# Patient Record
Sex: Male | Born: 1950
Health system: Southern US, Community
[De-identification: ages and names within clinical notes are randomized; demographics above are authoritative.]

## PROBLEM LIST (undated history)

## (undated) DIAGNOSIS — E785 Hyperlipidemia, unspecified: Secondary | ICD-10-CM

## (undated) DIAGNOSIS — K219 Gastro-esophageal reflux disease without esophagitis: Secondary | ICD-10-CM

## (undated) DIAGNOSIS — H409 Unspecified glaucoma: Secondary | ICD-10-CM

## (undated) DIAGNOSIS — E6609 Other obesity due to excess calories: Secondary | ICD-10-CM

## (undated) DIAGNOSIS — Z923 Personal history of irradiation: Secondary | ICD-10-CM

## (undated) DIAGNOSIS — C9000 Multiple myeloma not having achieved remission: Secondary | ICD-10-CM

## (undated) DIAGNOSIS — R7303 Prediabetes: Secondary | ICD-10-CM

## (undated) DIAGNOSIS — L509 Urticaria, unspecified: Secondary | ICD-10-CM

## (undated) DIAGNOSIS — T783XXA Angioneurotic edema, initial encounter: Secondary | ICD-10-CM

## (undated) DIAGNOSIS — Z9989 Dependence on other enabling machines and devices: Secondary | ICD-10-CM

## (undated) HISTORY — DX: Other obesity due to excess calories: E66.09

## (undated) HISTORY — DX: Hyperlipidemia, unspecified: E78.5

## (undated) HISTORY — DX: Personal history of irradiation: Z92.3

## (undated) HISTORY — DX: Urticaria, unspecified: L50.9

## (undated) HISTORY — PX: PORTACATH PLACEMENT: SHX2246

## (undated) HISTORY — DX: Prediabetes: R73.03

## (undated) HISTORY — DX: Gastro-esophageal reflux disease without esophagitis: K21.9

## (undated) HISTORY — DX: Angioneurotic edema, initial encounter: T78.3XXA

## (undated) HISTORY — DX: Unspecified glaucoma: H40.9

## (undated) HISTORY — DX: Multiple myeloma not having achieved remission: C90.00

---

## 2010-10-17 DIAGNOSIS — C9000 Multiple myeloma not having achieved remission: Secondary | ICD-10-CM

## 2010-10-17 HISTORY — DX: Multiple myeloma not having achieved remission: C90.00

## 2016-04-04 DIAGNOSIS — E7800 Pure hypercholesterolemia, unspecified: Secondary | ICD-10-CM | POA: Diagnosis not present

## 2016-04-04 DIAGNOSIS — H409 Unspecified glaucoma: Secondary | ICD-10-CM | POA: Diagnosis not present

## 2016-04-04 DIAGNOSIS — R7303 Prediabetes: Secondary | ICD-10-CM | POA: Diagnosis not present

## 2016-04-04 DIAGNOSIS — R079 Chest pain, unspecified: Secondary | ICD-10-CM | POA: Diagnosis not present

## 2016-04-04 DIAGNOSIS — E6609 Other obesity due to excess calories: Secondary | ICD-10-CM | POA: Diagnosis not present

## 2016-04-04 DIAGNOSIS — T783XXD Angioneurotic edema, subsequent encounter: Secondary | ICD-10-CM | POA: Diagnosis not present

## 2016-04-04 DIAGNOSIS — Z6834 Body mass index (BMI) 34.0-34.9, adult: Secondary | ICD-10-CM | POA: Diagnosis not present

## 2016-04-04 DIAGNOSIS — L509 Urticaria, unspecified: Secondary | ICD-10-CM | POA: Diagnosis not present

## 2016-04-04 DIAGNOSIS — K219 Gastro-esophageal reflux disease without esophagitis: Secondary | ICD-10-CM | POA: Diagnosis not present

## 2016-04-04 DIAGNOSIS — C9000 Multiple myeloma not having achieved remission: Secondary | ICD-10-CM | POA: Diagnosis not present

## 2016-04-04 DIAGNOSIS — R197 Diarrhea, unspecified: Secondary | ICD-10-CM | POA: Diagnosis not present

## 2016-05-02 DIAGNOSIS — Z86010 Personal history of colonic polyps: Secondary | ICD-10-CM | POA: Diagnosis not present

## 2016-05-02 DIAGNOSIS — R194 Change in bowel habit: Secondary | ICD-10-CM | POA: Diagnosis not present

## 2016-05-11 ENCOUNTER — Encounter: Payer: Self-pay | Admitting: Cardiovascular Disease

## 2016-05-11 ENCOUNTER — Encounter (INDEPENDENT_AMBULATORY_CARE_PROVIDER_SITE_OTHER): Payer: Self-pay

## 2016-05-11 ENCOUNTER — Ambulatory Visit (INDEPENDENT_AMBULATORY_CARE_PROVIDER_SITE_OTHER): Payer: Medicare Other | Admitting: Cardiovascular Disease

## 2016-05-11 VITALS — BP 118/76 | HR 79 | Ht 73.0 in | Wt 262.4 lb

## 2016-05-11 DIAGNOSIS — R072 Precordial pain: Secondary | ICD-10-CM | POA: Diagnosis not present

## 2016-05-11 DIAGNOSIS — R011 Cardiac murmur, unspecified: Secondary | ICD-10-CM

## 2016-05-11 NOTE — Patient Instructions (Signed)
Medication Instructions:  Your physician recommends that you continue on your current medications as directed. Please refer to the Current Medication list given to you today.   Labwork: none  Testing/Procedures: Your physician has requested that you have an exercise tolerance test. For further information please visit [email or web address]. Please also follow instruction sheet, as given.  Your physician has requested that you have an echocardiogram. Echocardiography is a painless test that uses sound waves to create images of your heart. It provides your doctor with information about the size and shape of your heart and how well your heart's chambers and valves are working. This procedure takes approximately one hour. There are no restrictions for this procedure.    Follow-Up: Your physician wants you to follow-up in: 12 months.  You will receive a reminder letter in the mail two months in advance. If you don't receive a letter, please call our office to schedule the follow-up appointment.   Any Other Special Instructions Will Be Listed Below (If Applicable).     If you need a refill on your cardiac medications before your next appointment, please call your pharmacy.

## 2016-05-11 NOTE — Progress Notes (Signed)
Chief Complaint  Patient presents with  . Follow-up    pt states some chest pain no SOB     History of Present Illness: Michael [name] is a 65 yo male with history of GERD, HLD, pre-diabetes and multiple myeloma who is here today as a new patient for evaluation of chest pain. He has no known cardiac disease. He was diagnosed with Multiple myeloma in 2012 and was treated at the [facility]. Follow up PET scans with sternal lesion. He describes substernal chest pains over the last few months. This was worsened with movement of his arms and when bending over. This was not clearly exertional. It has resolved. He had been under much stress with his move from [place] to [place] recently. He is now retired. He is a non-smoker. He does not exercise.  His chest pain had no associated dyspnea, palpitations, dizziness, diaphoresis.   Primary Care Physician: [name]   Past Medical History:  Diagnosis Date  . Angioedema   . Gastroesophageal reflux disease without esophagitis   . Hyperlipidemia   . Multiple myeloma ([place]) 2012   Treated at [facility]  . Non morbid obesity due to excess calories   . Prediabetes   . Prediabetes   . Unspecified glaucoma   . Urticaria, unspecified     Past Surgical History:  Procedure Laterality Date  . PORTACATH PLACEMENT      Current Outpatient Prescriptions  Medication Sig Dispense Refill  . aspirin 81 MG tablet Take 81 mg by mouth daily.    [name] atorvastatin (LIPITOR) 20 MG tablet Take 20 mg by mouth daily.    . Calcium Carbonate-Vitamin D (CALCIUM-D) 600-400 MG-UNIT TABS Take by mouth 2 (two) times daily.    . pantoprazole (PROTONIX) 40 MG tablet Take 40 mg by mouth daily.     No current facility-administered medications for this visit.     No Known Allergies  Social History   Social History  . Marital status: Married    Spouse name: N/A  . Number of children: 2  . Years of education: N/A   Occupational History  . Retired Chief Strategy Officer for [employer]  . Smoking status: Never Smoker  . Smokeless tobacco: Not on file  . Alcohol use 0.0 oz/week     Comment: Occasional beer  . Drug use: No  . Sexual activity: Not on file   Other Topics Concern  . Not on file   Social History Narrative  . No narrative on file    Family History  Problem Relation Age of Onset  . Cancer Father     Review of Systems:  As stated in the HPI and otherwise negative.   BP 118/76 (BP Location: Right Arm, Patient Position: Sitting, Cuff Size: Normal)   Pulse 79   Ht _0  (1.854 m)   Wt 262 lb 6.4 oz (119 kg)   BMI 34.62 kg/m   Physical Examination: General: Well developed, well nourished, NAD  HEENT: OP clear, mucus membranes moist  SKIN: warm, dry. No rashes. Neuro: No focal deficits  Musculoskeletal: Muscle strength 5/5 all ext  Psychiatric: Mood and affect normal  Neck: No JVD, no carotid bruits, no thyromegaly, no lymphadenopathy.  Lungs:Clear bilaterally, no wheezes, rhonci, crackles Cardiovascular: Regular rate and rhythm with soft systolic murmurs. No gallops or rubs. Abdomen:Soft. Bowel sounds present. Non-tender.  Extremities: No lower extremity edema. Pulses are 2 + in the bilateral DP/PT.  EKG:  EKG is not ordered  today. The ekg ordered today demonstrates   Recent Labs: No results found for requested labs within last 8760 hours.   Lipid Panel No results found for: CHOL, TRIG, HDL, CHOLHDL, VLDL, LDLCALC, LDLDIRECT   Wt Readings from Last 3 Encounters:  05/11/16 262 lb 6.4 oz (119 kg)     Other studies Reviewed: Additional studies/ records that were reviewed today include: . Review of the above records demonstrates:     Assessment and Plan:   1. Chest pain: His pain sounds musculoskeletal and has resolved. Given risk factors for CAD including age, pre-diabetes, HLD, will plan exercise treadmill stress test to assess for ischemia.   2. Cardiac murmur: Will arrange echo to assess, exclude valvular  disease.   Current medicines are reviewed at length with the patient today.  The patient does not have concerns regarding medicines.  The following changes have been made:  no change  Labs/ tests ordered today include:  No orders of the defined types were placed in this encounter.    Disposition:   FU with me in 12 months   Signed, Lauree Chandler, MD 05/11/2016 10:08 AM    [facility] [address], [place], [place]  [address] Phone: [phone]; Fax: [phone]

## 2016-05-25 ENCOUNTER — Encounter: Payer: Self-pay | Admitting: Cardiovascular Disease

## 2016-05-26 ENCOUNTER — Ambulatory Visit (INDEPENDENT_AMBULATORY_CARE_PROVIDER_SITE_OTHER): Payer: Medicare Other

## 2016-05-26 ENCOUNTER — Ambulatory Visit (HOSPITAL_COMMUNITY): Payer: Medicare Other | Attending: Cardiovascular Disease

## 2016-05-26 ENCOUNTER — Other Ambulatory Visit: Payer: Self-pay

## 2016-05-26 DIAGNOSIS — E669 Obesity, unspecified: Secondary | ICD-10-CM | POA: Insufficient documentation

## 2016-05-26 DIAGNOSIS — R011 Cardiac murmur, unspecified: Secondary | ICD-10-CM | POA: Diagnosis not present

## 2016-05-26 DIAGNOSIS — I517 Cardiomegaly: Secondary | ICD-10-CM | POA: Diagnosis not present

## 2016-05-26 DIAGNOSIS — R072 Precordial pain: Secondary | ICD-10-CM | POA: Diagnosis not present

## 2016-05-26 DIAGNOSIS — E785 Hyperlipidemia, unspecified: Secondary | ICD-10-CM | POA: Insufficient documentation

## 2016-05-26 DIAGNOSIS — Z6834 Body mass index (BMI) 34.0-34.9, adult: Secondary | ICD-10-CM | POA: Diagnosis not present

## 2016-05-26 DIAGNOSIS — I351 Nonrheumatic aortic (valve) insufficiency: Secondary | ICD-10-CM | POA: Diagnosis not present

## 2016-05-26 LAB — EXERCISE TOLERANCE TEST
CSEPED: 6 min
CSEPPHR: 136 {beats}/min
Estimated workload: 7 METS
Exercise duration (sec): 0 s
MPHR: 154 {beats}/min
Percent HR: 88 %
RPE: 18
Rest HR: 64 {beats}/min

## 2016-06-06 DIAGNOSIS — R194 Change in bowel habit: Secondary | ICD-10-CM | POA: Diagnosis not present

## 2016-06-06 DIAGNOSIS — D125 Benign neoplasm of sigmoid colon: Secondary | ICD-10-CM | POA: Diagnosis not present

## 2016-06-06 DIAGNOSIS — K644 Residual hemorrhoidal skin tags: Secondary | ICD-10-CM | POA: Diagnosis not present

## 2016-06-06 DIAGNOSIS — Z86010 Personal history of colonic polyps: Secondary | ICD-10-CM | POA: Diagnosis not present

## 2016-06-06 DIAGNOSIS — K5730 Diverticulosis of large intestine without perforation or abscess without bleeding: Secondary | ICD-10-CM | POA: Diagnosis not present

## 2016-06-06 DIAGNOSIS — D122 Benign neoplasm of ascending colon: Secondary | ICD-10-CM | POA: Diagnosis not present

## 2016-06-06 DIAGNOSIS — R197 Diarrhea, unspecified: Secondary | ICD-10-CM | POA: Diagnosis not present

## 2016-06-06 DIAGNOSIS — D126 Benign neoplasm of colon, unspecified: Secondary | ICD-10-CM | POA: Diagnosis not present

## 2016-07-06 DIAGNOSIS — R7303 Prediabetes: Secondary | ICD-10-CM | POA: Diagnosis not present

## 2016-07-06 DIAGNOSIS — E7800 Pure hypercholesterolemia, unspecified: Secondary | ICD-10-CM | POA: Diagnosis not present

## 2016-07-06 DIAGNOSIS — Z23 Encounter for immunization: Secondary | ICD-10-CM | POA: Diagnosis not present

## 2016-07-06 DIAGNOSIS — Z1159 Encounter for screening for other viral diseases: Secondary | ICD-10-CM | POA: Diagnosis not present

## 2016-07-06 DIAGNOSIS — K219 Gastro-esophageal reflux disease without esophagitis: Secondary | ICD-10-CM | POA: Diagnosis not present

## 2016-07-06 DIAGNOSIS — R748 Abnormal levels of other serum enzymes: Secondary | ICD-10-CM | POA: Diagnosis not present

## 2016-07-06 DIAGNOSIS — Z86010 Personal history of colonic polyps: Secondary | ICD-10-CM | POA: Diagnosis not present

## 2016-07-06 DIAGNOSIS — E6609 Other obesity due to excess calories: Secondary | ICD-10-CM | POA: Diagnosis not present

## 2016-07-06 DIAGNOSIS — C9000 Multiple myeloma not having achieved remission: Secondary | ICD-10-CM | POA: Diagnosis not present

## 2016-07-06 DIAGNOSIS — Z6836 Body mass index (BMI) 36.0-36.9, adult: Secondary | ICD-10-CM | POA: Diagnosis not present

## 2016-07-18 DIAGNOSIS — M25562 Pain in left knee: Secondary | ICD-10-CM | POA: Diagnosis not present

## 2016-07-18 DIAGNOSIS — M25561 Pain in right knee: Secondary | ICD-10-CM | POA: Diagnosis not present

## 2016-07-18 DIAGNOSIS — M170 Bilateral primary osteoarthritis of knee: Secondary | ICD-10-CM | POA: Diagnosis not present

## 2016-07-18 DIAGNOSIS — R262 Difficulty in walking, not elsewhere classified: Secondary | ICD-10-CM | POA: Diagnosis not present

## 2016-07-21 DIAGNOSIS — M170 Bilateral primary osteoarthritis of knee: Secondary | ICD-10-CM | POA: Diagnosis not present

## 2016-07-21 DIAGNOSIS — R262 Difficulty in walking, not elsewhere classified: Secondary | ICD-10-CM | POA: Diagnosis not present

## 2016-07-21 DIAGNOSIS — M25561 Pain in right knee: Secondary | ICD-10-CM | POA: Diagnosis not present

## 2016-07-21 DIAGNOSIS — M25562 Pain in left knee: Secondary | ICD-10-CM | POA: Diagnosis not present

## 2016-07-26 DIAGNOSIS — M25561 Pain in right knee: Secondary | ICD-10-CM | POA: Diagnosis not present

## 2016-07-26 DIAGNOSIS — M1711 Unilateral primary osteoarthritis, right knee: Secondary | ICD-10-CM | POA: Diagnosis not present

## 2016-07-28 DIAGNOSIS — M1712 Unilateral primary osteoarthritis, left knee: Secondary | ICD-10-CM | POA: Diagnosis not present

## 2016-07-28 DIAGNOSIS — M25562 Pain in left knee: Secondary | ICD-10-CM | POA: Diagnosis not present

## 2016-08-02 DIAGNOSIS — M1711 Unilateral primary osteoarthritis, right knee: Secondary | ICD-10-CM | POA: Diagnosis not present

## 2016-08-02 DIAGNOSIS — M25561 Pain in right knee: Secondary | ICD-10-CM | POA: Diagnosis not present

## 2016-08-04 DIAGNOSIS — M25562 Pain in left knee: Secondary | ICD-10-CM | POA: Diagnosis not present

## 2016-08-04 DIAGNOSIS — M1712 Unilateral primary osteoarthritis, left knee: Secondary | ICD-10-CM | POA: Diagnosis not present

## 2016-08-09 DIAGNOSIS — M1711 Unilateral primary osteoarthritis, right knee: Secondary | ICD-10-CM | POA: Diagnosis not present

## 2016-08-09 DIAGNOSIS — M25561 Pain in right knee: Secondary | ICD-10-CM | POA: Diagnosis not present

## 2016-08-11 DIAGNOSIS — M1712 Unilateral primary osteoarthritis, left knee: Secondary | ICD-10-CM | POA: Diagnosis not present

## 2016-08-11 DIAGNOSIS — M25562 Pain in left knee: Secondary | ICD-10-CM | POA: Diagnosis not present

## 2016-08-16 DIAGNOSIS — M170 Bilateral primary osteoarthritis of knee: Secondary | ICD-10-CM | POA: Diagnosis not present

## 2016-08-16 DIAGNOSIS — M25561 Pain in right knee: Secondary | ICD-10-CM | POA: Diagnosis not present

## 2016-08-16 DIAGNOSIS — M25562 Pain in left knee: Secondary | ICD-10-CM | POA: Diagnosis not present

## 2016-10-12 DIAGNOSIS — R7303 Prediabetes: Secondary | ICD-10-CM | POA: Diagnosis not present

## 2016-12-01 DIAGNOSIS — S0101XA Laceration without foreign body of scalp, initial encounter: Secondary | ICD-10-CM | POA: Diagnosis not present

## 2016-12-05 DIAGNOSIS — S0101XD Laceration without foreign body of scalp, subsequent encounter: Secondary | ICD-10-CM | POA: Diagnosis not present

## 2016-12-05 DIAGNOSIS — W19XXXD Unspecified fall, subsequent encounter: Secondary | ICD-10-CM | POA: Diagnosis not present

## 2016-12-12 DIAGNOSIS — W19XXXA Unspecified fall, initial encounter: Secondary | ICD-10-CM | POA: Diagnosis not present

## 2016-12-12 DIAGNOSIS — C9000 Multiple myeloma not having achieved remission: Secondary | ICD-10-CM | POA: Diagnosis not present

## 2016-12-12 DIAGNOSIS — S0101XA Laceration without foreign body of scalp, initial encounter: Secondary | ICD-10-CM | POA: Diagnosis not present

## 2016-12-15 DIAGNOSIS — M170 Bilateral primary osteoarthritis of knee: Secondary | ICD-10-CM | POA: Diagnosis not present

## 2016-12-15 DIAGNOSIS — M25562 Pain in left knee: Secondary | ICD-10-CM | POA: Diagnosis not present

## 2016-12-15 DIAGNOSIS — M25561 Pain in right knee: Secondary | ICD-10-CM | POA: Diagnosis not present

## 2016-12-22 DIAGNOSIS — G4733 Obstructive sleep apnea (adult) (pediatric): Secondary | ICD-10-CM | POA: Diagnosis not present

## 2017-01-10 ENCOUNTER — Telehealth: Payer: Self-pay | Admitting: Hematology

## 2017-01-10 NOTE — Telephone Encounter (Signed)
Pt has been scheduled to see Dr. Irene Limbo on 4/16 at 1230pm. Pt brought his records to me this afternoon. He was dx w/myeloma and states that he's currently in remission. I advised him to hold onto the CDs w/the images and bring them to his appt. I will copy the pt's records in order for them to be scanned into his chart and give back the originals. Pt aware to arrive 15 to 30 minutes early. Demographic information was verified.

## 2017-01-12 DIAGNOSIS — H401131 Primary open-angle glaucoma, bilateral, mild stage: Secondary | ICD-10-CM | POA: Diagnosis not present

## 2017-01-30 ENCOUNTER — Encounter: Payer: Self-pay | Admitting: Hematology

## 2017-01-30 ENCOUNTER — Ambulatory Visit (HOSPITAL_BASED_OUTPATIENT_CLINIC_OR_DEPARTMENT_OTHER): Payer: Medicare Other | Admitting: Hematology

## 2017-01-30 DIAGNOSIS — Z809 Family history of malignant neoplasm, unspecified: Secondary | ICD-10-CM | POA: Diagnosis not present

## 2017-01-30 DIAGNOSIS — C9000 Multiple myeloma not having achieved remission: Secondary | ICD-10-CM

## 2017-02-01 NOTE — Progress Notes (Signed)
[name]    HEMATOLOGY/ONCOLOGY CONSULTATION NOTE  Date of Service: 02/01/2017  Patient Care Team: [name], MD as PCP - General (Family Medicine) [name] MD ([facility] _ primary oncology) phone number [phone] Email- [email or web address]  CHIEF COMPLAINTS/PURPOSE OF CONSULTATION:  Establish local oncology care for followup of multiple myeloma.   HISTORY OF PRESENTING ILLNESS:   Michael [name] is a wonderful 66 y.o. male , retired Nurse, learning disability guard who has been referred to [place] by Dr .Tawanna Solo, MD for establishing local oncology care "In case needed for treatment/management of complications pertaining to myeloma"  Patient has a history of multiple myeloma and is currently being followed [facility] by Dr.[name] MD who is his primary oncologist. Patient is currently on a study protocol and is being monitored for myeloma recurrence.  Based on available records being put together  -Patient was diagnosed with IgG kappa ISS stage I multiple myeloma with hyperdiploid complex cytogenetics in 2012. Bone marrow biopsy apparently showed 50% kappa restricted plasma cells with an initial M spike of 3.4 g/dL with evidence of lytic lesions on his PET/CT scan including right rib fracture and lesions of the lumbar and cervical spine. -Patient was treated under protocol 11-C-0221 with from 02/21/2013 with  Carfilzomib/Revlimid/Dexamethasone for a total of 8 cycles. He reports that his stem cells were collected and stored after 4 cycles -Posttreatment bone marrow biopsy showed less than 5% plasma cells with a negative flow cytometry and improvement in his previously PET avid lesions. Some concern for new focus of activity at C2 lytic lesion at L4 and several small lesions throughout the vertebrae. -Patient was on maintenance lenalidomide 10 mg by mouth daily for 2 years or more until end of 2014. 08/19/2013 - bone marrow biopsy showed normocellular marrow with less  than 5% plasma cells. PET CT scan showed decreased focal metabolic ligament prior rib lesions and no new lesions. Flow cytometry was negative for residual disease. Serum and urine electrophoresis and IFE showed no evidence of monoclonal protein. -Patient notes that he was off protocol for a few years due to lack of clinical trial research funding. -Patient notes that he is back on a follow-up protocol and continues to follow and receive his primary treatment at [facility] at this time. -Patient reports he had his last PET CT scan blood and urine tests and bone marrow examination in February 2018. The results of which are not available to [place] currently.  He notes that there was concern for a very slowly growing sternal FDG avid lesion that has been present for years. He has a follow-up at [facility] next month to determine the management of this lesion. He reports that was some consideration of considering radiation. he notes that this lesion has not been biopsied. No other focal bone pains at this time.  We discussed in detail about what our role will be and noted that we shall be available for any acute issues that need to be taken care of locally but that at this time the primary treatment and evaluation will be driven by his team at [facility] as per their research protocols and input. The patient and his wife are clearly aware of this and are in agreement with this plan.    MEDICAL HISTORY:  Past Medical History:  Diagnosis Date  . Angioedema   . Gastroesophageal reflux disease without esophagitis   . Hyperlipidemia   . Multiple myeloma ([place]) 2012   Treated at [facility]  . Non morbid obesity due to excess  calories   . Prediabetes   . Prediabetes   . Unspecified glaucoma(365.9)   . Urticaria, unspecified   Diabetes mellitus Glaucoma Left toes neuropathy GERD  SURGICAL HISTORY: Past Surgical History:  Procedure Laterality Date  . PORTACATH PLACEMENT      SOCIAL HISTORY: Social History    Social History  . Marital status: Married    Spouse name: N/A  . Number of children: 2  . Years of education: N/A   Occupational History  . 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 Smoking status: Never Smoker  . Smokeless tobacco: Never Used  . Alcohol use 0.0 oz/week     Comment: Occasional beer  . Drug use: No  . Sexual activity: Not on file   Other Topics Concern  . Not on file   Social History Narrative  . No narrative on file  Never smoker Married Retired from the [employer] in June 2017 and moved to [facility].  FAMILY HISTORY: Family History  Problem Relation Age of Onset  . Cancer Father     ALLERGIES:  has No Known Allergies.  MEDICATIONS:  Current Outpatient Prescriptions  Medication Sig Dispense Refill  . aspirin 81 MG tablet Take 81 mg by mouth daily.    [name] atorvastatin (LIPITOR) 20 MG tablet Take 20 mg by mouth daily.    . Calcium Carbonate-Vitamin D (CALCIUM-D) 600-400 MG-UNIT TABS Take by mouth 2 (two) times daily.    . pantoprazole (PROTONIX) 40 MG tablet Take 40 mg by mouth daily.     No current facility-administered medications for this visit.     REVIEW OF SYSTEMS:    10 Point review of Systems was done is negative except as noted above.  PHYSICAL EXAMINATION: ECOG PERFORMANCE STATUS: 1 - Symptomatic but completely ambulatory Vital signs reviewed in EPIC GENERAL:alert, in no acute distress and comfortable SKIN: no acute rashes, no significant lesions EYES: conjunctiva are pink and non-injected, sclera anicteric OROPHARYNX: MMM, no exudates, no oropharyngeal erythema or ulceration NECK: supple, no JVD LYMPH:  no palpable lymphadenopathy in the cervical, axillary or inguinal regions LUNGS: clear to auscultation b/l with normal respiratory effort HEART: regular rate & rhythm ABDOMEN:  Obese, normoactive bowel sounds , non tender, not distended. No palpable hepatosplenomegaly Extremity: no pedal  edema PSYCH: alert & oriented x 3 with fluent speech NEURO: no focal motor/sensory deficits  LABORATORY DATA:  I have reviewed the data as listed  .No flowsheet data found.  .No flowsheet data found.   RADIOGRAPHIC STUDIES: I have personally reviewed the radiological images as listed and agreed with the findings in the report. No results found.  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 Bone marrow biopsy apparently showed 50% kappa restricted plasma cells with an initial M spike of 3.4 g/dL with evidence of lytic lesions on his PET/CT scan including right rib fracture and lesions of the lumbar and cervical spine. -Patient was treated under protocol 11-C-0221 with from 02/21/2013 with  Carfilzomib/Revlimid/Dexamethasone for a total of 8 cycles. He reports that his stem cells were collected and stored after 4 cycles -Posttreatment bone marrow biopsy showed less than 5% plasma cells with a negative flow cytometry and improvement in his previously PET avid lesions. Some concern for new focus of activity at C2 lytic lesion  at L4 and several small lesions throughout the vertebrae. -Patient was on maintenance lenalidomide 10 mg by mouth daily for 2 years or more until end of 2014. 08/19/2013 - bone marrow biopsy showed normocellular marrow with less than 5% plasma cells. PET CT scan showed decreased focal metabolic ligament prior rib lesions and no new lesions. Flow cytometry was negative for residual disease. Serum and urine electrophoresis and IFE showed no evidence of monoclonal protein. -Patient notes that he was off protocol for a few years due to lack of clinical trial research funding. -Patient notes that he is back on a follow-up protocol and continues to follow and receive his primary treatment at [facility] at this  time.  Plan -Patient reports he had his last PET CT scan blood and urine tests and bone marrow examination in February 2018. The results of which are not available to [place] currently. I have asked by RN to try to obtained these results.  -He notes that there was concern for a very slowly growing sternal FDG avid lesion that has been present for years. He has a follow-up at [facility] next month to determine the management of this lesion. He reports that was some consideration of considering radiation. he notes that this lesion has not been biopsied. No other focal bone pains at this time.  -We discussed in detail about what our role will be and noted that we shall be available for any acute issues that need to be taken care of locally but that at this time the primary treatment and evaluation will be driven by his team at [facility] as per their research protocols and input. The patient and his wife are clearly aware of this and are in agreement with this plan. They are aware that they would need to inform me/our clinic if they decide to pursue active treatment locally off proctocol.  -we will try to get in touch with his primary oncologist at [facility]  [facility] with Dr Irene Limbo on an as needed basis Primary oncologic cares to continue at [facility] with Dr Jaclyn Prime MD  All of the patients questions were answered with apparent satisfaction. The patient knows to call the clinic with any problems, questions or concerns.  I spent 55 minutes counseling the patient face to face. The total time spent in the appointment was 65 minutes and more than 50% was on counseling and direct patient cares.    Sullivan Lone MD [place] AAHIVMS [facility] Hematology/Oncology Physician [facility]  (Office):       [phone] (Work cell):  [phone] (Fax):           [phone]  .01/30/2017

## 2017-03-06 ENCOUNTER — Other Ambulatory Visit: Payer: Self-pay | Admitting: Hematology

## 2017-03-06 DIAGNOSIS — C9000 Multiple myeloma not having achieved remission: Secondary | ICD-10-CM

## 2017-03-07 ENCOUNTER — Telehealth: Payer: Self-pay | Admitting: Hematology

## 2017-03-07 NOTE — Telephone Encounter (Signed)
Scheduled appt per sch message from RN loren - patient aware of appt time and date.

## 2017-03-15 DIAGNOSIS — R7303 Prediabetes: Secondary | ICD-10-CM | POA: Diagnosis not present

## 2017-03-15 DIAGNOSIS — E7800 Pure hypercholesterolemia, unspecified: Secondary | ICD-10-CM | POA: Diagnosis not present

## 2017-03-20 ENCOUNTER — Other Ambulatory Visit (HOSPITAL_BASED_OUTPATIENT_CLINIC_OR_DEPARTMENT_OTHER): Payer: Medicare Other

## 2017-03-20 DIAGNOSIS — Z6834 Body mass index (BMI) 34.0-34.9, adult: Secondary | ICD-10-CM | POA: Diagnosis not present

## 2017-03-20 DIAGNOSIS — G4733 Obstructive sleep apnea (adult) (pediatric): Secondary | ICD-10-CM | POA: Diagnosis not present

## 2017-03-20 DIAGNOSIS — E7800 Pure hypercholesterolemia, unspecified: Secondary | ICD-10-CM | POA: Diagnosis not present

## 2017-03-20 DIAGNOSIS — C9000 Multiple myeloma not having achieved remission: Secondary | ICD-10-CM | POA: Diagnosis not present

## 2017-03-20 DIAGNOSIS — E6609 Other obesity due to excess calories: Secondary | ICD-10-CM | POA: Diagnosis not present

## 2017-03-20 DIAGNOSIS — E119 Type 2 diabetes mellitus without complications: Secondary | ICD-10-CM | POA: Diagnosis not present

## 2017-03-20 DIAGNOSIS — L509 Urticaria, unspecified: Secondary | ICD-10-CM | POA: Diagnosis not present

## 2017-03-20 DIAGNOSIS — Z23 Encounter for immunization: Secondary | ICD-10-CM | POA: Diagnosis not present

## 2017-03-20 DIAGNOSIS — K219 Gastro-esophageal reflux disease without esophagitis: Secondary | ICD-10-CM | POA: Diagnosis not present

## 2017-03-20 LAB — COMPREHENSIVE METABOLIC PANEL
ALBUMIN: 4 g/dL (ref 3.5–5.0)
ALK PHOS: 79 U/L (ref 40–150)
ALT: 24 U/L (ref 0–55)
ANION GAP: 8 meq/L (ref 3–11)
AST: 17 U/L (ref 5–34)
BUN: 14.3 mg/dL (ref 7.0–26.0)
CALCIUM: 9.3 mg/dL (ref 8.4–10.4)
CHLORIDE: 106 meq/L (ref 98–109)
CO2: 27 mEq/L (ref 22–29)
CREATININE: 0.8 mg/dL (ref 0.7–1.3)
EGFR: >90 mL/min/{1.73_m2} (ref >90)
Glucose: 117 mg/dl (ref 70–140)
POTASSIUM: 4.1 meq/L (ref 3.5–5.1)
Sodium: 141 mEq/L (ref 136–145)
Total Bilirubin: 1 mg/dL (ref 0.20–1.20)
Total Protein: 6.6 g/dL (ref 6.4–8.3)

## 2017-03-20 LAB — CBC & DIFF AND RETIC
BASO%: 0.5 % (ref 0.0–2.0)
Basophils Absolute: 0 10*3/uL (ref 0.0–0.1)
EOS ABS: 0.1 10*3/uL (ref 0.0–0.5)
EOS%: 2 % (ref 0.0–7.0)
HEMATOCRIT: 40.2 % (ref 38.4–49.9)
HEMOGLOBIN: 13.6 g/dL (ref 13.0–17.1)
Immature Retic Fract: 2.3 % — ABNORMAL LOW (ref 3.00–10.60)
LYMPH%: 32.7 % (ref 14.0–49.0)
MCH: 30.8 pg (ref 27.2–33.4)
MCHC: 33.8 g/dL (ref 32.0–36.0)
MCV: 91 fL (ref 79.3–98.0)
MONO#: 0.4 10*3/uL (ref 0.1–0.9)
MONO%: 9.7 % (ref 0.0–14.0)
NEUT#: 2.2 10*3/uL (ref 1.5–6.5)
NEUT%: 55.1 % (ref 39.0–75.0)
PLATELETS: 161 10*3/uL (ref 140–400)
RBC: 4.42 10*6/uL (ref 4.20–5.82)
RDW: 13.4 % (ref 11.0–14.6)
Retic %: 1.11 % (ref 0.80–1.80)
Retic Ct Abs: 49.06 10*3/uL (ref 34.80–93.90)
WBC: 3.9 10*3/uL — ABNORMAL LOW (ref 4.0–10.3)
lymph#: 1.3 10*3/uL (ref 0.9–3.3)

## 2017-03-20 LAB — LACTATE DEHYDROGENASE: LDH: 170 U/L (ref 125–245)

## 2017-03-21 ENCOUNTER — Other Ambulatory Visit: Payer: Medicare Other

## 2017-03-21 LAB — KAPPA/LAMBDA LIGHT CHAINS
Ig Kappa Free Light Chain: 13.2 mg/L (ref 3.3–19.4)
Ig Lambda Free Light Chain: 7 mg/L (ref 5.7–26.3)
Kappa/Lambda FluidC Ratio: 1.89 — ABNORMAL HIGH (ref 0.26–1.65)

## 2017-03-21 LAB — BETA 2 MICROGLOBULIN, SERUM: BETA 2: 1.4 mg/L (ref 0.6–2.4)

## 2017-03-22 LAB — MULTIPLE MYELOMA PANEL, SERUM
ALBUMIN SERPL ELPH-MCNC: 3.7 g/dL (ref 2.9–4.4)
ALPHA 1: 0.2 g/dL (ref 0.0–0.4)
Albumin/Glob SerPl: 1.5 (ref 0.7–1.7)
Alpha2 Glob SerPl Elph-Mcnc: 0.6 g/dL (ref 0.4–1.0)
B-Globulin SerPl Elph-Mcnc: 1 g/dL (ref 0.7–1.3)
GLOBULIN, TOTAL: 2.6 g/dL (ref 2.2–3.9)
Gamma Glob SerPl Elph-Mcnc: 0.8 g/dL (ref 0.4–1.8)
IGA/IMMUNOGLOBULIN A, SERUM: 112 mg/dL (ref 61–437)
IGM (IMMUNOGLOBIN M), SRM: 36 mg/dL (ref 20–172)
IgG, Qn, Serum: 815 mg/dL (ref 700–1600)
M Protein SerPl Elph-Mcnc: 0.3 g/dL — ABNORMAL HIGH
Total Protein: 6.3 g/dL (ref 6.0–8.5)

## 2017-03-28 ENCOUNTER — Telehealth: Payer: Self-pay | Admitting: Hematology

## 2017-03-28 ENCOUNTER — Ambulatory Visit (HOSPITAL_BASED_OUTPATIENT_CLINIC_OR_DEPARTMENT_OTHER): Payer: Medicare Other | Admitting: Hematology

## 2017-03-28 ENCOUNTER — Other Ambulatory Visit: Payer: Medicare Other

## 2017-03-28 ENCOUNTER — Other Ambulatory Visit: Payer: Self-pay | Admitting: *Deleted

## 2017-03-28 ENCOUNTER — Encounter: Payer: Self-pay | Admitting: Hematology

## 2017-03-28 VITALS — BP 128/68 | HR 63 | Temp 98.4°F | Resp 18 | Ht 73.0 in | Wt 257.7 lb

## 2017-03-28 DIAGNOSIS — M170 Bilateral primary osteoarthritis of knee: Secondary | ICD-10-CM | POA: Diagnosis not present

## 2017-03-28 DIAGNOSIS — C9000 Multiple myeloma not having achieved remission: Secondary | ICD-10-CM

## 2017-03-28 DIAGNOSIS — C9002 Multiple myeloma in relapse: Secondary | ICD-10-CM

## 2017-03-28 MED ORDER — LENALIDOMIDE 10 MG PO CAPS: 10.0000 mg | ORAL_CAPSULE | Freq: Every day | ORAL | 0 refills | Status: DC

## 2017-03-28 MED ORDER — LENALIDOMIDE 10 MG PO CAPS: 10.0000 mg | ORAL_CAPSULE | Freq: Every day | ORAL | 3 refills | Status: DC

## 2017-03-28 NOTE — Telephone Encounter (Signed)
Scheduled appt per 6/12 los - called - no answer- mailed reminder letter in the mail with appt dates.

## 2017-03-28 NOTE — Patient Instructions (Signed)
Thank you for choosing [facility] to provide your oncology and hematology care.  To afford each patient quality time with our providers, please arrive 30 minutes before your scheduled appointment time.  If you arrive late for your appointment, you may be asked to reschedule.  We strive to give you quality time with our providers, and arriving late affects you and other patients whose appointments are after yours.  If you are a no show for multiple scheduled visits, you may be dismissed from the clinic at the providers discretion.   Again, thank you for choosing [facility], our hope is that these requests will decrease the amount of time that you wait before being seen by our physicians.  ______________________________________________________________________ Should you have questions after your visit to the [facility], please contact our office at [phone] between the hours of 8:30 and 4:30 p.m.    Voicemails left after 4:30p.m will not be returned until the following business day.   For prescription refill requests, please have your pharmacy contact us directly.  Please also try to allow 48 hours for prescription requests.   Please contact the scheduling department for questions regarding scheduling.  For scheduling of procedures such as PET scans, CT scans, MRI, Ultrasound, etc please contact central scheduling at [phone].   Resources For Cancer Patients and Caregivers:  American Cancer Society:  800-227-2345  Can help patients locate various types of support and financial assistance Cancer Care: 1-800-813-HOPE (4673) Provides financial assistance, online support groups, medication/co-pay assistance.   [place] DSS:  336-641-3447 Where to apply for food stamps, Medicaid, and utility assistance Medicare Rights Center: 800-333-4114 Helps people with Medicare understand their rights and benefits, navigate the Medicare system, and secure the  quality healthcare they deserve SCAT: 336-333-6589 [place] [employer]'s shared-ride transportation service for eligible riders who have a disability that prevents them from riding the fixed route bus.   For additional information on assistance programs please contact our social worker:   Grier Hock/Abigail Elmore:  [phone] 

## 2017-03-29 DIAGNOSIS — M25561 Pain in right knee: Secondary | ICD-10-CM | POA: Diagnosis not present

## 2017-03-29 DIAGNOSIS — M25562 Pain in left knee: Secondary | ICD-10-CM | POA: Diagnosis not present

## 2017-04-04 ENCOUNTER — Other Ambulatory Visit: Payer: Medicare Other

## 2017-04-04 ENCOUNTER — Ambulatory Visit: Payer: Medicare Other | Admitting: Hematology

## 2017-04-05 ENCOUNTER — Telehealth: Payer: Self-pay | Admitting: Pharmacist

## 2017-04-05 DIAGNOSIS — C9002 Multiple myeloma in relapse: Secondary | ICD-10-CM

## 2017-04-05 MED ORDER — LENALIDOMIDE 10 MG PO CAPS: 10.0000 mg | ORAL_CAPSULE | Freq: Every day | ORAL | 0 refills | Status: DC

## 2017-04-05 NOTE — Telephone Encounter (Signed)
Oral Chemotherapy Pharmacist Encounter  Received new prescription for Revlimid for the treatment of Multiple Myeloma in relapse. Labs and medication list in Epic reviewed, OK for treatment. Noted patient on thromboprophylaxis with aspirin.  Noted patient with [facility]. Prescription has been e-scribed to the [place] per insurance for best copayment.  Oral Oncology Clinic will continue to follow.  Johny Drilling, PharmD, BCPS, BCOP 04/05/2017  3:20 PM Oral Oncology Clinic [phone]

## 2017-04-07 ENCOUNTER — Telehealth: Payer: Self-pay | Admitting: *Deleted

## 2017-04-07 DIAGNOSIS — C9002 Multiple myeloma in relapse: Secondary | ICD-10-CM

## 2017-04-07 MED ORDER — LENALIDOMIDE 10 MG PO CAPS: 10.0000 mg | ORAL_CAPSULE | Freq: Every day | ORAL | 0 refills | Status: DC

## 2017-04-07 NOTE — Telephone Encounter (Signed)
FYI "[place] with Biochemist, clinical.  Following up on order for Revlimid for this patient.  [facility] received order, unable to service, sent order to [place].  We're unable to fill this order because we do not work with [employer].  Tried, learned [facility] should.  Has patient received this from another pharmacy? Do you want [place] to cancel this order?"  Instructed to cancel order.  This nurse will resend eRx to [facility].  Updated preferred pharmacy list.

## 2017-04-10 ENCOUNTER — Other Ambulatory Visit: Payer: Self-pay | Admitting: Pharmacist

## 2017-04-10 DIAGNOSIS — C9002 Multiple myeloma in relapse: Secondary | ICD-10-CM

## 2017-04-10 MED ORDER — LENALIDOMIDE 10 MG PO CAPS: 10.0000 mg | ORAL_CAPSULE | Freq: Every day | ORAL | 0 refills | Status: DC

## 2017-04-10 NOTE — Telephone Encounter (Signed)
Oral Chemotherapy Pharmacist Encounter  Received call from [facility] (owned by [employer]) that prior authorization is required for Revlimid. I called [facility] at [phone] to initiate PA PA approved Effective dates: 03/11/17-10/16/2098 Case# [id number]  Oral Oncology Clinic will continue to follow. Noted patient's prescription has been bounced around, however it should be filled through [facility] for best [facility] copayment. Prescription will be updated in Epic.  Oral Oncology Clinic will continue to follow.  Johny Drilling, PharmD, BCPS, BCOP 04/10/2017  11:15 AM Oral Oncology Clinic [phone]

## 2017-04-11 ENCOUNTER — Telehealth: Payer: Self-pay

## 2017-04-11 NOTE — Telephone Encounter (Signed)
Pt was asking when to start his revlimid, he will be getting it on Friday. Is there going to be a cycle of a week off or similar? Is he going to be taking dexamethasone?, this was a      question from the pharmacist.

## 2017-04-12 ENCOUNTER — Telehealth: Payer: Self-pay

## 2017-04-12 ENCOUNTER — Other Ambulatory Visit: Payer: Self-pay

## 2017-04-12 NOTE — Telephone Encounter (Signed)
Pt received revlimid this AM. Plan to start taking tonight. Pt is on prescription for aspirin (confirmed for MD). No dexamethasone necessary at this time, per Dr. Irene Limbo. Pt would like to know plan for toxicity checks and future doctor appt.

## 2017-04-12 NOTE — Telephone Encounter (Signed)
Spoke with pt's wife to confirm that Dr. Irene Limbo does not want time off with the revlimid. Plan to see pt for labs and doctor visit between July 18-20. Will keep zometa appt day/time the same. Wife verbalized understanding.

## 2017-04-14 ENCOUNTER — Telehealth: Payer: Self-pay | Admitting: Hematology

## 2017-04-14 NOTE — Telephone Encounter (Signed)
R/s lab and f/u appt per sch message from [facility] - patient is aware of appt date and time .

## 2017-04-21 ENCOUNTER — Telehealth: Payer: Self-pay | Admitting: Hematology

## 2017-04-21 NOTE — Telephone Encounter (Signed)
Faxed records to Dr. Jaclyn Prime [phone]

## 2017-04-25 ENCOUNTER — Ambulatory Visit (HOSPITAL_BASED_OUTPATIENT_CLINIC_OR_DEPARTMENT_OTHER): Payer: Medicare Other

## 2017-04-25 VITALS — BP 138/67 | HR 59 | Temp 98.2°F | Resp 18

## 2017-04-25 DIAGNOSIS — C9000 Multiple myeloma not having achieved remission: Secondary | ICD-10-CM

## 2017-04-25 DIAGNOSIS — C9002 Multiple myeloma in relapse: Secondary | ICD-10-CM

## 2017-04-25 MED ORDER — ZOLEDRONIC ACID 4 MG/100ML IV SOLN
4.0000 mg | Freq: Once | INTRAVENOUS | Status: AC
  Administered 2017-04-25: 4 mg via INTRAVENOUS
  Filled 2017-04-25: qty 100

## 2017-04-25 MED ORDER — SODIUM CHLORIDE 0.9 % IV SOLN
Freq: Once | INTRAVENOUS | Status: AC
  Administered 2017-04-25: 15:00:00 via INTRAVENOUS

## 2017-04-25 NOTE — Patient Instructions (Signed)

## 2017-04-26 DIAGNOSIS — M25561 Pain in right knee: Secondary | ICD-10-CM | POA: Diagnosis not present

## 2017-04-26 DIAGNOSIS — M25562 Pain in left knee: Secondary | ICD-10-CM | POA: Diagnosis not present

## 2017-04-27 ENCOUNTER — Telehealth: Payer: Self-pay

## 2017-04-27 NOTE — Telephone Encounter (Signed)
Fax received requesting prior authorization for revlimid prior to dispensing.  Fax shown to Dr. Irene Limbo and sent to [employer] for review. Confirmed fax receipt.

## 2017-05-05 ENCOUNTER — Other Ambulatory Visit: Payer: Self-pay | Admitting: *Deleted

## 2017-05-05 ENCOUNTER — Other Ambulatory Visit (HOSPITAL_BASED_OUTPATIENT_CLINIC_OR_DEPARTMENT_OTHER): Payer: Medicare Other

## 2017-05-05 ENCOUNTER — Ambulatory Visit (HOSPITAL_BASED_OUTPATIENT_CLINIC_OR_DEPARTMENT_OTHER): Payer: Medicare Other | Admitting: Hematology

## 2017-05-05 ENCOUNTER — Telehealth: Payer: Self-pay | Admitting: Hematology

## 2017-05-05 ENCOUNTER — Encounter: Payer: Self-pay | Admitting: Hematology

## 2017-05-05 VITALS — BP 132/63 | HR 60 | Temp 98.0°F | Resp 18 | Ht 73.0 in | Wt 259.1 lb

## 2017-05-05 DIAGNOSIS — C9000 Multiple myeloma not having achieved remission: Secondary | ICD-10-CM

## 2017-05-05 DIAGNOSIS — C9002 Multiple myeloma in relapse: Secondary | ICD-10-CM

## 2017-05-05 LAB — CBC WITH DIFFERENTIAL/PLATELET
BASO%: 0.9 % (ref 0.0–2.0)
BASOS ABS: 0 10*3/uL (ref 0.0–0.1)
EOS ABS: 0.2 10*3/uL (ref 0.0–0.5)
EOS%: 5.1 % (ref 0.0–7.0)
HCT: 40.7 % (ref 38.4–49.9)
HGB: 13.8 g/dL (ref 13.0–17.1)
LYMPH%: 24.7 % (ref 14.0–49.0)
MCH: 30.9 pg (ref 27.2–33.4)
MCHC: 33.9 g/dL (ref 32.0–36.0)
MCV: 91.1 fL (ref 79.3–98.0)
MONO#: 0.7 10*3/uL (ref 0.1–0.9)
MONO%: 15.9 % — AB (ref 0.0–14.0)
NEUT%: 53.4 % (ref 39.0–75.0)
NEUTROS ABS: 2.3 10*3/uL (ref 1.5–6.5)
PLATELETS: 164 10*3/uL (ref 140–400)
RBC: 4.47 10*6/uL (ref 4.20–5.82)
RDW: 14 % (ref 11.0–14.6)
WBC: 4.3 10*3/uL (ref 4.0–10.3)
lymph#: 1.1 10*3/uL (ref 0.9–3.3)

## 2017-05-05 LAB — COMPREHENSIVE METABOLIC PANEL
ALT: 23 U/L (ref 0–55)
ANION GAP: 8 meq/L (ref 3–11)
AST: 13 U/L (ref 5–34)
Albumin: 3.8 g/dL (ref 3.5–5.0)
Alkaline Phosphatase: 75 U/L (ref 40–150)
BUN: 11.1 mg/dL (ref 7.0–26.0)
CO2: 26 meq/L (ref 22–29)
Calcium: 8.8 mg/dL (ref 8.4–10.4)
Chloride: 107 mEq/L (ref 98–109)
Creatinine: 0.8 mg/dL (ref 0.7–1.3)
Glucose: 121 mg/dl (ref 70–140)
POTASSIUM: 3.8 meq/L (ref 3.5–5.1)
SODIUM: 141 meq/L (ref 136–145)
TOTAL PROTEIN: 6.7 g/dL (ref 6.4–8.3)
Total Bilirubin: 1.3 mg/dL — ABNORMAL HIGH (ref 0.20–1.20)

## 2017-05-05 MED ORDER — LENALIDOMIDE 10 MG PO CAPS: 10.0000 mg | ORAL_CAPSULE | Freq: Every day | ORAL | 0 refills | Status: DC

## 2017-05-05 NOTE — Telephone Encounter (Signed)
Spoke with wife re August appointments. Patient will get new schedule at next visit.

## 2017-05-05 NOTE — Patient Instructions (Signed)
Thank you for choosing [facility] to provide your oncology and hematology care.  To afford each patient quality time with our providers, please arrive 30 minutes before your scheduled appointment time.  If you arrive late for your appointment, you may be asked to reschedule.  We strive to give you quality time with our providers, and arriving late affects you and other patients whose appointments are after yours.   If you are a no show for multiple scheduled visits, you may be dismissed from the clinic at the providers discretion.    Again, thank you for choosing [facility], our hope is that these requests will decrease the amount of time that you wait before being seen by our physicians.  ______________________________________________________________________  Should you have questions after your visit to the [facility], please contact our office at [phone] between the hours of 8:30 and 4:30 p.m.    Voicemails left after 4:30p.m will not be returned until the following business day.    For prescription refill requests, please have your pharmacy contact us directly.  Please also try to allow 48 hours for prescription requests.    Please contact the scheduling department for questions regarding scheduling.  For scheduling of procedures such as PET scans, CT scans, MRI, Ultrasound, etc please contact central scheduling at [phone].    Resources For Cancer Patients and Caregivers:   Oncolink.org:  A wonderful resource for patients and healthcare providers for information regarding your disease, ways to tract your treatment, what to expect, etc.     American Cancer Society:  800-227-2345  Can help patients locate various types of support and financial assistance  Cancer Care: 1-800-813-HOPE (4673) Provides financial assistance, online support groups, medication/co-pay assistance.    [place] DSS:  336-641-3447 Where to apply for food  stamps, Medicaid, and utility assistance  Medicare Rights Center: 800-333-4114 Helps people with Medicare understand their rights and benefits, navigate the Medicare system, and secure the quality healthcare they deserve  SCAT: 336-333-6589 [place] [employer]'s shared-ride transportation service for eligible riders who have a disability that prevents them from riding the fixed route bus.    For additional information on assistance programs please contact our social worker:   Grier Hock/Abigail Elmore:  [phone]            

## 2017-05-07 NOTE — Progress Notes (Signed)
[name]    HEMATOLOGY/ONCOLOGY CLINIC NOTE  Date of Service: .05/05/2017  Patient Care Team: [name], MD as PCP - General (Family Medicine) [name] MD ([facility] _ primary oncology) phone number [phone] Email- [email or web address]  CHIEF COMPLAINTS/PURPOSE OF CONSULTATION:   followup of multiple myeloma.  HISTORY OF PRESENTING ILLNESS:   Michael [name] is a wonderful 66 y.o. 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 Patient is currently on a study protocol and is being monitored for myeloma recurrence.  Based on available records being put together  -Patient was diagnosed with IgG kappa ISS stage I multiple myeloma with hyperdiploid complex cytogenetics in 2012. Bone marrow biopsy apparently showed 50% kappa restricted plasma cells with an initial M spike of 3.4 g/dL with evidence of lytic lesions on his PET/CT scan including right rib fracture and lesions of the lumbar and cervical spine. -Patient was treated under protocol 11-C-0221 with from 02/21/2013 with  Carfilzomib/Revlimid/Dexamethasone for a total of 8 cycles. He reports that his stem cells were collected and stored after 4 cycles -Posttreatment bone marrow biopsy showed less than 5% plasma cells with a negative flow cytometry and improvement in his previously PET avid lesions. Some concern for new focus of activity at C2 lytic lesion at L4 and several small lesions throughout the vertebrae. -Patient was on maintenance lenalidomide 10 mg by mouth daily for 2 years or more until end of 2014. 08/19/2013 - bone marrow biopsy showed normocellular marrow with less than 5% plasma cells. PET CT scan showed  decreased focal metabolic ligament prior rib lesions and no new lesions. Flow cytometry was negative for residual disease. Serum and urine electrophoresis and IFE showed no evidence of monoclonal protein. -Patient notes that he was off protocol for a few years due to lack of clinical trial research funding. -Patient notes that he is back on a follow-up protocol and continues to follow and receive his primary treatment at [facility] at this time. -Patient reports he had his last PET CT scan blood and urine tests and bone marrow examination in February 2018. The results of which are not available to [place] currently.  He notes that there was concern for a very slowly growing sternal FDG avid lesion that has been present for years. He has a follow-up at [facility] next month to determine the management of this lesion. He reports that was some consideration of considering radiation. he notes that this lesion has not been biopsied. No other focal bone pains at this time.  We discussed in detail about what our role will be and noted that we shall be available for any acute issues that need to be taken care of locally but that at this time the primary treatment and evaluation will be driven by his team at [facility] as per their research protocols and input. The patient and his wife are clearly aware of this and are in agreement with this plan.  INTERVAL HISTORY  Patient comes here for follow-up with a plan to continue his treatment here in coordination with [facility].  He has been tolerating the maintenance Revlimid as well as the Zometa without any overt reportable toxicities. We discussed that his myeloma labs done recently shows some evidence of serologic  progression with an M spike of 0.3. He has no new bone pains. Notes no sternal pains. We discussed that he will need a complete reevaluation to determine if he needs to undergo induction treatment and again to achieve CR status. He has been scheduled for a PET scan in  August with repeat myeloma labs. We discussed that he would also need a repeat bone marrow examination which he reports has been scheduled at [facility]. He notes that his co-pay appears to be affordable in the 20-30$ range.   MEDICAL HISTORY:  Past Medical History:  Diagnosis Date  . Angioedema   . Gastroesophageal reflux disease without esophagitis   . Hyperlipidemia   . Multiple myeloma ([place]) 2012   Treated at [facility]  . Non morbid obesity due to excess calories   . Prediabetes   . Prediabetes   . Unspecified glaucoma(365.9)   . Urticaria, unspecified   Diabetes mellitus Glaucoma Left toes neuropathy GERD  SURGICAL HISTORY: Past Surgical History:  Procedure Laterality Date  . PORTACATH PLACEMENT      SOCIAL HISTORY: Social History   Social History  . Marital status: Married    Spouse name: N/A  . Number of children: 2  . Years of education: N/A   Occupational History  . Retired Chief Strategy Officer for [employer]  . Smoking status: Never Smoker  . Smokeless tobacco: Never Used  . Alcohol use 0.0 oz/week     Comment: Occasional beer  . Drug use: No  . Sexual activity: Not on file   Other Topics Concern  . Not on file   Social History Narrative  . No narrative on file  Never smoker Married Retired from the [employer] in June 2017 and moved to [facility].  FAMILY HISTORY: Family History  Problem Relation Age of Onset  . Cancer Father     ALLERGIES:  has No Known Allergies.  MEDICATIONS:  Current Outpatient Prescriptions  Medication Sig Dispense Refill  . aspirin 81 MG tablet Take 81 mg by mouth daily.    [name] atorvastatin (LIPITOR) 20 MG tablet Take 20 mg by mouth daily.    . Calcium Carbonate-Vitamin D (CALCIUM-D) 600-400 MG-UNIT TABS Take by mouth 2 (two) times daily.    [name] lenalidomide (REVLIMID) 10 MG capsule Take 1 capsule (10 mg total) by mouth daily. Auth #[id number] 28 capsule 0  . naproxen (NAPROSYN) 500 MG tablet  Take 500 mg by mouth 2 (two) times daily with a meal.    . pantoprazole (PROTONIX) 40 MG tablet Take 40 mg by mouth daily.     No current facility-administered medications for this visit.     REVIEW OF SYSTEMS:    10 Point review of Systems was done is negative except as noted above.  PHYSICAL EXAMINATION: ECOG PERFORMANCE STATUS: 1 - Symptomatic but completely ambulatory Vital signs reviewed in EPIC GENERAL:alert, in no acute distress and comfortable SKIN: no acute rashes, no significant lesions EYES: conjunctiva are pink and non-injected, sclera anicteric OROPHARYNX: MMM, no exudates, no oropharyngeal erythema or ulceration NECK: supple, no JVD LYMPH:  no palpable lymphadenopathy in the cervical, axillary or inguinal regions LUNGS: clear to auscultation b/l with normal respiratory effort HEART: regular rate & rhythm ABDOMEN:  Obese, normoactive bowel sounds , non tender, not distended. No palpable hepatosplenomegaly Extremity: no pedal edema PSYCH: alert & oriented x 3 with fluent speech NEURO: no focal motor/sensory deficits  LABORATORY DATA:  I have reviewed the data as  listed  . CBC Latest Ref Rng & Units 05/05/2017 03/20/2017  WBC 4.0 - 10.3 10e3/uL 4.3 3.9(L)  Hemoglobin 13.0 - 17.1 g/dL 13.8 13.6  Hematocrit 38.4 - 49.9 % 40.7 40.2  Platelets 140 - 400 10e3/uL 164 161    . CMP Latest Ref Rng & Units 05/05/2017 03/20/2017 03/20/2017  Glucose 70 - 140 mg/dl 121 117 -  BUN 7.0 - 26.0 mg/dL 11.1 14.3 -  Creatinine 0.7 - 1.3 mg/dL 0.8 0.8 -  Sodium 136 - 145 mEq/L 141 141 -  Potassium 3.5 - 5.1 mEq/L 3.8 4.1 -  CO2 22 - 29 mEq/L 26 27 -  Calcium 8.4 - 10.4 mg/dL 8.8 9.3 -  Total Protein 6.4 - 8.3 g/dL 6.7 6.6 6.3  Total Bilirubin 0.20 - 1.20 mg/dL 1.30(H) 1.00 -  Alkaline Phos 40 - 150 U/L 75 79 -  AST 5 - 34 U/L 13 17 -  ALT 0 - 55 U/L 23 24 -        Component     Latest Ref Rng & Units 03/20/2017  IgG (Immunoglobin G), Serum     700 - 1,600 mg/dL 815    IgA/Immunoglobulin A, Serum     61 - 437 mg/dL 112  IgM, Qn, Serum     20 - 172 mg/dL 36  Total Protein     6.0 - 8.5 g/dL 6.3  Albumin SerPl Elph-Mcnc     2.9 - 4.4 g/dL 3.7  Alpha 1     0.0 - 0.4 g/dL 0.2  Alpha2 Glob SerPl Elph-Mcnc     0.4 - 1.0 g/dL 0.6  B-Globulin SerPl Elph-Mcnc     0.7 - 1.3 g/dL 1.0  Gamma Glob SerPl Elph-Mcnc     0.4 - 1.8 g/dL 0.8  M Protein SerPl Elph-Mcnc     Not Observed g/dL 0.3 (H)  Globulin, Total     2.2 - 3.9 g/dL 2.6  Albumin/Glob SerPl     0.7 - 1.7 1.5  IFE 1      Comment  Please Note (HCV):      Comment  Ig Kappa Free Light Chain     3.3 - 19.4 mg/L 13.2  Ig Lambda Free Light Chain     5.7 - 26.3 mg/L 7.0  Kappa/Lambda FluidC Ratio     0.26 - 1.65 1.89 (H)  LDH     125 - 245 U/L 170  Beta 2     0.6 - 2.4 mg/L 1.4    RADIOGRAPHIC STUDIES: I have personally reviewed the radiological images as listed and agreed with the findings in the report. No results found.  CT Chest Final Exam: CT Chest Exam Date: 03/02/2017 Accession #: [id number] Ordered By: [name] CLINICAL INDICATION (entered by referring clinician): follow up TECHNIQUE: Multidetector helical (2 mm, dual [id number]) images following vascular contrast administration (181 cm, 120 kg, 2 ml/s, 120 ml [id number]) obtained without apparent complication. 5 mm images also reconstructed from data. Additional history: Multiple myeloma. COMPARISON: Chest CT component of 21 November 2016 PET-CT; no prior comparison diagnostic CT available. FINDINGS: Chest: Scattered bone radiolucencies of various sizes affecting sternum, manubrium, and multiple vertebrae without definite change. Right hilum calcifications and one or few noncalcified lung small foci, such as (0.3 cm) (series 4, image 229) compatible with granulomata. Spine and vascular calcification. Dependent gallbladder calcifications compatible with calculi. Marked upper intra-abdominal fat. No evidence of  splenomegaly, pleural or pericardial effusion, bulky axilla, mediastinum, or left hilum adenopathy, or lung consolidation.  IMPRESSION: No definite change in bone lesions compatible with history of myeloma, though comparison limited. Cholelithiasis. Granulomata. 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 Bone marrow biopsy apparently showed 50% kappa restricted plasma cells with an initial M spike of 3.4 g/dL with evidence of lytic lesions on his PET/CT scan including right rib fracture and lesions of the lumbar and cervical spine. -Patient was treated under protocol 11-C-0221 with from 02/21/2013 with  Carfilzomib/Revlimid/Dexamethasone for a total of 8 cycles. He reports that his stem cells were collected and stored after 4 cycles -Posttreatment bone marrow biopsy showed less than 5% plasma cells with a negative flow cytometry and improvement in his previously PET avid lesions. Some concern for new focus of activity at C2 lytic lesion at L4 and several small lesions throughout the vertebrae. -Patient was on maintenance lenalidomide 10 mg by mouth daily for 2 years or more until end of 2014. 08/19/2013 - bone marrow biopsy showed normocellular marrow with less than 5% plasma cells. PET CT scan showed decreased focal metabolic ligament prior rib lesions and no new lesions. Flow cytometry was negative for residual disease. Serum and urine electrophoresis and IFE showed no evidence of monoclonal protein. -Patient notes that he was off protocol for a few years due to lack of clinical trial research funding. -Patient notes that he is back on a follow-up protocol and continues to follow and receive his primary treatment at [facility] at this time.  -Patient's recent labs  show minimal IgG kappa protein and IFE and a CT chest in May 2018 that did not show any overt signs of myeloma progression in the bones.  Rpt Myeloma labs 03/2017 - M spike of 0.3g/dl Plan -no prohibitive toxicities from Zometa or Revlimid maintenance at this time. -Thus far his overall disease course has been relatively indolent.  -We discussed that if he has serologic or osseous progression he might need to be undergo induction treatment again . -Continue on Revlimid 10 mg by mouth daily for myeloma maintenance. 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 Every monthly until repeat PET scan in 05/2017 with repeat labs. If stable will switch to q3 months. -[facility] as per their recommendations in 08/2017 with labs, BM and PET/CT  -continue Zometa q4weeks -f/u for PET/CT as scheduled on 05/23/2017 -Labs on 05/23/2017 -RTC in 1 week after labs and PET/CT for followup   All of the patients questions were answered with apparent satisfaction. The patient knows to call the clinic with any problems, questions or concerns.  I spent 20 minutes counseling the patient face to face. The total time spent in the appointment was 25 minutes and more than 50% was on counseling and direct patient cares.    Sullivan Lone MD [place] AAHIVMS [facility] Hematology/Oncology Physician [facility]  (Office):       [phone] (Work cell):  [phone] (Fax):           [phone]  .05/05/2017

## 2017-05-07 NOTE — Progress Notes (Signed)
[name]    HEMATOLOGY/ONCOLOGY CLINIC NOTE  Date of Service: .03/28/2017  Patient Care Team: [name], MD as PCP - General (Family Medicine) [name] MD ([facility] _ primary oncology) phone number [phone] Email- [email or web address]  CHIEF COMPLAINTS/PURPOSE OF CONSULTATION:  Establish local oncology care for followup of multiple myeloma.   HISTORY OF PRESENTING ILLNESS:   Michael [name] is a wonderful 66 y.o. 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 Patient is currently on a study protocol and is being monitored for myeloma recurrence.  Based on available records being put together  -Patient was diagnosed with IgG kappa ISS stage I multiple myeloma with hyperdiploid complex cytogenetics in 2012. Bone marrow biopsy apparently showed 50% kappa restricted plasma cells with an initial M spike of 3.4 g/dL with evidence of lytic lesions on his PET/CT scan including right rib fracture and lesions of the lumbar and cervical spine. -Patient was treated under protocol 11-C-0221 with from 02/21/2013 with  Carfilzomib/Revlimid/Dexamethasone for a total of 8 cycles. He reports that his stem cells were collected and stored after 4 cycles -Posttreatment bone marrow biopsy showed less than 5% plasma cells with a negative flow cytometry and improvement in his previously PET avid lesions. Some concern for new focus of activity at C2 lytic lesion at L4 and several small lesions throughout the vertebrae. -Patient was on maintenance lenalidomide 10 mg by mouth daily for 2 years or more until end of 2014. 08/19/2013 - bone marrow biopsy showed normocellular marrow with less than 5%  plasma cells. PET CT scan showed decreased focal metabolic ligament prior rib lesions and no new lesions. Flow cytometry was negative for residual disease. Serum and urine electrophoresis and IFE showed no evidence of monoclonal protein. -Patient notes that he was off protocol for a few years due to lack of clinical trial research funding. -Patient notes that he is back on a follow-up protocol and continues to follow and [facility] primary treatment [facility] at this time. -Patient reports he had his last PET CT scan blood and urine tests and bone marrow examination in February 2018. The results of which are not available to [place] currently.  He notes that there was concern for a very slowly growing sternal FDG avid lesion that has been present for years. He has a follow-up at [facility] next month to determine the management of this lesion. He reports that was some consideration of considering radiation. he notes that this lesion has not been biopsied. No other focal bone pains at this time.  We discussed in detail about what our role will be and noted that we shall be available for any acute issues that need to be taken care of locally but that at this time the primary treatment and evaluation will be driven by his team at [facility] as per their research protocols and input. The patient and his wife are clearly aware of this and are in agreement with this plan.  INTERVAL HISTORY  Patient comes here for follow-up with a plan to continue his treatment here in coordination with [facility]. He has a questionable sternal lesion that is unchanged on his CT chest 02/2017 and a weak IgG Kappa band on SPEP/IFE. He notes he is  feeling well and was recommended to go back on maintenance Revlimid and Zometa. We discussed this option and he is agreeable with this . He has tolerated Revlimid treatment and maintenance in the past. No other acute new concerns. He wants to make sure that the Revlimid will be affordable  otherwise he will need to get the medication from a regional [place] facility.  MEDICAL HISTORY:  Past Medical History:  Diagnosis Date  . Angioedema   . Gastroesophageal reflux disease without esophagitis   . Hyperlipidemia   . Multiple myeloma ([place]) 2012   Treated at [facility]  . Non morbid obesity due to excess calories   . Prediabetes   . Prediabetes   . Unspecified glaucoma(365.9)   . Urticaria, unspecified   Diabetes mellitus Glaucoma Left toes neuropathy GERD  SURGICAL HISTORY: Past Surgical History:  Procedure Laterality Date  . PORTACATH PLACEMENT      SOCIAL HISTORY: Social History   Social History  . Marital status: Married    Spouse name: N/A  . Number of children: 2  . Years of education: N/A   Occupational History  . 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 Smoking status: Never Smoker  . Smokeless tobacco: Never Used  . Alcohol use 0.0 oz/week     Comment: Occasional beer  . Drug use: No  . Sexual activity: Not on file   Other Topics Concern  . Not on file   Social History Narrative  . No narrative on file  Never smoker Married Retired from the [employer] in June 2017 and moved to [facility].  FAMILY HISTORY: Family History  Problem Relation Age of Onset  . Cancer Father     ALLERGIES:  has No Known Allergies.  MEDICATIONS:  Current Outpatient Prescriptions  Medication Sig Dispense Refill  . naproxen (NAPROSYN) 500 MG tablet Take 500 mg by mouth 2 (two) times daily with a meal.    . aspirin 81 MG tablet Take 81 mg by mouth daily.    [name] atorvastatin (LIPITOR) 20 MG tablet Take 20 mg by mouth daily.    . Calcium Carbonate-Vitamin D (CALCIUM-D) 600-400 MG-UNIT TABS Take by mouth 2 (two) times daily.    [name] lenalidomide (REVLIMID) 10 MG capsule Take 1 capsule (10 mg total) by mouth daily. Auth #[id number] 28 capsule 0  . pantoprazole (PROTONIX) 40 MG tablet Take 40 mg by mouth daily.     No current  facility-administered medications for this visit.     REVIEW OF SYSTEMS:    10 Point review of Systems was done is negative except as noted above.  PHYSICAL EXAMINATION: ECOG PERFORMANCE STATUS: 1 - Symptomatic but completely ambulatory Vital signs reviewed in EPIC GENERAL:alert, in no acute distress and comfortable SKIN: no acute rashes, no significant lesions EYES: conjunctiva are pink and non-injected, sclera anicteric OROPHARYNX: MMM, no exudates, no oropharyngeal erythema or ulceration NECK: supple, no JVD LYMPH:  no palpable lymphadenopathy in the cervical, axillary or inguinal regions LUNGS: clear to auscultation b/l with normal respiratory effort HEART: regular rate & rhythm ABDOMEN:  Obese, normoactive bowel sounds , non tender, not distended. No palpable hepatosplenomegaly Extremity: no pedal edema PSYCH: alert & oriented x 3 with fluent speech NEURO: no focal motor/sensory deficits  LABORATORY DATA:  I have reviewed the data as listed  . CBC Latest Ref Rng & Units 05/05/2017 03/20/2017  WBC 4.0 - 10.3 10e3/uL 4.3 3.9(L)  Hemoglobin 13.0 - 17.1 g/dL 13.8  13.6  Hematocrit 38.4 - 49.9 % 40.7 40.2  Platelets 140 - 400 10e3/uL 164 161    . CMP Latest Ref Rng & Units 05/05/2017 03/20/2017 03/20/2017  Glucose 70 - 140 mg/dl 121 117 -  BUN 7.0 - 26.0 mg/dL 11.1 14.3 -  Creatinine 0.7 - 1.3 mg/dL 0.8 0.8 -  Sodium 136 - 145 mEq/L 141 141 -  Potassium 3.5 - 5.1 mEq/L 3.8 4.1 -  CO2 22 - 29 mEq/L 26 27 -  Calcium 8.4 - 10.4 mg/dL 8.8 9.3 -  Total Protein 6.4 - 8.3 g/dL 6.7 6.6 6.3  Total Bilirubin 0.20 - 1.20 mg/dL 1.30(H) 1.00 -  Alkaline Phos 40 - 150 U/L 75 79 -  AST 5 - 34 U/L 13 17 -  ALT 0 - 55 U/L 23 24 -       RADIOGRAPHIC STUDIES: I have personally reviewed the radiological images as listed and agreed with the findings in the report. No results found.  CT Chest Final Exam: CT Chest Exam Date: 03/02/2017 Accession #: [id number] Ordered By: [name] CLINICAL INDICATION (entered by referring clinician): follow up TECHNIQUE: Multidetector helical (2 mm, dual [id number]) images following vascular contrast administration (181 cm, 120 kg, 2 ml/s, 120 ml Isovue-300) obtained without apparent complication. 5 mm images also reconstructed from data. Additional history: Multiple myeloma. COMPARISON: Chest CT component of 21 November 2016 PET-CT; no prior comparison diagnostic CT available. FINDINGS: Chest: Scattered bone radiolucencies of various sizes affecting sternum, manubrium, and multiple vertebrae without definite change. Right hilum calcifications and one or few noncalcified lung small foci, such as (0.3 cm) (series 4, image 229) compatible with granulomata. Spine and vascular calcification. Dependent gallbladder calcifications compatible with calculi. Marked upper intra-abdominal fat. No evidence of splenomegaly, pleural or pericardial effusion, bulky axilla, mediastinum, or left hilum adenopathy, or lung consolidation. IMPRESSION: No definite change in bone lesions compatible with history of myeloma, though comparison limited. Cholelithiasis. Granulomata. 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 Bone marrow biopsy apparently showed 50% kappa restricted plasma cells with an initial M spike of 3.4 g/dL with evidence of lytic lesions on his PET/CT scan including right rib fracture and lesions of the lumbar and cervical spine. -Patient was treated under protocol 11-C-0221 with from 02/21/2013 with  Carfilzomib/Revlimid/Dexamethasone for a total of 8 cycles. He reports that his stem cells were collected and stored after 4 cycles -Posttreatment bone marrow biopsy showed less than 5% plasma cells  with a negative flow cytometry and improvement in his previously PET avid lesions. Some concern for new focus of activity at C2 lytic lesion at L4 and several small lesions throughout the vertebrae. -Patient was on maintenance lenalidomide 10 mg by mouth daily for 2 years or more until end of 2014. 08/19/2013 - bone marrow biopsy showed normocellular marrow with less than 5% plasma cells. PET CT scan showed decreased focal metabolic ligament prior rib lesions and no new lesions. Flow cytometry was negative for residual disease. Serum and urine electrophoresis and IFE showed no evidence of monoclonal protein. -Patient notes that he was off protocol for a few years due to lack of clinical trial research funding. -Patient notes that he is back on a follow-up protocol and continues to follow and receive his primary treatment at [facility]  at this time.  Plan  Patient's recent labs show minimal IgG kappa protein and IFE and a CT chest in May 2018 that did not show any overt signs of myeloma progression in the bones. -We will start him on Revlimid 10 mg by mouth daily for myeloma maintenance. 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 Every monthly until repeat PET scan in couple of months with repeat labs. If stable will switch to q3 months -baseline rpt myeloma today today -f/u in 3-4 weeks for toxicity check with rpt cbc, cmp.  Orders Placed This Encounter  Procedures  . NM PET Image Restage (PS) Whole Body    Standing Status:   Future    Standing Expiration Date:   03/28/2018    Order Specific Question:   Reason for Exam (SYMPTOM  OR DIAGNOSIS REQUIRED)    Answer:   re-evaluation for extramedullary myeloma recurrence to determine further treatment strategy    Order Specific Question:   If indicated for the ordered procedure, I authorize the administration of a radiopharmaceutical per Radiology protocol    Answer:   Yes    Order Specific Question:   Preferred imaging  location?    Answer:   [facility]    Order Specific Question:   Radiology Contrast Protocol - do NOT remove file path    Answer:   \\[email or web address]  . CBC & Diff and Retic    Standing Status:   Future    Standing Expiration Date:   03/28/2018  . Comprehensive metabolic panel    Standing Status:   Future    Standing Expiration Date:   03/28/2018  . Multiple Myeloma Panel (SPEP&IFE w/QIG)    Standing Status:   Future    Standing Expiration Date:   03/28/2018  . Kappa/lambda light chains    Standing Status:   Future    Standing Expiration Date:   05/02/2018   -[facility] f[facility] at [facility] as per their recommendations.  Please schedule for Q4 weekly Zometa Revlimid prescription sent PET/CT in 6-8 weeks RTC with Dr Irene Limbo in 2 months with labs  All of the patients questions were answered with apparent satisfaction. The patient knows to call the clinic with any problems, questions or concerns.  I spent 30 minutes counseling the patient face to face. The total time spent in the appointment was 40 minutes and more than 50% was on counseling and direct patient cares.    Sullivan Lone MD [place] AAHIVMS [facility] Hematology/Oncology Physician [facility]  (Office):       [phone] (Work cell):  [phone] (Fax):           [phone]  .03/28/2017

## 2017-05-07 NOTE — Progress Notes (Deleted)
[name]    HEMATOLOGY/ONCOLOGY CONSULTATION NOTE  Date of Service: 05/07/2017  Patient Care Team: [name], MD as PCP - General (Family Medicine) [name] MD ([facility] _ primary oncology) phone number [phone] Email- [email or web address]  CHIEF COMPLAINTS/PURPOSE OF CONSULTATION:  Establish local oncology care for followup of multiple myeloma.   HISTORY OF PRESENTING ILLNESS:   Michael [name] is a wonderful 67 y.o. 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 Patient is currently on a study protocol and is being monitored for myeloma recurrence.  Based on available records being put together  -Patient was diagnosed with IgG kappa ISS stage I multiple myeloma with hyperdiploid complex cytogenetics in 2012. Bone marrow biopsy apparently showed 50% kappa restricted plasma cells with an initial M spike of 3.4 g/dL with evidence of lytic lesions on his PET/CT scan including right rib fracture and lesions of the lumbar and cervical spine. -Patient was treated under protocol 11-C-0221 with from 02/21/2013 with  Carfilzomib/Revlimid/Dexamethasone for a total of 8 cycles. He reports that his stem cells were collected and stored after 4 cycles -Posttreatment bone marrow biopsy showed less than 5% plasma cells with a negative flow cytometry and improvement in his previously PET avid lesions. Some concern for new focus of activity at C2 lytic lesion at L4 and several small lesions throughout the vertebrae. -Patient was on maintenance lenalidomide 10 mg by mouth daily for 2 years or more until end of 2014. 08/19/2013 - bone marrow biopsy showed normocellular marrow with less than  5% plasma cells. PET CT scan showed decreased focal metabolic ligament prior rib lesions and no new lesions. Flow cytometry was negative for residual disease. Serum and urine electrophoresis and IFE showed no evidence of monoclonal protein. -Patient notes that he was off protocol for a few years due to lack of clinical trial research funding. -Patient notes that he is back on a follow-up protocol and continues to follow and receive his primary treatment at [facility] at this time. -Patient reports he had his last PET CT scan blood and urine tests and bone marrow examination in February 2018. The results of which are not available to [place] currently.  He notes that there was concern for a very slowly growing sternal FDG avid lesion that has been present for years. He has a follow-up at [facility] next month to determine the management of this lesion. He reports that was some consideration of considering radiation. he notes that this lesion has not been biopsied. No other focal bone pains at this time.  We discussed in detail about what our role will be and noted that we shall be available for any acute issues that need to be taken care of locally but that at this time the primary treatment and evaluation will be driven by his team at [facility] as per their research protocols and input. The patient and his wife are clearly aware of this and are in agreement with this plan.    MEDICAL HISTORY:  Past Medical History:  Diagnosis Date  . Angioedema   . Gastroesophageal reflux disease without esophagitis   . Hyperlipidemia   . Multiple myeloma ([place]) 2012   Treated at [facility]  . Non morbid obesity due to excess  calories   . Prediabetes   . Prediabetes   . Unspecified glaucoma(365.9)   . Urticaria, unspecified   Diabetes mellitus Glaucoma Left toes neuropathy GERD  SURGICAL HISTORY: Past Surgical History:  Procedure Laterality Date  . PORTACATH PLACEMENT      SOCIAL HISTORY: Social History   Social  History  . Marital status: Married    Spouse name: N/A  . Number of children: 2  . Years of education: N/A   Occupational History  . 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 Smoking status: Never Smoker  . Smokeless tobacco: Never Used  . Alcohol use 0.0 oz/week     Comment: Occasional beer  . Drug use: No  . Sexual activity: Not on file   Other Topics Concern  . Not on file   Social History Narrative  . No narrative on file  Never smoker Married Retired from the [employer] in June 2017 and moved to [facility].  FAMILY HISTORY: Family History  Problem Relation Age of Onset  . Cancer Father     ALLERGIES:  has No Known Allergies.  MEDICATIONS:  Current Outpatient Prescriptions  Medication Sig Dispense Refill  . aspirin 81 MG tablet Take 81 mg by mouth daily.    [name] atorvastatin (LIPITOR) 20 MG tablet Take 20 mg by mouth daily.    . Calcium Carbonate-Vitamin D (CALCIUM-D) 600-400 MG-UNIT TABS Take by mouth 2 (two) times daily.    [name] lenalidomide (REVLIMID) 10 MG capsule Take 1 capsule (10 mg total) by mouth daily. Auth #[id number] 28 capsule 0  . naproxen (NAPROSYN) 500 MG tablet Take 500 mg by mouth 2 (two) times daily with a meal.    . pantoprazole (PROTONIX) 40 MG tablet Take 40 mg by mouth daily.     No current facility-administered medications for this visit.     REVIEW OF SYSTEMS:    10 Point review of Systems was done is negative except as noted above.  PHYSICAL EXAMINATION: ECOG PERFORMANCE STATUS: 1 - Symptomatic but completely ambulatory Vital signs reviewed in EPIC GENERAL:alert, in no acute distress and comfortable SKIN: no acute rashes, no significant lesions EYES: conjunctiva are pink and non-injected, sclera anicteric OROPHARYNX: MMM, no exudates, no oropharyngeal erythema or ulceration NECK: supple, no JVD LYMPH:  no palpable lymphadenopathy in the cervical, axillary or inguinal regions LUNGS: clear to  auscultation b/l with normal respiratory effort HEART: regular rate & rhythm ABDOMEN:  Obese, normoactive bowel sounds , non tender, not distended. No palpable hepatosplenomegaly Extremity: no pedal edema PSYCH: alert & oriented x 3 with fluent speech NEURO: no focal motor/sensory deficits  LABORATORY DATA:  I have reviewed the data as listed  . CBC Latest Ref Rng & Units 05/05/2017 03/20/2017  WBC 4.0 - 10.3 10e3/uL 4.3 3.9(L)  Hemoglobin 13.0 - 17.1 g/dL 13.8 13.6  Hematocrit 38.4 - 49.9 % 40.7 40.2  Platelets 140 - 400 10e3/uL 164 161    . CMP Latest Ref Rng & Units 05/05/2017 03/20/2017 03/20/2017  Glucose 70 - 140 mg/dl 121 117 -  BUN 7.0 - 26.0 mg/dL 11.1 14.3 -  Creatinine 0.7 - 1.3 mg/dL 0.8 0.8 -  Sodium 136 - 145 mEq/L 141 141 -  Potassium 3.5 - 5.1 mEq/L 3.8 4.1 -  CO2 22 - 29 mEq/L 26 27 -  Calcium 8.4 - 10.4 mg/dL 8.8 9.3 -  Total Protein 6.4 - 8.3 g/dL 6.7 6.6 6.3  Total Bilirubin 0.20 -  1.20 mg/dL 1.30(H) 1.00 -  Alkaline Phos 40 - 150 U/L 75 79 -  AST 5 - 34 U/L 13 17 -  ALT 0 - 55 U/L 23 24 -     RADIOGRAPHIC STUDIES: I have personally reviewed the radiological images as listed and agreed with the findings in the report. No results found.  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 Bone marrow biopsy apparently showed 50% kappa restricted plasma cells with an initial M spike of 3.4 g/dL with evidence of lytic lesions on his PET/CT scan including right rib fracture and lesions of the lumbar and cervical spine. -Patient was treated under protocol 11-C-0221 with from 02/21/2013 with  Carfilzomib/Revlimid/Dexamethasone for a total of 8 cycles. He reports that his stem cells were collected and stored after 4 cycles -Posttreatment bone marrow biopsy showed less than 5% plasma cells with a negative flow  cytometry and improvement in his previously PET avid lesions. Some concern for new focus of activity at C2 lytic lesion at L4 and several small lesions throughout the vertebrae. -Patient was on maintenance lenalidomide 10 mg by mouth daily for 2 years or more until end of 2014. 08/19/2013 - bone marrow biopsy showed normocellular marrow with less than 5% plasma cells. PET CT scan showed decreased focal metabolic ligament prior rib lesions and no new lesions. Flow cytometry was negative for residual disease. Serum and urine electrophoresis and IFE showed no evidence of monoclonal protein. -Patient notes that he was off protocol for a few years due to lack of clinical trial research funding. -Patient notes that he is back on a follow-up protocol and continues to follow and receive his primary treatment at [facility] at this time.  Plan -Patient reports he had his last PET CT scan blood and urine tests and bone marrow examination in February 2018. The results of which are not available to [place] currently. I have asked by RN to try to obtained these results.  -He notes that there was concern for a very slowly growing sternal FDG avid lesion that has been present for years. He has a follow-up at [facility] next month to determine the management of this lesion. He reports that was some consideration of considering radiation. he notes that this lesion has not been biopsied. No other focal bone pains at this time.  -We discussed in detail about what our role will be and noted that we shall be available for any acute issues that need to be taken care of locally but that at this time the primary treatment and evaluation will be driven by his team at [facility] as per their research protocols and input. The patient and his wife are clearly aware of this and are in agreement with this plan. They are aware that they would need to inform me/our clinic if they decide to pursue active treatment locally off proctocol.  -we will  try to get in touch with his primary oncologist at [place] [facility]  RTC with Dr Irene Limbo on an as needed basis Primary oncologic cares to continue at [facility] with Dr Jaclyn Prime MD  All of the patients questions were answered with apparent satisfaction. The patient knows to call the clinic with any problems, questions or concerns.  I spent 55 minutes counseling the patient face to face. The total time spent in the appointment was 65 minutes  and more than 50% was on counseling and direct patient cares.    Sullivan Lone MD [place] AAHIVMS [facility] Hematology/Oncology Physician [facility]  (Office):       [phone] (Work cell):  [phone] (Fax):           [phone]  .05/05/2017

## 2017-05-08 ENCOUNTER — Telehealth: Payer: Self-pay

## 2017-05-09 ENCOUNTER — Telehealth: Payer: Self-pay

## 2017-05-09 NOTE — Telephone Encounter (Signed)
Patient information sent to MD for clinical trial. Confirmed fax receipt.

## 2017-05-12 ENCOUNTER — Telehealth: Payer: Self-pay

## 2017-05-12 NOTE — Telephone Encounter (Signed)
Told pt we still have discs for him to p/u. He said he will get them at next appt. Infusion 8/7 and doctor 8/14.

## 2017-05-12 NOTE — Telephone Encounter (Signed)
Sent 05/05/17 office note to physician in patient care team. Confirmed fax receipt.

## 2017-05-23 ENCOUNTER — Ambulatory Visit: Payer: Medicare Other | Admitting: Hematology

## 2017-05-23 ENCOUNTER — Encounter (HOSPITAL_COMMUNITY)
Admission: RE | Admit: 2017-05-23 | Discharge: 2017-05-23 | Disposition: A | Discharge status: Home / Self Care | Payer: Medicare Other | Source: Ambulatory Visit | Attending: Hematology | Admitting: Hematology

## 2017-05-23 ENCOUNTER — Other Ambulatory Visit (HOSPITAL_BASED_OUTPATIENT_CLINIC_OR_DEPARTMENT_OTHER): Payer: Medicare Other

## 2017-05-23 ENCOUNTER — Ambulatory Visit (HOSPITAL_BASED_OUTPATIENT_CLINIC_OR_DEPARTMENT_OTHER): Payer: Medicare Other

## 2017-05-23 ENCOUNTER — Other Ambulatory Visit: Payer: Medicare Other

## 2017-05-23 VITALS — BP 132/74 | HR 63 | Temp 98.7°F | Resp 16

## 2017-05-23 DIAGNOSIS — C9002 Multiple myeloma in relapse: Secondary | ICD-10-CM | POA: Insufficient documentation

## 2017-05-23 DIAGNOSIS — C9000 Multiple myeloma not having achieved remission: Secondary | ICD-10-CM

## 2017-05-23 LAB — CBC & DIFF AND RETIC
BASO%: 2.7 % — ABNORMAL HIGH (ref 0.0–2.0)
Basophils Absolute: 0.1 10*3/uL (ref 0.0–0.1)
EOS%: 4.7 % (ref 0.0–7.0)
Eosinophils Absolute: 0.2 10*3/uL (ref 0.0–0.5)
HEMATOCRIT: 44.5 % (ref 38.4–49.9)
HGB: 15.4 g/dL (ref 13.0–17.1)
IMMATURE RETIC FRACT: 3.7 % (ref 3.00–10.60)
LYMPH#: 1.4 10*3/uL (ref 0.9–3.3)
LYMPH%: 29 % (ref 14.0–49.0)
MCH: 31.1 pg (ref 27.2–33.4)
MCHC: 34.6 g/dL (ref 32.0–36.0)
MCV: 89.9 fL (ref 79.3–98.0)
MONO#: 0.7 10*3/uL (ref 0.1–0.9)
MONO%: 15.2 % — AB (ref 0.0–14.0)
NEUT#: 2.3 10*3/uL (ref 1.5–6.5)
NEUT%: 48.4 % (ref 39.0–75.0)
Platelets: 169 10*3/uL (ref 140–400)
RBC: 4.95 10*6/uL (ref 4.20–5.82)
RDW: 13.8 % (ref 11.0–14.6)
RETIC %: 1.76 % (ref 0.80–1.80)
RETIC CT ABS: 87.12 10*3/uL (ref 34.80–93.90)
WBC: 4.7 10*3/uL (ref 4.0–10.3)

## 2017-05-23 LAB — COMPREHENSIVE METABOLIC PANEL
ALT: 33 U/L (ref 0–55)
ANION GAP: 10 meq/L (ref 3–11)
AST: 22 U/L (ref 5–34)
Albumin: 4.4 g/dL (ref 3.5–5.0)
Alkaline Phosphatase: 84 U/L (ref 40–150)
BUN: 16.9 mg/dL (ref 7.0–26.0)
CHLORIDE: 104 meq/L (ref 98–109)
CO2: 25 meq/L (ref 22–29)
Calcium: 9.7 mg/dL (ref 8.4–10.4)
Creatinine: 0.8 mg/dL (ref 0.7–1.3)
GLUCOSE: 113 mg/dL (ref 70–140)
POTASSIUM: 3.7 meq/L (ref 3.5–5.1)
Sodium: 139 mEq/L (ref 136–145)
Total Bilirubin: 2.72 mg/dL — ABNORMAL HIGH (ref 0.20–1.20)
Total Protein: 7.5 g/dL (ref 6.4–8.3)

## 2017-05-23 LAB — GLUCOSE, CAPILLARY: GLUCOSE-CAPILLARY: 117 mg/dL — AB (ref 65–99)

## 2017-05-23 MED ORDER — ZOLEDRONIC ACID 4 MG/100ML IV SOLN
4.0000 mg | Freq: Once | INTRAVENOUS | Status: AC
  Administered 2017-05-23: 4 mg via INTRAVENOUS
  Filled 2017-05-23: qty 100

## 2017-05-23 MED ORDER — SODIUM CHLORIDE 0.9 % IV SOLN
Freq: Once | INTRAVENOUS | Status: AC
  Administered 2017-05-23: 14:00:00 via INTRAVENOUS

## 2017-05-23 MED ORDER — FLUDEOXYGLUCOSE F - 18 (FDG) INJECTION
12.9700 | Freq: Once | INTRAVENOUS | Status: AC | PRN
  Administered 2017-05-23: 12.97 via INTRAVENOUS

## 2017-05-23 NOTE — Progress Notes (Signed)
Notified Dr. Irene Limbo of elevated bilirubin. No new orders.

## 2017-05-23 NOTE — Patient Instructions (Signed)

## 2017-05-24 LAB — KAPPA/LAMBDA LIGHT CHAINS
Ig Kappa Free Light Chain: 19.2 mg/L (ref 3.3–19.4)
Ig Lambda Free Light Chain: 11.7 mg/L (ref 5.7–26.3)
Kappa/Lambda FluidC Ratio: 1.64 (ref 0.26–1.65)

## 2017-05-25 LAB — MULTIPLE MYELOMA PANEL, SERUM
ALBUMIN/GLOB SERPL: 1.4 (ref 0.7–1.7)
ALPHA 1: 0.2 g/dL (ref 0.0–0.4)
ALPHA2 GLOB SERPL ELPH-MCNC: 0.6 g/dL (ref 0.4–1.0)
Albumin SerPl Elph-Mcnc: 4 g/dL (ref 2.9–4.4)
B-GLOBULIN SERPL ELPH-MCNC: 1 g/dL (ref 0.7–1.3)
GAMMA GLOB SERPL ELPH-MCNC: 1 g/dL (ref 0.4–1.8)
GLOBULIN, TOTAL: 2.9 g/dL (ref 2.2–3.9)
IGG (IMMUNOGLOBIN G), SERUM: 942 mg/dL (ref 700–1600)
IGM (IMMUNOGLOBIN M), SRM: 38 mg/dL (ref 20–172)
IgA, Qn, Serum: 123 mg/dL (ref 61–437)
M PROTEIN SERPL ELPH-MCNC: 0.3 g/dL — AB
Total Protein: 6.9 g/dL (ref 6.0–8.5)

## 2017-05-30 ENCOUNTER — Encounter: Payer: Self-pay | Admitting: Hematology

## 2017-05-30 ENCOUNTER — Ambulatory Visit (HOSPITAL_BASED_OUTPATIENT_CLINIC_OR_DEPARTMENT_OTHER): Payer: Medicare Other | Admitting: Hematology

## 2017-05-30 ENCOUNTER — Telehealth: Payer: Self-pay | Admitting: Hematology

## 2017-05-30 ENCOUNTER — Other Ambulatory Visit: Payer: Self-pay | Admitting: *Deleted

## 2017-05-30 VITALS — BP 139/87 | HR 65 | Temp 97.7°F | Resp 18 | Ht 73.0 in | Wt 253.4 lb

## 2017-05-30 DIAGNOSIS — N429 Disorder of prostate, unspecified: Secondary | ICD-10-CM

## 2017-05-30 DIAGNOSIS — C9002 Multiple myeloma in relapse: Secondary | ICD-10-CM

## 2017-05-30 DIAGNOSIS — C9000 Multiple myeloma not having achieved remission: Secondary | ICD-10-CM | POA: Diagnosis not present

## 2017-05-30 MED ORDER — LENALIDOMIDE 10 MG PO CAPS: 10.0000 mg | ORAL_CAPSULE | Freq: Every day | ORAL | 0 refills | Status: DC

## 2017-05-30 NOTE — Telephone Encounter (Signed)
Gave patient avs and calendar for appts.  °

## 2017-05-30 NOTE — Patient Instructions (Signed)
Thank you for choosing [facility] to provide your oncology and hematology care.  To afford each patient quality time with our providers, please arrive 30 minutes before your scheduled appointment time.  If you arrive late for your appointment, you may be asked to reschedule.  We strive to give you quality time with our providers, and arriving late affects you and other patients whose appointments are after yours.   If you are a no show for multiple scheduled visits, you may be dismissed from the clinic at the providers discretion.    Again, thank you for choosing [facility], our hope is that these requests will decrease the amount of time that you wait before being seen by our physicians.  ______________________________________________________________________  Should you have questions after your visit to the [facility], please contact our office at [phone] between the hours of 8:30 and 4:30 p.m.    Voicemails left after 4:30p.m will not be returned until the following business day.    For prescription refill requests, please have your pharmacy contact us directly.  Please also try to allow 48 hours for prescription requests.    Please contact the scheduling department for questions regarding scheduling.  For scheduling of procedures such as PET scans, CT scans, MRI, Ultrasound, etc please contact central scheduling at [phone].    Resources For Cancer Patients and Caregivers:   Oncolink.org:  A wonderful resource for patients and healthcare providers for information regarding your disease, ways to tract your treatment, what to expect, etc.     American Cancer Society:  800-227-2345  Can help patients locate various types of support and financial assistance  Cancer Care: 1-800-813-HOPE (4673) Provides financial assistance, online support groups, medication/co-pay assistance.    [place] DSS:  336-641-3447 Where to apply for food  stamps, Medicaid, and utility assistance  Medicare Rights Center: 800-333-4114 Helps people with Medicare understand their rights and benefits, navigate the Medicare system, and secure the quality healthcare they deserve  SCAT: 336-333-6589 [employer] [employer]'s shared-ride transportation service for eligible riders who have a disability that prevents them from riding the fixed route bus.    For additional information on assistance programs please contact our social worker:   Grier Hock/Abigail Elmore:  [phone]            

## 2017-05-31 ENCOUNTER — Telehealth: Payer: Self-pay

## 2017-05-31 NOTE — Telephone Encounter (Signed)
Pt had called [facility] yesterday saying that Dr. Irene Limbo would like for him to discontinue his lipitor. No documentation received by Sharee Pimple, RN at [facility], so she called this morning to discuss. Pt's liver function tests elevated. Dr. Grier Mitts recommendation to decrease dosage of lipitor and naproxen because both can affect the liver. Naproxen to be taken 3 weeks on and 1 week off. Pt to discuss lipitor dosage per PCP discretion.

## 2017-06-05 ENCOUNTER — Other Ambulatory Visit: Payer: Self-pay | Admitting: Student

## 2017-06-05 ENCOUNTER — Other Ambulatory Visit: Payer: Self-pay | Admitting: Radiology

## 2017-06-05 NOTE — Progress Notes (Signed)
[name]    HEMATOLOGY/ONCOLOGY CLINIC NOTE  Date of Service: .05/30/2017  Patient Care Team: [name], MD as PCP - General (Family Medicine) [name] MD ([facility] _ primary oncology) phone number [phone] Email- [email or web address]  CHIEF COMPLAINTS/PURPOSE OF CONSULTATION:   followup of multiple myeloma.  HISTORY OF PRESENTING ILLNESS:   Michael [name] is a wonderful 66 y.o. 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 Patient is currently on a study protocol and is being monitored for myeloma recurrence.  Based on available records being put together  -Patient was diagnosed with IgG kappa ISS stage I multiple myeloma with hyperdiploid complex cytogenetics in 2012. Bone marrow biopsy apparently showed 50% kappa restricted plasma cells with an initial M spike of 3.4 g/dL with evidence of lytic lesions on his PET/CT scan including right rib fracture and lesions of the lumbar and cervical spine. -Patient was treated under protocol 11-C-0221 with from 02/21/2013 with  Carfilzomib/Revlimid/Dexamethasone for a total of 8 cycles. He reports that his stem cells were collected and stored after 4 cycles -Posttreatment bone marrow biopsy showed less than 5% plasma cells with a negative flow cytometry and improvement in his previously PET avid lesions. Some concern for new focus of activity at C2 lytic lesion at L4 and several small lesions throughout the vertebrae. -Patient was on maintenance lenalidomide 10 mg by mouth daily for 2 years or more until end of 2014. 08/19/2013 - bone marrow biopsy showed normocellular marrow with less than 5% plasma cells. PET CT scan showed  decreased focal metabolic ligament prior rib lesions and no new lesions. Flow cytometry was negative for residual disease. Serum and urine electrophoresis and IFE showed no evidence of monoclonal protein. -Patient notes that he was off protocol for a few years due to lack of clinical trial research funding. -Patient notes that he is back on a follow-up protocol and continues to follow and receive his primary treatment at [facility] at this time. -Patient reports he had his last PET CT scan blood and urine tests and bone marrow examination in February 2018. The results of which are not available to [place] currently.  He notes that there was concern for a very slowly growing sternal FDG avid lesion that has been present for years. He has a follow-up at [facility] next month to determine the management of this lesion. He reports that was some consideration of considering radiation. he notes that this lesion has not been biopsied. No other focal bone pains at this time.  We discussed in detail about what our role will be and noted that we shall be available for any acute issues that need to be taken care of locally but that at this time the primary treatment and evaluation will be driven by his team at [facility] as per their research protocols and input. The patient and his wife are clearly aware of this and are in agreement with this plan.  INTERVAL HISTORY  Patient comes is here for continued management of his MM. He had a PET/CT on 05/23/2017 which showed A hypermetabolic right sternal body lesion is mildly hyperdense with respect to the surrounding sternal marrow and has maximum SUV of 6.5. No cortical destruction immediately associated with lesion although  below the level of the lesion there is some focal cortical demineralization along the posterior sternum. He notes no acute new symptoms. Still has a unchanged some SPEP spike of 0.3g/dl. We discussed role of BM Bx to determine status of MM ? Bone marrow  recurrence vs single bone lesion?. He is agreeable to this - was ordered. His bilirubin levels bumped to 2.72 from1.3. ?etiology. Recommended avoid excessive Naproxen use, decreasing dose of lipitor and switching Revlimid to 3weeks on/1 week off schedule to minimize drug effects. NO abdominal pain or discomfort.  MEDICAL HISTORY:  Past Medical History:  Diagnosis Date  . Angioedema   . Gastroesophageal reflux disease without esophagitis   . Hyperlipidemia   . Multiple myeloma ([place]) 2012   Treated at [facility]  . Non morbid obesity due to excess calories   . Prediabetes   . Prediabetes   . Unspecified glaucoma(365.9)   . Urticaria, unspecified   Diabetes mellitus Glaucoma Left toes neuropathy GERD  SURGICAL HISTORY: Past Surgical History:  Procedure Laterality Date  . PORTACATH PLACEMENT      SOCIAL HISTORY: Social History   Social History  . Marital status: Married    Spouse name: N/A  . Number of children: 2  . Years of education: N/A   Occupational History  . Retired Chief Strategy Officer for [employer]  . Smoking status: Never Smoker  . Smokeless tobacco: Never Used  . Alcohol use 0.0 oz/week     Comment: Occasional beer  . Drug use: No  . Sexual activity: Not on file   Other Topics Concern  . Not on file   Social History Narrative  . No narrative on file  Never smoker Married Retired from the [employer] in June 2017 and moved to [facility].  FAMILY HISTORY: Family History  Problem Relation Age of Onset  . Cancer Father     ALLERGIES:  has No Known Allergies.  MEDICATIONS:  Current Outpatient Prescriptions  Medication Sig Dispense Refill  . aspirin 81 MG tablet Take 81 mg by mouth daily.    [name] atorvastatin (LIPITOR) 20 MG tablet Take 20 mg by mouth daily.    . Calcium Carbonate-Vitamin D (CALCIUM-D) 600-400 MG-UNIT TABS Take by mouth 2 (two) times daily.    [name] lenalidomide (REVLIMID) 10 MG capsule Take 1 capsule (10  mg total) by mouth daily. Auth #[id number] 28 capsule 0  . naproxen (NAPROSYN) 500 MG tablet Take 500 mg by mouth as needed.     . pantoprazole (PROTONIX) 40 MG tablet Take 40 mg by mouth as needed.      No current facility-administered medications for this visit.     REVIEW OF SYSTEMS:    10 Point review of Systems was done is negative except as noted above.  PHYSICAL EXAMINATION: ECOG PERFORMANCE STATUS: 1 - Symptomatic but completely ambulatory Vital signs reviewed in EPIC GENERAL:alert, in no acute distress and comfortable SKIN: no acute rashes, no significant lesions EYES: conjunctiva are pink and non-injected, sclera anicteric OROPHARYNX: MMM, no exudates, no oropharyngeal erythema or ulceration NECK: supple, no JVD LYMPH:  no palpable lymphadenopathy in the cervical, axillary or inguinal regions LUNGS: clear to auscultation b/l with normal respiratory effort HEART: regular rate & rhythm ABDOMEN:  Obese, normoactive bowel sounds , non tender, not distended. No palpable hepatosplenomegaly Extremity: no pedal edema PSYCH: alert & oriented x 3 with fluent speech NEURO: no focal motor/sensory deficits  LABORATORY DATA:  I have reviewed the  data as listed  . CBC Latest Ref Rng & Units 05/23/2017 05/05/2017 03/20/2017  WBC 4.0 - 10.3 10e3/uL 4.7 4.3 3.9(L)  Hemoglobin 13.0 - 17.1 g/dL 15.4 13.8 13.6  Hematocrit 38.4 - 49.9 % 44.5 40.7 40.2  Platelets 140 - 400 10e3/uL 169 164 161    . CMP Latest Ref Rng & Units 05/23/2017 05/23/2017 05/05/2017  Glucose 70 - 140 mg/dl 113 - 121  BUN 7.0 - 26.0 mg/dL 16.9 - 11.1  Creatinine 0.7 - 1.3 mg/dL 0.8 - 0.8  Sodium 136 - 145 mEq/L 139 - 141  Potassium 3.5 - 5.1 mEq/L 3.7 - 3.8  CO2 22 - 29 mEq/L 25 - 26  Calcium 8.4 - 10.4 mg/dL 9.7 - 8.8  Total Protein 6.0 - 8.5 g/dL 7.5 6.9 6.7  Total Bilirubin 0.20 - 1.20 mg/dL 2.72(H) - 1.30(H)  Alkaline Phos 40 - 150 U/L 84 - 75  AST 5 - 34 U/L 22 - 13  ALT 0 - 55 U/L 33 - 23         Component     Latest Ref Rng & Units 03/20/2017  IgG (Immunoglobin G), Serum     700 - 1,600 mg/dL 815  IgA/Immunoglobulin A, Serum     61 - 437 mg/dL 112  IgM, Qn, Serum     20 - 172 mg/dL 36  Total Protein     6.0 - 8.5 g/dL 6.3  Albumin SerPl Elph-Mcnc     2.9 - 4.4 g/dL 3.7  Alpha 1     0.0 - 0.4 g/dL 0.2  Alpha2 Glob SerPl Elph-Mcnc     0.4 - 1.0 g/dL 0.6  B-Globulin SerPl Elph-Mcnc     0.7 - 1.3 g/dL 1.0  Gamma Glob SerPl Elph-Mcnc     0.4 - 1.8 g/dL 0.8  M Protein SerPl Elph-Mcnc     Not Observed g/dL 0.3 (H)  Globulin, Total     2.2 - 3.9 g/dL 2.6  Albumin/Glob SerPl     0.7 - 1.7 1.5  IFE 1      Comment  Please Note (HCV):      Comment  Ig Kappa Free Light Chain     3.3 - 19.4 mg/L 13.2  Ig Lambda Free Light Chain     5.7 - 26.3 mg/L 7.0  Kappa/Lambda FluidC Ratio     0.26 - 1.65 1.89 (H)  LDH     125 - 245 U/L 170  Beta 2     0.6 - 2.4 mg/L 1.4       RADIOGRAPHIC STUDIES: I have personally reviewed the radiological images as listed and agreed with the findings in the report. Nm Pet Image Restage (ps) Whole Body  Result Date: 05/23/2017 CLINICAL DATA:  Subsequent treatment strategy for multiple myeloma. Prior port the patient was originally diagnosed in 2012 and had lytic lesions on prior outside PET-CT which demonstrated some response to therapy, although there has been concern for a slowly growing sternal lesion. EXAM: NUCLEAR MEDICINE PET WHOLE BODY TECHNIQUE: 13.0 mCi F-18 FDG was injected intravenously. Full-ring PET imaging was performed from the vertex to the feet after the radiotracer. CT data was obtained and used for attenuation correction and anatomic localization. FASTING BLOOD GLUCOSE:  Value: 117 mg/dl COMPARISON:  None. FINDINGS: HEAD/NECK No hypermetabolic activity in the scalp. No hypermetabolic cervical lymph nodes. CHEST No hypermetabolic mediastinal or hilar nodes. Aortic arch and coronary atherosclerotic calcifications. Right  hilar and infrahilar calcified lymph nodes compatible with old granulomatous  disease. No worrisome or hypermetabolic pulmonary nodules identified. Mild cardiomegaly. ABDOMEN/PELVIS Very small focus of accentuated metabolic activity anteriorly in the left hepatic lobe with maximum SUV 6.7, background liver activity typically about 5.2. This could potentially be artifactual but may merit surveillance. Small gallstone noted in the gallbladder. Physiologic activity in bowel. There is calcification in the prostate gland along with accentuated metabolic activity eccentric to the right in the prostate gland with maximum SUV of 8.6. Aortoiliac atherosclerotic vascular disease. SKELETON Mixed density in the sternum, with a right sternal lesion with slightly higher density than background sternal activity having a maximum SUV of 6.5. There is some focal demineralization of the posterior sternal cortex below the level of this lesion and not directly associated with the lesion, for example on image 114/4. Degenerative activity at the left [facility] joint. There are a few right rib deformities suggesting old fractures, for example in the right fifth rib laterally on image 108/4, without associated hypermetabolic activity. There is some vertebral lucencies which are likely primarily from Schmorl' s nodes and degenerative findings rather than myeloma. These are not hypermetabolic. Lower lumbar spondylosis may be causing right eccentric impingement at the L4-5 level. EXTREMITIES No abnormal hypermetabolic activity in the lower extremities. IMPRESSION: 1. A hypermetabolic right sternal body lesion is mildly hyperdense with respect to the surrounding sternal marrow and has maximum SUV of 6.5. No cortical destruction immediately associated with lesion although below the level of the lesion there is some focal cortical demineralization along the posterior sternum. 2. There are few right rib deformities suggesting old healed fractures which  are not hypermetabolic. No definite current lytic rib lesions, and no other hypermetabolic bony lesions identified. 3. A small focus of accentuated metabolic activity anteriorly along the left hepatic lobe may be incidental, and is only slightly above background liver activity, but may merit surveillance. 4. Right eccentric activity in the prostate gland could be due to prostate cancer or inflammation. Correlate with PSA level and any prior prostate workup. 5. Aortic Atherosclerosis (ICD10-I70.0). Coronary atherosclerosis and mild cardiomegaly. 6. Old granulomatous disease in the chest. 7. Lower lumbar spondylosis potentially causing impingement at L4-5. Electronically Signed   By: Van Clines M.D.   On: 05/23/2017 12:14    CT Chest Final Exam: CT Chest Exam Date: 03/02/2017 Accession #: [id number] Ordered By: [name] CLINICAL INDICATION (entered by referring clinician): follow up TECHNIQUE: Multidetector helical (2 mm, dual [id number]) images following vascular contrast administration (181 cm, 120 kg, 2 ml/s, 120 ml [id number]) obtained without apparent complication. 5 mm images also reconstructed from data. Additional history: Multiple myeloma. COMPARISON: Chest CT component of 21 November 2016 PET-CT; no prior comparison diagnostic CT available. FINDINGS: Chest: Scattered bone radiolucencies of various sizes affecting sternum, manubrium, and multiple vertebrae without definite change. Right hilum calcifications and one or few noncalcified lung small foci, such as (0.3 cm) (series 4, image 229) compatible with granulomata. Spine and vascular calcification. Dependent gallbladder calcifications compatible with calculi. Marked upper intra-abdominal fat. No evidence of splenomegaly, pleural or pericardial effusion, bulky axilla, mediastinum, or left hilum adenopathy, or lung consolidation. IMPRESSION: No definite change in bone lesions compatible with history of myeloma,  though comparison limited. Cholelithiasis. Granulomata. 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 Bone marrow biopsy apparently showed 50%  kappa restricted plasma cells with an initial M spike of 3.4 g/dL with evidence of lytic lesions on his PET/CT scan including right rib fracture and lesions of the lumbar and cervical spine. -Patient was treated under protocol 11-C-0221 with from 02/21/2013 with  Carfilzomib/Revlimid/Dexamethasone for a total of 8 cycles. He reports that his stem cells were collected and stored after 4 cycles -Posttreatment bone marrow biopsy showed less than 5% plasma cells with a negative flow cytometry and improvement in his previously PET avid lesions. Some concern for new focus of activity at C2 lytic lesion at L4 and several small lesions throughout the vertebrae. -Patient was on maintenance lenalidomide 10 mg by mouth daily for 2 years or more until end of 2014. 08/19/2013 - bone marrow biopsy showed normocellular marrow with less than 5% plasma cells. PET CT scan showed decreased focal metabolic ligament prior rib lesions and no new lesions. Flow cytometry was negative for residual disease. Serum and urine electrophoresis and IFE showed no evidence of monoclonal protein. -Patient notes that he was off protocol for a few years due to lack of clinical trial research funding. -Patient notes that he is back on a follow-up protocol and continues to follow and receive his primary treatment at [facility] at this time.  -Patient's recent labs show minimal IgG kappa protein on IFE and a CT chest in May 2018 that did not show any overt signs of myeloma progression in the bones.  Rpt Myeloma labs 03/2017 - M spike of 0.3g/dl  2) Elevated bilirubin level  2.72 up from 1.3  Plan  -PET/CT reviewed with the patient. Still has a metabolic active sternal lesion. No other singificant bone activity -M spike persistent and stable at 0.3g/dl -discussed with patient need to re-assess BM to determine status of disease  To determine next steps in management --- continued Revlimid maintenance + Zometa +/- RT to active bone lesion vs re-induction treatment if significant BM  Progression. -Rev -no prohibitive toxicities from Zometa or Revlimid maintenance at this time. -Thus far his overall disease course has been relatively indolent.  -We discussed that if he has serologic or osseous progression he might need to be undergo induction treatment again . -Continue on Revlimid 10 mg by mouth daily for myeloma maintenance. 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 Every monthly until repeat PET scan in 05/2017 with repeat labs. If stable will switch to q3 months. -[facility] as [facility] recommendations in 08/2017 with labs, BM and PET/CT -continue Zometa q4weeks -Revlimid switched to 65m 3 weeks on 1 week due to abnormal LFTs, also recommended hold naproxne use and backing off use of lipitor and other hepatotoxic medications -BM Bx in 1 week  3) PET/CT abnormality in the Prostate -will check PSA on next visit in 2 weeks.  4) Abnormal focus of FDG avid at a spot in the liver - will need to be monitor on f/u scans. No pain or other symptoms at this time.  Bone marrow biopsy in 1 week RTC with Dr KIrene Limboin 2-3 weeks with labs  All of the patients questions were answered with apparent satisfaction. The patient knows to call the clinic with any problems, questions or concerns.  I spent 25 minutes counseling the patient face to face. The total time spent in the appointment was 40 minutes and more than 50% was on counseling and direct patient cares.    GSullivan LoneMD MSanta RitaAAHIVMS SSonoma Developmental CenterCOutpatient CarecenterHematology/Oncology Physician CBonita  Th[place]ston  (Office):       [phone] (Work cell):  [phone] (Fax):           [phone]  .05/30/2017

## 2017-06-06 ENCOUNTER — Ambulatory Visit (HOSPITAL_COMMUNITY)
Admission: RE | Admit: 2017-06-06 | Discharge: 2017-06-06 | Disposition: A | Discharge status: Home / Self Care | Payer: Medicare Other | Source: Ambulatory Visit | Attending: Hematology | Admitting: Hematology

## 2017-06-06 ENCOUNTER — Encounter (HOSPITAL_COMMUNITY): Payer: Self-pay

## 2017-06-06 DIAGNOSIS — H409 Unspecified glaucoma: Secondary | ICD-10-CM | POA: Insufficient documentation

## 2017-06-06 DIAGNOSIS — E785 Hyperlipidemia, unspecified: Secondary | ICD-10-CM | POA: Diagnosis not present

## 2017-06-06 DIAGNOSIS — Z79899 Other long term (current) drug therapy: Secondary | ICD-10-CM | POA: Insufficient documentation

## 2017-06-06 DIAGNOSIS — K219 Gastro-esophageal reflux disease without esophagitis: Secondary | ICD-10-CM | POA: Insufficient documentation

## 2017-06-06 DIAGNOSIS — C9002 Multiple myeloma in relapse: Secondary | ICD-10-CM | POA: Diagnosis not present

## 2017-06-06 DIAGNOSIS — R7303 Prediabetes: Secondary | ICD-10-CM | POA: Insufficient documentation

## 2017-06-06 DIAGNOSIS — D7589 Other specified diseases of blood and blood-forming organs: Secondary | ICD-10-CM | POA: Diagnosis not present

## 2017-06-06 DIAGNOSIS — Z7982 Long term (current) use of aspirin: Secondary | ICD-10-CM | POA: Diagnosis not present

## 2017-06-06 DIAGNOSIS — C9000 Multiple myeloma not having achieved remission: Secondary | ICD-10-CM | POA: Diagnosis not present

## 2017-06-06 LAB — PROTIME-INR
INR: 1.05
PROTHROMBIN TIME: 13.7 s (ref 11.4–15.2)

## 2017-06-06 LAB — CBC WITH DIFFERENTIAL/PLATELET
BASOS ABS: 0.1 10*3/uL (ref 0.0–0.1)
BASOS PCT: 2 %
EOS ABS: 0.2 10*3/uL (ref 0.0–0.7)
Eosinophils Relative: 6 %
HEMATOCRIT: 42 % (ref 39.0–52.0)
HEMOGLOBIN: 14.4 g/dL (ref 13.0–17.0)
Lymphocytes Relative: 33 %
Lymphs Abs: 1.4 10*3/uL (ref 0.7–4.0)
MCH: 30.3 pg (ref 26.0–34.0)
MCHC: 34.3 g/dL (ref 30.0–36.0)
MCV: 88.2 fL (ref 78.0–100.0)
MONOS PCT: 9 %
Monocytes Absolute: 0.4 10*3/uL (ref 0.1–1.0)
NEUTROS ABS: 2.1 10*3/uL (ref 1.7–7.7)
NEUTROS PCT: 50 %
Platelets: 193 10*3/uL (ref 150–400)
RBC: 4.76 MIL/uL (ref 4.22–5.81)
RDW: 13.7 % (ref 11.5–15.5)
WBC: 4.2 10*3/uL (ref 4.0–10.5)

## 2017-06-06 LAB — APTT: APTT: 26 s (ref 24–36)

## 2017-06-06 MED ORDER — FENTANYL CITRATE (PF) 100 MCG/2ML IJ SOLN
INTRAMUSCULAR | Status: AC
  Filled 2017-06-06: qty 4

## 2017-06-06 MED ORDER — LIDOCAINE HCL (PF) 1 % IJ SOLN
INTRAMUSCULAR | Status: AC | PRN
  Administered 2017-06-06: 10 mL via INTRADERMAL

## 2017-06-06 MED ORDER — SODIUM CHLORIDE 0.9 % IV SOLN
INTRAVENOUS | Status: DC
  Administered 2017-06-06: 09:00:00 via INTRAVENOUS

## 2017-06-06 MED ORDER — FENTANYL CITRATE (PF) 100 MCG/2ML IJ SOLN
INTRAMUSCULAR | Status: AC | PRN
  Administered 2017-06-06 (×2): 50 ug via INTRAVENOUS

## 2017-06-06 MED ORDER — MIDAZOLAM HCL 2 MG/2ML IJ SOLN
INTRAMUSCULAR | Status: AC | PRN
  Administered 2017-06-06 (×3): 1 mg via INTRAVENOUS

## 2017-06-06 MED ORDER — MIDAZOLAM HCL 2 MG/2ML IJ SOLN
INTRAMUSCULAR | Status: AC
  Filled 2017-06-06: qty 4

## 2017-06-06 NOTE — Procedures (Signed)
R iliac BM aspirate and core EBL 0 Comp 0 

## 2017-06-06 NOTE — Sedation Documentation (Signed)
Patient denies pain and is resting comfortably.  

## 2017-06-06 NOTE — Discharge Instructions (Signed)
Moderate Conscious Sedation, Adult, Care After °These instructions provide you with information about caring for yourself after your procedure. Your health care provider may also give you more specific instructions. Your treatment has been planned according to current medical practices, but problems sometimes occur. Call your health care provider if you have any problems or questions after your procedure. °What can I expect after the procedure? °After your procedure, it is common: °· To feel sleepy for several hours. °· To feel clumsy and have poor balance for several hours. °· To have poor judgment for several hours. °· To vomit if you eat too soon. ° °Follow these instructions at home: °For at least 24 hours after the procedure: ° °· Do not: °? Participate in activities where you could fall or become injured. °? Drive. °? Use heavy machinery. °? Drink alcohol. °? Take sleeping pills or medicines that cause drowsiness. °? Make important decisions or sign legal documents. °? Take care of children on your own. °· Rest. °Eating and drinking °· Follow the diet recommended by your health care provider. °· If you vomit: °? Drink water, juice, or soup when you can drink without vomiting. °? Make sure you have little or no nausea before eating solid foods. °General instructions °· Have a responsible adult stay with you until you are awake and alert. °· Take over-the-counter and prescription medicines only as told by your health care provider. °· If you smoke, do not smoke without supervision. °· Keep all follow-up visits as told by your health care provider. This is important. °Contact a health care provider if: °· You keep feeling nauseous or you keep vomiting. °· You feel light-headed. °· You develop a rash. °· You have a fever. °Get help right away if: °· You have trouble breathing. °This information is not intended to replace advice given to you by your health care provider. Make sure you discuss any questions you have  with your health care provider. °Document Released: 07/24/2013 Document Revised: 03/07/2016 Document Reviewed: 01/23/2016 °Elsevier Interactive Patient Education © 2018 [employer] °Bone Marrow Aspiration and Bone Marrow Biopsy, Adult, Care After °This sheet gives you information about how to care for yourself after your procedure. Your health care provider may also give you more specific instructions. If you have problems or questions, contact your health care provider. °What can I expect after the procedure? °After the procedure, it is common to have: °· Mild pain and tenderness. °· Swelling. °· Bruising. ° °Follow these instructions at home: °· Take over-the-counter or prescription medicines only as told by your health care provider. °· Do not take baths, swim, or use a hot tub until your health care provider approves. Ask if you can take a shower or have a sponge bath. °· Follow instructions from your health care provider about how to take care of the puncture site. Make sure you: °? Wash your hands with soap and water before you change your bandage (dressing). If soap and water are not available, use hand sanitizer. °? Change your dressing as told by your health care provider. °· Check your puncture site every day for signs of infection. Check for: °? More redness, swelling, or pain. °? More fluid or blood. °? Warmth. °? Pus or a bad smell. °· Return to your normal activities as told by your health care provider. Ask your health care provider what activities are safe for you. °· Do not drive for 24 hours if you were given a medicine to help you relax (sedative). °·   Keep all follow-up visits as told by your health care provider. This is important. °Contact a health care provider if: °· You have more redness, swelling, or pain around the puncture site. °· You have more fluid or blood coming from the puncture site. °· Your puncture site feels warm to the touch. °· You have pus or a bad smell coming from the  puncture site. °· You have a fever. °· Your pain is not controlled with medicine. °This information is not intended to replace advice given to you by your health care provider. Make sure you discuss any questions you have with your health care provider. °Document Released: 04/22/2005 Document Revised: 04/22/2016 Document Reviewed: 03/16/2016 °Elsevier Interactive Patient Education © 2018 [employer] ° °

## 2017-06-06 NOTE — H&P (Signed)
Chief Complaint: Patient was seen in consultation today for multiple myeloma  Referring Physician(s): [name]  Supervising Physician: Marybelle Killings  Patient Status: [facility] - Out-pt  History of Present Illness: Michael [name] is a 66 y.o. male with past medical history of GERD, HLD, angioedema, and multiple myeloma who is being monitored for myeloma recurrence.   IR consulted for bone marrow biopsy at the request of Dr. Irene Limbo.   Patient presents for procedure today in his usual state of health.  He has been NPO.  He does not take blood thinners.   Past Medical History:  Diagnosis Date  . Angioedema   . Gastroesophageal reflux disease without esophagitis   . Hyperlipidemia   . Multiple myeloma ([place]) 2012   Treated at [place]  . Non morbid obesity due to excess calories   . Prediabetes   . Prediabetes   . Unspecified glaucoma(365.9)   . Urticaria, unspecified     Past Surgical History:  Procedure Laterality Date  . PORTACATH PLACEMENT      Allergies: Patient has no known allergies.  Medications: Prior to Admission medications   Medication Sig Start Date End Date Taking? Authorizing Provider  aspirin 81 MG tablet Take 81 mg by mouth daily.    [provider]  atorvastatin (LIPITOR) 20 MG tablet Take 20 mg by mouth daily.    [provider]  Calcium Carbonate-Vitamin D (CALCIUM-D) 600-400 MG-UNIT TABS Take by mouth 2 (two) times daily.    [provider]  lenalidomide (REVLIMID) 10 MG capsule Take 1 capsule (10 mg total) by mouth daily. Josem Kaufmann #[id number] 05/30/17   [name], MD  naproxen (NAPROSYN) 500 MG tablet Take 500 mg by mouth as needed.     [name], MD  pantoprazole (PROTONIX) 40 MG tablet Take 40 mg by mouth as needed.     [provider]     Family History  Problem Relation Age of Onset  . Cancer Father     Social History   Social History  . Marital status: Married    Spouse name: N/A  . Number  of children: 2  . Years of education: N/A   Occupational History  . Retired Chief Strategy Officer for [employer]  . Smoking status: Never Smoker  . Smokeless tobacco: Never Used  . Alcohol use 0.0 oz/week     Comment: Occasional beer  . Drug use: No  . Sexual activity: Not Asked   Other Topics Concern  . None   Social History Narrative  . None    Review of Systems  Constitutional: Negative for fatigue and fever.  Respiratory: Negative for cough and shortness of breath.   Cardiovascular: Negative for chest pain.  Gastrointestinal: Negative for abdominal pain.  Psychiatric/Behavioral: Negative for behavioral problems and confusion.    Vital Signs: BP 129/68 (BP Location: Left Arm)   Pulse (!) 55   Temp 98.2 F (36.8 C) (Oral)   Resp 16   Ht 6' 1" (1.854 m)   Wt 253 lb 6.4 oz (114.9 kg)   SpO2 98%   BMI 33.43 kg/m   Physical Exam  Constitutional: He is oriented to person, place, and time. He appears well-developed.  Cardiovascular: Normal rate, regular rhythm and normal heart sounds.   Pulmonary/Chest: Effort normal and breath sounds normal.  Neurological: He is alert and oriented to person, place, and time.  Skin: Skin is warm and dry.  Psychiatric: He has a normal  mood and affect. His behavior is normal. Judgment and thought content normal.  Nursing note and vitals reviewed.   Mallampati Score:  MD Evaluation Airway: WNL Heart: WNL Abdomen: WNL Chest/ Lungs: WNL ASA  Classification: 3 Mallampati/Airway Score: Two  Imaging: Nm Pet Image Restage (ps) Whole Body  Result Date: 05/23/2017 CLINICAL DATA:  Subsequent treatment strategy for multiple myeloma. Prior port the patient was originally diagnosed in 2012 and had lytic lesions on prior outside PET-CT which demonstrated some response to therapy, although there has been concern for a slowly growing sternal lesion. EXAM: NUCLEAR MEDICINE PET WHOLE BODY TECHNIQUE: 13.0 mCi F-18 FDG was  injected intravenously. Full-ring PET imaging was performed from the vertex to the feet after the radiotracer. CT data was obtained and used for attenuation correction and anatomic localization. FASTING BLOOD GLUCOSE:  Value: 117 mg/dl COMPARISON:  None. FINDINGS: HEAD/NECK No hypermetabolic activity in the scalp. No hypermetabolic cervical lymph nodes. CHEST No hypermetabolic mediastinal or hilar nodes. Aortic arch and coronary atherosclerotic calcifications. Right hilar and infrahilar calcified lymph nodes compatible with old granulomatous disease. No worrisome or hypermetabolic pulmonary nodules identified. Mild cardiomegaly. ABDOMEN/PELVIS Very small focus of accentuated metabolic activity anteriorly in the left hepatic lobe with maximum SUV 6.7, background liver activity typically about 5.2. This could potentially be artifactual but may merit surveillance. Small gallstone noted in the gallbladder. Physiologic activity in bowel. There is calcification in the prostate gland along with accentuated metabolic activity eccentric to the right in the prostate gland with maximum SUV of 8.6. Aortoiliac atherosclerotic vascular disease. SKELETON Mixed density in the sternum, with a right sternal lesion with slightly higher density than background sternal activity having a maximum SUV of 6.5. There is some focal demineralization of the posterior sternal cortex below the level of this lesion and not directly associated with the lesion, for example on image 114/4. Degenerative activity at the left [facility] joint. There are a few right rib deformities suggesting old fractures, for example in the right fifth rib laterally on image 108/4, without associated hypermetabolic activity. There is some vertebral lucencies which are likely primarily from Schmorl' s nodes and degenerative findings rather than myeloma. These are not hypermetabolic. Lower lumbar spondylosis may be causing right eccentric impingement at the L4-5 level.  EXTREMITIES No abnormal hypermetabolic activity in the lower extremities. IMPRESSION: 1. A hypermetabolic right sternal body lesion is mildly hyperdense with respect to the surrounding sternal marrow and has maximum SUV of 6.5. No cortical destruction immediately associated with lesion although below the level of the lesion there is some focal cortical demineralization along the posterior sternum. 2. There are few right rib deformities suggesting old healed fractures which are not hypermetabolic. No definite current lytic rib lesions, and no other hypermetabolic bony lesions identified. 3. A small focus of accentuated metabolic activity anteriorly along the left hepatic lobe may be incidental, and is only slightly above background liver activity, but may merit surveillance. 4. Right eccentric activity in the prostate gland could be due to prostate cancer or inflammation. Correlate with PSA level and any prior prostate workup. 5. Aortic Atherosclerosis (ICD10-I70.0). Coronary atherosclerosis and mild cardiomegaly. 6. Old granulomatous disease in the chest. 7. Lower lumbar spondylosis potentially causing impingement at L4-5. Electronically Signed   By: Van Clines M.D.   On: 05/23/2017 12:14    Labs:  CBC:  Recent Labs  03/20/17 1341 05/05/17 0836 05/23/17 1147 06/06/17 0920  WBC 3.9* 4.3 4.7 4.2  HGB 13.6 13.8 15.4 14.4  HCT 40.2  40.7 44.5 42.0  PLT 161 164 169 193    COAGS:  Recent Labs  06/06/17 0920  INR 1.05  APTT 26    BMP:  Recent Labs  03/20/17 1341 05/05/17 0836 05/23/17 1147  NA 141 141 139  K 4.1 3.8 3.7  CO2 _0 GLUCOSE 117 121 113  BUN 14.3 11.1 16.9  CALCIUM 9.3 8.8 9.7  CREATININE 0.8 0.8 0.8    LIVER FUNCTION TESTS:  Recent Labs  03/20/17 1341 03/20/17 1341 05/05/17 0836 05/23/17 1147  BILITOT 1.00  --  1.30* 2.72*  AST 17  --  13 22  ALT 24  --  23 33  ALKPHOS 79  --  75 84  PROT 6.6 6.3 6.7 7.5  6.9  ALBUMIN 4.0  --  3.8 4.4     TUMOR MARKERS: No results for input(s): AFPTM, CEA, CA199, CHROMGRNA in the last 8760 hours.  Assessment and Plan: Patient with past medical history of multiple myeloma presents for bone marrow biopsy for disease monitoring at the request of Dr. Irene Limbo.  Patient presents today in their usual state of health.  He has been NPO and is not currently on blood thinners.  Risks and benefits discussed with the patient including, but not limited to bleeding, infection, damage to adjacent structures or low yield requiring additional tests. All of the patient's questions were answered, patient is agreeable to proceed. Consent signed and in chart.   Thank you for this interesting consult.  I greatly enjoyed meeting [facility] and look forward to participating in their care.  A copy of this report was sent to the requesting provider on this date.  Electronically Signed: Docia Barrier, PA 06/06/2017, 10:45 AM   I spent a total of  30 Minutes   in face to face in clinical consultation, greater than 50% of which was counseling/coordinating care for multiple myeloma.

## 2017-06-13 ENCOUNTER — Encounter: Payer: Self-pay | Admitting: Hematology

## 2017-06-13 ENCOUNTER — Ambulatory Visit (HOSPITAL_BASED_OUTPATIENT_CLINIC_OR_DEPARTMENT_OTHER): Payer: Medicare Other | Admitting: Hematology

## 2017-06-13 ENCOUNTER — Telehealth: Payer: Self-pay | Admitting: Hematology

## 2017-06-13 ENCOUNTER — Other Ambulatory Visit (HOSPITAL_BASED_OUTPATIENT_CLINIC_OR_DEPARTMENT_OTHER): Payer: Medicare Other

## 2017-06-13 VITALS — BP 137/82 | HR 59 | Temp 98.8°F | Resp 19 | Ht 73.0 in | Wt 250.1 lb

## 2017-06-13 DIAGNOSIS — C9002 Multiple myeloma in relapse: Secondary | ICD-10-CM

## 2017-06-13 DIAGNOSIS — C9000 Multiple myeloma not having achieved remission: Secondary | ICD-10-CM | POA: Diagnosis present

## 2017-06-13 DIAGNOSIS — N429 Disorder of prostate, unspecified: Secondary | ICD-10-CM

## 2017-06-13 LAB — COMPREHENSIVE METABOLIC PANEL
ALT: 22 U/L (ref 0–55)
ANION GAP: 6 meq/L (ref 3–11)
AST: 18 U/L (ref 5–34)
Albumin: 3.9 g/dL (ref 3.5–5.0)
Alkaline Phosphatase: 69 U/L (ref 40–150)
BUN: 12.7 mg/dL (ref 7.0–26.0)
CALCIUM: 9.3 mg/dL (ref 8.4–10.4)
CHLORIDE: 108 meq/L (ref 98–109)
CO2: 26 mEq/L (ref 22–29)
Creatinine: 0.8 mg/dL (ref 0.7–1.3)
EGFR: >90 mL/min/{1.73_m2} (ref >90)
Glucose: 103 mg/dl (ref 70–140)
POTASSIUM: 3.9 meq/L (ref 3.5–5.1)
Sodium: 140 mEq/L (ref 136–145)
Total Bilirubin: 1.2 mg/dL (ref 0.20–1.20)
Total Protein: 6.9 g/dL (ref 6.4–8.3)

## 2017-06-13 LAB — CBC & DIFF AND RETIC
BASO%: 2.6 % — AB (ref 0.0–2.0)
BASOS ABS: 0.1 10*3/uL (ref 0.0–0.1)
EOS%: 2 % (ref 0.0–7.0)
Eosinophils Absolute: 0.1 10*3/uL (ref 0.0–0.5)
HEMATOCRIT: 42.9 % (ref 38.4–49.9)
HGB: 14.4 g/dL (ref 13.0–17.1)
Immature Retic Fract: 0.7 % — ABNORMAL LOW (ref 3.00–10.60)
LYMPH#: 1.6 10*3/uL (ref 0.9–3.3)
LYMPH%: 34.7 % (ref 14.0–49.0)
MCH: 30.3 pg (ref 27.2–33.4)
MCHC: 33.6 g/dL (ref 32.0–36.0)
MCV: 90.1 fL (ref 79.3–98.0)
MONO#: 0.5 10*3/uL (ref 0.1–0.9)
MONO%: 11.9 % (ref 0.0–14.0)
NEUT#: 2.2 10*3/uL (ref 1.5–6.5)
NEUT%: 48.8 % (ref 39.0–75.0)
PLATELETS: 159 10*3/uL (ref 140–400)
RBC: 4.76 10*6/uL (ref 4.20–5.82)
RDW: 13.6 % (ref 11.0–14.6)
RETIC %: 1.05 % (ref 0.80–1.80)
RETIC CT ABS: 49.98 10*3/uL (ref 34.80–93.90)
WBC: 4.5 10*3/uL (ref 4.0–10.3)

## 2017-06-13 NOTE — Telephone Encounter (Signed)
Gave patient AVS and calendar for upcoming October appointments.

## 2017-06-14 ENCOUNTER — Telehealth: Payer: Self-pay

## 2017-06-14 LAB — PSA: PROSTATE SPECIFIC AG, SERUM: 1.3 ng/mL (ref 0.0–4.0)

## 2017-06-14 NOTE — Telephone Encounter (Signed)
Communicated to pt that Dr. Irene Limbo okay to f/u every two months from now on. Pt had questioned because of potential need for blood. Will do lab work and f/u at the next visit on 07/25/17. Pt to call with any concerns in the meantime. Pt verbalized understanding.

## 2017-06-20 ENCOUNTER — Ambulatory Visit: Payer: Medicare Other

## 2017-06-20 NOTE — Progress Notes (Signed)
[name]    HEMATOLOGY/ONCOLOGY CLINIC NOTE  Date of Service: .06/13/2017  Patient Care Team: [name], MD as PCP - General (Family Medicine) [name] MD ([facility] _ primary oncology) phone number [phone] Email- [email or web address]  CHIEF COMPLAINTS/PURPOSE OF CONSULTATION:   followup of multiple myeloma.  HISTORY OF PRESENTING ILLNESS:   Michael [name] is a wonderful 66 y.o. 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 Patient is currently on a study protocol and is being monitored for myeloma recurrence.  Based on available records being put together  -Patient was diagnosed with IgG kappa ISS stage I multiple myeloma with hyperdiploid complex cytogenetics in 2012. Bone marrow biopsy apparently showed 50% kappa restricted plasma cells with an initial M spike of 3.4 g/dL with evidence of lytic lesions on his PET/CT scan including right rib fracture and lesions of the lumbar and cervical spine. -Patient was treated under protocol 11-C-0221 with from 02/21/2013 with  Carfilzomib/Revlimid/Dexamethasone for a total of 8 cycles. He reports that his stem cells were collected and stored after 4 cycles -Posttreatment bone marrow biopsy showed less than 5% plasma cells with a negative flow cytometry and improvement in his previously PET avid lesions. Some concern for new focus of activity at C2 lytic lesion at L4 and several small lesions throughout the vertebrae. -Patient was on maintenance lenalidomide 10 mg by mouth daily for 2 years or more until end of 2014. 08/19/2013 - bone marrow biopsy showed normocellular marrow with less than 5% plasma cells. PET CT scan showed  decreased focal metabolic ligament prior rib lesions and no new lesions. Flow cytometry was negative for residual disease. Serum and urine electrophoresis and IFE showed no evidence of monoclonal protein. -Patient notes that he was off protocol for a few years due to lack of clinical trial research funding. -Patient notes that he is back on a follow-up protocol and continues to follow and receive his primary treatment at [facility] at this time. -Patient reports he had his last PET CT scan blood and urine tests and bone marrow examination in February 2018. The results of which are not available to [place] currently.  He notes that there was concern for a very slowly growing sternal FDG avid lesion that has been present for years. He has a follow-up at [facility] next month to determine the management of this lesion. He reports that was some consideration of considering radiation. he notes that this lesion has not been biopsied. No other focal bone pains at this time.  We discussed in detail about what our role will be and noted that we shall be available for any acute issues that need to be taken care of locally but that at this time the primary treatment and evaluation will be driven by his team at [facility] as per their research protocols and input. The patient and his wife are clearly aware of this and are in agreement with this plan.  INTERVAL HISTORY  Patient comes is here for continued management of his MM  And follow-up of his bone marrow biopsy results and repeat labs. His hyperbilirubinemia has resolved after switching his Revlimid to 3 weeks on 1 week off. Bone marrow biopsy shows no overt bone marrow recurrence of his  multiple myeloma. He has no other acute new symptoms. We discussed options of continued maintenance Revlimid with close monitoring plus Zometa every 8 weeks. We discussed pros versus cons of possible RT to the hypermetabolic sternal lesion. He would like to hold off on this since this is  asymptomatic and has been relatively stable over a period of time. Wife was present with the patient for this discussion.   MEDICAL HISTORY:  Past Medical History:  Diagnosis Date  . Angioedema   . Gastroesophageal reflux disease without esophagitis   . Hyperlipidemia   . Multiple myeloma ([place]) 2012   Treated at [facility]  . Non morbid obesity due to excess calories   . Prediabetes   . Prediabetes   . Unspecified glaucoma(365.9)   . Urticaria, unspecified   Diabetes mellitus Glaucoma Left toes neuropathy GERD  SURGICAL HISTORY: Past Surgical History:  Procedure Laterality Date  . PORTACATH PLACEMENT      SOCIAL HISTORY: Social History   Social History  . Marital status: Married    Spouse name: N/A  . Number of children: 2  . Years of education: N/A   Occupational History  . Retired Chief Strategy Officer for [employer]  . Smoking status: Never Smoker  . Smokeless tobacco: Never Used  . Alcohol use 0.0 oz/week     Comment: Occasional beer  . Drug use: No  . Sexual activity: Not on file   Other Topics Concern  . Not on file   Social History Narrative  . No narrative on file  Never smoker Married Retired from the [employer] in June 2017 and moved to [facility].  FAMILY HISTORY: Family History  Problem Relation Age of Onset  . Cancer Father     ALLERGIES:  has No Known Allergies.  MEDICATIONS:  Current Outpatient Prescriptions  Medication Sig Dispense Refill  . vitamin B-12 (CYANOCOBALAMIN) 1000 MCG tablet Take 1,000 mcg by mouth daily.    [name] aspirin 81 MG tablet Take 81 mg by mouth daily.    [name] atorvastatin (LIPITOR) 20 MG tablet Take 20 mg by mouth daily.    . Calcium Carbonate-Vitamin D (CALCIUM-D) 600-400 MG-UNIT TABS Take by mouth 2 (two) times daily.    [name] lenalidomide (REVLIMID) 10 MG capsule Take 1 capsule (10 mg total) by mouth daily. Auth #[id number] 28 capsule 0  . naproxen (NAPROSYN) 500 MG tablet Take 500 mg by  mouth as needed.     . pantoprazole (PROTONIX) 40 MG tablet Take 40 mg by mouth as needed.      No current facility-administered medications for this visit.     REVIEW OF SYSTEMS:    10 Point review of Systems was done is negative except as noted above.  PHYSICAL EXAMINATION: ECOG PERFORMANCE STATUS: 1 - Symptomatic but completely ambulatory Vital signs reviewed in EPIC GENERAL:alert, in no acute distress and comfortable SKIN: no acute rashes, no significant lesions EYES: conjunctiva are pink and non-injected, sclera anicteric OROPHARYNX: MMM, no exudates, no oropharyngeal erythema or ulceration NECK: supple, no JVD LYMPH:  no palpable lymphadenopathy in the cervical, axillary or inguinal regions LUNGS: clear to auscultation b/l with normal respiratory effort HEART: regular rate & rhythm ABDOMEN:  Obese, normoactive bowel sounds , non tender, not distended. No palpable hepatosplenomegaly Extremity: no pedal edema PSYCH: alert & oriented x 3 with fluent speech NEURO: no focal motor/sensory deficits  LABORATORY DATA:  I have reviewed the data as listed  . CBC Latest Ref  Rng & Units 06/13/2017 06/06/2017 05/23/2017  WBC 4.0 - 10.3 10e3/uL 4.5 4.2 4.7  Hemoglobin 13.0 - 17.1 g/dL 14.4 14.4 15.4  Hematocrit 38.4 - 49.9 % 42.9 42.0 44.5  Platelets 140 - 400 10e3/uL 159 193 169    . CMP Latest Ref Rng & Units 06/13/2017 05/23/2017 05/23/2017  Glucose 70 - 140 mg/dl 103 113 -  BUN 7.0 - 26.0 mg/dL 12.7 16.9 -  Creatinine 0.7 - 1.3 mg/dL 0.8 0.8 -  Sodium 136 - 145 mEq/L 140 139 -  Potassium 3.5 - 5.1 mEq/L 3.9 3.7 -  CO2 22 - 29 mEq/L 26 25 -  Calcium 8.4 - 10.4 mg/dL 9.3 9.7 -  Total Protein 6.4 - 8.3 g/dL 6.9 7.5 6.9  Total Bilirubin 0.20 - 1.20 mg/dL 1.20 2.72(H) -  Alkaline Phos 40 - 150 U/L 69 84 -  AST 5 - 34 U/L 18 22 -  ALT 0 - 55 U/L 22 33 -                 RADIOGRAPHIC STUDIES: I have personally reviewed the radiological images as listed and agreed with the  findings in the report. Nm Pet Image Restage (ps) Whole Body  Result Date: 05/23/2017 CLINICAL DATA:  Subsequent treatment strategy for multiple myeloma. Prior port the patient was originally diagnosed in 2012 and had lytic lesions on prior outside PET-CT which demonstrated some response to therapy, although there has been concern for a slowly growing sternal lesion. EXAM: NUCLEAR MEDICINE PET WHOLE BODY TECHNIQUE: 13.0 mCi F-18 FDG was injected intravenously. Full-ring PET imaging was performed from the vertex to the feet after the radiotracer. CT data was obtained and used for attenuation correction and anatomic localization. FASTING BLOOD GLUCOSE:  Value: 117 mg/dl COMPARISON:  None. FINDINGS: HEAD/NECK No hypermetabolic activity in the scalp. No hypermetabolic cervical lymph nodes. CHEST No hypermetabolic mediastinal or hilar nodes. Aortic arch and coronary atherosclerotic calcifications. Right hilar and infrahilar calcified lymph nodes compatible with old granulomatous disease. No worrisome or hypermetabolic pulmonary nodules identified. Mild cardiomegaly. ABDOMEN/PELVIS Very small focus of accentuated metabolic activity anteriorly in the left hepatic lobe with maximum SUV 6.7, background liver activity typically about 5.2. This could potentially be artifactual but may merit surveillance. Small gallstone noted in the gallbladder. Physiologic activity in bowel. There is calcification in the prostate gland along with accentuated metabolic activity eccentric to the right in the prostate gland with maximum SUV of 8.6. Aortoiliac atherosclerotic vascular disease. SKELETON Mixed density in the sternum, with a right sternal lesion with slightly higher density than background sternal activity having a maximum SUV of 6.5. There is some focal demineralization of the posterior sternal cortex below the level of this lesion and not directly associated with the lesion, for example on image 114/4. Degenerative activity at  the left [facility] joint. There are a few right rib deformities suggesting old fractures, for example in the right fifth rib laterally on image 108/4, without associated hypermetabolic activity. There is some vertebral lucencies which are likely primarily from Schmorl' s nodes and degenerative findings rather than myeloma. These are not hypermetabolic. Lower lumbar spondylosis may be causing right eccentric impingement at the L4-5 level. EXTREMITIES No abnormal hypermetabolic activity in the lower extremities. IMPRESSION: 1. A hypermetabolic right sternal body lesion is mildly hyperdense with respect to the surrounding sternal marrow and has maximum SUV of 6.5. No cortical destruction immediately associated with lesion although below the level of the lesion there is some focal cortical demineralization  along the posterior sternum. 2. There are few right rib deformities suggesting old healed fractures which are not hypermetabolic. No definite current lytic rib lesions, and no other hypermetabolic bony lesions identified. 3. A small focus of accentuated metabolic activity anteriorly along the left hepatic lobe may be incidental, and is only slightly above background liver activity, but may merit surveillance. 4. Right eccentric activity in the prostate gland could be due to prostate cancer or inflammation. Correlate with PSA level and any prior prostate workup. 5. Aortic Atherosclerosis (ICD10-I70.0). Coronary atherosclerosis and mild cardiomegaly. 6. Old granulomatous disease in the chest. 7. Lower lumbar spondylosis potentially causing impingement at L4-5. Electronically Signed   By: Van Clines M.D.   On: 05/23/2017 12:14   Ct Biopsy  Result Date: 06/06/2017 INDICATION: Multiple myeloma EXAM: CT BIOPSY; CT BONE MARROW BIOPSY AND ASPIRATION MEDICATIONS: None. ANESTHESIA/SEDATION: Fentanyl 100 mcg IV; Versed Three mg IV Moderate Sedation Time:  13 The patient was continuously monitored during the procedure by the  interventional radiology nurse under my direct supervision. FLUOROSCOPY TIME:  None. COMPLICATIONS: None immediate. PROCEDURE: Informed written consent was obtained from the patient after a thorough discussion of the procedural risks, benefits and alternatives. All questions were addressed. Maximal Sterile Barrier Technique was utilized including caps, mask, sterile gowns, sterile gloves, sterile drape, hand hygiene and skin antiseptic. A timeout was performed prior to the initiation of the procedure. Under CT guidance, an 11 gauge needle was advanced into the right iliac bone. Aspirates and a core were obtained. Post biopsy images demonstrate no hemorrhage. Patient tolerated the procedure well without complication. Vital sign monitoring by nursing staff during the procedure will continue as patient is in the special procedures unit for post procedure observation. FINDINGS: The images document guide needle placement within the right iliac bone. Post biopsy images demonstrate no hemorrhage. IMPRESSION: Successful CT-guided right iliac bone marrow aspirate and core. Electronically Signed   By: Marybelle Killings M.D.   On: 06/06/2017 12:58   Ct Bone Marrow Biopsy & Aspiration  Result Date: 06/06/2017 INDICATION: Multiple myeloma EXAM: CT BIOPSY; CT BONE MARROW BIOPSY AND ASPIRATION MEDICATIONS: None. ANESTHESIA/SEDATION: Fentanyl 100 mcg IV; Versed Three mg IV Moderate Sedation Time:  13 The patient was continuously monitored during the procedure by the interventional radiology nurse under my direct supervision. FLUOROSCOPY TIME:  None. COMPLICATIONS: None immediate. PROCEDURE: Informed written consent was obtained from the patient after a thorough discussion of the procedural risks, benefits and alternatives. All questions were addressed. Maximal Sterile Barrier Technique was utilized including caps, mask, sterile gowns, sterile gloves, sterile drape, hand hygiene and skin antiseptic. A timeout was performed prior to  the initiation of the procedure. Under CT guidance, an 11 gauge needle was advanced into the right iliac bone. Aspirates and a core were obtained. Post biopsy images demonstrate no hemorrhage. Patient tolerated the procedure well without complication. Vital sign monitoring by nursing staff during the procedure will continue as patient is in the special procedures unit for post procedure observation. FINDINGS: The images document guide needle placement within the right iliac bone. Post biopsy images demonstrate no hemorrhage. IMPRESSION: Successful CT-guided right iliac bone marrow aspirate and core. Electronically Signed   By: Marybelle Killings M.D.   On: 06/06/2017 12:58    CT Chest Final Exam: CT Chest Exam Date: 03/02/2017 Accession #: [id number] Ordered By: [name] CLINICAL INDICATION (entered by referring clinician): follow up TECHNIQUE: Multidetector helical (2 mm, dual [id number]) images following vascular contrast administration (181 cm, 120 kg,  2 ml/s, 120 ml [id number]-300) obtained without apparent complication. 5 mm images also reconstructed from data. Additional history: Multiple myeloma. COMPARISON: Chest CT component of 21 November 2016 PET-CT; no prior comparison diagnostic CT available. FINDINGS: Chest: Scattered bone radiolucencies of various sizes affecting sternum, manubrium, and multiple vertebrae without definite change. Right hilum calcifications and one or few noncalcified lung small foci, such as (0.3 cm) (series 4, image 229) compatible with granulomata. Spine and vascular calcification. Dependent gallbladder calcifications compatible with calculi. Marked upper intra-abdominal fat. No evidence of splenomegaly, pleural or pericardial effusion, bulky axilla, mediastinum, or left hilum adenopathy, or lung consolidation. IMPRESSION: No definite change in bone lesions compatible with history of myeloma, though comparison limited. Cholelithiasis.  Granulomata. 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 Bone marrow biopsy apparently showed 50% kappa restricted plasma cells with an initial M spike of 3.4 g/dL with evidence of lytic lesions on his PET/CT scan including right rib fracture and lesions of the lumbar and cervical spine. -Patient was treated under protocol 11-C-0221 with from 02/21/2013 with  Carfilzomib/Revlimid/Dexamethasone for a total of 8 cycles. He reports that his stem cells were collected and stored after 4 cycles -Posttreatment bone marrow biopsy showed less than 5% plasma cells with a negative flow cytometry and improvement in his previously PET avid lesions. Some concern for new focus of activity at C2 lytic lesion at L4 and several small lesions throughout the vertebrae. -Patient was on maintenance lenalidomide 10 mg by mouth daily for 2 years or more until end of 2014. 08/19/2013 - bone marrow biopsy showed normocellular marrow with less than 5% plasma cells. PET CT scan showed decreased focal metabolic ligament prior rib lesions and no new lesions. Flow cytometry was negative for residual disease. Serum and urine electrophoresis and IFE showed no evidence of monoclonal protein. -Patient notes that he was off protocol for a few years due to lack of clinical trial research funding. -Patient notes that he is back on a follow-up protocol and continues to follow and receive his primary treatment at [facility] at this time.  -Patient's recent labs show minimal IgG kappa protein on IFE and a CT chest in May 2018 that did not show any overt signs of myeloma progression in the bones.  Rpt Myeloma labs 03/2017 - M spike of 0.3g/dl with a repeat M spike stable at 0.3 g/dL on 05/23/2017.  Serum kappa  lambda free light chain ratio within normal limits. PET/CT scan showed a single metabolically active sternal lesion without any overt bony destruction. Bone marrow biopsy done on 06/06/2017 - shows no overt involvement with multiple myeloma.  2) Elevated bilirubin level 2.72 up from 1.3. Repeat labs today show normalization of his bilirubin levels after switching to a three-week on one-week off regimen with the Revlimid.  Plan  -Discussed lab results with the patient. M spike persistent and stable at 0.3g/dl with normalization of kappa lambda free light chain ratios. -discussed with patient BM biopsy results which showed no overt evidence of myeloma involvement  -After discussion off multiple treatment approaches we decided to proceed with continued Revlimid maintenance + Zometa  -We will hold off on palliative RT and only consider this if he has symptomatic disease progression in the bones -no prohibitive toxicities from Zometa or Revlimid maintenance at  this time. - continue on Zometa q8weeks to treat his myeloma-related skeletal disease. -[facility] recommendations in 08/2017 with labs, BM and PET/CT -Revlimid switched to 56m 3 weeks on 1 week due to abnormal LFTs, also recommended hold naproxne use and backing off use of lipitor and other hepatotoxic medications bilirubin levels have now normalized.   3) PET/CT abnormality in the Prostate. PSA levels WNL -Primary care physician to consider urology referral  4) Abnormal focus of FDG avid at a spot in the liver - will need to be monitor on f/u scans. No pain or other symptoms at this time.  RTC with Dr KIrene Limboin 6-7 weeks with labs CMeansvillescheduled for 9/4 Reschedule Zometa q8weeks (insted of q4weeks) starting in 6 weeks with next clinic visit  All of the patients questions were answered with apparent satisfaction. The patient knows to call the clinic with any problems, questions or concerns.  I spent 25  minutes counseling the patient face to face. The total time spent in the appointment was 30 minutes and more than 50% was on counseling and direct patient cares.    GSullivan LoneMD MBlairsvilleAAHIVMS SPrince William Ambulatory Surgery CenterCSharon HospitalHematology/Oncology Physician COur [facility] (Office):       [phone](Work cell):  [phone](Fax):           [phone] .06/13/2017

## 2017-06-29 ENCOUNTER — Other Ambulatory Visit: Payer: Self-pay

## 2017-06-29 DIAGNOSIS — C9002 Multiple myeloma in relapse: Secondary | ICD-10-CM

## 2017-06-29 MED ORDER — LENALIDOMIDE 10 MG PO CAPS: 10.0000 mg | ORAL_CAPSULE | Freq: Every day | ORAL | 0 refills | Status: DC

## 2017-07-10 ENCOUNTER — Encounter (HOSPITAL_COMMUNITY): Payer: Self-pay

## 2017-07-14 LAB — CHROMOSOME ANALYSIS, BONE MARROW

## 2017-07-14 LAB — TISSUE HYBRIDIZATION (BONE MARROW)-NCBH

## 2017-07-18 ENCOUNTER — Ambulatory Visit: Payer: Medicare Other

## 2017-07-18 DIAGNOSIS — H401131 Primary open-angle glaucoma, bilateral, mild stage: Secondary | ICD-10-CM | POA: Diagnosis not present

## 2017-07-24 NOTE — Progress Notes (Signed)
[name]    HEMATOLOGY/ONCOLOGY CLINIC NOTE  Date of Service: 07/24/17   Patient Care Team: Michael Lass, MD as PCP - General (Family Medicine) Michael Riley MD ([facility] _ primary oncology) phone number [phone] Email- [email or web address]  CHIEF COMPLAINTS/PURPOSE OF CONSULTATION:   followup of multiple myeloma.  HISTORY OF PRESENTING ILLNESS:   Michael [name] is a wonderful 66 y.o. 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 Patient is currently on a study protocol and is being monitored for myeloma recurrence.  Based on available records being put together  -Patient was diagnosed with IgG kappa ISS stage I multiple myeloma with hyperdiploid complex cytogenetics in 2012. Bone marrow biopsy apparently showed 50% kappa restricted plasma cells with an initial M spike of 3.4 g/dL with evidence of lytic lesions on his PET/CT scan including right rib fracture and lesions of the lumbar and cervical spine. -Patient was treated under protocol 11-C-0221 with from 02/21/2013 with  Carfilzomib/Revlimid/Dexamethasone for a total of 8 cycles. He reports that his stem cells were collected and stored after 4 cycles -Posttreatment bone marrow biopsy showed less than 5% plasma cells with a negative flow cytometry and improvement in his previously PET avid lesions. Some concern for new focus of activity at C2 lytic lesion at L4 and several small lesions throughout the vertebrae. -Patient was on maintenance lenalidomide 10 mg by mouth daily for 2 years or more until end of 2014. 08/19/2013 - bone marrow biopsy showed normocellular marrow with less than 5% plasma cells. PET CT scan showed  decreased focal metabolic ligament prior rib lesions and no new lesions. Flow cytometry was negative for residual disease. Serum and urine electrophoresis and IFE showed no evidence of monoclonal protein. -Patient notes that he was off protocol for a few years due to lack of clinical trial research funding. -Patient notes that he is back on a follow-up protocol and continues to follow and receive his primary treatment at [facility] at this time. -Patient reports he had his last PET CT scan blood and urine tests and bone marrow examination in February 2018. The results of which are not available to [place] currently.  He notes that there was concern for a very slowly growing sternal FDG avid lesion that has been present for years. He has a follow-up at [facility] next month to determine the management of this lesion. He reports that was some consideration of considering radiation. he notes that this lesion has not been biopsied. No other focal bone pains at this time.  We discussed in detail about what our role will be and noted that we shall be available for any acute issues that need to be taken care of locally but that at this time the primary treatment and evaluation will be driven by his team at [facility] as per their research protocols and input. The patient and his wife are clearly aware of this and are in agreement with this plan.  INTERVAL HISTORY  Patient comes is here for continued management of his MM. His hyperbilirubinemia had resolved after switching his Revlimid to 3 weeks on 1 week off. Notes he himself decided to continue daily Revlimid and has tolerated this. His bilirubin level increased a little from 1.2 to 1.49.  He has no other acute new symptoms. We is tolerating his maintenance Revlimid without any overt clinical or lab toxicities and Zometa every 8 weeks.  Wife was present with the patient for this discussion.   MEDICAL HISTORY:  Past Medical History:  Diagnosis Date  . Angioedema     . Gastroesophageal reflux disease without esophagitis   . Hyperlipidemia   . Multiple myeloma ([place]) 2012   Treated at [facility]  . Non morbid obesity due to excess calories   . Prediabetes   . Prediabetes   . Unspecified glaucoma(365.9)   . Urticaria, unspecified   Diabetes mellitus Glaucoma Left toes neuropathy  GERD  SURGICAL HISTORY: Past Surgical History:  Procedure Laterality Date  . PORTACATH PLACEMENT      SOCIAL HISTORY: Social History   Social History  . Marital status: Married    Spouse name: N/A  . Number of children: 2  . Years of education: N/A   Occupational History  . Retired Chief Strategy Officer for [employer]  . Smoking status: Never Smoker  . Smokeless tobacco: Never Used  . Alcohol use 0.0 oz/week     Comment: Occasional beer  . Drug use: No  . Sexual activity: Not on file   Other Topics Concern  . Not on file   Social History Narrative  . No narrative on file  Never smoker Married Retired from the [employer] in June 2017 and moved to [facility].  FAMILY HISTORY: Family History  Problem Relation Age of Onset  . Cancer Father     ALLERGIES:  has No Known Allergies.  MEDICATIONS:  Current Outpatient Prescriptions  Medication Sig Dispense Refill  . aspirin 81 MG tablet Take 81 mg by mouth daily.    [name] atorvastatin (LIPITOR) 20 MG tablet Take 20 mg by mouth daily.    . Calcium Carbonate-Vitamin D (CALCIUM-D) 600-400 MG-UNIT TABS Take by mouth 2 (two) times daily.    [name] lenalidomide (REVLIMID) 10 MG capsule Take 1 capsule (10 mg total) by mouth daily. Auth #[id number] 28 capsule 0  . naproxen (NAPROSYN) 500 MG tablet Take 500 mg by mouth as needed.     . pantoprazole (PROTONIX) 40 MG tablet Take 40 mg by mouth as needed.     . vitamin B-12 (CYANOCOBALAMIN) 1000 MCG tablet Take 1,000 mcg by mouth daily.     No current facility-administered medications for this visit.     REVIEW OF SYSTEMS:    10 Point  review of Systems was done is negative except as noted above.  PHYSICAL EXAMINATION: ECOG PERFORMANCE STATUS: 1 - Symptomatic but completely ambulatory Vital signs reviewed in EPIC GENERAL:alert, in no acute distress and comfortable SKIN: no acute rashes, no significant lesions EYES: conjunctiva are pink and non-injected, sclera anicteric OROPHARYNX: MMM, no exudates, no oropharyngeal erythema or ulceration NECK: supple, no JVD LYMPH:  no palpable lymphadenopathy in the cervical, axillary or inguinal regions LUNGS: clear to auscultation b/l with normal respiratory effort HEART: regular rate & rhythm ABDOMEN:  Obese, normoactive bowel sounds , non tender, not distended. No palpable hepatosplenomegaly Extremity: no pedal edema PSYCH: alert & oriented x 3 with fluent speech NEURO: no focal motor/sensory deficits  LABORATORY DATA:  I have reviewed the data as listed  . CBC Latest Ref Rng & Units 06/13/2017 06/06/2017 05/23/2017  WBC 4.0 - 10.3 10e3/uL 4.5 4.2 4.7  Hemoglobin 13.0 - 17.1 g/dL 14.4 14.4 15.4  Hematocrit 38.4 - 49.9 % 42.9  42.0 44.5  Platelets 140 - 400 10e3/uL 159 193 169    . CMP Latest Ref Rng & Units 06/13/2017 05/23/2017 05/23/2017  Glucose 70 - 140 mg/dl 103 113 -  BUN 7.0 - 26.0 mg/dL 12.7 16.9 -  Creatinine 0.7 - 1.3 mg/dL 0.8 0.8 -  Sodium 136 - 145 mEq/L 140 139 -  Potassium 3.5 - 5.1 mEq/L 3.9 3.7 -  CO2 22 - 29 mEq/L 26 25 -  Calcium 8.4 - 10.4 mg/dL 9.3 9.7 -  Total Protein 6.4 - 8.3 g/dL 6.9 7.5 6.9  Total Bilirubin 0.20 - 1.20 mg/dL 1.20 2.72(H) -  Alkaline Phos 40 - 150 U/L 69 84 -  AST 5 - 34 U/L 18 22 -  ALT 0 - 55 U/L 22 33 -                  RADIOGRAPHIC STUDIES: I have personally reviewed the radiological images as listed and agreed with the findings in the report. No results found.  CT Chest Final Exam: CT Chest Exam Date: 03/02/2017 Accession #: [id number] Ordered By: Michael [name] CLINICAL INDICATION (entered by  referring clinician): follow up TECHNIQUE: Multidetector helical (2 mm, dual 26-OMBTDHR) images following vascular contrast administration (181 cm, 120 kg, 2 ml/s, 120 ml Isovue-300) obtained without apparent complication. 5 mm images also reconstructed from data. Additional history: Multiple myeloma. COMPARISON: Chest CT component of 21 November 2016 PET-CT; no prior comparison diagnostic CT available. FINDINGS: Chest: Scattered bone radiolucencies of various sizes affecting sternum, manubrium, and multiple vertebrae without definite change. Right hilum calcifications and one or few noncalcified lung small foci, such as (0.3 cm) (series 4, image 229) compatible with granulomata. Spine and vascular calcification. Dependent gallbladder calcifications compatible with calculi. Marked upper intra-abdominal fat. No evidence of splenomegaly, pleural or pericardial effusion, bulky axilla, mediastinum, or left hilum adenopathy, or lung consolidation. IMPRESSION: No definite change in bone lesions compatible with history of myeloma, though comparison limited. Cholelithiasis. Granulomata. 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 Bone marrow biopsy apparently showed 50% kappa restricted plasma cells with an initial M spike of 3.4 g/dL with evidence of lytic lesions on his PET/CT scan including right rib fracture and lesions of the lumbar and cervical spine. -Patient was treated under protocol 11-C-0221 with from 02/21/2013 with  Carfilzomib/Revlimid/Dexamethasone for a total of 8 cycles. He reports that his stem cells were collected and stored after 4 cycles -Posttreatment bone marrow biopsy showed less than 5% plasma cells with a negative flow cytometry and  improvement in his previously PET avid lesions. Some concern for new focus of activity at C2 lytic lesion at L4 and several small lesions throughout the vertebrae. -Patient was on maintenance lenalidomide 10 mg by mouth daily for 2 years or more until end of 2014. 08/19/2013 - bone marrow biopsy showed normocellular marrow with less than 5% plasma cells. PET CT scan showed decreased focal metabolic ligament prior rib lesions and no new lesions. Flow cytometry was negative for residual disease. Serum and urine electrophoresis and IFE showed no evidence of monoclonal protein. -Patient notes that he was off protocol for a few years due to lack of clinical trial research funding. -Patient notes that he is back on a follow-up protocol and continues to follow and receive his  primary treatment at [facility] at this time.  -Patient's recent labs show minimal IgG kappa protein on IFE and a CT chest in May 2018 that did not show any overt signs of myeloma progression in the bones.  Rpt Myeloma labs 03/2017 - M spike of 0.3g/dl with a repeat M spike stable at 0.3 g/dL on 05/23/2017 which remains stable on labs today 07/25/17 Serum kappa lambda free light chain ratio not significantly changed. PET/CT scan showed a single metabolically active sternal lesion without any overt bony destruction. Bone marrow biopsy done on 06/06/2017 - shows no overt involvement with multiple myeloma.  2) Elevated bilirubin level . Repeat labs today show normalization of his bilirubin levels after switching to a three-week on one-week off regimen with the Revlimid.  Plan  -Discussed lab results with the patient. M spike persistent and stable at 0.3g/dl with stable serum kappa lambda free light chain ratios. --no prohibitive toxicities from Zometa or Revlimid maintenance at this time. - continue on Zometa q8weeks to treat his myeloma-related skeletal disease. -continue Revlimid 56m 3 weeks on 1 week off   -[facility] as [facility]  their recommendations in 08/2017 with labs, BM and PET/CT   3) PET/CT abnormality in the Prostate. PSA levels WNL -Primary care physician to consider urology referral  4) Abnormal focus of FDG avid at a spot in the liver - will need to be monitor on f/u scans. No pain or other symptoms at this time.  RTC with Dr KIrene Limboin 8 weeks with labs CFloydadascheduled for 9/4 Reschedule Zometa q8weeks (instead of q4weeks) starting in 6 weeks with next clinic visit  All of the patients questions were answered with apparent satisfaction. The patient knows to call the clinic with any problems, questions or concerns.  I spent 20 minutes counseling the patient face to face. The total time spent in the appointment was 25 minutes and more than 50% was on counseling and direct patient cares.    GSullivan LoneMD MNew AthensAAHIVMS SProvident Hospital Of Cook CountyCMesa View Regional HospitalHematology/Oncology Physician CNew York Psychiatric Institute (Office):       [phone](Work cell):  [phone](Fax):           [phone] This document serves as a record of services personally performed by GSullivan Lone MD. It was created on her behalf by LAlean Rinne a trained medical scribe. The creation of this record is based on the scribe's personal observations and the provider's statements to them. This document has been checked and approved by the attending provider.

## 2017-07-25 ENCOUNTER — Ambulatory Visit (HOSPITAL_BASED_OUTPATIENT_CLINIC_OR_DEPARTMENT_OTHER): Payer: Medicare Other | Admitting: Hematology

## 2017-07-25 ENCOUNTER — Other Ambulatory Visit: Payer: Self-pay

## 2017-07-25 ENCOUNTER — Ambulatory Visit: Payer: Medicare Other

## 2017-07-25 ENCOUNTER — Other Ambulatory Visit: Payer: Medicare Other

## 2017-07-25 ENCOUNTER — Encounter: Payer: Self-pay | Admitting: Hematology

## 2017-07-25 ENCOUNTER — Telehealth: Payer: Self-pay | Admitting: Hematology

## 2017-07-25 ENCOUNTER — Ambulatory Visit (HOSPITAL_BASED_OUTPATIENT_CLINIC_OR_DEPARTMENT_OTHER): Payer: Medicare Other

## 2017-07-25 DIAGNOSIS — C9002 Multiple myeloma in relapse: Secondary | ICD-10-CM

## 2017-07-25 DIAGNOSIS — C9000 Multiple myeloma not having achieved remission: Secondary | ICD-10-CM

## 2017-07-25 LAB — CBC & DIFF AND RETIC
BASO%: 2.2 % — ABNORMAL HIGH (ref 0.0–2.0)
BASOS ABS: 0.1 10*3/uL (ref 0.0–0.1)
EOS ABS: 0.1 10*3/uL (ref 0.0–0.5)
EOS%: 3.6 % (ref 0.0–7.0)
HCT: 43.1 % (ref 38.4–49.9)
HEMOGLOBIN: 14.6 g/dL (ref 13.0–17.1)
Immature Retic Fract: 5.5 % (ref 3.00–10.60)
LYMPH%: 36.1 % (ref 14.0–49.0)
MCH: 29.9 pg (ref 27.2–33.4)
MCHC: 33.9 g/dL (ref 32.0–36.0)
MCV: 88.3 fL (ref 79.3–98.0)
MONO#: 0.5 10*3/uL (ref 0.1–0.9)
MONO%: 12.8 % (ref 0.0–14.0)
NEUT%: 45.3 % (ref 39.0–75.0)
NEUTROS ABS: 1.7 10*3/uL (ref 1.5–6.5)
Platelets: 138 10*3/uL — ABNORMAL LOW (ref 140–400)
RBC: 4.88 10*6/uL (ref 4.20–5.82)
RDW: 13.9 % (ref 11.0–14.6)
Retic %: 1.08 % (ref 0.80–1.80)
Retic Ct Abs: 52.7 10*3/uL (ref 34.80–93.90)
WBC: 3.7 10*3/uL — ABNORMAL LOW (ref 4.0–10.3)
lymph#: 1.3 10*3/uL (ref 0.9–3.3)

## 2017-07-25 LAB — COMPREHENSIVE METABOLIC PANEL
ALT: 27 U/L (ref 0–55)
AST: 19 U/L (ref 5–34)
Albumin: 4.1 g/dL (ref 3.5–5.0)
Alkaline Phosphatase: 72 U/L (ref 40–150)
Anion Gap: 8 mEq/L (ref 3–11)
BILIRUBIN TOTAL: 1.49 mg/dL — AB (ref 0.20–1.20)
BUN: 10.4 mg/dL (ref 7.0–26.0)
CHLORIDE: 107 meq/L (ref 98–109)
CO2: 25 meq/L (ref 22–29)
Calcium: 9.3 mg/dL (ref 8.4–10.4)
Creatinine: 0.8 mg/dL (ref 0.7–1.3)
GLUCOSE: 105 mg/dL (ref 70–140)
Potassium: 3.6 mEq/L (ref 3.5–5.1)
SODIUM: 141 meq/L (ref 136–145)
TOTAL PROTEIN: 7 g/dL (ref 6.4–8.3)

## 2017-07-25 MED ORDER — SODIUM CHLORIDE 0.9 % IV SOLN
Freq: Once | INTRAVENOUS | Status: AC
  Administered 2017-07-25: 15:00:00 via INTRAVENOUS

## 2017-07-25 MED ORDER — ZOLEDRONIC ACID 4 MG/100ML IV SOLN
4.0000 mg | Freq: Once | INTRAVENOUS | Status: AC
  Administered 2017-07-25: 4 mg via INTRAVENOUS
  Filled 2017-07-25: qty 100

## 2017-07-25 MED ORDER — LENALIDOMIDE 10 MG PO CAPS: 10.0000 mg | ORAL_CAPSULE | Freq: Every day | ORAL | 0 refills | Status: DC

## 2017-07-25 NOTE — Patient Instructions (Signed)

## 2017-07-25 NOTE — Telephone Encounter (Signed)
07/22/17 prescription refill.  Prescription scanned in media

## 2017-07-26 LAB — KAPPA/LAMBDA LIGHT CHAINS
IG KAPPA FREE LIGHT CHAIN: 20.2 mg/L — AB (ref 3.3–19.4)
Ig Lambda Free Light Chain: 11.5 mg/L (ref 5.7–26.3)
Kappa/Lambda FluidC Ratio: 1.76 — ABNORMAL HIGH (ref 0.26–1.65)

## 2017-07-27 LAB — MULTIPLE MYELOMA PANEL, SERUM
ALBUMIN/GLOB SERPL: 1.2 (ref 0.7–1.7)
ALPHA 1: 0.2 g/dL (ref 0.0–0.4)
ALPHA2 GLOB SERPL ELPH-MCNC: 0.7 g/dL (ref 0.4–1.0)
Albumin SerPl Elph-Mcnc: 3.6 g/dL (ref 2.9–4.4)
B-GLOBULIN SERPL ELPH-MCNC: 1.1 g/dL (ref 0.7–1.3)
Gamma Glob SerPl Elph-Mcnc: 1.2 g/dL (ref 0.4–1.8)
Globulin, Total: 3.1 g/dL (ref 2.2–3.9)
IGG (IMMUNOGLOBIN G), SERUM: 934 mg/dL (ref 700–1600)
IGM (IMMUNOGLOBIN M), SRM: 37 mg/dL (ref 20–172)
IgA, Qn, Serum: 123 mg/dL (ref 61–437)
M PROTEIN SERPL ELPH-MCNC: 0.3 g/dL — AB
Total Protein: 6.7 g/dL (ref 6.0–8.5)

## 2017-09-05 ENCOUNTER — Other Ambulatory Visit: Payer: Self-pay

## 2017-09-11 DIAGNOSIS — Z23 Encounter for immunization: Secondary | ICD-10-CM | POA: Diagnosis not present

## 2017-09-11 DIAGNOSIS — G4733 Obstructive sleep apnea (adult) (pediatric): Secondary | ICD-10-CM | POA: Diagnosis not present

## 2017-09-12 ENCOUNTER — Ambulatory Visit: Payer: Self-pay

## 2017-09-12 ENCOUNTER — Ambulatory Visit: Payer: Medicare Other | Admitting: Hematology

## 2017-09-19 DIAGNOSIS — E7800 Pure hypercholesterolemia, unspecified: Secondary | ICD-10-CM | POA: Diagnosis not present

## 2017-09-19 DIAGNOSIS — C9000 Multiple myeloma not having achieved remission: Secondary | ICD-10-CM | POA: Diagnosis not present

## 2017-09-19 DIAGNOSIS — K219 Gastro-esophageal reflux disease without esophagitis: Secondary | ICD-10-CM | POA: Diagnosis not present

## 2017-09-19 DIAGNOSIS — G4733 Obstructive sleep apnea (adult) (pediatric): Secondary | ICD-10-CM | POA: Diagnosis not present

## 2017-09-19 DIAGNOSIS — E119 Type 2 diabetes mellitus without complications: Secondary | ICD-10-CM | POA: Diagnosis not present

## 2017-09-19 DIAGNOSIS — E6609 Other obesity due to excess calories: Secondary | ICD-10-CM | POA: Diagnosis not present

## 2017-09-20 ENCOUNTER — Telehealth: Payer: Self-pay

## 2017-09-20 DIAGNOSIS — G4733 Obstructive sleep apnea (adult) (pediatric): Secondary | ICD-10-CM | POA: Diagnosis not present

## 2017-09-20 NOTE — Telephone Encounter (Signed)
Patient had recent visit on 08/31/17 with physician at the [facility]. Recommendation was to increase revlimid dose to 15mg . Dr. Irene Limbo in agreement with plan. Will hold revlimid reorder at this time until patient is seen on 12/18. At that time labs will be reviewed and patient reassessed before restarting revlimid. Pt verbalized understanding and agreement to plan.

## 2017-10-03 ENCOUNTER — Telehealth: Payer: Self-pay | Admitting: Hematology

## 2017-10-03 ENCOUNTER — Telehealth: Payer: Self-pay

## 2017-10-03 ENCOUNTER — Ambulatory Visit (HOSPITAL_BASED_OUTPATIENT_CLINIC_OR_DEPARTMENT_OTHER): Payer: Medicare Other

## 2017-10-03 ENCOUNTER — Encounter: Payer: Self-pay | Admitting: Hematology

## 2017-10-03 ENCOUNTER — Other Ambulatory Visit (HOSPITAL_BASED_OUTPATIENT_CLINIC_OR_DEPARTMENT_OTHER): Payer: Medicare Other

## 2017-10-03 ENCOUNTER — Ambulatory Visit (HOSPITAL_BASED_OUTPATIENT_CLINIC_OR_DEPARTMENT_OTHER): Payer: Medicare Other | Admitting: Hematology

## 2017-10-03 VITALS — BP 141/77 | HR 73 | Temp 98.3°F | Resp 14 | Ht 73.0 in | Wt 252.0 lb

## 2017-10-03 DIAGNOSIS — C9002 Multiple myeloma in relapse: Secondary | ICD-10-CM

## 2017-10-03 DIAGNOSIS — C9000 Multiple myeloma not having achieved remission: Secondary | ICD-10-CM

## 2017-10-03 LAB — CBC & DIFF AND RETIC
BASO%: 0.6 % (ref 0.0–2.0)
BASOS ABS: 0 10*3/uL (ref 0.0–0.1)
EOS%: 1.8 % (ref 0.0–7.0)
Eosinophils Absolute: 0.1 10*3/uL (ref 0.0–0.5)
HEMATOCRIT: 43.2 % (ref 38.4–49.9)
HEMOGLOBIN: 14.8 g/dL (ref 13.0–17.1)
IMMATURE RETIC FRACT: 2.5 % — AB (ref 3.00–10.60)
LYMPH#: 1.2 10*3/uL (ref 0.9–3.3)
LYMPH%: 37.8 % (ref 14.0–49.0)
MCH: 30.6 pg (ref 27.2–33.4)
MCHC: 34.3 g/dL (ref 32.0–36.0)
MCV: 89.3 fL (ref 79.3–98.0)
MONO#: 0.3 10*3/uL (ref 0.1–0.9)
MONO%: 10.4 % (ref 0.0–14.0)
NEUT#: 1.6 10*3/uL (ref 1.5–6.5)
NEUT%: 49.4 % (ref 39.0–75.0)
Platelets: 166 10*3/uL (ref 140–400)
RBC: 4.84 10*6/uL (ref 4.20–5.82)
RDW: 14.8 % — ABNORMAL HIGH (ref 11.0–14.6)
RETIC %: 1.18 % (ref 0.80–1.80)
RETIC CT ABS: 57.11 10*3/uL (ref 34.80–93.90)
WBC: 3.3 10*3/uL — AB (ref 4.0–10.3)
nRBC: 0 % (ref 0–0)

## 2017-10-03 LAB — COMPREHENSIVE METABOLIC PANEL
ALBUMIN: 4.1 g/dL (ref 3.5–5.0)
ALK PHOS: 79 U/L (ref 40–150)
ALT: 30 U/L (ref 0–55)
AST: 22 U/L (ref 5–34)
Anion Gap: 8 mEq/L (ref 3–11)
BUN: 13.7 mg/dL (ref 7.0–26.0)
CALCIUM: 9.2 mg/dL (ref 8.4–10.4)
CO2: 27 mEq/L (ref 22–29)
CREATININE: 0.8 mg/dL (ref 0.7–1.3)
Chloride: 105 mEq/L (ref 98–109)
EGFR: >60 mL/min/{1.73_m2} (ref >60)
GLUCOSE: 103 mg/dL (ref 70–140)
Potassium: 3.8 mEq/L (ref 3.5–5.1)
SODIUM: 140 meq/L (ref 136–145)
TOTAL PROTEIN: 7.1 g/dL (ref 6.4–8.3)
Total Bilirubin: 1.54 mg/dL — ABNORMAL HIGH (ref 0.20–1.20)

## 2017-10-03 MED ORDER — SODIUM CHLORIDE 0.9 % IV SOLN
Freq: Once | INTRAVENOUS | Status: AC
  Administered 2017-10-03: 15:00:00 via INTRAVENOUS

## 2017-10-03 MED ORDER — LENALIDOMIDE 15 MG PO CAPS: 15.0000 mg | ORAL_CAPSULE | Freq: Every day | ORAL | 0 refills | Status: DC

## 2017-10-03 MED ORDER — ZOLEDRONIC ACID 4 MG/100ML IV SOLN
4.0000 mg | Freq: Once | INTRAVENOUS | Status: AC
  Administered 2017-10-03: 4 mg via INTRAVENOUS
  Filled 2017-10-03: qty 100

## 2017-10-03 NOTE — Telephone Encounter (Signed)
Pt revlimid dose and frequency changed per Dr. Irene Limbo. Pt to take revlimid 15mg  daily for days 1-21 of every 28 days. Per [employer] representative, process does not have to be restarted. Additionally spoke with Raquel Sarna, [facility] in oral chemo and dose change does not require a printed copy of the medication. Revlimid sent to [facility] for processing. Authorization number acquired and attached to order.

## 2017-10-03 NOTE — Patient Instructions (Signed)

## 2017-10-03 NOTE — Telephone Encounter (Signed)
Due to pt, revlimid held until re-evaluation of labs by Dr. Irene Limbo today. Will refill based on provider preference after visit with patient and assessment of response/symptoms.

## 2017-10-03 NOTE — Progress Notes (Signed)
[name]    HEMATOLOGY/ONCOLOGY CLINIC NOTE  Date of Service: 10/03/17   Patient Care Team: Michael Lass, [name] as PCP - General (Family Medicine) Michael Riley [name] ([facility] _ primary oncology) phone number [phone] Email- [email or web address]  CHIEF COMPLAINTS/PURPOSE OF CONSULTATION:   followup of multiple myeloma.  HISTORY OF PRESENTING ILLNESS:   Michael [name] is a wonderful 66 y.o. male , retired [employer] who has been referred to [place] by Dr .Michael [name], Michael Haw, [name] for establishing local oncology care "In case needed for treatment/management of complications pertaining to myeloma"  Patient has a history of multiple myeloma and is currently being followed actively managed at [facility] by Michael [name] who is his primary oncologist. Patient is currently on a study protocol and is being monitored for myeloma recurrence.  Based on available records being put together  -Patient was diagnosed with IgG kappa ISS stage I multiple myeloma with hyperdiploid complex cytogenetics in 2012. Bone marrow biopsy apparently showed 50% kappa restricted plasma cells with an initial M spike of 3.4 g/dL with evidence of lytic lesions on his PET/CT scan including right rib fracture and lesions of the lumbar and cervical spine. -Patient was treated under protocol 11-C-0221 with from 02/21/2013 with  Carfilzomib/Revlimid/Dexamethasone for a total of 8 cycles. He reports that his stem cells were collected and stored after 4 cycles -Posttreatment bone marrow biopsy showed less than 5% plasma cells with a negative flow cytometry and improvement in his previously PET avid lesions. Some concern for new focus of activity at C2 lytic lesion at L4 and several small lesions throughout the vertebrae. -Patient was on maintenance lenalidomide 10 mg by mouth daily for 2 years or more until end of 2014. 08/19/2013 - bone marrow biopsy showed normocellular marrow with less than 5% plasma cells. PET CT scan showed  decreased focal metabolic ligament prior rib lesions and no new lesions. Flow cytometry was negative for residual disease. Serum and urine electrophoresis and IFE showed no evidence of monoclonal protein. -Patient notes that he was off protocol for a few years due to lack of clinical trial research funding. -Patient notes that he is back on a follow-up protocol and continues to follow and receive his primary treatment at [facility] at this time. -Patient reports he had his last PET CT scan blood and urine tests and bone marrow examination in February 2018. The results of which are not available to [place] currently.  He notes that there was concern for a very slowly growing sternal FDG avid lesion that has been present for years. He has a follow-up at [facility] next month to determine the management of this lesion. He reports that was some consideration of considering radiation. he notes that this lesion has not been biopsied. No other focal bone pains at this time.  We discussed in detail about what our role will be and noted that we shall be available for any acute issues that need to be taken care of locally but that at this time the primary treatment and evaluation will be driven by his team at [facility] as per their research protocols and input. The patient and his wife are clearly aware of this and are in agreement with this plan.  INTERVAL HISTORY  Patient comes is here for continued management of his MM. He is generally doing well overall. He had a bone marrow biopsy and PET scan with [facility]. He was recommended to increase dose of maintenance Revlimid to 75m if tolerated. No other acute new concerns.  On  ROS, pt reports weakness to the legs and denies swelling, fever, chills, night sweats, mouth sores, back pain, abdominal pain and any other accompanying symptoms.   MEDICAL HISTORY:  Past Medical History:  Diagnosis Date  . Angioedema   . Gastroesophageal reflux disease without esophagitis   .  Hyperlipidemia   . Multiple myeloma ([place]) 2012   Treated at [facility]  . Non morbid obesity due to excess calories   . Prediabetes   . Prediabetes   . Unspecified glaucoma(365.9)   . Urticaria, unspecified   Diabetes mellitus Glaucoma Left toes neuropathy  GERD  SURGICAL HISTORY: Past Surgical History:  Procedure Laterality Date  . PORTACATH PLACEMENT      SOCIAL HISTORY: Social History   Socioeconomic History  . Marital status: Married    Spouse name: Not on file  . Number of children: 2  . Years of education: Not on file  . Highest education level: Not on file  Social Needs  . Financial resource strain: Not on file  . Food insecurity - worry: Not on file  . Food insecurity - inability: Not on file  . Transportation needs - medical: Not on file  . Transportation needs - non-medical: Not on file  Occupational History  . Occupation: Retired Chief Strategy Officer for [employer]  . Smoking status: Never Smoker  . Smokeless tobacco: Never Used  Substance and Sexual Activity  . Alcohol use: Yes    Alcohol/week: 0.0 oz    Comment: Occasional beer  . Drug use: No  . Sexual activity: Not on file  Other Topics Concern  . Not on file  Social History Narrative  . Not on file  Never smoker Married Retired from the [employer] in June 2017 and moved to [facility].  FAMILY HISTORY: Family History  Problem Relation Age of Onset  . Cancer Father     ALLERGIES:  has No Known Allergies.  MEDICATIONS:  Current Outpatient Medications  Medication Sig Dispense Refill  . aspirin 81 MG tablet Take 81 mg by mouth daily.    [name] atorvastatin (LIPITOR) 20 MG tablet Take 20 mg by mouth daily.    . Calcium Carbonate-Vitamin D (CALCIUM-D) 600-400 MG-UNIT TABS Take by mouth 2 (two) times daily.    . naproxen (NAPROSYN) 500 MG tablet Take 500 mg by mouth as needed.     . pantoprazole (PROTONIX) 40 MG tablet Take 40 mg by mouth as needed.     . vitamin B-12  (CYANOCOBALAMIN) 1000 MCG tablet Take 1,000 mcg by mouth daily.     No current facility-administered medications for this visit.     REVIEW OF SYSTEMS:    10 Point review of Systems was done is negative except as noted above.  PHYSICAL EXAMINATION: ECOG PERFORMANCE STATUS: 1 - Symptomatic but completely ambulatory Vital signs reviewed in EPIC GENERAL:alert, in no acute distress and comfortable SKIN: no acute rashes, no significant lesions EYES: conjunctiva are pink and non-injected, sclera anicteric OROPHARYNX: MMM, no exudates, no oropharyngeal erythema or ulceration NECK: supple, no JVD LYMPH:  no palpable lymphadenopathy in the cervical, axillary or inguinal regions LUNGS: clear to auscultation b/l with normal respiratory effort HEART: regular rate & rhythm ABDOMEN:  Obese, normoactive bowel sounds , non tender, not distended. No palpable hepatosplenomegaly Extremity: no pedal edema PSYCH: alert & oriented x 3 with fluent speech NEURO: no focal motor/sensory deficits  LABORATORY DATA:  I have reviewed the data as listed  . CBC Latest Ref Rng &  Units 10/03/2017 07/25/2017 06/13/2017  WBC 4.0 - 10.3 10e3/uL 3.3(L) 3.7(L) 4.5  Hemoglobin 13.0 - 17.1 g/dL 14.8 14.6 14.4  Hematocrit 38.4 - 49.9 % 43.2 43.1 42.9  Platelets 140 - 400 10e3/uL 166 138(L) 159    . CMP Latest Ref Rng & Units 10/03/2017 10/03/2017 07/25/2017  Glucose 70 - 140 mg/dl 103 - 105  BUN 7.0 - 26.0 mg/dL 13.7 - 10.4  Creatinine 0.7 - 1.3 mg/dL 0.8 - 0.8  Sodium 136 - 145 mEq/L 140 - 141  Potassium 3.5 - 5.1 mEq/L 3.8 - 3.6  CO2 22 - 29 mEq/L 27 - 25  Calcium 8.4 - 10.4 mg/dL 9.2 - 9.3  Total Protein 6.4 - 8.3 g/dL 7.1 7.0 7.0  Total Bilirubin 0.20 - 1.20 mg/dL 1.54(H) - 1.49(H)  Alkaline Phos 40 - 150 U/L 79 - 72  AST 5 - 34 U/L 22 - 19  ALT 0 - 55 U/L 30 - 27                  RADIOGRAPHIC STUDIES: I have personally reviewed the radiological images as listed and agreed with the findings  in the report. No results found.  CT Chest Final Exam: CT Chest Exam Date: 03/02/2017 Accession #: [id number] Ordered By: Michael [name] CLINICAL INDICATION (entered by referring clinician): follow up TECHNIQUE: Multidetector helical (2 mm, dual 71-IRCVELF) images following vascular contrast administration (181 cm, 120 kg, 2 ml/s, 120 ml Isovue-300) obtained without apparent complication. 5 mm images also reconstructed from data. Additional history: Multiple myeloma. COMPARISON: Chest CT component of 21 November 2016 PET-CT; no prior comparison diagnostic CT available. FINDINGS: Chest: Scattered bone radiolucencies of various sizes affecting sternum, manubrium, and multiple vertebrae without definite change. Right hilum calcifications and one or few noncalcified lung small foci, such as (0.3 cm) (series 4, image 229) compatible with granulomata. Spine and vascular calcification. Dependent gallbladder calcifications compatible with calculi. Marked upper intra-abdominal fat. No evidence of splenomegaly, pleural or pericardial effusion, bulky axilla, mediastinum, or left hilum adenopathy, or lung consolidation. IMPRESSION: No definite change in bone lesions compatible with history of myeloma, though comparison limited. Cholelithiasis. Granulomata. 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 Bone marrow biopsy apparently showed 50% kappa restricted plasma cells with an initial M spike of 3.4 g/dL with evidence of lytic lesions on his PET/CT scan including right rib fracture and lesions of the lumbar and cervical spine. -Patient was treated under protocol 11-C-0221 with from 02/21/2013 with  Carfilzomib/Revlimid/Dexamethasone for a total of 8  cycles. He reports that his stem cells were collected and stored after 4 cycles -Posttreatment bone marrow biopsy showed less than 5% plasma cells with a negative flow cytometry and improvement in his previously PET avid lesions. Some concern for new focus of activity at C2 lytic lesion at L4 and several small lesions throughout the vertebrae. -Patient was on maintenance lenalidomide 10 mg by mouth daily for 2 years or more until end of 2014. 08/19/2013 - bone marrow biopsy showed normocellular marrow with less than 5% plasma cells. PET CT scan showed decreased focal metabolic ligament prior rib lesions and no new lesions. Flow cytometry was negative for residual disease. Serum and urine electrophoresis and IFE showed no evidence of monoclonal protein. -Patient notes that he was off protocol  for a few years due to lack of clinical trial research funding. -Patient notes that he is back on a follow-up protocol and continues to follow and receive his primary treatment at [facility] at this time.  -Patient's recent labs show minimal IgG kappa protein on IFE and a CT chest in May 2018 that did not show any overt signs of myeloma progression in the bones.  Rpt Myeloma labs 03/2017 - M spike of 0.3g/dl with a repeat M spike stable at 0.3 g/dL on 05/23/2017 which remains stable on labs today 07/25/17 Serum kappa lambda free light chain ratio not significantly changed. PET/CT scan showed a single metabolically active sternal lesion without any overt bony destruction. Bone marrow biopsy done on 06/06/2017 - shows no overt involvement with multiple myeloma.  2) Elevated bilirubin level . Repeat labs today show normalization of his bilirubin levels after switching to a three-week on one-week off regimen with the Revlimid.  Plan  -Discussed lab results with the patient. M spike persistent and  Minimally increased at 0.4g/dl with stable serum kappa lambda free light chain ratios. --no prohibitive toxicities from Zometa  or Revlimid maintenance at this time. -will increase Revlimid dose to 36m po daily --3weeks on 1 week off. - continue on Zometa q8weeks to treat his myeloma-related skeletal disease. -[facility] as per their recommendations in 08/2017 with labs, BM and PET/CT   3) PET/CT abnormality in the Prostate. PSA levels WNL -Primary care physician to consider urology referral  4) Abnormal focus of FDG avid at a spot in the liver - will need to be monitor on f/u scans. No pain or other symptoms at this time.  Switch to Revlimid 165m3 weeks on 1 week off Labs in 4 weeks after first course  Reschedule Zometa q8weeks (instead of q4weeks) starting in 6 weeks with next clinic visit   RTC with Dr KaIrene Limbon 2 months (labs 1 week prior to clinic visit)  All of the patients questions were answered with apparent satisfaction. The patient knows to call the clinic with any problems, questions or concerns.  I spent 20 minutes counseling the patient face to face. The total time spent in the appointment was 25 minutes and more than 50% was on counseling and direct patient cares.    GaSullivan LoneD MSHudsonAHIVMS SCMenlo Park Surgery Center LLCTHarris [facility] H[facility]ematology/Oncology Physician CoAtrium Medical Center(Office):       [phone] cell):  [phone]):           [phone] document serves as a record of services personally performed by GaSullivan LoneMD. It was created on her behalf by LaAlean Rinnea trained medical scribe. The creation of this record is based on the scribe's personal observations and the provider's statements to them. This document has been checked and approved by the attending provider.  .I have reviewed the above documentation for accuracy and completeness, and I agree with the above. .GBrunetta GeneraD MS

## 2017-10-03 NOTE — Telephone Encounter (Signed)
Gave avs and calendar for January and february °

## 2017-10-04 LAB — KAPPA/LAMBDA LIGHT CHAINS
IG LAMBDA FREE LIGHT CHAIN: 8.3 mg/L (ref 5.7–26.3)
Ig Kappa Free Light Chain: 15.9 mg/L (ref 3.3–19.4)
KAPPA/LAMBDA FLC RATIO: 1.92 — AB (ref 0.26–1.65)

## 2017-10-06 LAB — MULTIPLE MYELOMA PANEL, SERUM
ALBUMIN/GLOB SERPL: 1.5 (ref 0.7–1.7)
ALPHA 1: 0.2 g/dL (ref 0.0–0.4)
ALPHA2 GLOB SERPL ELPH-MCNC: 0.6 g/dL (ref 0.4–1.0)
Albumin SerPl Elph-Mcnc: 4.1 g/dL (ref 2.9–4.4)
B-GLOBULIN SERPL ELPH-MCNC: 1 g/dL (ref 0.7–1.3)
GAMMA GLOB SERPL ELPH-MCNC: 1.1 g/dL (ref 0.4–1.8)
GLOBULIN, TOTAL: 2.9 g/dL (ref 2.2–3.9)
IGA/IMMUNOGLOBULIN A, SERUM: 114 mg/dL (ref 61–437)
IgM, Qn, Serum: 26 mg/dL (ref 20–172)
M PROTEIN SERPL ELPH-MCNC: 0.4 g/dL — AB
Total Protein: 7 g/dL (ref 6.0–8.5)

## 2017-10-18 ENCOUNTER — Telehealth: Payer: Self-pay

## 2017-10-18 NOTE — Telephone Encounter (Signed)
Dr. Irene Limbo requested for patient to be told that M spike remains about the same and is stable. No response. Left VM with pt communicating this and to call back with any further questions [phone].

## 2017-10-31 ENCOUNTER — Inpatient Hospital Stay: Payer: Medicare Other | Attending: Hematology

## 2017-10-31 DIAGNOSIS — C9002 Multiple myeloma in relapse: Secondary | ICD-10-CM

## 2017-10-31 DIAGNOSIS — C9000 Multiple myeloma not having achieved remission: Secondary | ICD-10-CM | POA: Insufficient documentation

## 2017-10-31 LAB — CBC WITH DIFFERENTIAL (CANCER CENTER ONLY)
Basophils Absolute: 0 10*3/uL (ref 0.0–0.1)
Basophils Relative: 1 %
EOS PCT: 2 %
Eosinophils Absolute: 0.1 10*3/uL (ref 0.0–0.5)
HCT: 41 % (ref 38.4–49.9)
HEMOGLOBIN: 14.1 g/dL (ref 13.0–17.1)
LYMPHS ABS: 1.3 10*3/uL (ref 0.9–3.3)
LYMPHS PCT: 31 %
MCH: 31.3 pg (ref 27.2–33.4)
MCHC: 34.4 g/dL (ref 32.0–36.0)
MCV: 90.9 fL (ref 79.3–98.0)
MONOS PCT: 15 %
Monocytes Absolute: 0.6 10*3/uL (ref 0.1–0.9)
NEUTROS PCT: 51 %
Neutro Abs: 2.1 10*3/uL (ref 1.5–6.5)
Platelet Count: 143 10*3/uL (ref 140–400)
RBC: 4.51 MIL/uL (ref 4.20–5.82)
RDW: 14.9 % (ref 11.0–15.6)
WBC Count: 4.2 10*3/uL (ref 4.0–10.3)

## 2017-10-31 LAB — COMPREHENSIVE METABOLIC PANEL
ALT: 41 U/L (ref 0–55)
AST: 24 U/L (ref 5–34)
Albumin: 4 g/dL (ref 3.5–5.0)
Alkaline Phosphatase: 72 U/L (ref 40–150)
Anion gap: 8 (ref 3–11)
BUN: 10 mg/dL (ref 7–26)
CHLORIDE: 105 mmol/L (ref 98–109)
CO2: 26 mmol/L (ref 22–29)
Calcium: 8.9 mg/dL (ref 8.4–10.4)
Creatinine, Ser: 0.86 mg/dL (ref 0.70–1.30)
GFR calc Af Amer: >60 mL/min (ref >60)
GFR calc non Af Amer: >60 mL/min (ref >60)
Glucose, Bld: 116 mg/dL (ref 70–140)
POTASSIUM: 3.8 mmol/L (ref 3.5–5.1)
SODIUM: 139 mmol/L (ref 136–145)
Total Bilirubin: 1.1 mg/dL (ref 0.2–1.2)
Total Protein: 6.9 g/dL (ref 6.4–8.3)

## 2017-10-31 LAB — RETICULOCYTES
RBC.: 4.51 MIL/uL (ref 4.20–5.82)
Retic Count, Absolute: 76.7 10*3/uL (ref 34.8–93.9)
Retic Ct Pct: 1.7 % (ref 0.8–1.8)

## 2017-11-03 ENCOUNTER — Other Ambulatory Visit: Payer: Self-pay

## 2017-11-03 MED ORDER — LENALIDOMIDE 15 MG PO CAPS: 15.0000 mg | ORAL_CAPSULE | Freq: Every day | ORAL | 0 refills | Status: DC

## 2017-11-24 ENCOUNTER — Other Ambulatory Visit: Payer: Medicare Other

## 2017-11-24 ENCOUNTER — Inpatient Hospital Stay: Payer: Medicare Other

## 2017-11-24 ENCOUNTER — Inpatient Hospital Stay: Payer: Medicare Other | Attending: Hematology

## 2017-11-24 VITALS — BP 132/68 | HR 57 | Temp 98.6°F | Resp 17

## 2017-11-24 DIAGNOSIS — E119 Type 2 diabetes mellitus without complications: Secondary | ICD-10-CM | POA: Insufficient documentation

## 2017-11-24 DIAGNOSIS — Z7982 Long term (current) use of aspirin: Secondary | ICD-10-CM | POA: Insufficient documentation

## 2017-11-24 DIAGNOSIS — K219 Gastro-esophageal reflux disease without esophagitis: Secondary | ICD-10-CM | POA: Diagnosis not present

## 2017-11-24 DIAGNOSIS — E785 Hyperlipidemia, unspecified: Secondary | ICD-10-CM | POA: Diagnosis not present

## 2017-11-24 DIAGNOSIS — C9002 Multiple myeloma in relapse: Secondary | ICD-10-CM | POA: Diagnosis not present

## 2017-11-24 DIAGNOSIS — C7951 Secondary malignant neoplasm of bone: Secondary | ICD-10-CM | POA: Insufficient documentation

## 2017-11-24 DIAGNOSIS — Z79899 Other long term (current) drug therapy: Secondary | ICD-10-CM | POA: Insufficient documentation

## 2017-11-24 DIAGNOSIS — H409 Unspecified glaucoma: Secondary | ICD-10-CM | POA: Diagnosis not present

## 2017-11-24 LAB — CBC WITH DIFFERENTIAL (CANCER CENTER ONLY)
BASOS PCT: 1 %
Basophils Absolute: 0 10*3/uL (ref 0.0–0.1)
Eosinophils Absolute: 0.2 10*3/uL (ref 0.0–0.5)
Eosinophils Relative: 5 %
HEMATOCRIT: 42.1 % (ref 38.4–49.9)
HEMOGLOBIN: 14.6 g/dL (ref 13.0–17.1)
Lymphocytes Relative: 33 %
Lymphs Abs: 1.1 10*3/uL (ref 0.9–3.3)
MCH: 31.3 pg (ref 27.2–33.4)
MCHC: 34.7 g/dL (ref 32.0–36.0)
MCV: 90.3 fL (ref 79.3–98.0)
MONOS PCT: 10 %
Monocytes Absolute: 0.3 10*3/uL (ref 0.1–0.9)
NEUTROS ABS: 1.7 10*3/uL (ref 1.5–6.5)
NEUTROS PCT: 51 %
Platelet Count: 162 10*3/uL (ref 140–400)
RBC: 4.66 MIL/uL (ref 4.20–5.82)
RDW: 14.2 % (ref 11.0–14.6)
WBC Count: 3.4 10*3/uL — ABNORMAL LOW (ref 4.0–10.3)

## 2017-11-24 LAB — COMPREHENSIVE METABOLIC PANEL
ALBUMIN: 4 g/dL (ref 3.5–5.0)
ALT: 25 U/L (ref 0–55)
AST: 19 U/L (ref 5–34)
Alkaline Phosphatase: 70 U/L (ref 40–150)
Anion gap: 8 (ref 3–11)
BUN: 11 mg/dL (ref 7–26)
CO2: 24 mmol/L (ref 22–29)
CREATININE: 0.77 mg/dL (ref 0.70–1.30)
Calcium: 8.8 mg/dL (ref 8.4–10.4)
Chloride: 107 mmol/L (ref 98–109)
GFR calc non Af Amer: >60 mL/min (ref >60)
GLUCOSE: 110 mg/dL (ref 70–140)
Potassium: 3.7 mmol/L (ref 3.5–5.1)
SODIUM: 139 mmol/L (ref 136–145)
Total Bilirubin: 0.9 mg/dL (ref 0.2–1.2)
Total Protein: 6.8 g/dL (ref 6.4–8.3)

## 2017-11-24 LAB — RETICULOCYTES
RBC.: 4.66 MIL/uL (ref 4.20–5.82)
RETIC COUNT ABSOLUTE: 69.9 10*3/uL (ref 34.8–93.9)
Retic Ct Pct: 1.5 % (ref 0.8–1.8)

## 2017-11-24 MED ORDER — ZOLEDRONIC ACID 4 MG/100ML IV SOLN
4.0000 mg | Freq: Once | INTRAVENOUS | Status: AC
  Administered 2017-11-24: 4 mg via INTRAVENOUS
  Filled 2017-11-24: qty 100

## 2017-11-24 NOTE — Patient Instructions (Signed)
[facility] Discharge Instructions for Patients Receiving Chemotherapy  Today you received the following chemotherapy agents Zometa  To help prevent nausea and vomiting after your treatment, we encourage you to take your nausea medication as directed  If you develop nausea and vomiting that is not controlled by your nausea medication, call the clinic.   BELOW ARE SYMPTOMS THAT SHOULD BE REPORTED IMMEDIATELY:  *FEVER GREATER THAN 100.5 F  *CHILLS WITH OR WITHOUT FEVER  NAUSEA AND VOMITING THAT IS NOT CONTROLLED WITH YOUR NAUSEA MEDICATION  *UNUSUAL SHORTNESS OF BREATH  *UNUSUAL BRUISING OR BLEEDING  TENDERNESS IN MOUTH AND THROAT WITH OR WITHOUT PRESENCE OF ULCERS  *URINARY PROBLEMS  *BOWEL PROBLEMS  UNUSUAL RASH Items with * indicate a potential emergency and should be followed up as soon as possible.  Feel free to call the clinic should you have any questions or concerns. The clinic phone number is [phone].  Please show the CHEMO ALERT CARD at check-in to the Emergency Department and triage nurse.   

## 2017-11-27 LAB — KAPPA/LAMBDA LIGHT CHAINS
KAPPA FREE LGHT CHN: 19.4 mg/L (ref 3.3–19.4)
KAPPA, LAMDA LIGHT CHAIN RATIO: 2 — AB (ref 0.26–1.65)
LAMDA FREE LIGHT CHAINS: 9.7 mg/L (ref 5.7–26.3)

## 2017-11-29 LAB — MULTIPLE MYELOMA PANEL, SERUM
ALPHA 1: 0.2 g/dL (ref 0.0–0.4)
Albumin SerPl Elph-Mcnc: 4 g/dL (ref 2.9–4.4)
Albumin/Glob SerPl: 1.5 (ref 0.7–1.7)
Alpha2 Glob SerPl Elph-Mcnc: 0.6 g/dL (ref 0.4–1.0)
B-Globulin SerPl Elph-Mcnc: 1 g/dL (ref 0.7–1.3)
GAMMA GLOB SERPL ELPH-MCNC: 0.9 g/dL (ref 0.4–1.8)
GLOBULIN, TOTAL: 2.8 g/dL (ref 2.2–3.9)
IGA: 121 mg/dL (ref 61–437)
IgG (Immunoglobin G), Serum: 1068 mg/dL (ref 700–1600)
IgM (Immunoglobulin M), Srm: 30 mg/dL (ref 20–172)
M Protein SerPl Elph-Mcnc: 0.7 g/dL — ABNORMAL HIGH
Total Protein ELP: 6.8 g/dL (ref 6.0–8.5)

## 2017-12-01 ENCOUNTER — Inpatient Hospital Stay (HOSPITAL_BASED_OUTPATIENT_CLINIC_OR_DEPARTMENT_OTHER): Payer: Medicare Other | Admitting: Hematology

## 2017-12-01 ENCOUNTER — Encounter: Payer: Self-pay | Admitting: Hematology

## 2017-12-01 ENCOUNTER — Other Ambulatory Visit: Payer: Medicare Other

## 2017-12-01 ENCOUNTER — Telehealth: Payer: Self-pay | Admitting: Hematology

## 2017-12-01 VITALS — BP 141/70 | HR 70 | Temp 98.3°F | Resp 20 | Ht 73.0 in | Wt 257.0 lb

## 2017-12-01 DIAGNOSIS — C7951 Secondary malignant neoplasm of bone: Secondary | ICD-10-CM | POA: Diagnosis not present

## 2017-12-01 DIAGNOSIS — E785 Hyperlipidemia, unspecified: Secondary | ICD-10-CM | POA: Diagnosis not present

## 2017-12-01 DIAGNOSIS — C9002 Multiple myeloma in relapse: Secondary | ICD-10-CM | POA: Diagnosis not present

## 2017-12-01 DIAGNOSIS — E119 Type 2 diabetes mellitus without complications: Secondary | ICD-10-CM | POA: Diagnosis not present

## 2017-12-01 DIAGNOSIS — Z79899 Other long term (current) drug therapy: Secondary | ICD-10-CM

## 2017-12-01 DIAGNOSIS — H409 Unspecified glaucoma: Secondary | ICD-10-CM | POA: Diagnosis not present

## 2017-12-01 NOTE — Telephone Encounter (Signed)
Scheduled appt per 2/15 los - Gave patient AVS and calender per los.  

## 2017-12-01 NOTE — Progress Notes (Signed)
[name]    HEMATOLOGY/ONCOLOGY CLINIC NOTE  Date of Service: 12/01/17   Patient Care Team: [name], MD as PCP - General (Family Medicine) [name] MD ([facility] _ primary oncology) phone number [phone] Email- [email or web address]  CHIEF COMPLAINTS  followup of multiple myeloma.  HISTORY OF PRESENTING ILLNESS:   Michael [name] is a wonderful 67 y.o. 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 Patient is currently on a study protocol and is being monitored for myeloma recurrence.  Based on available records being put together  -Patient was diagnosed with IgG kappa ISS stage I multiple myeloma with hyperdiploid complex cytogenetics in 2012. Bone marrow biopsy apparently showed 50% kappa restricted plasma cells with an initial M spike of 3.4 g/dL with evidence of lytic lesions on his PET/CT scan including right rib fracture and lesions of the lumbar and cervical spine. -Patient was treated under protocol 11-C-0221 with from 02/21/2013 with  Carfilzomib/Revlimid/Dexamethasone for a total of 8 cycles. He reports that his stem cells were collected and stored after 4 cycles -Posttreatment bone marrow biopsy showed less than 5% plasma cells with a negative flow cytometry and improvement in his previously PET avid lesions. Some concern for new focus of activity at C2 lytic lesion at L4 and several small lesions throughout the vertebrae. -Patient was on maintenance lenalidomide 10 mg by mouth daily for 2 years or more until end of 2014. 08/19/2013 - bone marrow biopsy showed normocellular marrow with less than 5% plasma cells. PET CT scan showed decreased focal metabolic  ligament prior rib lesions and no new lesions. Flow cytometry was negative for residual disease. Serum and urine electrophoresis and IFE showed no evidence of monoclonal protein. -Patient notes that he was off protocol for a few years due to lack of clinical trial research funding. -Patient notes that he is back on a follow-up protocol and continues to follow and receive his primary treatment at [facility] at this time. -Patient reports he had his last PET CT scan blood and urine tests and bone marrow examination in February 2018. The results of which are not available to [place] currently.  He notes that there was concern for a very slowly growing sternal FDG avid lesion that has been present for years. He has a follow-up at [facility] next month to determine the management of this lesion. He reports that was some consideration of considering radiation. he notes that this lesion has not been biopsied. No other focal bone pains at this time.  We discussed in detail about what our role will be and noted that we shall be available for any acute issues that need to be taken care of locally but that at this time the primary treatment and evaluation will be driven by his team at [facility] as per their research protocols and input. The patient and his wife are clearly aware of this and are in agreement with this plan.  INTERVAL HISTORY  Patient comes is here for continued management of his MM. The patient has been doing well. He has been tolerating his Maintenance Revlimid without any prohibitive toxicities. He is not experiencing any new arthritis besides in his knees. He has not been doing any physical activity because of the weather,  however, he tries to stay active. He wants to continue to loose weight. He adds that he is having a hard time with sugar, he notes watching his blood sugars, but is wondering if there a correlation between blood sugars and cancer. He reports that he has cut back on the amount of red meat  that he eats. The patient is wondering if it is possible for him to undergo knee surgery. He was taking B12 and is wondering if he needs to restart taking it.  On ROS, pt reports weakness to the legs and denies swelling, fever, chills, night sweats, mouth sores, back pain, abdominal pain, rashes, chest wall pain and any other accompanying symptoms.   MEDICAL HISTORY:  Past Medical History:  Diagnosis Date  . Angioedema   . Gastroesophageal reflux disease without esophagitis   . Hyperlipidemia   . Multiple myeloma ([place]) 2012   Treated at [facility]  . Non morbid obesity due to excess calories   . Prediabetes   . Prediabetes   . Unspecified glaucoma(365.9)   . Urticaria, unspecified   Diabetes mellitus Glaucoma Left toes neuropathy  GERD  SURGICAL HISTORY: Past Surgical History:  Procedure Laterality Date  . PORTACATH PLACEMENT      SOCIAL HISTORY: Social History   Socioeconomic History  . Marital status: Married    Spouse name: Not on file  . Number of children: 2  . Years of education: Not on file  . Highest education level: Not on file  Social Needs  . Financial resource strain: Not on file  . Food insecurity - worry: Not on file  . Food insecurity - inability: Not on file  . Transportation needs - medical: Not on file  . Transportation needs - non-medical: Not on file  Occupational History  . Occupation: Retired Chief Strategy Officer for [employer]  . Smoking status: Never Smoker  . Smokeless tobacco: Never Used  Substance and Sexual Activity  . Alcohol use: Yes    Alcohol/week: 0.0 oz    Comment: Occasional beer  . Drug use: No  . Sexual activity: Not on file  Other Topics Concern  . Not on file  Social History Narrative  . Not on file  Never smoker Married Retired from the [employer] in June 2017 and moved to [facility].  FAMILY HISTORY: Family History  Problem Relation Age of Onset  . Cancer Father     ALLERGIES:  has No Known  Allergies.  MEDICATIONS:  Current Outpatient Medications  Medication Sig Dispense Refill  . aspirin 81 MG tablet Take 81 mg by mouth daily.    [name] atorvastatin (LIPITOR) 20 MG tablet Take 20 mg by mouth daily.    . Calcium Carbonate-Vitamin D (CALCIUM-D) 600-400 MG-UNIT TABS Take by mouth 2 (two) times daily.    [name] lenalidomide (REVLIMID) 15 MG capsule Take 1 capsule (15 mg total) by mouth daily. Take days 1-21 every 28 days. Josem Kaufmann #[id number] 11/03/17 21 capsule 0  . naproxen (NAPROSYN) 500 MG tablet Take 500 mg by mouth as needed.     . pantoprazole (PROTONIX) 40 MG tablet Take 40 mg by mouth as needed.     . vitamin B-12 (CYANOCOBALAMIN) 1000 MCG tablet Take 1,000 mcg by mouth daily.     No current facility-administered medications for this visit.     REVIEW OF SYSTEMS:    .10 Point review of Systems was done is negative except as noted above.  PHYSICAL EXAMINATION: ECOG PERFORMANCE STATUS: 1 -  Symptomatic but completely ambulatory  Vitals:   12/01/17 1107  BP: (!) 141/70  Pulse: 70  Resp: 20  Temp: 98.3 F (36.8 C)  TempSrc: Oral  SpO2: 96%  Weight: 257 lb (116.6 kg)  Height: 6' 1"  (1.854 m)   Body mass index is 33.91 kg/m. [name] GENERAL:alert, in no acute distress and comfortable SKIN: no acute rashes, no significant lesions EYES: conjunctiva are pink and non-injected, sclera anicteric OROPHARYNX: MMM, no exudates, no oropharyngeal erythema or ulceration NECK: supple, no JVD LYMPH:  no palpable lymphadenopathy in the cervical, axillary or inguinal regions LUNGS: clear to auscultation b/l with normal respiratory effort HEART: regular rate & rhythm ABDOMEN:  normoactive bowel sounds , non tender, not distended. Extremity: no pedal edema PSYCH: alert & oriented x 3 with fluent speech NEURO: no focal motor/sensory deficits   LABORATORY DATA:  I have reviewed the data as listed  . CBC Latest Ref Rng & Units 11/24/2017 10/31/2017 10/03/2017  WBC 4.0 - 10.3 K/uL 3.4(L) 4.2  3.3(L)  Hemoglobin 13.0 - 17.1 g/dL - - 14.8  Hematocrit 38.4 - 49.9 % 42.1 41.0 43.2  Platelets 140 - 400 K/uL 162 143 166    . CMP Latest Ref Rng & Units 11/24/2017 10/31/2017 10/03/2017  Glucose 70 - 140 mg/dL 110 116 103  BUN 7 - 26 mg/dL 11 10 13.7  Creatinine 0.70 - 1.30 mg/dL 0.77 0.86 0.8  Sodium 136 - 145 mmol/L 139 139 140  Potassium 3.5 - 5.1 mmol/L 3.7 3.8 3.8  Chloride 98 - 109 mmol/L 107 105 -  CO2 22 - 29 mmol/L 24 26 27   Calcium 8.4 - 10.4 mg/dL 8.8 8.9 9.2  Total Protein 6.4 - 8.3 g/dL 6.8 6.9 7.1  Total Bilirubin 0.2 - 1.2 mg/dL 0.9 1.1 1.54(H)  Alkaline Phos 40 - 150 U/L 70 72 79  AST 5 - 34 U/L 19 24 22   ALT 0 - 55 U/L 25 41 30                    RADIOGRAPHIC STUDIES: I have personally reviewed the radiological images as listed and agreed with the findings in the report. No results found.  ASSESSMENT & PLAN:   67 y.o. 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 Bone marrow biopsy apparently showed 50% kappa restricted plasma cells with an initial M spike of 3.4 g/dL with evidence of lytic lesions on his PET/CT scan including right rib fracture and lesions of the lumbar and cervical spine. -Patient was treated under protocol 11-C-0221 with from 02/21/2013 with  Carfilzomib/Revlimid/Dexamethasone for a total of 8 cycles. He reports that his stem cells were collected and stored after 4 cycles -Posttreatment bone marrow biopsy showed less than 5% plasma cells with a negative flow cytometry and improvement in his previously PET avid lesions. Some concern for new focus of activity at C2 lytic lesion at L4 and several small lesions throughout the vertebrae. -Patient was on maintenance lenalidomide 10 mg by mouth daily for 2 years or more until end of 2014. 08/19/2013 - bone marrow biopsy showed normocellular marrow with less  than 5% plasma cells. PET CT scan showed decreased focal metabolic ligament prior rib lesions and no new lesions. Flow cytometry was negative for residual disease. Serum and urine electrophoresis and IFE showed no evidence of monoclonal protein. -Patient notes that he was off protocol  for a few years due to lack of clinical trial research funding. -Patient notes that he is back on a follow-up protocol and continues to follow and receive his primary treatment at [facility] at this time.  -Patient's labs show minimal IgG kappa protein on IFE and a CT chest in May 2018 that did not show any overt signs of myeloma progression in the bones.  Rpt Myeloma labs 03/2017 - M spike of 0.3g/dl with a repeat M spike stable at 0.3 g/dL on 05/23/2017 which remains stable on labs today 07/25/17 Serum kappa lambda free light chain ratio not significantly changed. PET/CT scan showed a single metabolically active sternal lesion without any overt bony destruction. Bone marrow biopsy done on 06/06/2017 - shows no overt involvement with multiple myeloma.  2) Elevated bilirubin level . Repeat labs today show normalization of his bilirubin levels after switching to a three-week on one-week off regimen with the Revlimid.  Plan  -Discussed lab results with the patient. M spike persistent and is increasing gradually to 0.7 g/dl with stable serum kappa lambda free light chain ratios. -no new bone pains or other focal symptoms. -patient was recommended but wanted to hold off on additional imaging at this time since he notes that he might be having BM Bx and PET/CT at [facility] in April or so. --no prohibitive toxicities from Zometa or Revlimid maintenance at this time. -continue Revlimid dose at 69m po daily --3weeks on 1 week off. - continue on Zometa q8weeks to treat his myeloma-related skeletal disease.   3) PET/CT abnormality in the Prostate. PSA levels WNL -Primary care physician to consider urology referral  4) Abnormal focus  of FDG avid at a spot in the liver - will need to be monitor on f/u scans. No pain or other symptoms at this time.  Labs in 7 weeks RTC with Dr KIrene Limboin 8 weeks Continue Zometa q8weeks as per orders  All of the patients questions were answered with apparent satisfaction. The patient knows to call the clinic with any problems, questions or concerns.  . The total time spent in the appointment was 20 minutes and more than 50% was on counseling and direct patient cares.      GSullivan LoneMD MPoynetteAAHIVMS SChi [facility] Hospital-GeorgiaCKaiser Fnd Hosp - AnaheimHematology/Oncology Physician [facility] (Office):       [phone](Work cell):  [phone](Fax):           [phone] This document serves as a record of services personally performed by GSullivan Lone MD. It was created on his behalf by JMargit Banda a trained medical scribe. The creation of this record is based on the scribe's personal observations and the provider's statements to them.   .I have reviewed the above documentation for accuracy and completeness, and I agree with the above. .Brunetta GeneraMD MS

## 2017-12-06 ENCOUNTER — Other Ambulatory Visit: Payer: Self-pay | Admitting: *Deleted

## 2017-12-06 MED ORDER — LENALIDOMIDE 15 MG PO CAPS: 15.0000 mg | ORAL_CAPSULE | Freq: Every day | ORAL | 0 refills | Status: DC

## 2017-12-25 DIAGNOSIS — J3489 Other specified disorders of nose and nasal sinuses: Secondary | ICD-10-CM | POA: Diagnosis not present

## 2017-12-25 DIAGNOSIS — G4733 Obstructive sleep apnea (adult) (pediatric): Secondary | ICD-10-CM | POA: Diagnosis not present

## 2018-01-02 ENCOUNTER — Other Ambulatory Visit: Payer: Self-pay | Admitting: *Deleted

## 2018-01-02 MED ORDER — LENALIDOMIDE 15 MG PO CAPS: 15.0000 mg | ORAL_CAPSULE | Freq: Every day | ORAL | 0 refills | Status: DC

## 2018-01-17 DIAGNOSIS — H401131 Primary open-angle glaucoma, bilateral, mild stage: Secondary | ICD-10-CM | POA: Diagnosis not present

## 2018-01-18 DIAGNOSIS — J029 Acute pharyngitis, unspecified: Secondary | ICD-10-CM | POA: Diagnosis not present

## 2018-01-19 ENCOUNTER — Inpatient Hospital Stay: Payer: Medicare Other | Attending: Hematology

## 2018-01-19 ENCOUNTER — Encounter: Payer: Self-pay | Admitting: Podiatry

## 2018-01-19 ENCOUNTER — Ambulatory Visit (INDEPENDENT_AMBULATORY_CARE_PROVIDER_SITE_OTHER): Payer: Medicare Other | Admitting: Podiatry

## 2018-01-19 DIAGNOSIS — M79672 Pain in left foot: Secondary | ICD-10-CM

## 2018-01-19 DIAGNOSIS — C7951 Secondary malignant neoplasm of bone: Secondary | ICD-10-CM | POA: Diagnosis not present

## 2018-01-19 DIAGNOSIS — Z79899 Other long term (current) drug therapy: Secondary | ICD-10-CM | POA: Diagnosis not present

## 2018-01-19 DIAGNOSIS — K219 Gastro-esophageal reflux disease without esophagitis: Secondary | ICD-10-CM | POA: Insufficient documentation

## 2018-01-19 DIAGNOSIS — E119 Type 2 diabetes mellitus without complications: Secondary | ICD-10-CM | POA: Diagnosis not present

## 2018-01-19 DIAGNOSIS — Q828 Other specified congenital malformations of skin: Secondary | ICD-10-CM

## 2018-01-19 DIAGNOSIS — Z7982 Long term (current) use of aspirin: Secondary | ICD-10-CM | POA: Insufficient documentation

## 2018-01-19 DIAGNOSIS — C9002 Multiple myeloma in relapse: Secondary | ICD-10-CM | POA: Diagnosis not present

## 2018-01-19 DIAGNOSIS — H409 Unspecified glaucoma: Secondary | ICD-10-CM | POA: Diagnosis not present

## 2018-01-19 DIAGNOSIS — E785 Hyperlipidemia, unspecified: Secondary | ICD-10-CM | POA: Diagnosis not present

## 2018-01-19 LAB — CBC WITH DIFFERENTIAL (CANCER CENTER ONLY)
Basophils Absolute: 0 10*3/uL (ref 0.0–0.1)
Basophils Relative: 1 %
EOS PCT: 4 %
Eosinophils Absolute: 0.1 10*3/uL (ref 0.0–0.5)
HCT: 43.1 % (ref 38.4–49.9)
Hemoglobin: 14.6 g/dL (ref 13.0–17.1)
LYMPHS ABS: 1.2 10*3/uL (ref 0.9–3.3)
LYMPHS PCT: 42 %
MCH: 31.2 pg (ref 27.2–33.4)
MCHC: 33.9 g/dL (ref 32.0–36.0)
MCV: 92.1 fL (ref 79.3–98.0)
MONO ABS: 0.3 10*3/uL (ref 0.1–0.9)
Monocytes Relative: 11 %
Neutro Abs: 1.2 10*3/uL — ABNORMAL LOW (ref 1.5–6.5)
Neutrophils Relative %: 42 %
PLATELETS: 165 10*3/uL (ref 140–400)
RBC: 4.68 MIL/uL (ref 4.20–5.82)
RDW: 14 % (ref 11.0–14.6)
WBC Count: 2.9 10*3/uL — ABNORMAL LOW (ref 4.0–10.3)

## 2018-01-19 LAB — COMPREHENSIVE METABOLIC PANEL
ALBUMIN: 3.9 g/dL (ref 3.5–5.0)
ALT: 31 U/L (ref 0–55)
AST: 19 U/L (ref 5–34)
Alkaline Phosphatase: 64 U/L (ref 40–150)
Anion gap: 8 (ref 3–11)
BUN: 13 mg/dL (ref 7–26)
CHLORIDE: 108 mmol/L (ref 98–109)
CO2: 24 mmol/L (ref 22–29)
Calcium: 9.1 mg/dL (ref 8.4–10.4)
Creatinine, Ser: 0.77 mg/dL (ref 0.70–1.30)
GFR calc Af Amer: >60 mL/min (ref >60)
GFR calc non Af Amer: >60 mL/min (ref >60)
GLUCOSE: 122 mg/dL (ref 70–140)
POTASSIUM: 3.6 mmol/L (ref 3.5–5.1)
Sodium: 140 mmol/L (ref 136–145)
Total Bilirubin: 0.9 mg/dL (ref 0.2–1.2)
Total Protein: 6.8 g/dL (ref 6.4–8.3)

## 2018-01-19 LAB — RETICULOCYTES
RBC.: 4.68 MIL/uL (ref 4.20–5.82)
Retic Count, Absolute: 60.8 10*3/uL (ref 34.8–93.9)
Retic Ct Pct: 1.3 % (ref 0.8–1.8)

## 2018-01-19 NOTE — Progress Notes (Signed)
Subjective:    Patient ID: Michael [name], male    DOB: Aug 26, 1951, 67 y.o.   MRN: [id number]  HPI 67 year old male presents the office today for concerns of a painful callus to the ball of his left foot which is been ongoing for greater than 6 months.  He has treated himself.  He denies any recent injury he denies any swelling or redness from the area.  The area is painful with prolonged weightbearing or pressure.  No other concerns.   Review of Systems  All other systems reviewed and are negative.  Past Medical History:  Diagnosis Date  . Angioedema   . Gastroesophageal reflux disease without esophagitis   . Hyperlipidemia   . Multiple myeloma ([place]) 2012   Treated at [facility]  . Non morbid obesity due to excess calories   . Prediabetes   . Prediabetes   . Unspecified glaucoma(365.9)   . Urticaria, unspecified     Past Surgical History:  Procedure Laterality Date  . PORTACATH PLACEMENT       Current Outpatient Medications:  .  aspirin 81 MG tablet, Take 81 mg by mouth daily., Disp: , Rfl:  .  atorvastatin (LIPITOR) 20 MG tablet, Take 20 mg by mouth daily., Disp: , Rfl:  .  Calcium Carbonate-Vitamin D (CALCIUM-D) 600-400 MG-UNIT TABS, Take by mouth 2 (two) times daily., Disp: , Rfl:  .  lenalidomide (REVLIMID) 15 MG capsule, Take 1 capsule (15 mg total) by mouth daily. Take days 1-21 every 28 days. Josem Kaufmann #[id number], Disp: 21 capsule, Rfl: 0 .  naproxen (NAPROSYN) 500 MG tablet, Take 500 mg by mouth as needed. , Disp: , Rfl:  .  pantoprazole (PROTONIX) 40 MG tablet, Take 40 mg by mouth as needed. , Disp: , Rfl:  .  vitamin B-12 (CYANOCOBALAMIN) 1000 MCG tablet, Take 1,000 mcg by mouth daily., Disp: , Rfl:   No Known Allergies  Social History   Socioeconomic History  . Marital status: Married    Spouse name: Not on file  . Number of children: 2  . Years of education: Not on file  . Highest education level: Not on file  Occupational History  . Occupation: Retired Chief Strategy Officer  for [employer]  . Financial resource strain: Not on file  . Food insecurity:    Worry: Not on file    Inability: Not on file  . Transportation needs:    Medical: Not on file    Non-medical: Not on file  Tobacco Use  . Smoking status: Never Smoker  . Smokeless tobacco: Never Used  Substance and Sexual Activity  . Alcohol use: Yes    Alcohol/week: 0.0 oz    Comment: Occasional beer  . Drug use: No  . Sexual activity: Not on file  Lifestyle  . Physical activity:    Days per week: Not on file    Minutes per session: Not on file  . Stress: Not on file  Relationships  . Social connections:    Talks on phone: Not on file    Gets together: Not on file    Attends religious service: Not on file    Active member of club or organization: Not on file    Attends meetings of clubs or organizations: Not on file    Relationship status: Not on file  . Intimate partner violence:    Fear of current or ex partner: Not on file    Emotionally abused: Not on file    Physically abused:  Not on file    Forced sexual activity: Not on file  Other Topics Concern  . Not on file  Social History Narrative  . Not on file         Objective:   Physical Exam General: AAO x3, NAD  Dermatological: Thick diffuse hyperkeratotic lesions present on the left foot submetatarsal 3 to the submetatarsal 5 area.  This is where he gets tenderness mostly is mostly on the submetatarsal 4 on the thicker area of the callus.  Upon debridement there is no underlying ulceration, drainage or any signs of infection and there is no evidence of verruca.  There is no surrounding erythema, ascending cellulitis.  There is no fluctuation or crepitation.  No malodor.  No open lesions.  Vascular: Dorsalis Pedis artery and Posterior Tibial artery pedal pulses are 2/4 bilateral with immedate capillary fill time.  There is no pain with calf compression, swelling, warmth, erythema.   Neruologic: Grossly intact via  light touch bilateral. Protective threshold with Semmes Wienstein monofilament intact to all pedal sites bilateral.   Musculoskeletal: Prominent metatarsal heads plantarly.  Tenderness only to palpation on the plantar submetatarsal 4 area on the callus.  No pain to the dorsal aspect of the metatarsals or to other areas of the foot.  Muscular strength 5/5 in all groups tested bilateral.  Gait: Unassisted, Nonantalgic.     Assessment & Plan:  66 year old male with left thick hyperkeratotic lesion -Treatment options discussed including all alternatives, risks, and complications -Etiology of symptoms were discussed -Lesion was sharply debrided without any complications or bleeding.  Discussed with him moisturizer to the area daily he can use a pumice stone as well.  Offloading pads were dispensed, metatarsal pads. -RTC prn  Trula Slade DPM

## 2018-01-22 LAB — KAPPA/LAMBDA LIGHT CHAINS
Kappa free light chain: 20 mg/L — ABNORMAL HIGH (ref 3.3–19.4)
Kappa, lambda light chain ratio: 2 — ABNORMAL HIGH (ref 0.26–1.65)
Lambda free light chains: 10 mg/L (ref 5.7–26.3)

## 2018-01-22 NOTE — Progress Notes (Signed)
[name]    HEMATOLOGY/ONCOLOGY CLINIC NOTE  Date of Service: 01/25/18   Patient Care Team: Michael Lass, MD as PCP - General (Family Medicine) Michael Riley MD ([facility] _ primary oncology) phone number [phone] Email- [email or web address]  CHIEF COMPLAINTS  followup of multiple myeloma.  HISTORY OF PRESENTING ILLNESS:   Michael [name] is a wonderful 67 y.o. male , retired [employer] who has been referred to [place] by Dr .Sabra [name], [name], MD for establishing local oncology care "In case needed for treatment/management of complications pertaining to myeloma"  Patient has a history of multiple myeloma and is currently being followed actively managed at [facility] by MichaelDickran Jaclyn Prime MD who is his primary oncologist. Patient is currently on a study protocol and is being monitored for myeloma recurrence.  Based on available records being put together  -Patient was diagnosed with IgG kappa ISS stage I multiple myeloma with hyperdiploid complex cytogenetics in 2012. Bone marrow biopsy apparently showed 50% kappa restricted plasma cells with an initial M spike of 3.4 g/dL with evidence of lytic lesions on his PET/CT scan including right rib fracture and lesions of the lumbar and cervical spine. -Patient was treated under protocol 11-C-0221 with from 02/21/2013 with  Carfilzomib/Revlimid/Dexamethasone for a total of 8 cycles. He reports that his stem cells were collected and stored after 4 cycles -Posttreatment bone marrow biopsy showed less than 5% plasma cells with a negative flow cytometry and improvement in his previously PET avid lesions. Some concern for new focus of activity at C2 lytic lesion at L4 and several small lesions throughout the vertebrae. -Patient was on maintenance lenalidomide 10 mg by mouth daily for 2 years or more until end of 2014. 08/19/2013 - bone marrow biopsy showed normocellular marrow with less than 5% plasma cells. PET CT scan showed decreased focal metabolic  ligament prior rib lesions and no new lesions. Flow cytometry was negative for residual disease. Serum and urine electrophoresis and IFE showed no evidence of monoclonal protein. -Patient notes that he was off protocol for a few years due to lack of clinical trial research funding. -Patient notes that he is back on a follow-up protocol and continues to follow and receive his primary treatment at [facility] at this time. -Patient reports he had his last PET CT scan blood and urine tests and bone marrow examination in February 2018. The results of which are not available to [place] currently.  He notes that there was concern for a very slowly growing sternal FDG avid lesion that has been present for years. He has a follow-up at [place] next month to determine the management of this lesion. He reports that was some consideration of considering radiation. he notes that this lesion has not been biopsied. No other focal bone pains at this time.  We discussed in detail about what our role will be and noted that we shall be available for any acute issues that need to be taken care of locally but that at this time the primary treatment and evaluation will be driven by his team at [facility] as per their research protocols and input. The patient and his wife are clearly aware of this and are in agreement with this plan.  INTERVAL HISTORY  Michael [name] comes is here for continued management of his MM. The patient's last visit with [place] was on 12/01/17. The pt reports that he is doing well overall and has been enjoying his new cat.   The pt reports that he has no concerns and has been  tolerating the higher dose of Revlimid very well. He notes that he was on the fifth day of his cycle when his 01/19/18 labs were drawn, which are noted below.   He notes that he will be getting labs drawn at the [facility] on May 8.   Lab results (01/19/18) of CBC, CMP, and Reticulocytes is as follows: all values are WNL except for WBC at 2.9k,  Neutro Abs at 1.2k. Kappa/Lambda 01/19/18 shows Kappa slightly elevated at 20 and K:L ratio slightly elevated at 2.00. MMP 01/19/18 shows all values WNL except for M Protein at 0.5  On review of systems, pt reports good energy levels, persisting glaucoma, and denies new bone pains, chest discomfort, mouth sores, abdominal pain, and any other symptoms.   MEDICAL HISTORY:  Past Medical History:  Diagnosis Date  . Angioedema   . Gastroesophageal reflux disease without esophagitis   . Hyperlipidemia   . Multiple myeloma ([place]) 2012   Treated at [facility]  . Non morbid obesity due to excess calories   . Prediabetes   . Prediabetes   . Unspecified glaucoma(365.9)   . Urticaria, unspecified   Diabetes mellitus Glaucoma Left toes neuropathy  GERD  SURGICAL HISTORY: Past Surgical History:  Procedure Laterality Date  . PORTACATH PLACEMENT      SOCIAL HISTORY: Social History   Socioeconomic History  . Marital status: Married    Spouse name: Not on file  . Number of children: 2  . Years of education: Not on file  . Highest education level: Not on file  Occupational History  . Occupation: Retired Chief Strategy Officer for [employer]  . Financial resource strain: Not on file  . Food insecurity:    Worry: Not on file    Inability: Not on file  . Transportation needs:    Medical: Not on file    Non-medical: Not on file  Tobacco Use  . Smoking status: Never Smoker  . Smokeless tobacco: Never Used  Substance and Sexual Activity  . Alcohol use: Yes    Alcohol/week: 0.0 oz    Comment: Occasional beer  . Drug use: No  . Sexual activity: Not on file  Lifestyle  . Physical activity:    Days per week: Not on file    Minutes per session: Not on file  . Stress: Not on file  Relationships  . Social connections:    Talks on phone: Not on file    Gets together: Not on file    Attends religious service: Not on file    Active member of club or organization: Not on file    Attends  meetings of clubs or organizations: Not on file    Relationship status: Not on file  . Intimate partner violence:    Fear of current or ex partner: Not on file    Emotionally abused: Not on file    Physically abused: Not on file    Forced sexual activity: Not on file  Other Topics Concern  . Not on file  Social History Narrative  . Not on file  Never smoker Married Retired from the [employer] in June 2017 and moved to [facility].  FAMILY HISTORY: Family History  Problem Relation Age of Onset  . Cancer Father     ALLERGIES:  has No Known Allergies.  MEDICATIONS:  Current Outpatient Medications  Medication Sig Dispense Refill  . aspirin 81 MG tablet Take 81 mg by mouth daily.    [name] atorvastatin (LIPITOR) 20  MG tablet Take 20 mg by mouth daily.    . Calcium Carbonate-Vitamin D (CALCIUM-D) 600-400 MG-UNIT TABS Take by mouth 2 (two) times daily.    . fluticasone (FLONASE) 50 MCG/ACT nasal spray Place 1 spray into both nostrils daily.  0  . lenalidomide (REVLIMID) 15 MG capsule Take 1 capsule (15 mg total) by mouth daily. Take days 1-21 every 28 days. Auth #[id number] 21 capsule 0  . mupirocin ointment (BACTROBAN) 2 % 3 (three) times daily.    . naproxen (NAPROSYN) 500 MG tablet Take 500 mg by mouth as needed.     . pantoprazole (PROTONIX) 40 MG tablet Take 40 mg by mouth as needed.     . ranitidine (ZANTAC) 150 MG capsule Take 1 capsule by mouth at bedtime.  0  . vitamin B-12 (CYANOCOBALAMIN) 1000 MCG tablet Take 1,000 mcg by mouth daily.     No current facility-administered medications for this visit.     REVIEW OF SYSTEMS:   .10 Point review of Systems was done is negative except as noted above.   PHYSICAL EXAMINATION: ECOG PERFORMANCE STATUS: 1 - Symptomatic but completely ambulatory  Vitals:   01/25/18 0916  BP: 131/71  Pulse: 66  Resp: 18  Temp: 99 F (37.2 C)  TempSrc: Oral  SpO2: 98%  Weight: 251 lb 12.8 oz (114.2 kg)  Height: 6' 1"  (1.854 m)    Body mass index is 33.22 kg/m.  [name] GENERAL:alert, in no acute distress and comfortable SKIN: no acute rashes, no significant lesions EYES: conjunctiva are pink and non-injected, sclera anicteric OROPHARYNX: MMM, no exudates, no oropharyngeal erythema or ulceration NECK: supple, no JVD LYMPH:  no palpable lymphadenopathy in the cervical, axillary or inguinal regions LUNGS: clear to auscultation b/l with normal respiratory effort HEART: regular rate & rhythm ABDOMEN:  normoactive bowel sounds , non tender, not distended. Extremity: no pedal edema PSYCH: alert & oriented x 3 with fluent speech NEURO: no focal motor/sensory deficits  LABORATORY DATA:  I have reviewed the data as listed  . CBC Latest Ref Rng & Units 01/19/2018 11/24/2017 10/31/2017  WBC 4.0 - 10.3 K/uL 2.9(L) 3.4(L) 4.2  Hemoglobin 13.0 - 17.1 g/dL 14.6 14.6 14.1  Hematocrit 38.4 - 49.9 % 43.1 42.1 41.0  Platelets 140 - 400 K/uL 165 162 143  ANC 1200  . CMP Latest Ref Rng & Units 01/19/2018 11/24/2017 10/31/2017  Glucose 70 - 140 mg/dL 122 110 116  BUN 7 - 26 mg/dL 13 11 10   Creatinine 0.70 - 1.30 mg/dL 0.77 0.77 0.86  Sodium 136 - 145 mmol/L 140 139 139  Potassium 3.5 - 5.1 mmol/L 3.6 3.7 3.8  Chloride 98 - 109 mmol/L 108 107 105  CO2 22 - 29 mmol/L 24 24 26   Calcium 8.4 - 10.4 mg/dL 9.1 8.8 8.9  Total Protein 6.4 - 8.3 g/dL 6.8 6.8 6.9  Total Bilirubin 0.2 - 1.2 mg/dL 0.9 0.9 1.1  Alkaline Phos 40 - 150 U/L 64 70 72  AST 5 - 34 U/L 19 19 24   ALT 0 - 55 U/L 31 25 41             RADIOGRAPHIC STUDIES: I have personally reviewed the radiological images as listed and agreed with the findings in the report. No results found.  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 Bone marrow  biopsy apparently showed 50% kappa restricted plasma cells with  an initial M spike of 3.4 g/dL with evidence of lytic lesions on his PET/CT scan including right rib fracture and lesions of the lumbar and cervical spine. -Patient was treated under protocol 11-C-0221 with from 02/21/2013 with  Carfilzomib/Revlimid/Dexamethasone for a total of 8 cycles. He reports that his stem cells were collected and stored after 4 cycles -Posttreatment bone marrow biopsy showed less than 5% plasma cells with a negative flow cytometry and improvement in his previously PET avid lesions. Some concern for new focus of activity at C2 lytic lesion at L4 and several small lesions throughout the vertebrae. -Patient was on maintenance lenalidomide 10 mg by mouth daily for 2 years or more until end of 2014. 08/19/2013 - bone marrow biopsy showed normocellular marrow with less than 5% plasma cells. PET CT scan showed decreased focal metabolic ligament prior rib lesions and no new lesions. Flow cytometry was negative for residual disease. Serum and urine electrophoresis and IFE showed no evidence of monoclonal protein. -Patient notes that he was off protocol for a few years due to lack of clinical trial research funding. -Patient notes that he is back on a follow-up protocol and continues to follow and receive his primary treatment at [facility] at this time.  -Patient's labs show minimal IgG kappa protein on IFE and a CT chest in May 2018 that did not show any overt signs of myeloma progression in the bones.  Rpt Myeloma labs 03/2017 - M spike of 0.3g/dl with a repeat M spike stable at 0.3 g/dL on 05/23/2017 which remains stable on labs today 07/25/17 Serum kappa lambda free light chain ratio not significantly changed. PET/CT scan showed a single metabolically active sternal lesion without any overt bony destruction. Bone marrow biopsy done on 06/06/2017 - shows no overt involvement with multiple myeloma.  2) Elevated bilirubin level . Repeat labs today show normalization of his bilirubin levels after  switching to a three-week on one-week off regimen with the Revlimid.  Plan -no new bone pains or other focal symptoms. -patient was recommended but wanted to hold off on additional imaging at this time since he notes that he might be having BM Bx and PET/[facility] at [facility] in may or so. -no prohibitive toxicities from Zometa or Revlimid maintenance at this time. -continue Revlimid dose at 75m po daily --3weeks on 1 week off. -Discussed pt labwork today; ANC is borderline low at 1.2k;  blood counts and chemistries are otherwise normal; M protein levels has decreased from 0.7 to 0.5 today. Liver functions stabilized.  -If ABryancontinues to drop on next labs, we will evaluate Revlimid dosing again.  -We will check labs again on 4/29 or 4/30 before he begins his next cycle on 5/1.  -No prohibitive toxicities from Revlimid or Zometa at this time. -[place]   3) PET/CT abnormality in the Prostate. PSA levels WNL -Primary care physician to consider urology referral  4) Abnormal focus of FDG avid at a spot in the liver - will need to be monitor on f/u scans. No pain or other symptoms at this time. -rpt [place] in the next couple of months  Labs 4/30 Labs 5/30 Continue IV Zometa q8weeks RTC June 6th 2019 with Dr KIrene Limbo  All of the patients questions were answered with apparent satisfaction. The patient knows to call the clinic with any problems, questions or concerns.  . The total time spent in the  appointment was 20 minutes and more than 50% was on counseling and direct patient cares.    Sullivan Lone MD [place] AAHIVMS [facility] Hematology/Oncology Physician [facility]  (Office):       [phone] (Work cell):  [phone] (Fax):           [phone]  This document serves as a record of services personally performed by Sullivan Lone, MD. It was created on his behalf by Baldwin Jamaica, a trained medical scribe. The creation of this record is based on the scribe's personal  observations and the provider's statements to them.   .I have reviewed the above documentation for accuracy and completeness, and I agree with the above. [name] MD MS

## 2018-01-24 LAB — MULTIPLE MYELOMA PANEL, SERUM
ALBUMIN/GLOB SERPL: 1.4 (ref 0.7–1.7)
Albumin SerPl Elph-Mcnc: 3.7 g/dL (ref 2.9–4.4)
Alpha 1: 0.2 g/dL (ref 0.0–0.4)
Alpha2 Glob SerPl Elph-Mcnc: 0.6 g/dL (ref 0.4–1.0)
B-Globulin SerPl Elph-Mcnc: 1.1 g/dL (ref 0.7–1.3)
GAMMA GLOB SERPL ELPH-MCNC: 1 g/dL (ref 0.4–1.8)
GLOBULIN, TOTAL: 2.8 g/dL (ref 2.2–3.9)
IGA: 122 mg/dL (ref 61–437)
IGM (IMMUNOGLOBULIN M), SRM: 24 mg/dL (ref 20–172)
IgG (Immunoglobin G), Serum: 1035 mg/dL (ref 700–1600)
M Protein SerPl Elph-Mcnc: 0.5 g/dL — ABNORMAL HIGH
Total Protein ELP: 6.5 g/dL (ref 6.0–8.5)

## 2018-01-25 ENCOUNTER — Inpatient Hospital Stay: Payer: Medicare Other

## 2018-01-25 ENCOUNTER — Telehealth: Payer: Self-pay | Admitting: Hematology

## 2018-01-25 ENCOUNTER — Inpatient Hospital Stay (HOSPITAL_BASED_OUTPATIENT_CLINIC_OR_DEPARTMENT_OTHER): Payer: Medicare Other | Admitting: Hematology

## 2018-01-25 VITALS — BP 131/71 | HR 66 | Temp 99.0°F | Resp 18 | Ht 73.0 in | Wt 251.8 lb

## 2018-01-25 DIAGNOSIS — C7951 Secondary malignant neoplasm of bone: Secondary | ICD-10-CM | POA: Diagnosis not present

## 2018-01-25 DIAGNOSIS — E785 Hyperlipidemia, unspecified: Secondary | ICD-10-CM | POA: Diagnosis not present

## 2018-01-25 DIAGNOSIS — H409 Unspecified glaucoma: Secondary | ICD-10-CM | POA: Diagnosis not present

## 2018-01-25 DIAGNOSIS — C9002 Multiple myeloma in relapse: Secondary | ICD-10-CM

## 2018-01-25 DIAGNOSIS — E119 Type 2 diabetes mellitus without complications: Secondary | ICD-10-CM | POA: Diagnosis not present

## 2018-01-25 MED ORDER — ZOLEDRONIC ACID 4 MG/100ML IV SOLN
4.0000 mg | Freq: Once | INTRAVENOUS | Status: AC
  Administered 2018-01-25: 4 mg via INTRAVENOUS
  Filled 2018-01-25: qty 100

## 2018-01-25 NOTE — Patient Instructions (Signed)

## 2018-01-25 NOTE — Telephone Encounter (Signed)
Scheduled appt per 4/11 los - Gave patient AVS and calender per los.  

## 2018-01-25 NOTE — Patient Instructions (Signed)
Thank you for choosing [facility] to provide your oncology and hematology care.  To afford each patient quality time with our providers, please arrive 30 minutes before your scheduled appointment time.  If you arrive late for your appointment, you may be asked to reschedule.  We strive to give you quality time with our providers, and arriving late affects you and other patients whose appointments are after yours.   If you are a no show for multiple scheduled visits, you may be dismissed from the clinic at the providers discretion.    Again, thank you for choosing [facility], our hope is that these requests will decrease the amount of time that you wait before being seen by our physicians.  ______________________________________________________________________  Should you have questions after your visit to the [facility], please contact our office at [phone] between the hours of 8:30 and 4:30 p.m.    Voicemails left after 4:30p.m will not be returned until the following business day.    For prescription refill requests, please have your pharmacy contact us directly.  Please also try to allow 48 hours for prescription requests.    Please contact the scheduling department for questions regarding scheduling.  For scheduling of procedures such as PET scans, CT scans, MRI, Ultrasound, etc please contact central scheduling at [phone].    Resources For Cancer Patients and Caregivers:   Oncolink.org:  A wonderful resource for patients and healthcare providers for information regarding your disease, ways to tract your treatment, what to expect, etc.     American Cancer Society:  800-227-2345  Can help patients locate various types of support and financial assistance  Cancer Care: 1-800-813-HOPE (4673) Provides financial assistance, online support groups, medication/co-pay assistance.    [place] DSS:  336-641-3447 Where to apply for food  stamps, Medicaid, and utility assistance  Medicare Rights Center: 800-333-4114 Helps people with Medicare understand their rights and benefits, navigate the Medicare system, and secure the quality healthcare they deserve  [employer]AT: 336-333-6589  [employer]'s shared-ride transportation service for eligible riders who have a disability that prevents them from riding the fixed route bus.    For additional information on assistance programs please contact our social worker:   Grier Hock/Abigail Elmore:  [phone]            

## 2018-02-13 ENCOUNTER — Other Ambulatory Visit: Payer: Self-pay

## 2018-02-13 ENCOUNTER — Inpatient Hospital Stay: Payer: Medicare Other

## 2018-02-13 DIAGNOSIS — C9002 Multiple myeloma in relapse: Secondary | ICD-10-CM

## 2018-02-13 DIAGNOSIS — E119 Type 2 diabetes mellitus without complications: Secondary | ICD-10-CM | POA: Diagnosis not present

## 2018-02-13 DIAGNOSIS — C7951 Secondary malignant neoplasm of bone: Secondary | ICD-10-CM | POA: Diagnosis not present

## 2018-02-13 DIAGNOSIS — E785 Hyperlipidemia, unspecified: Secondary | ICD-10-CM | POA: Diagnosis not present

## 2018-02-13 DIAGNOSIS — H409 Unspecified glaucoma: Secondary | ICD-10-CM | POA: Diagnosis not present

## 2018-02-13 LAB — BASIC METABOLIC PANEL
ANION GAP: 6 (ref 3–11)
BUN: 13 mg/dL (ref 7–26)
CHLORIDE: 110 mmol/L — AB (ref 98–109)
CO2: 25 mmol/L (ref 22–29)
Calcium: 8.6 mg/dL (ref 8.4–10.4)
Creatinine, Ser: 0.8 mg/dL (ref 0.70–1.30)
Glucose, Bld: 102 mg/dL (ref 70–140)
POTASSIUM: 4 mmol/L (ref 3.5–5.1)
SODIUM: 141 mmol/L (ref 136–145)

## 2018-02-13 LAB — CBC WITH DIFFERENTIAL (CANCER CENTER ONLY)
BASOS ABS: 0.1 10*3/uL (ref 0.0–0.1)
Basophils Relative: 2 %
Eosinophils Absolute: 0.1 10*3/uL (ref 0.0–0.5)
Eosinophils Relative: 3 %
HCT: 41.7 % (ref 38.4–49.9)
Hemoglobin: 14.5 g/dL (ref 13.0–17.1)
LYMPHS ABS: 1.3 10*3/uL (ref 0.9–3.3)
LYMPHS PCT: 33 %
MCH: 31.6 pg (ref 27.2–33.4)
MCHC: 34.8 g/dL (ref 32.0–36.0)
MCV: 90.8 fL (ref 79.3–98.0)
Monocytes Absolute: 0.6 10*3/uL (ref 0.1–0.9)
Monocytes Relative: 15 %
NEUTROS ABS: 1.8 10*3/uL (ref 1.5–6.5)
Neutrophils Relative %: 47 %
PLATELETS: 176 10*3/uL (ref 140–400)
RBC: 4.59 MIL/uL (ref 4.20–5.82)
RDW: 14.2 % (ref 11.0–14.6)
WBC: 3.8 10*3/uL — AB (ref 4.0–10.3)

## 2018-02-13 LAB — RETICULOCYTES
RBC.: 4.59 MIL/uL (ref 4.20–5.82)
RETIC COUNT ABSOLUTE: 64.3 10*3/uL (ref 34.8–93.9)
Retic Ct Pct: 1.4 % (ref 0.8–1.8)

## 2018-02-13 MED ORDER — LENALIDOMIDE 15 MG PO CAPS: 15.0000 mg | ORAL_CAPSULE | Freq: Every day | ORAL | 0 refills | Status: DC

## 2018-02-26 ENCOUNTER — Telehealth: Payer: Self-pay | Admitting: Hematology

## 2018-02-26 NOTE — Telephone Encounter (Signed)
Faxed medical records to [facility] @ [phone]. Release ND#[id number]

## 2018-02-27 DIAGNOSIS — R490 Dysphonia: Secondary | ICD-10-CM | POA: Diagnosis not present

## 2018-03-08 DIAGNOSIS — R490 Dysphonia: Secondary | ICD-10-CM | POA: Diagnosis not present

## 2018-03-08 DIAGNOSIS — C9002 Multiple myeloma in relapse: Secondary | ICD-10-CM | POA: Diagnosis not present

## 2018-03-08 DIAGNOSIS — J343 Hypertrophy of nasal turbinates: Secondary | ICD-10-CM | POA: Diagnosis not present

## 2018-03-09 ENCOUNTER — Other Ambulatory Visit: Payer: Self-pay | Admitting: *Deleted

## 2018-03-09 MED ORDER — LENALIDOMIDE 15 MG PO CAPS: 15.0000 mg | ORAL_CAPSULE | Freq: Every day | ORAL | 0 refills | Status: DC

## 2018-03-13 DIAGNOSIS — H1013 Acute atopic conjunctivitis, bilateral: Secondary | ICD-10-CM | POA: Diagnosis not present

## 2018-03-15 ENCOUNTER — Inpatient Hospital Stay: Payer: Medicare Other | Attending: Hematology

## 2018-03-15 DIAGNOSIS — C9002 Multiple myeloma in relapse: Secondary | ICD-10-CM | POA: Diagnosis not present

## 2018-03-15 LAB — CMP (CANCER CENTER ONLY)
ALT: 25 U/L (ref 0–55)
ANION GAP: 8 (ref 3–11)
AST: 19 U/L (ref 5–34)
Albumin: 3.8 g/dL (ref 3.5–5.0)
Alkaline Phosphatase: 61 U/L (ref 40–150)
BILIRUBIN TOTAL: 1.3 mg/dL — AB (ref 0.2–1.2)
BUN: 11 mg/dL (ref 7–26)
CO2: 22 mmol/L (ref 22–29)
Calcium: 8.4 mg/dL (ref 8.4–10.4)
Chloride: 110 mmol/L — ABNORMAL HIGH (ref 98–109)
Creatinine: 0.8 mg/dL (ref 0.70–1.30)
Glucose, Bld: 123 mg/dL (ref 70–140)
POTASSIUM: 3.6 mmol/L (ref 3.5–5.1)
Sodium: 140 mmol/L (ref 136–145)
TOTAL PROTEIN: 6.5 g/dL (ref 6.4–8.3)

## 2018-03-15 LAB — CBC WITH DIFFERENTIAL (CANCER CENTER ONLY)
Basophils Absolute: 0.1 10*3/uL (ref 0.0–0.1)
Basophils Relative: 2 %
EOS ABS: 0.2 10*3/uL (ref 0.0–0.5)
EOS PCT: 6 %
HCT: 41.2 % (ref 38.4–49.9)
Hemoglobin: 14.1 g/dL (ref 13.0–17.1)
LYMPHS ABS: 1 10*3/uL (ref 0.9–3.3)
LYMPHS PCT: 35 %
MCH: 31.2 pg (ref 27.2–33.4)
MCHC: 34.2 g/dL (ref 32.0–36.0)
MCV: 91.2 fL (ref 79.3–98.0)
Monocytes Absolute: 0.4 10*3/uL (ref 0.1–0.9)
Monocytes Relative: 14 %
Neutro Abs: 1.2 10*3/uL — ABNORMAL LOW (ref 1.5–6.5)
Neutrophils Relative %: 43 %
PLATELETS: 152 10*3/uL (ref 140–400)
RBC: 4.52 MIL/uL (ref 4.20–5.82)
RDW: 14.7 % — ABNORMAL HIGH (ref 11.0–14.6)
WBC: 2.8 10*3/uL — AB (ref 4.0–10.3)

## 2018-03-15 LAB — RETICULOCYTES
RBC.: 4.52 MIL/uL (ref 4.20–5.82)
RETIC CT PCT: 1.5 % (ref 0.8–1.8)
Retic Count, Absolute: 67.8 10*3/uL (ref 34.8–93.9)

## 2018-03-16 LAB — KAPPA/LAMBDA LIGHT CHAINS
KAPPA FREE LGHT CHN: 19.7 mg/L — AB (ref 3.3–19.4)
Kappa, lambda light chain ratio: 2.03 — ABNORMAL HIGH (ref 0.26–1.65)
LAMDA FREE LIGHT CHAINS: 9.7 mg/L (ref 5.7–26.3)

## 2018-03-19 LAB — MULTIPLE MYELOMA PANEL, SERUM
ALPHA2 GLOB SERPL ELPH-MCNC: 0.5 g/dL (ref 0.4–1.0)
Albumin SerPl Elph-Mcnc: 3.7 g/dL (ref 2.9–4.4)
Albumin/Glob SerPl: 1.5 (ref 0.7–1.7)
Alpha 1: 0.2 g/dL (ref 0.0–0.4)
B-GLOBULIN SERPL ELPH-MCNC: 0.9 g/dL (ref 0.7–1.3)
GAMMA GLOB SERPL ELPH-MCNC: 0.9 g/dL (ref 0.4–1.8)
GLOBULIN, TOTAL: 2.5 g/dL (ref 2.2–3.9)
IGG (IMMUNOGLOBIN G), SERUM: 991 mg/dL (ref 700–1600)
IgA: 121 mg/dL (ref 61–437)
IgM (Immunoglobulin M), Srm: 32 mg/dL (ref 20–172)
M PROTEIN SERPL ELPH-MCNC: 0.2 g/dL — AB
Total Protein ELP: 6.2 g/dL (ref 6.0–8.5)

## 2018-03-20 NOTE — Progress Notes (Signed)
[name]    HEMATOLOGY/ONCOLOGY CLINIC NOTE  Date of Service: 03/22/18   Patient Care Team: [name], MD as PCP - General (Family Medicine) [name] MD ([facility] _ primary oncology) phone number [phone] Email- [email or web address]  CHIEF COMPLAINTS  followup of multiple myeloma.  HISTORY OF PRESENTING ILLNESS:   Michael [name] is a wonderful 67 y.o. male , retired [employer] who has been referred to [place] by Dr .Sabra Heck, [name], MD for establishing local oncology care "In case needed for treatment/management of complications pertaining to myeloma"  Patient has a history of multiple myeloma and is currently being followed actively managed at [facility] by Dr.[name] MD who is his primary oncologist. Patient is currently on a study protocol and is being monitored for myeloma recurrence.  Based on available records being put together  -Patient was diagnosed with IgG kappa ISS stage I multiple myeloma with hyperdiploid complex cytogenetics in 2012. Bone marrow biopsy apparently showed 50% kappa restricted plasma cells with an initial M spike of 3.4 g/dL with evidence of lytic lesions on his PET/CT scan including right rib fracture and lesions of the lumbar and cervical spine. -Patient was treated under protocol 11-C-0221 with from 02/21/2013 with  Carfilzomib/Revlimid/Dexamethasone for a total of 8 cycles. He reports that his stem cells were collected and stored after 4 cycles -Posttreatment bone marrow biopsy showed less than 5% plasma cells with a negative flow cytometry and improvement in his previously PET avid lesions. Some concern for new focus of activity at C2 lytic lesion at L4 and several small lesions throughout the vertebrae. -Patient was on maintenance lenalidomide 10 mg by mouth daily for 2 years or more until end of 2014. 08/19/2013 - bone marrow biopsy showed normocellular marrow with less than 5% plasma cells. PET CT scan showed decreased focal metabolic  ligament prior rib lesions and no new lesions. Flow cytometry was negative for residual disease. Serum and urine electrophoresis and IFE showed no evidence of monoclonal protein. -Patient notes that he was off protocol for a few years due to lack of clinical trial research funding. -Patient notes that he is back on a follow-up protocol and continues to follow and receive his primary treatment at [facility] at this time. -Patient reports he had his last PET CT scan blood and urine tests and bone marrow examination in February 2018. The results of which are not available to [place] currently.  He notes that there was concern for a very slowly growing sternal FDG avid lesion that has been present for years. He has a follow-up at [facility] next month to determine the management of this lesion. He reports that was some consideration of considering radiation. he notes that this lesion has not been biopsied. No other focal bone pains at this time.  We discussed in detail about what our role will be and noted that we shall be available for any acute issues that need to be taken care of locally but that at this time the primary treatment and evaluation will be driven by his team at [facility] as per their research protocols and input. The patient and his wife are clearly aware of this and are in agreement with this plan.  INTERVAL HISTORY  [name] comes is here for continued management of his MM. The patient's last visit with [place] was on 01/25/18. The pt reports that he is doing well overall.   The pt reports that in April he developed some throat hoarseness. He saw ENT Dr Melida Quitter and  is being worked up with an impending [place]. He denies acid reflux during the day, but may note some during the night.   He also saw an opthamologist who recommended he return to his allegra and Protonix medications.   He notes that on 03/15/18 he was at the end of his Revlimid cycle. He has been on 78m Revlimid for 3 months. [facility]  [facility]   Lab results (03/15/18) of CBC, CMP, and Reticulocytes is as follows: all values are WNL except for Chloride at 110, Total Bilirubin at 1.3, WBC at 2.8k, RDW at 14.7, ANC at 1.2k. MMP 03/15/18 shows all values WNL except for M Protein at 0.2.  Kappa/Lambda 03/15/18 shows Kappa elevated at 19.7 and K:L ratio elevated at 2.03.  On review of systems, pt reports throat hoarseness, lower back pain, and denies acid reflux, hand swelling, tongue swelling, abdominal pains, leg swelling, and any other symptoms.   MEDICAL HISTORY:  Past Medical History:  Diagnosis Date  . Angioedema   . Gastroesophageal reflux disease without esophagitis   . Hyperlipidemia   . [place] myeloma [place]   [place] [facility] . Non morbid obesity due to excess calories   . Prediabetes   . Prediabetes   . Unspecified glaucoma(365.9)   . Urticaria, unspecified   Diabetes mellitus Glaucoma Left toes neuropathy  GERD  SURGICAL HISTORY: Past Surgical History:  Procedure Laterality Date  . PORTACATH PLACEMENT      SOCIAL HISTORY: Social History   Socioeconomic History  . Marital status: Married    Spouse name: Not on file  . Number of children: 2  . Years of education: Not on file  . Highest education level: Not on file  Occupational History  . 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 Financial resource strain: Not on file  . Food insecurity:    Worry: Not on file    Inability: Not on file  . Transportation needs:    Medical: Not on file    Non-medical: Not on file  Tobacco Use  . Smoking status: Never Smoker  . Smokeless tobacco: Never Used  Substance and Sexual Activity  . Alcohol use: Yes    Alcohol/week: 0.0 oz    Comment: Occasional beer  . Drug use: No  . Sexual activity: Not on file  Lifestyle  . Physical activity:    Days per week: Not on file    Minutes per session: Not on file  . Stress: Not on file  Relationships  .  Social connections:    Talks on phone: Not on file    Gets together: Not on file    Attends religious service: Not on file    Active member of club or organization: Not on file    Attends meetings of clubs or organizations: Not on file    Relationship status: Not on file  . Intimate partner violence:    Fear of current or ex partner: Not on file    Emotionally abused: Not on file    Physically abused: Not on file    Forced sexual activity: Not on file  Other Topics Concern  . Not on file  Social History Narrative  . Not on file  Never smoker Married [place] from the [place] [facility] [place] [facility]  FAMILY HISTORY: Family History  Problem Relation Age of Onset  . Cancer Father  ALLERGIES:  has No Known Allergies.  MEDICATIONS:  Current Outpatient Medications  Medication Sig Dispense Refill  . aspirin 81 MG tablet Take 81 mg by mouth daily.    . Calcium Carbonate-Vitamin D (CALCIUM-D) 600-400 MG-UNIT TABS Take by mouth 2 (two) times daily.    . fluticasone (FLONASE) 50 MCG/ACT nasal spray Place 1 spray into both nostrils daily.  0  . lenalidomide (REVLIMID) 15 MG capsule Take 1 capsule (15 mg total) by mouth daily. Take days 1-21 every 28 days. Auth #[id number] 21 capsule 0  . mupirocin ointment (BACTROBAN) 2 % 3 (three) times daily.    . pantoprazole (PROTONIX) 40 MG tablet Take 40 mg by mouth as needed.     . ranitidine (ZANTAC) 150 MG capsule Take 1 capsule by mouth at bedtime.  0  . atorvastatin (LIPITOR) 20 MG tablet Take 20 mg by mouth daily.    . naproxen (NAPROSYN) 500 MG tablet Take 500 mg by mouth as needed.     . vitamin B-12 (CYANOCOBALAMIN) 1000 MCG tablet Take 1,000 mcg by mouth daily.     No current facility-administered medications for this visit.     REVIEW OF SYSTEMS:   A 10+ POINT REVIEW OF SYSTEMS WAS OBTAINED including neurology, dermatology, psychiatry, cardiac, respiratory, lymph, extremities, GI, GU, Musculoskeletal,  constitutional, breasts, reproductive, HEENT.  All pertinent positives are noted in the HPI.  All others are negative.    PHYSICAL EXAMINATION: ECOG PERFORMANCE STATUS: 1 - Symptomatic but completely ambulatory  Vitals:   03/22/18 0953  BP: 131/71  Pulse: 64  Resp: 18  Temp: 97.9 F (36.6 C)  TempSrc: Oral  SpO2: 99%  Weight: 246 lb 9.6 oz (111.9 kg)  Height: 6' 1"  (1.854 m)   Body mass index is 32.53 kg/m.   GENERAL:alert, in no acute distress and comfortable SKIN: no acute rashes, no significant lesions EYES: conjunctiva are pink and non-injected, sclera anicteric OROPHARYNX: MMM, no exudates, no oropharyngeal erythema or ulceration NECK: supple, no JVD LYMPH:  no palpable lymphadenopathy in the cervical, axillary or inguinal regions LUNGS: clear to auscultation b/l with normal respiratory effort HEART: regular rate & rhythm ABDOMEN:  normoactive bowel sounds , non tender, not distended. Extremity: no pedal edema PSYCH: alert & oriented x 3 with fluent speech NEURO: no focal motor/sensory deficits   LABORATORY DATA:  I have reviewed the data as listed  . CBC Latest Ref Rng & Units 03/15/2018 02/13/2018 01/19/2018  WBC 4.0 - 10.3 K/uL 2.8(L) 3.8(L) 2.9(L)  Hemoglobin 13.0 - 17.1 g/dL 14.1 14.5 14.6  Hematocrit 38.4 - 49.9 % 41.2 41.7 43.1  Platelets 140 - 400 K/uL 152 176 165  ANC 1200  . CMP Latest Ref Rng & Units 03/15/2018 02/13/2018 01/19/2018  Glucose 70 - 140 mg/dL 123 102 122  BUN 7 - 26 mg/dL 11 13 13   Creatinine 0.70 - 1.30 mg/dL 0.80 0.80 0.77  Sodium 136 - 145 mmol/L 140 141 140  Potassium 3.5 - 5.1 mmol/L 3.6 4.0 3.6  Chloride 98 - 109 mmol/L 110(H) 110(H) 108  CO2 22 - 29 mmol/L 22 25 24   Calcium 8.4 - 10.4 mg/dL 8.4 8.6 9.1  Total Protein 6.4 - 8.3 g/dL 6.5 - 6.8  Total Bilirubin 0.2 - 1.2 mg/dL 1.3(H) - 0.9  Alkaline Phos 40 - 150 U/L 61 - 64  AST 5 - 34 U/L 19 - 19  ALT 0 - 55 U/L 25 - 31   Component     Latest Ref Rng &  Units 05/23/2017 07/25/2017  10/03/2017 11/24/2017  IgG (Immunoglobin G), Serum     700 - 1,600 mg/dL 942 934 1,026 1,068  IgA     61 - 437 mg/dL    121  IgM (Immunoglobulin M), Srm     20 - 172 mg/dL    30  Total Protein ELP     6.0 - 8.5 g/dL    6.8  Albumin SerPl Elph-Mcnc     2.9 - 4.4 g/dL 4.0 3.6 4.1 4.0  Alpha 1     0.0 - 0.4 g/dL 0.2 0.2 0.2 0.2  Alpha2 Glob SerPl Elph-Mcnc     0.4 - 1.0 g/dL 0.6 0.7 0.6 0.6  B-Globulin SerPl Elph-Mcnc     0.7 - 1.3 g/dL 1.0 1.1 1.0 1.0  Gamma Glob SerPl Elph-Mcnc     0.4 - 1.8 g/dL 1.0 1.2 1.1 0.9  M Protein SerPl Elph-Mcnc     Not Observed g/dL 0.3 (H) 0.3 (H) 0.4 (H) 0.7 (H)  Globulin, Total     2.2 - 3.9 g/dL 2.9 3.1 2.9 2.8  Albumin/Glob SerPl     0.7 - 1.7 1.4 1.2 1.5 1.5  IFE 1      Comment Comment Comment Comment  Please Note (HCV):      Comment Comment Comment Comment   Component     Latest Ref Rng & Units 01/19/2018 03/15/2018  IgG (Immunoglobin G), Serum     700 - 1,600 mg/dL 1,035 991  IgA     61 - 437 mg/dL 122 121  IgM (Immunoglobulin M), Srm     20 - 172 mg/dL 24 32  Total Protein ELP     6.0 - 8.5 g/dL 6.5 6.2  Albumin SerPl Elph-Mcnc     2.9 - 4.4 g/dL 3.7 3.7  Alpha 1     0.0 - 0.4 g/dL 0.2 0.2  Alpha2 Glob SerPl Elph-Mcnc     0.4 - 1.0 g/dL 0.6 0.5  B-Globulin SerPl Elph-Mcnc     0.7 - 1.3 g/dL 1.1 0.9  Gamma Glob SerPl Elph-Mcnc     0.4 - 1.8 g/dL 1.0 0.9  M Protein SerPl Elph-Mcnc     Not Observed g/dL 0.5 (H) 0.2 (H)  Globulin, Total     2.2 - 3.9 g/dL 2.8 2.5  Albumin/Glob SerPl     0.7 - 1.7 1.4 1.5  IFE 1      Comment Comment  Please Note (HCV):      Comment Comment       RADIOGRAPHIC STUDIES: I have personally reviewed the radiological images as listed and agreed with the findings in the report. No results found.  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 Bone marrow biopsy apparently showed 50% kappa restricted plasma cells with an initial M spike of 3.4 g/dL with evidence of lytic lesions on his PET/CT scan including right rib fracture and lesions of the lumbar and cervical spine. -Patient was treated under protocol 11-C-0221 with from 02/21/2013 with  Carfilzomib/Revlimid/Dexamethasone for a total of 8 cycles. He reports that his stem cells were collected and stored after 4 cycles -Posttreatment bone marrow biopsy showed less than 5% plasma cells with a negative flow cytometry and improvement in his previously PET avid lesions. Some concern for new focus of activity at C2 lytic lesion at L4 and  several small lesions throughout the vertebrae. -Patient was on maintenance lenalidomide 10 mg by mouth daily for 2 years or more until end of 2014. 08/19/2013 - bone marrow biopsy showed normocellular marrow with less than 5% plasma cells. PET CT scan showed decreased focal metabolic ligament prior rib lesions and no new lesions. Flow cytometry was negative for residual disease. Serum and urine electrophoresis and IFE showed no evidence of monoclonal protein. -Patient notes that he was off protocol for a few years due to lack of clinical trial research funding. -Patient notes that he is back on a follow-up protocol and continues to follow and receive his primary treatment at [facility] at this time.  -Patient's labs show minimal IgG kappa protein on IFE and a CT chest in May 2018 that did not show any overt signs of myeloma progression in the bones.  Rpt Myeloma labs 03/2017 - M spike of 0.3g/dl with a repeat M spike stable at 0.3 g/dL on 05/23/2017 which remains stable on labs today 07/25/17 Serum kappa lambda free light chain ratio not significantly changed. PET/CT scan showed a single metabolically active sternal lesion without any overt bony destruction. Bone marrow biopsy done on 06/06/2017 - shows no overt involvement with multiple myeloma.  2) Elevated  bilirubin level . Repeat labs today show normalization of his bilirubin levels after switching to a three-week on one-week off regimen with the Revlimid.  Plan -no new bone pains or other focal symptoms. -no prohibitive toxicities from Zometa or Revlimid maintenance at this time. -continue Revlimid dose at 24m po daily --3weeks on 1 week off. -Discussed pt labwork today; ANC is borderline low at 1.2k;  blood counts and chemistries are otherwise normal; M protein levels has decreased from 0.7 to 0.5 today. Liver functions stabilized.  -If AGraettingercontinues to drop on next labs, we will evaluate Revlimid dosing again. -[place]  [facility] [facility] [place]  -Discussed pt labwork from, 03/15/18; M Protein down to 0.2, mild neutropenia with ANC at 1.2k  -Will continue to watch WBCs with repeat labs in 4 weeks -Will continue 128mRevlimid for now 3weeks on 1 week off -Recommend steam inhalation twice a day for throat hoarseness, and continue follow up with ENT Dr BaRedmond BasemanWill see pt back in 2 months with labs 1 week prior  3) PET/CT abnormality in the Prostate. PSA levels WNL -Primary care physician to consider urology referral  4) Abnormal focus of FDG avid at a spot in the liver - will need to be monitor on f/u scans. No pain or other symptoms at this time. -rpt [place] in the next couple of months   Labs in 4 weeks RTC with Dr KaIrene Limbon 8 with labs (1 week prior to clinic visit)   All of the patients questions were answered with apparent satisfaction. The patient knows to call the clinic with any problems, questions or concerns.  The toal time spent in the appt was 25 minutes and more than 50% was on counseling and direct patient cares.    GaSullivan LoneD MSBig SandyAHIVMS SCNew York Eye And Ear InfirmaryTDartmouth Hitchcock Ambulatory Surgery Centerematology/Oncology Physician CoLindustries LLC [facility](Office):       [phone] cell):  [phone]):           [phone], ScBaldwin Jamaicaam acting as a scribe for Dr KaIrene Limbo  .I have reviewed the above  documentation for accuracy and completeness, and I agree with the above. .GBrunetta GeneraD

## 2018-03-21 DIAGNOSIS — C9000 Multiple myeloma not having achieved remission: Secondary | ICD-10-CM | POA: Diagnosis not present

## 2018-03-21 DIAGNOSIS — R490 Dysphonia: Secondary | ICD-10-CM | POA: Diagnosis not present

## 2018-03-21 DIAGNOSIS — Z79899 Other long term (current) drug therapy: Secondary | ICD-10-CM | POA: Diagnosis not present

## 2018-03-21 DIAGNOSIS — R7303 Prediabetes: Secondary | ICD-10-CM | POA: Diagnosis not present

## 2018-03-21 DIAGNOSIS — Z6832 Body mass index (BMI) 32.0-32.9, adult: Secondary | ICD-10-CM | POA: Diagnosis not present

## 2018-03-21 DIAGNOSIS — E6609 Other obesity due to excess calories: Secondary | ICD-10-CM | POA: Diagnosis not present

## 2018-03-21 DIAGNOSIS — K219 Gastro-esophageal reflux disease without esophagitis: Secondary | ICD-10-CM | POA: Diagnosis not present

## 2018-03-21 DIAGNOSIS — E7800 Pure hypercholesterolemia, unspecified: Secondary | ICD-10-CM | POA: Diagnosis not present

## 2018-03-22 ENCOUNTER — Telehealth: Payer: Self-pay | Admitting: Hematology

## 2018-03-22 ENCOUNTER — Encounter: Payer: Self-pay | Admitting: Hematology

## 2018-03-22 ENCOUNTER — Inpatient Hospital Stay (HOSPITAL_BASED_OUTPATIENT_CLINIC_OR_DEPARTMENT_OTHER): Payer: Medicare Other | Admitting: Hematology

## 2018-03-22 ENCOUNTER — Inpatient Hospital Stay: Payer: Medicare Other | Attending: Hematology

## 2018-03-22 VITALS — BP 131/71 | HR 64 | Temp 97.9°F | Resp 18 | Ht 73.0 in | Wt 246.6 lb

## 2018-03-22 DIAGNOSIS — C9000 Multiple myeloma not having achieved remission: Secondary | ICD-10-CM | POA: Diagnosis not present

## 2018-03-22 DIAGNOSIS — R490 Dysphonia: Secondary | ICD-10-CM | POA: Diagnosis not present

## 2018-03-22 DIAGNOSIS — R9389 Abnormal findings on diagnostic imaging of other specified body structures: Secondary | ICD-10-CM | POA: Diagnosis not present

## 2018-03-22 DIAGNOSIS — R933 Abnormal findings on diagnostic imaging of other parts of digestive tract: Secondary | ICD-10-CM | POA: Insufficient documentation

## 2018-03-22 DIAGNOSIS — D709 Neutropenia, unspecified: Secondary | ICD-10-CM | POA: Diagnosis not present

## 2018-03-22 DIAGNOSIS — C9002 Multiple myeloma in relapse: Secondary | ICD-10-CM | POA: Insufficient documentation

## 2018-03-22 MED ORDER — SODIUM CHLORIDE 0.9 % IV SOLN: Freq: Once | INTRAVENOUS | Status: DC

## 2018-03-22 MED ORDER — ZOLEDRONIC ACID 4 MG/100ML IV SOLN
4.0000 mg | Freq: Once | INTRAVENOUS | Status: AC
  Administered 2018-03-22: 4 mg via INTRAVENOUS
  Filled 2018-03-22: qty 100

## 2018-03-22 NOTE — Telephone Encounter (Signed)
Scheduled appt per 6/6 los - gave patient aVS and calender per los.  

## 2018-03-22 NOTE — Patient Instructions (Signed)

## 2018-03-26 ENCOUNTER — Telehealth: Payer: Self-pay | Admitting: Hematology

## 2018-03-26 NOTE — Telephone Encounter (Signed)
R/s end of July appt per [facility] (staff message ) left message with appt date and time.

## 2018-04-04 DIAGNOSIS — H401131 Primary open-angle glaucoma, bilateral, mild stage: Secondary | ICD-10-CM | POA: Diagnosis not present

## 2018-04-04 DIAGNOSIS — H1013 Acute atopic conjunctivitis, bilateral: Secondary | ICD-10-CM | POA: Diagnosis not present

## 2018-04-11 ENCOUNTER — Other Ambulatory Visit: Payer: Self-pay | Admitting: Otolaryngology

## 2018-04-11 DIAGNOSIS — R490 Dysphonia: Secondary | ICD-10-CM

## 2018-04-13 ENCOUNTER — Other Ambulatory Visit: Payer: Self-pay

## 2018-04-13 MED ORDER — LENALIDOMIDE 15 MG PO CAPS: 15.0000 mg | ORAL_CAPSULE | Freq: Every day | ORAL | 0 refills | Status: DC

## 2018-04-23 ENCOUNTER — Inpatient Hospital Stay: Payer: Medicare Other | Attending: Hematology

## 2018-04-23 DIAGNOSIS — C9002 Multiple myeloma in relapse: Secondary | ICD-10-CM | POA: Diagnosis not present

## 2018-04-23 DIAGNOSIS — C9000 Multiple myeloma not having achieved remission: Secondary | ICD-10-CM

## 2018-04-23 LAB — CMP (CANCER CENTER ONLY)
ALBUMIN: 4.2 g/dL (ref 3.5–5.0)
ALT: 29 U/L (ref 0–44)
AST: 22 U/L (ref 15–41)
Alkaline Phosphatase: 58 U/L (ref 38–126)
Anion gap: 8 (ref 5–15)
BUN: 14 mg/dL (ref 8–23)
CHLORIDE: 104 mmol/L (ref 98–111)
CO2: 29 mmol/L (ref 22–32)
Calcium: 9.6 mg/dL (ref 8.9–10.3)
Creatinine: 0.87 mg/dL (ref 0.61–1.24)
GFR, Est AFR Am: >60 mL/min (ref >60)
GFR, Estimated: >60 mL/min (ref >60)
GLUCOSE: 113 mg/dL — AB (ref 70–99)
POTASSIUM: 3.7 mmol/L (ref 3.5–5.1)
Sodium: 141 mmol/L (ref 135–145)
Total Bilirubin: 1.3 mg/dL — ABNORMAL HIGH (ref 0.3–1.2)
Total Protein: 7 g/dL (ref 6.5–8.1)

## 2018-04-23 LAB — CBC WITH DIFFERENTIAL/PLATELET
Basophils Absolute: 0.1 10*3/uL (ref 0.0–0.1)
Basophils Relative: 4 %
EOS ABS: 0.1 10*3/uL (ref 0.0–0.5)
Eosinophils Relative: 5 %
HCT: 42.1 % (ref 38.4–49.9)
Hemoglobin: 14.3 g/dL (ref 13.0–17.1)
LYMPHS ABS: 0.8 10*3/uL — AB (ref 0.9–3.3)
LYMPHS PCT: 33 %
MCH: 31.4 pg (ref 27.2–33.4)
MCHC: 34.1 g/dL (ref 32.0–36.0)
MCV: 92.3 fL (ref 79.3–98.0)
Monocytes Absolute: 0.2 10*3/uL (ref 0.1–0.9)
Monocytes Relative: 9 %
Neutro Abs: 1.2 10*3/uL — ABNORMAL LOW (ref 1.5–6.5)
Neutrophils Relative %: 49 %
Platelets: 174 10*3/uL (ref 140–400)
RBC: 4.56 MIL/uL (ref 4.20–5.82)
RDW: 14.3 % (ref 11.0–14.6)
WBC: 2.5 10*3/uL — AB (ref 4.0–10.3)

## 2018-04-23 LAB — RETICULOCYTES
RBC.: 4.6 MIL/uL (ref 4.20–5.82)
Retic Count, Absolute: 59.8 10*3/uL (ref 34.8–93.9)
Retic Ct Pct: 1.3 % (ref 0.8–1.8)

## 2018-04-24 ENCOUNTER — Ambulatory Visit
Admission: RE | Admit: 2018-04-24 | Discharge: 2018-04-24 | Disposition: A | Discharge status: Home / Self Care | Payer: Medicare Other | Source: Ambulatory Visit | Attending: Otolaryngology | Admitting: Otolaryngology

## 2018-04-24 DIAGNOSIS — R490 Dysphonia: Secondary | ICD-10-CM

## 2018-04-24 DIAGNOSIS — R221 Localized swelling, mass and lump, neck: Secondary | ICD-10-CM | POA: Diagnosis not present

## 2018-04-24 DIAGNOSIS — C9000 Multiple myeloma not having achieved remission: Secondary | ICD-10-CM | POA: Diagnosis not present

## 2018-04-24 LAB — MULTIPLE MYELOMA PANEL, SERUM
ALBUMIN/GLOB SERPL: 1.4 (ref 0.7–1.7)
ALPHA 1: 0.2 g/dL (ref 0.0–0.4)
Albumin SerPl Elph-Mcnc: 3.8 g/dL (ref 2.9–4.4)
Alpha2 Glob SerPl Elph-Mcnc: 0.6 g/dL (ref 0.4–1.0)
B-GLOBULIN SERPL ELPH-MCNC: 0.9 g/dL (ref 0.7–1.3)
GLOBULIN, TOTAL: 2.8 g/dL (ref 2.2–3.9)
Gamma Glob SerPl Elph-Mcnc: 1 g/dL (ref 0.4–1.8)
IGG (IMMUNOGLOBIN G), SERUM: 1060 mg/dL (ref 700–1600)
IgA: 128 mg/dL (ref 61–437)
IgM (Immunoglobulin M), Srm: 26 mg/dL (ref 20–172)
M Protein SerPl Elph-Mcnc: 0.4 g/dL — ABNORMAL HIGH
Total Protein ELP: 6.6 g/dL (ref 6.0–8.5)

## 2018-04-24 MED ORDER — IOPAMIDOL (ISOVUE-300) INJECTION 61%
75.0000 mL | Freq: Once | INTRAVENOUS | Status: AC | PRN
  Administered 2018-04-24: 75 mL via INTRAVENOUS

## 2018-04-27 ENCOUNTER — Encounter: Payer: Self-pay | Admitting: Hematology

## 2018-05-01 ENCOUNTER — Other Ambulatory Visit (HOSPITAL_COMMUNITY): Payer: Self-pay | Admitting: Otolaryngology

## 2018-05-01 DIAGNOSIS — R221 Localized swelling, mass and lump, neck: Secondary | ICD-10-CM

## 2018-05-07 ENCOUNTER — Other Ambulatory Visit: Payer: Self-pay | Admitting: Radiology

## 2018-05-08 ENCOUNTER — Ambulatory Visit (HOSPITAL_COMMUNITY)
Admission: RE | Admit: 2018-05-08 | Discharge: 2018-05-08 | Disposition: A | Discharge status: Home / Self Care | Payer: Medicare Other | Source: Ambulatory Visit | Attending: Otolaryngology | Admitting: Otolaryngology

## 2018-05-08 ENCOUNTER — Other Ambulatory Visit: Payer: Medicare Other

## 2018-05-08 ENCOUNTER — Encounter (HOSPITAL_COMMUNITY): Payer: Self-pay

## 2018-05-08 DIAGNOSIS — R7303 Prediabetes: Secondary | ICD-10-CM | POA: Diagnosis not present

## 2018-05-08 DIAGNOSIS — H409 Unspecified glaucoma: Secondary | ICD-10-CM | POA: Insufficient documentation

## 2018-05-08 DIAGNOSIS — R490 Dysphonia: Secondary | ICD-10-CM | POA: Insufficient documentation

## 2018-05-08 DIAGNOSIS — Z79899 Other long term (current) drug therapy: Secondary | ICD-10-CM | POA: Insufficient documentation

## 2018-05-08 DIAGNOSIS — K219 Gastro-esophageal reflux disease without esophagitis: Secondary | ICD-10-CM | POA: Insufficient documentation

## 2018-05-08 DIAGNOSIS — R221 Localized swelling, mass and lump, neck: Secondary | ICD-10-CM | POA: Diagnosis not present

## 2018-05-08 DIAGNOSIS — D491 Neoplasm of unspecified behavior of respiratory system: Secondary | ICD-10-CM | POA: Diagnosis not present

## 2018-05-08 DIAGNOSIS — Z7982 Long term (current) use of aspirin: Secondary | ICD-10-CM | POA: Insufficient documentation

## 2018-05-08 DIAGNOSIS — C323 Malignant neoplasm of laryngeal cartilage: Secondary | ICD-10-CM | POA: Insufficient documentation

## 2018-05-08 DIAGNOSIS — C9000 Multiple myeloma not having achieved remission: Secondary | ICD-10-CM | POA: Diagnosis not present

## 2018-05-08 DIAGNOSIS — E785 Hyperlipidemia, unspecified: Secondary | ICD-10-CM | POA: Diagnosis not present

## 2018-05-08 LAB — PROTIME-INR
INR: 1.04
Prothrombin Time: 13.6 seconds (ref 11.4–15.2)

## 2018-05-08 LAB — CBC
HCT: 42.7 % (ref 39.0–52.0)
Hemoglobin: 14.9 g/dL (ref 13.0–17.0)
MCH: 31.8 pg (ref 26.0–34.0)
MCHC: 34.9 g/dL (ref 30.0–36.0)
MCV: 91 fL (ref 78.0–100.0)
PLATELETS: 128 10*3/uL — AB (ref 150–400)
RBC: 4.69 MIL/uL (ref 4.22–5.81)
RDW: 14.3 % (ref 11.5–15.5)
WBC: 3.4 10*3/uL — AB (ref 4.0–10.5)

## 2018-05-08 LAB — APTT: APTT: 26 s (ref 24–36)

## 2018-05-08 MED ORDER — FENTANYL CITRATE (PF) 100 MCG/2ML IJ SOLN
INTRAMUSCULAR | Status: AC | PRN
  Administered 2018-05-08 (×2): 50 ug via INTRAVENOUS

## 2018-05-08 MED ORDER — MIDAZOLAM HCL 2 MG/2ML IJ SOLN
INTRAMUSCULAR | Status: AC
  Filled 2018-05-08: qty 4

## 2018-05-08 MED ORDER — MIDAZOLAM HCL 2 MG/2ML IJ SOLN
INTRAMUSCULAR | Status: AC | PRN
  Administered 2018-05-08 (×2): 1 mg via INTRAVENOUS

## 2018-05-08 MED ORDER — LIDOCAINE HCL 1 % IJ SOLN
INTRAMUSCULAR | Status: AC
  Filled 2018-05-08: qty 20

## 2018-05-08 MED ORDER — FENTANYL CITRATE (PF) 100 MCG/2ML IJ SOLN
INTRAMUSCULAR | Status: AC
  Filled 2018-05-08: qty 4

## 2018-05-08 MED ORDER — SODIUM CHLORIDE 0.9 % IV SOLN
INTRAVENOUS | Status: DC
  Administered 2018-05-08: 11:00:00 via INTRAVENOUS

## 2018-05-08 NOTE — H&P (Signed)
Chief Complaint: Patient was seen in consultation today for biopsy of neck mass at the request of [name],Michael  Referring Physician(s): [name],Michael  Supervising Physician: Michael [name]  Patient Status: Michael [name]  History of Present Illness: Michael [name] is a 67 y.o. male with hx of myeloma. He developed some hoarseness and CT of the neck finds a left neck mass. He is referred for biopsy PMHx, meds, labs, imaging, allergies reviewed. Feels well, no recent fevers, chills, illness. Has been NPO today as directed. Family at bedside.   Past Medical History:  Diagnosis Date  . Angioedema   . Gastroesophageal reflux disease without esophagitis   . Hyperlipidemia   . Multiple myeloma ([place]) 2012   Treated at [facility]  . Non morbid obesity due to excess calories   . Prediabetes   . Prediabetes   . Unspecified glaucoma(365.9)   . Urticaria, unspecified     Past Surgical History:  Procedure Laterality Date  . PORTACATH PLACEMENT      Allergies: Patient has no known allergies.  Medications: Prior to Admission medications   Medication Sig Start Date End Date Taking? Authorizing Provider  aspirin 81 MG tablet Take 81 mg by mouth daily.   Yes [provider]  atorvastatin (LIPITOR) 20 MG tablet Take 20 mg by mouth daily.   Yes [provider]  Calcium Carbonate-Vitamin D (CALCIUM-D) 600-400 MG-UNIT TABS Take by mouth 2 (two) times daily.   Yes [provider]  lenalidomide (REVLIMID) 15 MG capsule Take 1 capsule (15 mg total) by mouth daily. Take days 1-21 every 28 days. Josem Kaufmann #[id number] 04/13/2018 04/13/18  Yes [name], MD  naproxen (NAPROSYN) 500 MG tablet Take 500 mg by mouth as needed.    Yes [name], MD  pantoprazole (PROTONIX) 40 MG tablet Take 40 mg by mouth as needed.    Yes [provider]  ranitidine (ZANTAC) 150 MG capsule Take 1 capsule by mouth at bedtime. 01/18/18  Yes [provider]  vitamin B-12  (CYANOCOBALAMIN) 1000 MCG tablet Take 1,000 mcg by mouth daily.   Yes [provider]  fluticasone (FLONASE) 50 MCG/ACT nasal spray Place 1 spray into both nostrils daily. 01/18/18   [provider]  mupirocin ointment (BACTROBAN) 2 % 3 (three) times daily. 12/25/17   [provider]     Family History  Problem Relation Age of Onset  . Cancer Father     Social History   Socioeconomic History  . Marital status: Married    Spouse name: Not on file  . Number of children: 2  . Years of education: Not on file  . Highest education level: Not on file  Occupational History  . Occupation: Retired Chief Strategy Officer for [employer]  . Financial resource strain: Not on file  . Food insecurity:    Worry: Not on file    Inability: Not on file  . Transportation needs:    Medical: Not on file    Non-medical: Not on file  Tobacco Use  . Smoking status: Never Smoker  . Smokeless tobacco: Never Used  Substance and Sexual Activity  . Alcohol use: Yes    Alcohol/week: 0.0 oz    Comment: Occasional beer  . Drug use: No  . Sexual activity: Not on file  Lifestyle  . Physical activity:    Days per week: Not on file    Minutes per session: Not on file  . Stress: Not on file  Relationships  .  Social connections:    Talks on phone: Not on file    Gets together: Not on file    Attends religious service: Not on file    Active member of club or organization: Not on file    Attends meetings of clubs or organizations: Not on file    Relationship status: Not on file  Other Topics Concern  . Not on file  Social History Narrative  . Not on file    Review of Systems: A 12 point ROS discussed and pertinent positives are indicated in the HPI above.  All other systems are negative.  Review of Systems  Vital Signs: Ht 6' (1.829 m)   Wt 240 lb (108.9 kg)   BMI 32.55 kg/m   Physical Exam  Constitutional: He is oriented to person, place, and time. He appears  well-developed. No distress.  HENT:  Head: Normocephalic.  Mouth/Throat: Oropharynx is clear and moist.  Neck: Normal range of motion. No JVD present.  Cardiovascular: Normal rate, regular rhythm and normal heart sounds.  Pulmonary/Chest: Effort normal and breath sounds normal. No respiratory distress.  Neurological: He is alert and oriented to person, place, and time.  Skin: Skin is warm and dry.  Psychiatric: He has a normal mood and affect. Judgment normal.    Imaging: Ct Soft Tissue Neck W Contrast  Result Date: 04/24/2018 CLINICAL DATA:  67 year old male with multiple myeloma. Hoarseness since April. EXAM: CT NECK WITH CONTRAST TECHNIQUE: Multidetector CT imaging of the neck was performed using the standard protocol following the bolus administration of intravenous contrast. CONTRAST:  57m ISOVUE-300 IOPAMIDOL (ISOVUE-300) INJECTION 61% COMPARISON:  PET-CT 05/23/2017 FINDINGS: Pharynx and larynx: In August 2018 the larynx was normal. The subglottic larynx remains normal, but there is a rounded intermediate density left laryngeal soft tissue mass encompassing 25 x 28 by 31 millimeters (AP by transverse by CC) which seems to have arisen from the left thyroid cartilage, and centripetally displaces residual thyroid cartilage calcification. See series 6 images 64-68, coronal image 41, sagittal image 76). The mass effect scratched at the mass tracks into and medially displaces the left false cord, although a small fat plane remains visible with the cord. The mass abuts the left arytenoid which appears to remain intact. The mass extends into the left strap muscle with an indistinct margin. The anterior commissure and right supraglottic larynx are spared. The hypopharynx remains normal. The oropharynx is effaced at the soft palate. The nasopharynx appears stable and normal. The superior parapharyngeal spaces are normal. The retropharyngeal space is normal. Salivary glands: Negative sublingual space,  submandibular glands, parotid glands. Thyroid: Negative. Lymph nodes: Despite the left supraglottic laryngeal mass, lymph nodes throughout the bilateral neck remain stable and within normal limits. There is a small but conspicuous rounded left level 1 B lymph node seen on series 6, image 48, but this is stable and was hypometabolic in [address] Vascular: The major vascular structures in the neck and at the skull base are patent, including the left IJ and thyroidal region veins. Limited intracranial: Negative. Visualized orbits: Negative. Mastoids and visualized paranasal sinuses: Visible paranasal sinuses and mastoids are stable and well pneumatized. Skeleton: Widespread lucent and lytic lesions in the skeleton compatible with multiple myeloma, including the clivus, numerous spinal vertebrae, sternum, medial clavicles, posterior ribs, and possibly also the angle of the right mandible. Upper chest: Negative visible lung parenchyma. No superior mediastinal lymphadenopathy. Mild Calcified aortic atherosclerosis. Other: There is a small superficial probable sebaceous cyst along the posterior right  neck seen on series 6, image 61 which is chronic and not significantly changed since 2018. IMPRESSION: 1. Left laryngeal 3 cm soft tissue mass appears to have originated within and is expanding the left thyroid cartilage. This is new since the August 2018 PET-CT, and there is absent lymphadenopathy. 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 Primary cartilaginous neoplasm, squamous cell carcinoma, and other differential considerations are less likely. 2. Superimposed widespread multiple myeloma lesions in the visible skeleton. Electronically Signed   By: Genevie Ann M.D.   On: 04/24/2018 13:53    Labs:  CBC: Recent Labs    02/13/18 0740 03/15/18 0733 04/23/18 0743 05/08/18 1059  WBC 3.8* 2.8* 2.5* 3.4*  HGB 14.5 14.1 14.3 14.9  HCT 41.7 41.2 42.1 42.7  PLT 176 152 174 128*     COAGS: Recent Labs    06/06/17 0920 05/08/18 1059  INR 1.05 1.04  APTT 26 26    BMP: Recent Labs    01/19/18 0815 02/13/18 0740 03/15/18 0733 04/23/18 0743  NA 140 141 140 141  K 3.6 4.0 3.6 3.7  CL 108 110* 110* 104  CO2 _0 GLUCOSE 122 102 123 113*  BUN _1 CALCIUM 9.1 8.6 8.4 9.6  CREATININE 0.77 0.80 0.80 0.87  GFRNONAA >60 >60 >60 >60  GFRAA >60 >60 >60 >60    LIVER FUNCTION TESTS: Recent Labs    11/24/17 1118 01/19/18 0815 03/15/18 0733 04/23/18 0743  BILITOT 0.9 0.9 1.3* 1.3*  AST _2 ALT _3 ALKPHOS 70 64 61 58  PROT 6.8 6.8 6.5 7.0  ALBUMIN 4.0 3.9 3.8 4.2    TUMOR MARKERS: No results for input(s): AFPTM, CEA, CA199, CHROMGRNA in the last 8760 hours.  Assessment and Plan: (L)neck mass For US guided biopsy Labs ok Risks and benefits discussed with the patient including, but not limited to bleeding, infection, damage to adjacent structures or low yield requiring additional tests.  All of the patient's questions were answered, patient is agreeable to proceed. Consent signed and in chart.    Thank you for this interesting consult.  I greatly enjoyed meeting [facility] and look forward to participating in their care.  A copy of this report was sent to the requesting provider on this date.  Electronically Signed: Ascencion Dike, PA-C 05/08/2018, 12:24 PM   I spent a total of 20 minutes in face to face in clinical consultation, greater than 50% of which was counseling/coordinating care for left neck mass biopsy

## 2018-05-08 NOTE — Procedures (Signed)
Myeloma  S/p left neck thyroid cartilage mass core bx   No comp Stable EBL min Path pending Full report in pacs

## 2018-05-08 NOTE — Discharge Instructions (Signed)

## 2018-05-14 DIAGNOSIS — L570 Actinic keratosis: Secondary | ICD-10-CM | POA: Diagnosis not present

## 2018-05-14 DIAGNOSIS — L723 Sebaceous cyst: Secondary | ICD-10-CM | POA: Diagnosis not present

## 2018-05-15 ENCOUNTER — Encounter: Payer: Self-pay | Admitting: Hematology

## 2018-05-15 ENCOUNTER — Ambulatory Visit: Payer: Medicare Other | Admitting: Hematology

## 2018-05-16 ENCOUNTER — Telehealth: Payer: Self-pay

## 2018-05-17 ENCOUNTER — Telehealth: Payer: Self-pay | Admitting: Hematology

## 2018-05-17 NOTE — Progress Notes (Signed)
[name]    HEMATOLOGY/ONCOLOGY CLINIC NOTE  Date of Service: 05/18/18     Patient Care Team: [name], MD as PCP - General (Family Medicine) [name] MD ([facility] _ primary oncology) phone number [phone] Email- [email or web address]  CHIEF COMPLAINTS  followup of multiple myeloma.  HISTORY OF PRESENTING ILLNESS:   Michael [name] is a wonderful 67 y.o. 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 Patient is currently on a study protocol and is being monitored for myeloma recurrence.  Based on available records being put together  -Patient was diagnosed with IgG kappa ISS stage I multiple myeloma with hyperdiploid complex cytogenetics in 2012. Bone marrow biopsy apparently showed 50% kappa restricted plasma cells with an initial M spike of 3.4 g/dL with evidence of lytic lesions on his PET/CT scan including right rib fracture and lesions of the lumbar and cervical spine. -Patient was treated under protocol 11-C-0221 with from 02/21/2013 with  Carfilzomib/Revlimid/Dexamethasone for a total of 8 cycles. He reports that his stem cells were collected and stored after 4 cycles -Posttreatment bone marrow biopsy showed less than 5% plasma cells with a negative flow cytometry and improvement in his previously PET avid lesions. Some concern for new focus of activity at C2 lytic lesion at L4 and several small lesions throughout the vertebrae. -Patient was on maintenance lenalidomide 10 mg by mouth daily for 2 years or more until end of 2014. 08/19/2013 - bone marrow biopsy showed normocellular marrow with less than 5% plasma cells. PET CT scan showed decreased focal metabolic  ligament prior rib lesions and no new lesions. Flow cytometry was negative for residual disease. Serum and urine electrophoresis and IFE showed no evidence of monoclonal protein. -Patient notes that he was off protocol for a few years due to lack of clinical trial research funding. -Patient notes that he is back on a follow-up protocol and continues to follow and receive his primary treatment at [facility] at this time. -Patient reports he had his last PET CT scan blood and urine tests and bone marrow examination in February 2018. The results of which are not available to [place] currently.  He notes that there was concern for a very slowly growing sternal FDG avid lesion that has been present for years. He has a follow-up at [facility] next month to determine the management of this lesion. He reports that was some consideration of considering radiation. he notes that this lesion has not been biopsied. No other focal bone pains at this time.  We discussed in detail about what our role will be and noted that we shall be available for any acute issues that need to be taken care of locally but that at this time the primary treatment and evaluation will be driven by his team at [facility] as per their research protocols and input. The patient and his wife are clearly aware of this and are in agreement with this plan.  INTERVAL HISTORY  Michael [name] comes is here for continued management of his Multiple Myeloma. The patient's last visit with [place] was on 03/22/18. He is accompanied today by his wife. The pt reports that he is doing well overall.   The pt reports that his voice continues to  be softer than his baseline, which began in early April. He saw ENT Dr. Melida Quitter for his throat concern which was worked up with CT Soft tissue Neck on 04/24/18, as noted below, and a biopsy on 05/08/18 also as noted below.   He notes that his previously removed cyst, from 2014, had flared recently and was lanced and drained with  his dermatologist yesterday. He has also been receiving treatment for his skin cancers with his dermatologist.   Of note since the patient's last visit, pt has had CT Soft tissue Neck completed on 04/24/18 with results revealing Left laryngeal 3 cm soft tissue mass appears to have originated within and is expanding the left thyroid cartilage. This is new since the August 2018 PET-CT, and there is absent lymphadenopathy. 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 Primary cartilaginous neoplasm, squamous cell carcinoma, and other differential considerations are less likely. Superimposed widespread multiple myeloma lesions in the visible skeleton.  Lab results today (05/18/18) of CBC w/diff and Reticulocytes is as follows: all values are WNL except for WBC at 3.6k.  On review of systems, pt reports voice weakness, eating well, good energy levels, and denies problems breathing, problems swallowing, new bone pains, throat pain or soreness, neck pain, pain along the spine, abdominal pains, changes in bowel habits, difficulty urinating, and any other symptoms.    MEDICAL HISTORY:  Past Medical History:  Diagnosis Date  . Angioedema   . Gastroesophageal reflux disease without esophagitis   . Hyperlipidemia   . Multiple myeloma ([place]) 2012   Treated at [facility]  . Non morbid obesity due to excess calories   . Prediabetes   . Prediabetes   . Unspecified glaucoma(365.9)   . Urticaria, unspecified   Diabetes mellitus Glaucoma Left toes neuropathy  GERD  SURGICAL HISTORY: Past Surgical History:  Procedure Laterality Date  . PORTACATH PLACEMENT      SOCIAL HISTORY: Social History   Socioeconomic History  . Marital status: Married    Spouse name: Not on file  . Number of children: 2  . Years of education: Not on file  . Highest education level: Not on file  Occupational History  . Occupation: Retired Chief Strategy Officer for [employer]  . Financial  resource strain: Not on file  . Food insecurity:    Worry: Not on file    Inability: Not on file  . Transportation needs:    Medical: Not on file    Non-medical: Not on file  Tobacco Use  . Smoking status: Never Smoker  . Smokeless tobacco: Never Used  Substance and Sexual Activity  . Alcohol use: Yes    Alcohol/week: 0.0 oz    Comment: Occasional beer  . Drug use: No  . Sexual activity: Not on file  Lifestyle  . Physical activity:    Days per week: Not on file    Minutes per session: Not on file  . Stress: Not on file  Relationships  . Social connections:    Talks on phone: Not on file    Gets together: Not on file    Attends religious service: Not on file    Active member of club or organization: Not on file    Attends meetings of clubs or organizations: Not on file    Relationship status: Not on file  . Intimate partner violence:    Fear of current or ex partner: Not on file    Emotionally abused: Not on file  Physically abused: Not on file    Forced sexual activity: Not on file  Other Topics Concern  . Not on file  Social History Narrative  . Not on file  Never smoker Married Retired from the [employer] in June 2017 and moved to [facility] [place] [facility].  FAMILY HISTORY: Family History  Problem Relation Age of Onset  . Cancer Father     ALLERGIES:  has No Known Allergies.  MEDICATIONS:  Current Outpatient Medications  Medication Sig Dispense Refill  . aspirin 81 MG tablet Take 81 mg by mouth daily.    [name] atorvastatin (LIPITOR) 20 MG tablet Take 20 mg by mouth daily.    . Calcium Carbonate-Vitamin D (CALCIUM-D) 600-400 MG-UNIT TABS Take by mouth 2 (two) times daily.    . fluticasone (FLONASE) 50 MCG/ACT nasal spray Place 1 spray into both nostrils daily.  0  . lenalidomide (REVLIMID) 15 MG capsule Take 1 capsule (15 mg total) by mouth daily. Take days 1-21 every 28 days. Josem Kaufmann #[id number] 04/13/2018 21 capsule 0  . mupirocin ointment (BACTROBAN) 2 % 3  (three) times daily.    . naproxen (NAPROSYN) 500 MG tablet Take 500 mg by mouth as needed.     . pantoprazole (PROTONIX) 40 MG tablet Take 40 mg by mouth as needed.     . ranitidine (ZANTAC) 150 MG capsule Take 1 capsule by mouth at bedtime.  0  . vitamin B-12 (CYANOCOBALAMIN) 1000 MCG tablet Take 1,000 mcg by mouth daily.     No current facility-administered medications for this visit.     REVIEW OF SYSTEMS:   A 10+ POINT REVIEW OF SYSTEMS WAS OBTAINED including neurology, dermatology, psychiatry, cardiac, respiratory, lymph, extremities, GI, GU, Musculoskeletal, constitutional, breasts, reproductive, HEENT.  All pertinent positives are noted in the HPI.  All others are negative.   PHYSICAL EXAMINATION: ECOG PERFORMANCE STATUS: 1 - Symptomatic but completely ambulatory  Vitals:   05/18/18 1220  BP: 132/75  Pulse: 62  Resp: 18  Temp: 97.7 F (36.5 C)  TempSrc: Oral  SpO2: 100%  Weight: 244 lb 4.8 oz (110.8 kg)  Height: 6' (1.829 m)   Body mass index is 33.13 kg/m.   GENERAL:alert, in no acute distress and comfortable SKIN: no acute rashes, no significant lesions EYES: conjunctiva are pink and non-injected, sclera anicteric OROPHARYNX: MMM, no exudates, no oropharyngeal erythema or ulceration NECK: supple, no JVD LYMPH:  no palpable lymphadenopathy in the cervical, axillary or inguinal regions LUNGS: clear to auscultation b/l with normal respiratory effort HEART: regular rate & rhythm ABDOMEN:  normoactive bowel sounds , non tender, not distended. No palpable hepatosplenomegaly.  Extremity: no pedal edema PSYCH: alert & oriented x 3 with fluent speech NEURO: no focal motor/sensory deficits   LABORATORY DATA:  I have reviewed the data as listed  . CBC Latest Ref Rng & Units 05/18/2018 05/08/2018 04/23/2018  WBC 4.0 - 10.3 K/uL 3.6(L) 3.4(L) 2.5(L)  Hemoglobin 13.0 - 17.1 g/dL 13.7 14.9 14.3  Hematocrit 38.4 - 49.9 % 40.2 42.7 42.1  Platelets 140 - 400 K/uL 150 128(L) 174    ANC 1200  . CMP Latest Ref Rng & Units 04/23/2018 03/15/2018 02/13/2018  Glucose 70 - 99 mg/dL 113(H) 123 102  BUN 8 - 23 mg/dL _0 Creatinine 0.61 - 1.24 mg/dL 0.87 0.80 0.80  Sodium 135 - 145 mmol/L 141 140 141  Potassium 3.5 - 5.1 mmol/L 3.7 3.6 4.0  Chloride 98 - 111 mmol/L 104 110(H) 110(H)  CO2  22 - 32 mmol/L _0 Calcium 8.9 - 10.3 mg/dL 9.6 8.4 8.6  Total Protein 6.5 - 8.1 g/dL 7.0 6.5 -  Total Bilirubin 0.3 - 1.2 mg/dL 1.3(H) 1.3(H) -  Alkaline Phos 38 - 126 U/L 58 61 -  AST 15 - 41 U/L 22 19 -  ALT 0 - 44 U/L 29 25 -   Component     Latest Ref Rng & Units 05/23/2017 07/25/2017 10/03/2017 11/24/2017  IgG (Immunoglobin G), Serum     700 - 1,600 mg/dL 942 934 1,026 1,068  IgA     61 - 437 mg/dL    121  IgM (Immunoglobulin M), Srm     20 - 172 mg/dL    30  Total Protein ELP     6.0 - 8.5 g/dL    6.8  Albumin SerPl Elph-Mcnc     2.9 - 4.4 g/dL 4.0 3.6 4.1 4.0  Alpha 1     0.0 - 0.4 g/dL 0.2 0.2 0.2 0.2  Alpha2 Glob SerPl Elph-Mcnc     0.4 - 1.0 g/dL 0.6 0.7 0.6 0.6  B-Globulin SerPl Elph-Mcnc     0.7 - 1.3 g/dL 1.0 1.1 1.0 1.0  Gamma Glob SerPl Elph-Mcnc     0.4 - 1.8 g/dL 1.0 1.2 1.1 0.9  M Protein SerPl Elph-Mcnc     Not Observed g/dL 0.3 (H) 0.3 (H) 0.4 (H) 0.7 (H)  Globulin, Total     2.2 - 3.9 g/dL 2.9 3.1 2.9 2.8  Albumin/Glob SerPl     0.7 - 1.7 1.4 1.2 1.5 1.5  IFE 1      Comment Comment Comment Comment  Please Note (HCV):      Comment Comment Comment Comment   Component     Latest Ref Rng & Units 01/19/2018 03/15/2018  IgG (Immunoglobin G), Serum     700 - 1,600 mg/dL 1,035 991  IgA     61 - 437 mg/dL 122 121  IgM (Immunoglobulin M), Srm     20 - 172 mg/dL 24 32  Total Protein ELP     6.0 - 8.5 g/dL 6.5 6.2  Albumin SerPl Elph-Mcnc     2.9 - 4.4 g/dL 3.7 3.7  Alpha 1     0.0 - 0.4 g/dL 0.2 0.2  Alpha2 Glob SerPl Elph-Mcnc     0.4 - 1.0 g/dL 0.6 0.5  B-Globulin SerPl Elph-Mcnc     0.7 - 1.3 g/dL 1.1 0.9  Gamma Glob SerPl Elph-Mcnc      0.4 - 1.8 g/dL 1.0 0.9  M Protein SerPl Elph-Mcnc     Not Observed g/dL 0.5 (H) 0.2 (H)  Globulin, Total     2.2 - 3.9 g/dL 2.8 2.5  Albumin/Glob SerPl     0.7 - 1.7 1.4 1.5  IFE 1      Comment Comment  Please Note (HCV):      Comment Comment   05/08/18 Left Neck Thyroid Cartilage Biopsy:        RADIOGRAPHIC STUDIES: I have personally reviewed the radiological images as listed and agreed with the findings in the report. Ct Soft Tissue Neck W Contrast  Result Date: 04/24/2018 CLINICAL DATA:  67 year old male with multiple myeloma. Hoarseness since April. EXAM: CT NECK WITH CONTRAST TECHNIQUE: Multidetector CT imaging of the neck was performed using the standard protocol following the bolus administration of intravenous contrast. CONTRAST:  40m ISOVUE-300 IOPAMIDOL (ISOVUE-300) INJECTION 61% COMPARISON:  PET-CT 05/23/2017 FINDINGS: Pharynx and larynx: In  August 2018 the larynx was normal. The subglottic larynx remains normal, but there is a rounded intermediate density left laryngeal soft tissue mass encompassing 25 x 28 by 31 millimeters (AP by transverse by CC) which seems to have arisen from the left thyroid cartilage, and centripetally displaces residual thyroid cartilage calcification. See series 6 images 64-68, coronal image 41, sagittal image 76). The mass effect scratched at the mass tracks into and medially displaces the left false cord, although a small fat plane remains visible with the cord. The mass abuts the left arytenoid which appears to remain intact. The mass extends into the left strap muscle with an indistinct margin. The anterior commissure and right supraglottic larynx are spared. The hypopharynx remains normal. The oropharynx is effaced at the soft palate. The nasopharynx appears stable and normal. The superior parapharyngeal spaces are normal. The retropharyngeal space is normal. Salivary glands: Negative sublingual space, submandibular glands, parotid glands. Thyroid:  Negative. Lymph nodes: Despite the left supraglottic laryngeal mass, lymph nodes throughout the bilateral neck remain stable and within normal limits. There is a small but conspicuous rounded left level 1 B lymph node seen on series 6, image 48, but this is stable and was hypometabolic in 2376. Vascular: The major vascular structures in the neck and at the skull base are patent, including the left IJ and thyroidal region veins. Limited intracranial: Negative. Visualized orbits: Negative. Mastoids and visualized paranasal sinuses: Visible paranasal sinuses and mastoids are stable and well pneumatized. Skeleton: Widespread lucent and lytic lesions in the skeleton compatible with multiple myeloma, including the clivus, numerous spinal vertebrae, sternum, medial clavicles, posterior ribs, and possibly also the angle of the right mandible. Upper chest: Negative visible lung parenchyma. No superior mediastinal lymphadenopathy. Mild Calcified aortic atherosclerosis. Other: There is a small superficial probable sebaceous cyst along the posterior right neck seen on series 6, image 61 which is chronic and not significantly changed since 2018. IMPRESSION: 1. Left laryngeal 3 cm soft tissue mass appears to have originated within and is expanding the left thyroid cartilage. This is new since the August 2018 PET-CT, and there is absent lymphadenopathy. 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 Primary cartilaginous neoplasm, squamous cell carcinoma, and other differential considerations are less likely. 2. Superimposed widespread multiple myeloma lesions in the visible skeleton. Electronically Signed   By: Genevie Ann M.D.   On: 04/24/2018 13:53   [place] Core Biopsy (soft Tissue)  Result Date: 05/08/2018 INDICATION: History of myeloma, left neck thyroid cartilage mass EXAM: ULTRASOUND CORE BIOPSY LEFT LARYNGEAL SOFT TISSUE MASS ORIGINATING FROM THE LEFT THYROID CARTILAGE MEDICATIONS: 1% LIDOCAINE  LOCAL ANESTHESIA/SEDATION: Moderate (conscious) sedation was employed during this procedure. A total of Versed 2.0 mg and Fentanyl 100 mcg was administered intravenously. Moderate Sedation Time: 10 minutes. The patient's level of consciousness and vital signs were monitored continuously by radiology nursing throughout the procedure under my direct supervision. FLUOROSCOPY TIME:  Fluoroscopy Time: NONE. COMPLICATIONS: None immediate. PROCEDURE: Informed written consent was obtained from the patient after a thorough discussion of the procedural risks, benefits and alternatives. All questions were addressed. Maximal Sterile Barrier Technique was utilized including caps, mask, sterile gowns, sterile gloves, sterile drape, hand hygiene and skin antiseptic. A timeout was performed prior to the initiation of the procedure. previous imaging reviewed. preliminary ultrasound performed. the solid hypoechoic mass with microcalcifications from the left thyroid cartilage was localized. overlying skin marked. under sterile conditions and local anesthesia, an 18 gauge core biopsy needle was advanced  to the lesion. 18 gauge biopsy performed 5 times. samples placed in formalin. needle removed. post seizure imaging demonstrates no hemorrhage or hematoma. patient tolerated biopsy well. IMPRESSION: Successful ultrasound left thyroid cartilage/laryngeal mass core biopsies Electronically Signed   By: Jerilynn Mages.  Shick M.D.   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 Bone marrow biopsy apparently showed 50% kappa restricted plasma cells with an initial M spike of 3.4 g/dL with evidence of lytic lesions on his PET/CT scan including right rib fracture and lesions of the lumbar and cervical spine. -Patient was treated under protocol 11-C-0221 with from  02/21/2013 with  Carfilzomib/Revlimid/Dexamethasone for a total of 8 cycles. He reports that his stem cells were collected and stored after 4 cycles -Posttreatment bone marrow biopsy showed less than 5% plasma cells with a negative flow cytometry and improvement in his previously PET avid lesions. Some concern for new focus of activity at C2 lytic lesion at L4 and several small lesions throughout the vertebrae. -Patient was on maintenance lenalidomide 10 mg by mouth daily for 2 years or more until end of 2014. 08/19/2013 - bone marrow biopsy showed normocellular marrow with less than 5% plasma cells. PET CT scan showed decreased focal metabolic ligament prior rib lesions and no new lesions. Flow cytometry was negative for residual disease. Serum and urine electrophoresis and IFE showed no evidence of monoclonal protein. -Patient notes that he was off protocol for a few years due to lack of clinical trial research funding. -Patient notes that he is back on a follow-up protocol and continues to follow and receive his primary treatment at [facility] at this time.  -Patient's labs show minimal IgG kappa protein on IFE and a CT chest in May 2018 that did not show any overt signs of myeloma progression in the bones.  Rpt Myeloma labs 03/2017 - M spike of 0.3g/dl with a repeat M spike stable at 0.3 g/dL on 05/23/2017 which remains stable on labs today 07/25/17 Serum kappa lambda free light chain ratio not significantly changed. PET/CT scan showed a single metabolically active sternal lesion without any overt bony destruction. Bone marrow biopsy done on 06/06/2017 - shows no overt involvement with multiple myeloma.  2) Elevated bilirubin level . Repeat labs today show normalization of his bilirubin levels after switching to a three-week on one-week off regimen with the Revlimid.  Plan:  -no new bone pains or other focal symptoms. -no prohibitive toxicities from Zometa or Revlimid maintenance at this time. -Will  continue 52m Revlimid for now 3weeks on 1 week off -Discussed pt labwork today, 05/18/18; blood counts are stable and normal outside of WBC slightly decreased at 3.6k -Discussed the 04/24/18 CT Soft Tissue Neck which revealed Left laryngeal 3 cm soft tissue mass appears to have originated within and is expanding the left thyroid cartilage. This is new since the August 2018 PET-CT, and there is absent lymphadenopathy. 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 Primary cartilaginous neoplasm, squamous cell carcinoma, and other differential considerations are less likely. Superimposed widespread multiple myeloma lesions in the visible skeleton. -Discussed the 05/08/18 Left neck thyroid cartilage biopsy which revealed Plasma cell neoplasm.  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 PSA levels WNL -Primary care physician to consider urology referral  4) Abnormal focus of FDG avid at a spot in the liver - will need to be monitor on f/u scans. No pain or other symptoms at this time. 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   All of the patients questions were answered with apparent satisfaction. The patient knows to call the clinic with any problems, questions or concerns.  The total time spent in  the appt was 45 minutes and more than 50% was on counseling and direct patient cares.    Sullivan Lone MD MS AAHIVMS [facility] Hematology/Oncology Physician [facility]  (Office):       [phone] (Work cell):  [phone] (Fax):           [phone]  I, Baldwin Jamaica, am acting as a scribe for Dr. Irene Limbo  .I have reviewed the above documentation for accuracy and completeness, and I agree with the above. [name] MD

## 2018-05-17 NOTE — Telephone Encounter (Signed)
Added appt per 8/1 sch msg - attempted the home phone and it disconnected so I left a voicemail on the cell phone.

## 2018-05-18 ENCOUNTER — Inpatient Hospital Stay: Payer: Medicare Other | Attending: Hematology | Admitting: Hematology

## 2018-05-18 ENCOUNTER — Telehealth: Payer: Self-pay

## 2018-05-18 ENCOUNTER — Encounter: Payer: Self-pay | Admitting: Hematology

## 2018-05-18 ENCOUNTER — Inpatient Hospital Stay: Payer: Medicare Other

## 2018-05-18 VITALS — BP 132/75 | HR 62 | Temp 97.7°F | Resp 18 | Ht 72.0 in | Wt 244.3 lb

## 2018-05-18 DIAGNOSIS — R17 Unspecified jaundice: Secondary | ICD-10-CM | POA: Insufficient documentation

## 2018-05-18 DIAGNOSIS — C9030 Solitary plasmacytoma not having achieved remission: Secondary | ICD-10-CM

## 2018-05-18 DIAGNOSIS — R932 Abnormal findings on diagnostic imaging of liver and biliary tract: Secondary | ICD-10-CM | POA: Insufficient documentation

## 2018-05-18 DIAGNOSIS — Z79899 Other long term (current) drug therapy: Secondary | ICD-10-CM | POA: Diagnosis not present

## 2018-05-18 DIAGNOSIS — C9002 Multiple myeloma in relapse: Secondary | ICD-10-CM | POA: Diagnosis not present

## 2018-05-18 DIAGNOSIS — L02212 Cutaneous abscess of back [any part, except buttock]: Secondary | ICD-10-CM | POA: Diagnosis not present

## 2018-05-18 DIAGNOSIS — C9000 Multiple myeloma not having achieved remission: Secondary | ICD-10-CM

## 2018-05-18 DIAGNOSIS — R9389 Abnormal findings on diagnostic imaging of other specified body structures: Secondary | ICD-10-CM | POA: Diagnosis not present

## 2018-05-18 DIAGNOSIS — R69 Illness, unspecified: Secondary | ICD-10-CM | POA: Insufficient documentation

## 2018-05-18 LAB — CBC WITH DIFFERENTIAL/PLATELET
Basophils Absolute: 0.1 10*3/uL (ref 0.0–0.1)
Basophils Relative: 2 %
EOS ABS: 0.1 10*3/uL (ref 0.0–0.5)
Eosinophils Relative: 1 %
HEMATOCRIT: 40.2 % (ref 38.4–49.9)
HEMOGLOBIN: 13.7 g/dL (ref 13.0–17.1)
LYMPHS ABS: 1.2 10*3/uL (ref 0.9–3.3)
Lymphocytes Relative: 33 %
MCH: 31.5 pg (ref 27.2–33.4)
MCHC: 34.1 g/dL (ref 32.0–36.0)
MCV: 92.4 fL (ref 79.3–98.0)
MONO ABS: 0.6 10*3/uL (ref 0.1–0.9)
MONOS PCT: 17 %
NEUTROS ABS: 1.7 10*3/uL (ref 1.5–6.5)
NEUTROS PCT: 47 %
Platelets: 150 10*3/uL (ref 140–400)
RBC: 4.35 MIL/uL (ref 4.20–5.82)
RDW: 14.4 % (ref 11.0–14.6)
WBC: 3.6 10*3/uL — ABNORMAL LOW (ref 4.0–10.3)

## 2018-05-18 LAB — RETICULOCYTES
RBC.: 4.35 MIL/uL (ref 4.20–5.82)
RETIC CT PCT: 1 % (ref 0.8–1.8)
Retic Count, Absolute: 43.5 10*3/uL (ref 34.8–93.9)

## 2018-05-18 NOTE — Progress Notes (Signed)
Head and Neck Cancer Location of Tumor / Histology:  05/08/18 Diagnosis Soft Tissue Needle Core Biopsy, left neck thyroid cartilage mass - PLASMA CELL NEOPLASM, SEE COMMENT.  Patient presented with symptoms of: Dr. Irene Limbo documents on 05/18/18: The pt reports that his voice continues to be softer than his baseline, which began in early April. He saw ENT Dr. Melida Quitter for his throat concern which was worked up with CT Soft tissue Neck on 04/24/18, as noted below, and a biopsy on 05/08/18 also as noted below.   Biopsies of left neck thyroid cartilage mass revealed: plasma cell neoplasm  Nutrition Status Yes No Comments  Weight changes? [x]  []  He has lost about 36 lbs in one year intentionally  Swallowing concerns? []  [x]    PEG? []  [x]     Referrals Yes No Comments  Social Work? []  [x]    Dentistry? []  [x]    Swallowing therapy? []  [x]    Nutrition? []  [x]    Med/Onc? [x]  []  Dr. Irene Limbo, ongoing for treatment of multiple myeloma   Safety Issues Yes No Comments  Prior radiation? []  [x]    Pacemaker/ICD? []  [x]    Possible current pregnancy? []  [x]    Is the patient on methotrexate? []  [x]     Tobacco/Marijuana/Snuff/ETOH use: He has never smoked, he drinks alcohol occasionally.   Past/Anticipated interventions by otolaryngology, if any:  04/26/18 Dr. Redmond Baseman I personally reviewed his neck CT and discussed the results with him suggesting multiple myeloma involvement of the left hemilarynx that was not present on PET-CT imaging in August 2018. He is treated for multiple myeloma by Dr. Irene Limbo and had been in remission, participating in a clinical trial with [facility]. I will discuss the findings with him.   Past/Anticipated interventions by medical oncology, if any:  Dr. Irene Limbo 05/18/18 Plan:  -no new bone pains or other focal symptoms. -no prohibitive toxicities from Zometa or Revlimid maintenance at this time. -continue Revlimid dose at 75m po daily --3weeks on 1 week off. -Will continue 161mRevlimid for now  3weeks on 1 week off -Discussed pt labwork today, 05/18/18; blood counts are stable and normal outside of WBC slightly decreased at 3.6k -Discussed the 04/24/18 CT Soft Tissue Neck which revealed Left laryngeal 3 cm soft tissue mass appears to have originated within and is expanding the left thyroid cartilage. This is new since the August 2018 PET-CT, and there is absent lymphadenopathy. 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 Primary cartilaginous neoplasm, squamous cell carcinoma, and other differential considerations are less likely. Superimposed widespread multiple myeloma lesions in the visible skeleton. -Discussed the 05/08/18 Left neck thyroid cartilage biopsy which revealed Plasma cell neoplasm.  -Will order PET/CT to rule out mets and for accurate staging -Discussed with the pt and his wife that I will refer him to IR -Will discuss this case with my tumor board -[place] referral to PT for strength and balance training- pt denies wanting this at this time -Recommend following up with nutritional therapist BaErnestene KielWill see pt after IR referral and PET/CT  3) PET/CT abnormality in the Prostate. PSA levels WNL -Primary care physician to consider urology referral 4) Abnormal focus of FDG avid at a spot in the liver - will need to be monitor on f/u scans. No pain or other symptoms at this time. 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    Current Complaints / other details:   PET 05/22/18 He is in a case study at [place] regarding his Mulitple Myeloma since 2012.   BP 137/80   Pulse 70   Temp 98.1 F (36.7 C) (Oral)   Resp 18   Ht 6' (1.829 m)   Wt 242 lb (109.8 kg)   SpO2 98%   BMI 32.82 kg/m    Wt  Readings from Last 3 Encounters:  05/29/18 242 lb (109.8 kg)  05/18/18 244 lb 4.8 oz (110.8 kg)  05/08/18 240 lb (108.9 kg)

## 2018-05-18 NOTE — Progress Notes (Signed)
Spoke w/ Dr. Irene Limbo on 8/2, okay to use CMET from 04/23/18 for Zometa infusion on 8/6.   Demetrius Charity, PharmD Oncology Pharmacist Pharmacy Phone: [phone] 05/18/2018

## 2018-05-18 NOTE — Telephone Encounter (Signed)
Per Irene Limbo verbal okay to cancel 8/6 appointments. Per 8/2 los schedule 2wk with Irene Limbo. Printed avs and calender of upcoming appointment.

## 2018-05-21 ENCOUNTER — Encounter: Payer: Self-pay | Admitting: Hematology

## 2018-05-22 ENCOUNTER — Inpatient Hospital Stay: Payer: Medicare Other

## 2018-05-22 ENCOUNTER — Inpatient Hospital Stay: Payer: Medicare Other | Admitting: Hematology

## 2018-05-22 ENCOUNTER — Ambulatory Visit (HOSPITAL_COMMUNITY)
Admission: RE | Admit: 2018-05-22 | Discharge: 2018-05-22 | Disposition: A | Discharge status: Home / Self Care | Payer: Medicare Other | Source: Ambulatory Visit | Attending: Hematology | Admitting: Hematology

## 2018-05-22 ENCOUNTER — Other Ambulatory Visit: Payer: Self-pay | Admitting: Hematology

## 2018-05-22 VITALS — BP 140/80 | HR 62 | Temp 98.7°F | Resp 18

## 2018-05-22 DIAGNOSIS — R17 Unspecified jaundice: Secondary | ICD-10-CM | POA: Diagnosis not present

## 2018-05-22 DIAGNOSIS — C9002 Multiple myeloma in relapse: Secondary | ICD-10-CM | POA: Diagnosis not present

## 2018-05-22 DIAGNOSIS — M899 Disorder of bone, unspecified: Secondary | ICD-10-CM | POA: Diagnosis not present

## 2018-05-22 DIAGNOSIS — C9030 Solitary plasmacytoma not having achieved remission: Secondary | ICD-10-CM | POA: Insufficient documentation

## 2018-05-22 DIAGNOSIS — C9000 Multiple myeloma not having achieved remission: Secondary | ICD-10-CM

## 2018-05-22 DIAGNOSIS — R69 Illness, unspecified: Secondary | ICD-10-CM | POA: Diagnosis not present

## 2018-05-22 DIAGNOSIS — Z79899 Other long term (current) drug therapy: Secondary | ICD-10-CM | POA: Diagnosis not present

## 2018-05-22 DIAGNOSIS — C9001 Multiple myeloma in remission: Secondary | ICD-10-CM | POA: Diagnosis not present

## 2018-05-22 DIAGNOSIS — R932 Abnormal findings on diagnostic imaging of liver and biliary tract: Secondary | ICD-10-CM | POA: Diagnosis not present

## 2018-05-22 DIAGNOSIS — R9389 Abnormal findings on diagnostic imaging of other specified body structures: Secondary | ICD-10-CM | POA: Diagnosis not present

## 2018-05-22 LAB — GLUCOSE, CAPILLARY: GLUCOSE-CAPILLARY: 122 mg/dL — AB (ref 70–99)

## 2018-05-22 MED ORDER — ZOLEDRONIC ACID 4 MG/100ML IV SOLN
4.0000 mg | Freq: Once | INTRAVENOUS | Status: AC
  Administered 2018-05-22: 4 mg via INTRAVENOUS
  Filled 2018-05-22: qty 100

## 2018-05-22 MED ORDER — LENALIDOMIDE 15 MG PO CAPS: 15.0000 mg | ORAL_CAPSULE | Freq: Every day | ORAL | 0 refills | Status: DC

## 2018-05-22 MED ORDER — FLUDEOXYGLUCOSE F - 18 (FDG) INJECTION
12.1000 | Freq: Once | INTRAVENOUS | Status: AC | PRN
  Administered 2018-05-22: 12.1 via INTRAVENOUS

## 2018-05-22 MED ORDER — SODIUM CHLORIDE 0.9 % IV SOLN
INTRAVENOUS | Status: DC
  Administered 2018-05-22: 10:00:00 via INTRAVENOUS
  Filled 2018-05-22: qty 250

## 2018-05-22 NOTE — Progress Notes (Signed)
Nutrition Assessment   Reason for Assessment: Patient identified on Malnutrition Screening report for weight loss   ASSESSMENT:  67 year old male with multiple myeloma being treated at [facility].  Past medical history of morbid obestity, DM, glaucoma, GERD  Met with patient during infusion of zometa today.  Patient reports intentional weight loss.  His wife is currently trying to loose weight to have knee replacement so he has been following diet as well.  Reports appetite is good, not having any nutrition impact symptoms.      Nutrition Focused Physical Exam: deferred   Medications: reviewed   Labs: reviewed   Anthropometrics:   Height: 72 inches Weight: 244 lb Noted 257 lb 12/01/17 BMI: 33   NUTRITION DIAGNOSIS: none at this time   INTERVENTION:  Discussed overall healthy diet including lean proteins, fruits vegetables and whole grains.   Patient to contact RD with questions or concerns  MONITORING, EVALUATION, GOAL: weight trends, intake   Next Visit: as needed, patient to contact  Joli B. Allen, RD, LDN Registered Dietitian [phone] (pager)        

## 2018-05-22 NOTE — Patient Instructions (Signed)

## 2018-05-25 ENCOUNTER — Telehealth: Payer: Self-pay | Admitting: *Deleted

## 2018-05-25 NOTE — Telephone Encounter (Signed)
Oncology Nurse Navigator Documentation  Placed new referral introductory call to patient, LVMM requesting call back.  Rick Kirbi Farrugia, RN, BSN Head & Neck Oncology Nurse Navigator [facility] at [place] [phone]  

## 2018-05-28 ENCOUNTER — Telehealth: Payer: Self-pay | Admitting: *Deleted

## 2018-05-28 NOTE — Telephone Encounter (Signed)
Oncology Nurse Navigator Documentation  Rec'd call-back from RadOnc new referral pt Michael [name].  Introduced myself as the H&N oncology nurse navigator that works with Dr. [name] to whom he has been referred by Dr. Irene Limbo.  He confirmed understanding of referral and tomorrow's 2:30 appt.  I briefly explained my role as his navigator, indicated I would be joining him during his appt.  I explained arrival and RadOnc registration process for appt.  He verbalized understanding of information provided, expressed appreciation.  Gayleen Orem, RN, BSN Head & Neck Oncology Nurse [place] at [place] [phone]

## 2018-05-29 ENCOUNTER — Other Ambulatory Visit: Payer: Self-pay

## 2018-05-29 ENCOUNTER — Ambulatory Visit
Admission: RE | Admit: 2018-05-29 | Discharge: 2018-05-29 | Disposition: A | Discharge status: Home / Self Care | Payer: Medicare Other | Source: Ambulatory Visit | Attending: Radiation Oncology | Admitting: Radiation Oncology

## 2018-05-29 ENCOUNTER — Encounter: Payer: Self-pay | Admitting: Radiation Oncology

## 2018-05-29 ENCOUNTER — Encounter: Payer: Self-pay | Admitting: *Deleted

## 2018-05-29 VITALS — BP 137/80 | HR 70 | Temp 98.1°F | Resp 18 | Ht 72.0 in | Wt 242.0 lb

## 2018-05-29 DIAGNOSIS — C9000 Multiple myeloma not having achieved remission: Secondary | ICD-10-CM | POA: Diagnosis not present

## 2018-05-29 DIAGNOSIS — C9002 Multiple myeloma in relapse: Secondary | ICD-10-CM

## 2018-05-29 DIAGNOSIS — Z510 Encounter for antineoplastic radiation therapy: Secondary | ICD-10-CM | POA: Diagnosis not present

## 2018-05-29 HISTORY — DX: Dependence on other enabling machines and devices: Z99.89

## 2018-05-29 MED ORDER — LARYNGOSCOPY SOLUTION RAD-ONC
15.0000 mL | Freq: Once | TOPICAL | Status: AC
  Administered 2018-05-29: 15 mL via TOPICAL
  Filled 2018-05-29: qty 15

## 2018-05-29 NOTE — Progress Notes (Signed)
Radiation Oncology         [phone] ________________________________  Initial outpatient Consultation  Name: Michael [name] MRN: [id number]  Date: 05/29/2018  DOB: [birth year]  NO:IBBCWU, [name], MD  [name], MD Vicenta Dunning, M.D.([facility]; [phone], mobile)  REFERRING PHYSICIAN: [name], MD  DIAGNOSIS:    ICD-10-CM   1. Multiple myeloma in relapse ([place]) C90.02 laryngocopy solution for Rad-Onc    Fiberoptic laryngoscopy    Multiple Myeloma with involvemen of the Left Thyroid Cartilage   CHIEF COMPLAINT: Here to discuss management of multiple myeloma   HISTORY OF PRESENT ILLNESS::Michael [name] is a 67 y.o. male who is accompanied by his wife. He presented with hoarseness to Dr Redmond Baseman in otolaryngology. Currently the patient is receiving Revlimid and Zometa with Dr. Irene Limbo. He is in a case study at [facility] regarding his multiple myeloma, which was diagnosed about 7 years ago. He follows up with a physician in [place] every 6 months as part of monitoring for this study.  He saw Dr. Redmond Baseman on 03/08/18. A laryngoscopy was performed, showing: left hemilarynx has a slightly more full appearance.  A neck CT scan on 04/24/18 showed: left laryngeal 3 cm soft tissue mass appears to have originated within and is expanding the left thyroid cartilage, this is new since the August 2018 PET-CT  On 05/08/18, a needle core biopsy of a left neck thyroid cartilage mass revealed plasma cell neoplasm.  A PET scan performed on 05/22/18 showed: soft tissue mass centered around the destroyed left thyroid cartilage is hypermetabolic and consistent with known plasmacytoma, no adenopathy in the neck; hypermetabolic 3 cm cutaneous and subcutaneous soft tissue mass involving the posterior upper thorax (consistent with cyst lanced later from his back); persistent and slightly progressive hypermetabolic sternal Lesion; diffuse lytic myelomatous lesions throughout the bony structures but no other  hypermetabolic foci; focus of hypermetabolism in the left aspect of the lower prostate gland, not present on the prior study.   He denies headaches, trouble swallowing, shortness of breath, urinary issues, depression, anxiety.  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 He enjoys yard work. He and his wife are working on losing weight together. His wife reports he's lost about 36 lbs in 8 months intentionally. He was in the [employer] for 22 years.  I reviewed his images and discussed him with his [facility] physician- Vicenta Dunning, M.D.  PREVIOUS RADIATION THERAPY: No  PAST MEDICAL HISTORY:  has a past medical history of Angioedema, CPAP (continuous positive airway pressure) dependence, Gastroesophageal reflux disease without esophagitis, Hyperlipidemia, Multiple myeloma ([place]) (2012), Non morbid obesity due to excess calories, Prediabetes, Prediabetes, Unspecified glaucoma(365.9), and Urticaria, unspecified.    PAST SURGICAL HISTORY: Past Surgical History:  Procedure Laterality Date  . PORTACATH PLACEMENT    . PORTACATH PLACEMENT     removed 02/2016    FAMILY HISTORY: family history includes Cancer in his father.  SOCIAL HISTORY:  reports that he has never smoked. He has never used smokeless tobacco. He reports that he drinks alcohol. He reports that he does not use drugs.  ALLERGIES: Patient has no known allergies.  MEDICATIONS:  Current Outpatient Medications  Medication Sig Dispense Refill  . aspirin 81 MG tablet Take 81 mg by mouth daily.    [name] atorvastatin (LIPITOR) 20 MG tablet Take 20 mg by mouth daily.    . Calcium Carbonate-Vitamin D (CALCIUM-D) 600-400 MG-UNIT TABS Take by mouth 2 (two) times daily.    [name] lenalidomide (REVLIMID)  15 MG capsule Take 1 capsule (15 mg total) by mouth daily. Take days 1-21 every 28 days. Auth #[id number] 05/22/2018 21 capsule 0  . Multiple Vitamin (MULTIVITAMIN) tablet Take 1 tablet by mouth daily.    . mupirocin ointment (BACTROBAN)  2 % 3 (three) times daily.    . naproxen (NAPROSYN) 500 MG tablet Take 500 mg by mouth as needed.     . pantoprazole (PROTONIX) 40 MG tablet Take 40 mg by mouth as needed.     . vitamin B-12 (CYANOCOBALAMIN) 1000 MCG tablet Take 1,000 mcg by mouth daily.    . fluticasone (FLONASE) 50 MCG/ACT nasal spray Place 1 spray into both nostrils daily.  0  . ranitidine (ZANTAC) 150 MG capsule Take 1 capsule by mouth at bedtime.  0   No current facility-administered medications for this encounter.     REVIEW OF SYSTEMS: A 10+ POINT REVIEW OF SYSTEMS WAS OBTAINED including neurology, dermatology, psychiatry, cardiac, respiratory, lymph, extremities, GI, GU, Musculoskeletal, constitutional, breasts, reproductive, HEENT.  All pertinent positives are noted in the HPI.  All others are negative.   PHYSICAL EXAM:  height is 6' (1.829 m) and weight is 242 lb (109.8 kg). His oral temperature is 98.1 F (36.7 C). His blood pressure is 137/80 and his pulse is 70. His respiration is 18 and oxygen saturation is 98%.   General: Alert and oriented, in no acute distress HEENT: Head is normocephalic. Extraocular movements are intact. Oropharynx is clear. Neck: Neck is supple, no palpable cervical or supraclavicular lymphadenopathy. Heart: Regular in rate and rhythm with no murmurs, rubs, or gallops. Chest: Clear to auscultation bilaterally, with no rhonchi, wheezes, or rales. Abdomen: Soft, nontender, nondistended, with no rigidity or guarding. Extremities: No cyanosis or edema. Lymphatics: see Neck Exam Skin: No concerning lesions. Musculoskeletal: symmetric strength and muscle tone throughout. Neurologic: Cranial nerves II through XII are grossly intact. No obvious focalities. Speech is fluent. Coordination is intact. Psychiatric: Judgment and insight are intact. Affect is appropriate.  PROCEDURE NOTE: After obtaining consent and anesthetizing the nasal cavity with topical lidocaine and phenylephrine, the flexible  endoscope was introduced and passed through the nasal cavity. The false vocal cord on the left is moderately swollen and obstructing my view of the true vocal cord. The right false cord and the right true vocal cord seem normal. The mucosa throughout the larynx shows no nodularity or bleeding or lesions. The remainder of the pharynx and the larynx appear normal.    ECOG = 1  0 - Asymptomatic (Fully active, able to carry on all predisease activities without restriction)  1 - Symptomatic but completely ambulatory (Restricted in physically strenuous activity but ambulatory and able to carry out work of a light or sedentary nature. For example, light housework, office work)  2 - Symptomatic, <50% in bed during the day (Ambulatory and capable of all self care but unable to carry out any work activities. Up and about more than 50% of waking hours)  3 - Symptomatic, >50% in bed, but not bedbound (Capable of only limited self-care, confined to bed or chair 50% or more of waking hours)  4 - Bedbound (Completely disabled. Cannot carry on any self-care. Totally confined to bed or chair)  5 - Death   Eustace Pen MM, Creech RH, Tormey DC, et al. [phone]). "Toxicity and response criteria of the [facility]". [place] Oncol. 5 (6): 649-55   LABORATORY DATA:  Lab Results  Component Value Date   WBC 3.6 (  L) 05/18/2018   HGB 13.7 05/18/2018   HCT 40.2 05/18/2018   MCV 92.4 05/18/2018   PLT 150 05/18/2018   CMP     Component Value Date/Time   NA 141 04/23/2018 0743   NA 140 10/03/2017 1330   K 3.7 04/23/2018 0743   K 3.8 10/03/2017 1330   CL 104 04/23/2018 0743   CO2 29 04/23/2018 0743   CO2 27 10/03/2017 1330   GLUCOSE 113 (H) 04/23/2018 0743   GLUCOSE 103 10/03/2017 1330   BUN 14 04/23/2018 0743   BUN 13.7 10/03/2017 1330   CREATININE 0.87 04/23/2018 0743   CREATININE 0.8 10/03/2017 1330   CALCIUM 9.6 04/23/2018 0743   CALCIUM 9.2 10/03/2017 1330   PROT 7.0 04/23/2018  0743   PROT 7.1 10/03/2017 1330   ALBUMIN 4.2 04/23/2018 0743   ALBUMIN 4.1 10/03/2017 1330   AST 22 04/23/2018 0743   AST 22 10/03/2017 1330   ALT 29 04/23/2018 0743   ALT 30 10/03/2017 1330   ALKPHOS 58 04/23/2018 0743   ALKPHOS 79 10/03/2017 1330   BILITOT 1.3 (H) 04/23/2018 0743   BILITOT 1.54 (H) 10/03/2017 1330   GFRNONAA >60 04/23/2018 0743   GFRAA >60 04/23/2018 0743      No results found for: TSH    RADIOGRAPHY: Nm Pet Image Restage (ps) Whole Body  Result Date: 05/22/2018 CLINICAL DATA:  Subsequent treatment strategy for multiple myeloma. EXAM: NUCLEAR MEDICINE PET WHOLE BODY TECHNIQUE: 12.1 mCi F-18 FDG was injected intravenously. Full-ring PET imaging was performed from the skull base to thigh after the radiotracer. CT data was obtained and used for attenuation correction and anatomic localization. Fasting blood glucose: 122 mg/dl COMPARISON:  PET-CT 05/23/2017 and neck CT 04/24/2018 FINDINGS: Mediastinal blood pool activity: SUV max 3.36 HEAD/NECK: Persistent large soft tissue mass centered at the destroyed left thyroid cartilage. This is hypermetabolic with SUV max of 8.01. No neck adenopathy.  The adjacent thyroid gland appears normal. Incidental CT findings: none CHEST: No hypermetabolic mediastinal or hilar nodes. No suspicious pulmonary nodules on the CT scan. No axillary lymphadenopathy. In the posterior upper thorax there is a cutaneous and subcutaneous lesion measuring approximately 3.0 x 2.6 cm. The lesion is hypermetabolic with SUV max of 6.55 and there is some surrounding interstitial changes with slight hypermetabolism and also slight skin thickening. Findings suspicious for cutaneous manifestation of multiple myeloma (plasmacytoma). Recommend correlation with clinical examination and potential biopsy. Incidental CT findings: none ABDOMEN/PELVIS: No abnormal hypermetabolic activity within the liver, pancreas, adrenal glands, or spleen. No hypermetabolic lymph nodes in the  abdomen or pelvis. Focus of hypermetabolism in the left aspect of the lower prostate gland is new since the prior PET-CT and could reflect prostate cancer. SUV max is 6.31. Recommend correlation with physical examination and PSA level. The seminal vesicles appear normal. Incidental CT findings: Gallstones are noted in the gallbladder but no findings for acute cholecystitis. Mild uniform bladder wall thickening. Small bilateral inguinal hernias containing fat. SKELETON: Overall stable appearing lytic myelomatous lesions throughout the axial and appendicular skeleton. Chronic appearing fairly aggressive lytic changes involving the sternum with a defect in the posterior cortex. Vague soft tissue component noted in the marrow space of the lower sternum is hypermetabolic with SUV max of 3.74. This was previously 6.52. No other definite hypermetabolic bone lesions. Incidental CT findings: none EXTREMITIES: No hypermetabolic lesions in the extremities are identified. Incidental CT findings: none IMPRESSION: 1. Soft tissue mass centered around the destroyed left thyroid cartilage is hypermetabolic  and consistent with known plasmacytoma. No adenopathy in the neck. 2. Hypermetabolic 3 cm cutaneous and subcutaneous soft tissue mass involving the posterior upper thorax suspicious for cutaneous manifestation of myeloma (plasmacytoma). 3. Persistent and slightly progressive hypermetabolic sternal lesion. 4. Diffuse lytic myelomatous lesions throughout the bony structures but no other hypermetabolic foci. 5. Focus of hypermetabolism in the left aspect of the lower prostate gland, not present on the prior study. This could be an area of infection/inflammation or potential prostate cancer. Recommend correlation with physical examination and PSA level. Electronically Signed   By: Marijo Sanes M.D.   On: 05/22/2018 11:31   [place] Core Biopsy (soft Tissue)  Result Date: 05/08/2018 INDICATION: History of myeloma, left neck thyroid  cartilage mass EXAM: ULTRASOUND CORE BIOPSY LEFT LARYNGEAL SOFT TISSUE MASS ORIGINATING FROM THE LEFT THYROID CARTILAGE MEDICATIONS: 1% LIDOCAINE LOCAL ANESTHESIA/SEDATION: Moderate (conscious) sedation was employed during this procedure. A total of Versed 2.0 mg and Fentanyl 100 mcg was administered intravenously. Moderate Sedation Time: 10 minutes. The patient's level of consciousness and vital signs were monitored continuously by radiology nursing throughout the procedure under my direct supervision. FLUOROSCOPY TIME:  Fluoroscopy Time: NONE. COMPLICATIONS: None immediate. PROCEDURE: Informed written consent was obtained from the patient after a thorough discussion of the procedural risks, benefits and alternatives. All questions were addressed. Maximal Sterile Barrier Technique was utilized including caps, mask, sterile gowns, sterile gloves, sterile drape, hand hygiene and skin antiseptic. A timeout was performed prior to the initiation of the procedure. previous imaging reviewed. preliminary ultrasound performed. the solid hypoechoic mass with microcalcifications from the left thyroid cartilage was localized. overlying skin marked. under sterile conditions and local anesthesia, an 18 gauge core biopsy needle was advanced to the lesion. 18 gauge biopsy performed 5 times. samples placed in formalin. needle removed. post seizure imaging demonstrates no hemorrhage or hematoma. patient tolerated biopsy well. IMPRESSION: Successful ultrasound left thyroid cartilage/laryngeal mass core biopsies Electronically Signed   By: Jerilynn Mages.  Shick M.D.   On: 05/08/2018 14:58      IMPRESSION/PLAN: Multiple Myeloma  It was a pleasure meeting the patient today. We discussed the risks, benefits, and side effects of palliative radiotherapy. I recommend 2 weeks of focused radiation therapy to the thyroid lesion/larynx to 25Gy total dose. No guarantees of treatment were given. A consent form was signed and placed in the patient's  medical record. The patient is enthusiastic about proceeding with treatment. I look forward to participating in the patient's care.  I spoke with Dr. Sharmaine Base to inform him of our plans. We agreed on treating the lesion adjacent to his larynx is rather urgent and should precede consideration of any trials at the [facility]. The patient may be considered for other clinical trials upon completion of his radiotherapy here - there is a compelling trial with immunotherapy currently open, there.  I recommended the patient get his thyroid levels checked every year after radiotherapy with a TSH.  I stated I would order a baseline today.  He has an appointment with Dr. Irene Limbo on Friday, 06/01/18. CT simulation to be requested the same day, will start RT next week  __________________________________________   [name], MD  This document serves as a record of services personally performed by [name], MD. It was created on his behalf by Wilburn Mylar, a trained medical scribe. The creation of this record is based on the scribe's personal observations and the provider's statements to them. This document has been checked and approved by the attending provider.

## 2018-05-30 ENCOUNTER — Telehealth: Payer: Self-pay | Admitting: *Deleted

## 2018-05-30 ENCOUNTER — Encounter: Payer: Self-pay | Admitting: Radiation Oncology

## 2018-05-30 ENCOUNTER — Other Ambulatory Visit: Payer: Self-pay | Admitting: Radiation Oncology

## 2018-05-30 DIAGNOSIS — R634 Abnormal weight loss: Secondary | ICD-10-CM

## 2018-05-30 NOTE — Progress Notes (Signed)
Oncology Nurse Navigator Documentation  Met with Michael [name] during initial consult with Dr. Squire.  He was accompanied by his wife.  1. Further introduced myself as their Navigator, explained my role as a member of the Care Team.   2. Provided New Patient Information packet, discussed contents:  Contact information for physician(s), myself, other members of the Care Team.  Advance Directive information ([facility] blue pamphlet with LCSW contact info).  They stated did not have ADs, I provided copy of [facility] Advance Directives booklet/forms.  Fall Prevention Patient Safety Plan  Appointment Guideline  Financial Assistance Information sheet  [facility] map with highlight of [facility]  SLP information sheet 3. Provided introductory explanation of radiation treatment including SIM planning and purpose of Aquaplast head and shoulder mask, showed them example.   4. They voiced understand of Dr. Squire's plan for 2 wks RT, coordination of CT SIM appt. 5. Provided a tour of SIM and [place] areas, explained treatment and arrival procedures. 6. I encouraged them to contact me with questions/concerns as treatments/procedures begin.  They verbalized understanding of information provided.    Rick Diehl, RN, BSN Head & Neck Oncology Nurse Navigator [facility] at [place] [phone]  

## 2018-05-30 NOTE — Telephone Encounter (Signed)
Oncology Nurse Navigator Documentation  Spoke with patient's wife, informed her CT [facility] scheduled for this Friday 11:00 following his 9:00 appt with Dr. Irene Limbo, Medical Oncology.  She voiced understanding.  Gayleen Orem, RN, BSN Head & Neck Oncology Nurse [place] at [place] [phone]

## 2018-05-31 ENCOUNTER — Telehealth: Payer: Self-pay | Admitting: *Deleted

## 2018-05-31 NOTE — Progress Notes (Signed)
[name]    HEMATOLOGY/ONCOLOGY CLINIC NOTE  Date of Service: 06/01/18     Patient Care Team: [name], [name] as PCP - General (Family Medicine) [name], [name] as Consulting Physician (Hematology) [name], [name] as Attending Physician (Radiation Oncology) Michael Sauers, RN as Oncology Nurse Navigator (Oncology) [name] ([name] _ primary oncology) phone number [phone] Email- [email or web address]  CHIEF COMPLAINTS  followup of multiple myeloma.  HISTORY OF PRESENTING ILLNESS:   [name] is a wonderful 67 y.o. male , retired [name] who has been referred to [place] by Dr .[name], [name], [name] for establishing local oncology care "In case needed for treatment/management of complications pertaining to myeloma"  Patient has a history of multiple myeloma and is currently being followed actively managed at [name] by [name] who is his primary oncologist. Patient is currently on a study protocol and is being monitored for myeloma recurrence.  Based on available records being put together  -Patient was diagnosed with IgG kappa ISS stage I multiple myeloma with hyperdiploid complex cytogenetics in 2012. Bone marrow biopsy apparently showed 50% kappa restricted plasma cells with an initial M spike of 3.4 g/dL with evidence of lytic lesions on his PET/CT scan including right rib fracture and lesions of the lumbar and cervical spine. -Patient was treated under protocol 11-C-0221 with from 02/21/2013 with  Carfilzomib/Revlimid/Dexamethasone for a total of 8 cycles. He reports that his stem cells were collected and stored after 4 cycles -Posttreatment bone marrow biopsy showed less than 5% plasma cells with a negative flow cytometry and improvement in his previously PET avid lesions. Some concern for new focus of activity at C2 lytic lesion at L4 and several small lesions throughout the vertebrae. -Patient was on maintenance lenalidomide 10 mg by  mouth daily for 2 years or more until end of 2014. 08/19/2013 - bone marrow biopsy showed normocellular marrow with less than 5% plasma cells. PET CT scan showed decreased focal metabolic ligament prior rib lesions and no new lesions. Flow cytometry was negative for residual disease. Serum and urine electrophoresis and IFE showed no evidence of monoclonal protein. -Patient notes that he was off protocol for a few years due to lack of clinical trial research funding. -Patient notes that he is back on a follow-up protocol and continues to follow and receive his primary treatment at [name] at this time. -Patient reports he had his last PET CT scan blood and urine tests and bone marrow examination in February 2018. The results of which are not available to [place] currently.  He notes that there was concern for a very slowly growing sternal FDG avid lesion that has been present for years. He has a follow-up at [name] next month to determine the management of this lesion. He reports that was some consideration of considering radiation. he notes that this lesion has not been biopsied. No other focal bone pains at this time.  We discussed in detail about what our role will be and noted that we shall be available for any acute issues that need to be taken care of locally but that at this time the primary treatment and evaluation will be driven by his team at [name] as per their research protocols and input. The patient and his wife are clearly aware of this and are in agreement with this plan.  INTERVAL HISTORY  [name] comes is here for continued management of his Multiple Myeloma and recently diagnosed Plasmacytoma. The patient's last visit with [place]  was on 05/18/18. He is accompanied today by his wife. The pt reports that he is doing well overall.   The pt reports that he has met with Dr. Isidore [name] and is pursuing radiation as was discussed in my tumor board.   The pt notes that the area on his back,  though to be a cyst, was removed by Dr. Ronnald [name] from [name] and are not sure if this was sent to pathology. The pt notes that this spot is continuing to heal and hasn't grown or changed.   Of note since the patient's last visit, pt has had PET/CT completed on 05/22/18 with results revealing Soft tissue mass centered around the destroyed left thyroid cartilage is hypermetabolic and consistent with known plasmacytoma. No adenopathy in the neck. 2. Hypermetabolic 3 cm cutaneous and subcutaneous soft tissue mass involving the posterior upper thorax suspicious for cutaneous manifestation of myeloma (plasmacytoma). 3. Persistent and slightly progressive hypermetabolic sternal lesion. 4. Diffuse lytic myelomatous lesions throughout the bony structures but no other hypermetabolic foci. 5. Focus of hypermetabolism in the left aspect of the lower prostate gland, not present on the prior study. This could be an area of infection/inflammation or potential prostate cancer. Recommend correlation with physical examination and PSA level.  The pt did not have labs drawn today.   On review of systems, pt reports stable weak voice, well-healing cyst removal site on back, stable energy levels, and denies urinary discomfort, changing urinary habits, pain over the chest/sternum, abdominal pains, new back pains, and any other symptoms.   MEDICAL HISTORY:  Past Medical History:  Diagnosis Date  . Angioedema   . CPAP (continuous positive airway pressure) dependence    uses at night  . Gastroesophageal reflux disease without esophagitis   . Hyperlipidemia   . Multiple myeloma ([place]) 2012   Treated at [facility]  . Non morbid obesity due to excess calories   . Prediabetes   . Prediabetes   . Unspecified glaucoma(365.9)   . Urticaria, unspecified   Diabetes mellitus Glaucoma Left toes neuropathy  GERD  SURGICAL HISTORY: Past Surgical History:  Procedure Laterality Date  . PORTACATH PLACEMENT    . PORTACATH  PLACEMENT     removed 02/2016    SOCIAL HISTORY: Social History   Socioeconomic History  . Marital status: Married    Spouse name: Not on file  . Number of children: 2  . Years of education: Not on file  . Highest education level: Not on file  Occupational History  . Occupation: Retired Chief Strategy Officer for [employer]  . Financial resource strain: Not on file  . Food insecurity:    Worry: Not on file    Inability: Not on file  . Transportation needs:    Medical: Not on file    Non-medical: Not on file  Tobacco Use  . Smoking status: Never Smoker  . Smokeless tobacco: Never Used  Substance and Sexual Activity  . Alcohol use: Yes    Alcohol/week: 0.0 standard drinks    Comment: Occasional beer  . Drug use: No  . Sexual activity: Not on file  Lifestyle  . Physical activity:    Days per week: Not on file    Minutes per session: Not on file  . Stress: Not on file  Relationships  . Social connections:    Talks on phone: Not on file    Gets together: Not on file    Attends religious service: Not on file    Active  member of club or organization: Not on file    Attends meetings of clubs or organizations: Not on file    Relationship status: Not on file  . Intimate partner violence:    Fear of current or ex partner: Not on file    Emotionally abused: Not on file    Physically abused: Not on file    Forced sexual activity: Not on file  Other Topics Concern  . Not on file  Social History Narrative   Unable to [employer] partner violence- partner in room   Never smoker Married Retired from the [employer] in June 2017 and moved to [facility].  FAMILY HISTORY: Family History  Problem Relation Age of Onset  . Cancer Father     ALLERGIES:  has No Known Allergies.  MEDICATIONS:  Current Outpatient Medications  Medication Sig Dispense Refill  . aspirin 81 MG tablet Take 81 mg by mouth daily.    [name] atorvastatin (LIPITOR) 20 MG tablet Take 20  mg by mouth daily.    . Calcium Carbonate-Vitamin D (CALCIUM-D) 600-400 MG-UNIT TABS Take by mouth 2 (two) times daily.    [name] lenalidomide (REVLIMID) 15 MG capsule Take 1 capsule (15 mg total) by mouth daily. Take days 1-21 every 28 days. Auth #[id number] 05/22/2018 21 capsule 0  . Multiple Vitamin (MULTIVITAMIN) tablet Take 1 tablet by mouth daily.    . pantoprazole (PROTONIX) 40 MG tablet Take 40 mg by mouth as needed.     [name] PAZEO 0.7 % SOLN Place 1 drop into both eyes daily.  11   No current facility-administered medications for this visit.     REVIEW OF SYSTEMS:   A 10+ POINT REVIEW OF SYSTEMS WAS OBTAINED including neurology, dermatology, psychiatry, cardiac, respiratory, lymph, extremities, GI, GU, Musculoskeletal, constitutional, breasts, reproductive, HEENT.  All pertinent positives are noted in the HPI.  All others are negative.   PHYSICAL EXAMINATION: ECOG PERFORMANCE STATUS: 1 - Symptomatic but completely ambulatory  Vitals:   06/01/18 0929  BP: 140/68  Pulse: 69  Resp: 18  Temp: 99.1 F (37.3 C)  TempSrc: Oral  SpO2: 99%  Weight: 250 lb 6.4 oz (113.6 kg)  Height: 6' (1.829 m)   Body mass index is 33.96 kg/m.   GENERAL:alert, in no acute distress and comfortable SKIN: no acute rashes, no significant lesions EYES: conjunctiva are pink and non-injected, sclera anicteric OROPHARYNX: MMM, no exudates, no oropharyngeal erythema or ulceration NECK: supple, no JVD LYMPH:  no palpable lymphadenopathy in the cervical, axillary or inguinal regions LUNGS: clear to auscultation b/l with normal respiratory effort HEART: regular rate & rhythm ABDOMEN:  normoactive bowel sounds , non tender, not distended. No palpable hepatosplenomegaly.  Extremity: no pedal edema PSYCH: alert & oriented x 3 with fluent speech NEURO: no focal motor/sensory deficits   LABORATORY DATA:  I have reviewed the data as listed  . CBC Latest Ref Rng & Units 05/18/2018 05/08/2018 04/23/2018  WBC 4.0 - 10.3  K/uL 3.6(L) 3.4(L) 2.5(L)  Hemoglobin 13.0 - 17.1 g/dL 13.7 14.9 14.3  Hematocrit 38.4 - 49.9 % 40.2 42.7 42.1  Platelets 140 - 400 K/uL 150 128(L) 174  ANC 1200  . CMP Latest Ref Rng & Units 04/23/2018 03/15/2018 02/13/2018  Glucose 70 - 99 mg/dL 113(H) 123 102  BUN 8 - 23 mg/dL _0 Creatinine 0.61 - 1.24 mg/dL 0.87 0.80 0.80  Sodium 135 - 145 mmol/L 141 140 141  Potassium 3.5 - 5.1 mmol/L 3.7 3.6  4.0  Chloride 98 - 111 mmol/L 104 110(H) 110(H)  CO2 22 - 32 mmol/L _0 Calcium 8.9 - 10.3 mg/dL 9.6 8.4 8.6  Total Protein 6.5 - 8.1 g/dL 7.0 6.5 -  Total Bilirubin 0.3 - 1.2 mg/dL 1.3(H) 1.3(H) -  Alkaline Phos 38 - 126 U/L 58 61 -  AST 15 - 41 U/L 22 19 -  ALT 0 - 44 U/L 29 25 -   Component     Latest Ref Rng & Units 05/23/2017 07/25/2017 10/03/2017 11/24/2017  IgG (Immunoglobin G), Serum     700 - 1,600 mg/dL 942 934 1,026 1,068  IgA     61 - 437 mg/dL    121  IgM (Immunoglobulin M), Srm     20 - 172 mg/dL    30  Total Protein ELP     6.0 - 8.5 g/dL    6.8  Albumin SerPl Elph-Mcnc     2.9 - 4.4 g/dL 4.0 3.6 4.1 4.0  Alpha 1     0.0 - 0.4 g/dL 0.2 0.2 0.2 0.2  Alpha2 Glob SerPl Elph-Mcnc     0.4 - 1.0 g/dL 0.6 0.7 0.6 0.6  B-Globulin SerPl Elph-Mcnc     0.7 - 1.3 g/dL 1.0 1.1 1.0 1.0  Gamma Glob SerPl Elph-Mcnc     0.4 - 1.8 g/dL 1.0 1.2 1.1 0.9  M Protein SerPl Elph-Mcnc     Not Observed g/dL 0.3 (H) 0.3 (H) 0.4 (H) 0.7 (H)  Globulin, Total     2.2 - 3.9 g/dL 2.9 3.1 2.9 2.8  Albumin/Glob SerPl     0.7 - 1.7 1.4 1.2 1.5 1.5  IFE 1      Comment Comment Comment Comment  Please Note (HCV):      Comment Comment Comment Comment   Component     Latest Ref Rng & Units 01/19/2018 03/15/2018  IgG (Immunoglobin G), Serum     700 - 1,600 mg/dL 1,035 991  IgA     61 - 437 mg/dL 122 121  IgM (Immunoglobulin M), Srm     20 - 172 mg/dL 24 32  Total Protein ELP     6.0 - 8.5 g/dL 6.5 6.2  Albumin SerPl Elph-Mcnc     2.9 - 4.4 g/dL 3.7 3.7  Alpha 1     0.0 - 0.4 g/dL  0.2 0.2  Alpha2 Glob SerPl Elph-Mcnc     0.4 - 1.0 g/dL 0.6 0.5  B-Globulin SerPl Elph-Mcnc     0.7 - 1.3 g/dL 1.1 0.9  Gamma Glob SerPl Elph-Mcnc     0.4 - 1.8 g/dL 1.0 0.9  M Protein SerPl Elph-Mcnc     Not Observed g/dL 0.5 (H) 0.2 (H)  Globulin, Total     2.2 - 3.9 g/dL 2.8 2.5  Albumin/Glob SerPl     0.7 - 1.7 1.4 1.5  IFE 1      Comment Comment  Please Note (HCV):      Comment Comment   05/08/18 Left Neck Thyroid Cartilage Biopsy:        RADIOGRAPHIC STUDIES: I have personally reviewed the radiological images as listed and agreed with the findings in the report. Nm Pet Image Restage (ps) Whole Body  Result Date: 05/22/2018 CLINICAL DATA:  Subsequent treatment strategy for multiple myeloma. EXAM: NUCLEAR MEDICINE PET WHOLE BODY TECHNIQUE: 12.1 mCi F-18 FDG was injected intravenously. Full-ring PET imaging was performed from the skull base to thigh after the radiotracer. CT data was  obtained and used for attenuation correction and anatomic localization. Fasting blood glucose: 122 mg/dl COMPARISON:  PET-CT 05/23/2017 and neck CT 04/24/2018 FINDINGS: Mediastinal blood pool activity: SUV max 3.36 HEAD/NECK: Persistent large soft tissue mass centered at the destroyed left thyroid cartilage. This is hypermetabolic with SUV max of 1.96. No neck adenopathy.  The adjacent thyroid gland appears normal. Incidental CT findings: none CHEST: No hypermetabolic mediastinal or hilar nodes. No suspicious pulmonary nodules on the CT scan. No axillary lymphadenopathy. In the posterior upper thorax there is a cutaneous and subcutaneous lesion measuring approximately 3.0 x 2.6 cm. The lesion is hypermetabolic with SUV max of 2.22 and there is some surrounding interstitial changes with slight hypermetabolism and also slight skin thickening. Findings suspicious for cutaneous manifestation of multiple myeloma (plasmacytoma). Recommend correlation with clinical examination and potential biopsy. Incidental CT  findings: none ABDOMEN/PELVIS: No abnormal hypermetabolic activity within the liver, pancreas, adrenal glands, or spleen. No hypermetabolic lymph nodes in the abdomen or pelvis. Focus of hypermetabolism in the left aspect of the lower prostate gland is new since the prior PET-CT and could reflect prostate cancer. SUV max is 6.31. Recommend correlation with physical examination and PSA level. The seminal vesicles appear normal. Incidental CT findings: Gallstones are noted in the gallbladder but no findings for acute cholecystitis. Mild uniform bladder wall thickening. Small bilateral inguinal hernias containing fat. SKELETON: Overall stable appearing lytic myelomatous lesions throughout the axial and appendicular skeleton. Chronic appearing fairly aggressive lytic changes involving the sternum with a defect in the posterior cortex. Vague soft tissue component noted in the marrow space of the lower sternum is hypermetabolic with SUV max of 9.79. This was previously 6.52. No other definite hypermetabolic bone lesions. Incidental CT findings: none EXTREMITIES: No hypermetabolic lesions in the extremities are identified. Incidental CT findings: none IMPRESSION: 1. Soft tissue mass centered around the destroyed left thyroid cartilage is hypermetabolic and consistent with known plasmacytoma. No adenopathy in the neck. 2. Hypermetabolic 3 cm cutaneous and subcutaneous soft tissue mass involving the posterior upper thorax suspicious for cutaneous manifestation of myeloma (plasmacytoma). 3. Persistent and slightly progressive hypermetabolic sternal lesion. 4. Diffuse lytic myelomatous lesions throughout the bony structures but no other hypermetabolic foci. 5. Focus of hypermetabolism in the left aspect of the lower prostate gland, not present on the prior study. This could be an area of infection/inflammation or potential prostate cancer. Recommend correlation with physical examination and PSA level. Electronically Signed    By: Marijo Sanes M.D.   On: 05/22/2018 11:31   [place] Core Biopsy (soft Tissue)  Result Date: 05/08/2018 INDICATION: History of myeloma, left neck thyroid cartilage mass EXAM: ULTRASOUND CORE BIOPSY LEFT LARYNGEAL SOFT TISSUE MASS ORIGINATING FROM THE LEFT THYROID CARTILAGE MEDICATIONS: 1% LIDOCAINE LOCAL ANESTHESIA/SEDATION: Moderate (conscious) sedation was employed during this procedure. A total of Versed 2.0 mg and Fentanyl 100 mcg was administered intravenously. Moderate Sedation Time: 10 minutes. The patient's level of consciousness and vital signs were monitored continuously by radiology nursing throughout the procedure under my direct supervision. FLUOROSCOPY TIME:  Fluoroscopy Time: NONE. COMPLICATIONS: None immediate. PROCEDURE: Informed written consent was obtained from the patient after a thorough discussion of the procedural risks, benefits and alternatives. All questions were addressed. Maximal Sterile Barrier Technique was utilized including caps, mask, sterile gowns, sterile gloves, sterile drape, hand hygiene and skin antiseptic. A timeout was performed prior to the initiation of the procedure. previous imaging reviewed. preliminary ultrasound performed. the solid hypoechoic mass with microcalcifications from the left thyroid cartilage  was localized. overlying skin marked. under sterile conditions and local anesthesia, an 18 gauge core biopsy needle was advanced to the lesion. 18 gauge biopsy performed 5 times. samples placed in formalin. needle removed. post seizure imaging demonstrates no hemorrhage or hematoma. patient tolerated biopsy well. IMPRESSION: Successful ultrasound left thyroid cartilage/laryngeal mass core biopsies Electronically Signed   By: Jerilynn Mages.  Shick M.D.   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 Bone marrow biopsy apparently showed 50% kappa restricted plasma cells with an initial M spike of 3.4 g/dL with evidence of lytic lesions on his PET/CT scan including right rib fracture and lesions of the lumbar and cervical spine. -Patient was treated under protocol 11-C-0221 with from 02/21/2013 with  Carfilzomib/Revlimid/Dexamethasone for a total of 8 cycles. He reports that his stem cells were collected and stored after 4 cycles -Posttreatment bone marrow biopsy showed less than 5% plasma cells with a negative flow cytometry and improvement in his previously PET avid lesions. Some concern for new focus of activity at C2 lytic lesion at L4 and several small lesions throughout the vertebrae. -Patient was on maintenance lenalidomide 10 mg by mouth daily for 2 years or more until end of 2014. 08/19/2013 - bone marrow biopsy showed normocellular marrow with less than 5% plasma cells. PET CT scan showed decreased focal metabolic ligament prior rib lesions and no new lesions. Flow cytometry was negative for residual disease. Serum and urine electrophoresis and IFE showed no evidence of monoclonal protein. -Patient notes that he was off protocol for a few years due to lack of clinical trial research funding. -Patient notes that he is back on a follow-up protocol and continues to follow and receive his primary treatment at [name] at this time.  -Patient's labs show minimal IgG kappa protein on IFE and a CT chest in May 2018 that did not show any overt signs of myeloma progression in the bones.  Rpt Myeloma labs 03/2017 - M spike of 0.3g/dl with a repeat M spike stable at 0.3 g/dL on 05/23/2017 which remains stable on labs today 07/25/17 Serum kappa lambda free light chain ratio not significantly changed. PET/CT scan showed a single metabolically active sternal lesion without any overt bony destruction. Bone marrow biopsy done on 06/06/2017 - shows no overt involvement with multiple myeloma.  04/24/18 CT  Soft Tissue Neck revealed Left laryngeal 3 cm soft tissue mass appears to have originated within and is expanding the left thyroid cartilage. This is new since the August 2018 PET-CT, and there is absent lymphadenopathy. 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 Primary cartilaginous neoplasm, squamous cell carcinoma, and other differential considerations are less likely. Superimposed widespread multiple myeloma lesions in the visible skeleton.  05/08/18 Left neck thyroid cartilage biopsy revealed Plasma cell neoplasm.   2) Elevated bilirubin level . Repeat labs today show normalization of his bilirubin levels after switching to a three-week on one-week off regimen with the Revlimid.  PLAN:  -no new bone pains or other focal symptoms. -no prohibitive toxicities from Zometa or Revlimid maintenance at this time. -Will continue 58m Revlimid for now 3weeks on 1 week off -Continue Zometa -Discussed the 05/22/18 PET/CT which revealed Soft tissue mass centered around the destroyed left thyroid cartilage is hypermetabolic and consistent with known plasmacytoma. No adenopathy in  the neck. 2. Hypermetabolic 3 cm cutaneous and subcutaneous soft tissue mass involving the posterior upper thorax suspicious for cutaneous manifestation of myeloma (plasmacytoma). 3. Persistent and slightly progressive hypermetabolic sternal lesion. 4. Diffuse lytic myelomatous lesions throughout the bony structures but no other hypermetabolic foci. 5. Focus of hypermetabolism in the left aspect of the lower prostate gland, not present on the prior study. This could be an area of infection/inflammation or potential prostate cancer. Recommend correlation with physical examination and PSA level.  -Recommended that the pt call his dermatologist to confirm that the pathology was sent out on his previously removed cyst from his back -In clinical examination, the cyst-removal site is flat and healing  well -Will send PSA level and provide Urology referral -Continue follow up with Dr. Isidore [name] for RT of 25 Gy by 10 fractions -Continue Revlimid -Will see pt back in 4 weeks with labs and PSA level     3) PET/CT abnormality in the Prostate. PSA levels WNL -Primary care physician to consider urology referral  4) Abnormal focus of FDG avid at a spot in the liver - will need to be monitor on f/u scans. No pain or other symptoms at this time.   -continue Zometa q8weeks - please schedule next appointment -RTC with Dr Irene Limbo in 4 weeks with labs    All of the patients questions were answered with apparent satisfaction. The patient knows to call the clinic with any problems, questions or concerns.  The total time spent in the appt was 25 minutes and more than 50% was on counseling and direct patient cares.    Sullivan Lone MD MS AAHIVMS [facility] Hematology/Oncology Physician [facility]  (Office):       [phone] (Work cell):  [phone] (Fax):           [phone]  I, Baldwin Jamaica, am acting as a scribe for Dr. Irene Limbo  .I have reviewed the above documentation for accuracy and completeness, and I agree with the above. [name]

## 2018-05-31 NOTE — Telephone Encounter (Signed)
CALLED PATIENT TO INFORM OF LAB FOR 06-01-18 @ 12 PM, SPOKE WITH PATIENT'S WIFE AND SHE IS AWARE OF THIS APPT.

## 2018-06-01 ENCOUNTER — Inpatient Hospital Stay (HOSPITAL_BASED_OUTPATIENT_CLINIC_OR_DEPARTMENT_OTHER): Payer: Medicare Other | Admitting: Hematology

## 2018-06-01 ENCOUNTER — Ambulatory Visit
Admission: RE | Admit: 2018-06-01 | Discharge: 2018-06-01 | Disposition: A | Discharge status: Home / Self Care | Payer: Medicare Other | Source: Ambulatory Visit | Attending: Radiation Oncology | Admitting: Radiation Oncology

## 2018-06-01 ENCOUNTER — Encounter: Payer: Self-pay | Admitting: Hematology

## 2018-06-01 ENCOUNTER — Telehealth: Payer: Self-pay | Admitting: Hematology

## 2018-06-01 ENCOUNTER — Encounter: Payer: Self-pay | Admitting: *Deleted

## 2018-06-01 VITALS — BP 140/68 | HR 69 | Temp 99.1°F | Resp 18 | Ht 72.0 in | Wt 250.4 lb

## 2018-06-01 DIAGNOSIS — R932 Abnormal findings on diagnostic imaging of liver and biliary tract: Secondary | ICD-10-CM | POA: Diagnosis not present

## 2018-06-01 DIAGNOSIS — C9000 Multiple myeloma not having achieved remission: Secondary | ICD-10-CM

## 2018-06-01 DIAGNOSIS — R17 Unspecified jaundice: Secondary | ICD-10-CM | POA: Diagnosis not present

## 2018-06-01 DIAGNOSIS — R69 Illness, unspecified: Secondary | ICD-10-CM

## 2018-06-01 DIAGNOSIS — Z79899 Other long term (current) drug therapy: Secondary | ICD-10-CM | POA: Diagnosis not present

## 2018-06-01 DIAGNOSIS — C9030 Solitary plasmacytoma not having achieved remission: Secondary | ICD-10-CM

## 2018-06-01 DIAGNOSIS — R9389 Abnormal findings on diagnostic imaging of other specified body structures: Secondary | ICD-10-CM | POA: Diagnosis not present

## 2018-06-01 DIAGNOSIS — Z510 Encounter for antineoplastic radiation therapy: Secondary | ICD-10-CM | POA: Diagnosis not present

## 2018-06-01 DIAGNOSIS — R972 Elevated prostate specific antigen [PSA]: Secondary | ICD-10-CM

## 2018-06-01 DIAGNOSIS — R634 Abnormal weight loss: Secondary | ICD-10-CM

## 2018-06-01 DIAGNOSIS — C9002 Multiple myeloma in relapse: Secondary | ICD-10-CM | POA: Diagnosis not present

## 2018-06-01 LAB — TSH: TSH: 1.008 u[IU]/mL (ref 0.320–4.118)

## 2018-06-01 NOTE — Progress Notes (Signed)
  Radiation Oncology         [phone] ________________________________  Name: Michael [name] MRN: [id number]  Date: 06/01/2018  DOB: 1950/12/18  SIMULATION AND TREATMENT PLANNING NOTE  Outpatient  DIAGNOSIS:     ICD-10-CM   1. Multiple myeloma, remission status unspecified ([place]) C90.00     NARRATIVE:  The patient was brought to the [facility].  Identity was confirmed.  All relevant records and images related to the planned course of therapy were reviewed.  The patient freely provided informed written consent to proceed with treatment after reviewing the details related to the planned course of therapy. The consent form was witnessed and verified by the simulation staff.    Then, the patient was set-up in a stable reproducible  supine position for radiation therapy.  CT images were obtained.  Surface markings were placed.  The CT images were loaded into the planning software.    TREATMENT PLANNING NOTE: Treatment planning then occurred.  The radiation prescription was entered and confirmed.    A total of 3 medically necessary complex treatment devices were fabricated and supervised by me, in the form of 2 fields with MLCs to block esophagus and cord, and an Aquaplast mask device. MORE FIELDS WITH MLCs MAY BE ADDED IN DOSIMETRY for dose homogeneity.  I have requested : 3D Simulation  I have requested a DVH of the following structures: CTV, esophagus, cord.   The patient will receive 25 Gy in 10 fractions to the laryngeal/cartilage lesion.   -----------------------------------  [name], MD

## 2018-06-01 NOTE — Telephone Encounter (Signed)
Gave patient avs and calendar of upcoming appts.  °

## 2018-06-02 NOTE — Progress Notes (Signed)
Oncology Nurse Navigator Documentation  To provide support, encouragement and care continuity, met with Michael [name] during his CT SIM.  He was accompanied by his wife.   He tolerated procedure without difficulty, denied questions/concerns.   I toured them to [facility] 2 treatment area, explained procedures for lobby registration, arrival to Radiation Waiting, arrival to tmt area and preparation for tmt.  They voiced understanding.  Escorted them to lobby, showed them RadOnc kiosk, explained check-in procedure.  I encouraged them to call me with questions prior to [facility].  Gayleen Orem, RN, BSN Head & Neck Oncology Nurse [place] at [place] [phone]

## 2018-06-04 ENCOUNTER — Encounter: Payer: Self-pay | Admitting: *Deleted

## 2018-06-04 ENCOUNTER — Ambulatory Visit
Admission: RE | Admit: 2018-06-04 | Discharge: 2018-06-04 | Disposition: A | Discharge status: Home / Self Care | Payer: Medicare Other | Source: Ambulatory Visit | Attending: Radiation Oncology | Admitting: Radiation Oncology

## 2018-06-04 DIAGNOSIS — C9000 Multiple myeloma not having achieved remission: Secondary | ICD-10-CM

## 2018-06-04 DIAGNOSIS — Z510 Encounter for antineoplastic radiation therapy: Secondary | ICD-10-CM | POA: Diagnosis not present

## 2018-06-04 MED ORDER — SONAFINE EX EMUL
1.0000 "application " | Freq: Once | CUTANEOUS | Status: AC
  Administered 2018-06-04: 1 via TOPICAL

## 2018-06-04 NOTE — Progress Notes (Signed)
Oncology Nurse Navigator Documentation  To provide support, encouragement and care continuity, met with Michael [name] during [facility] [place] and [place]. He tolerated tmt without difficulty, denied questions concerns. I escorted him to RadOnc for PUT with Dr. [name]. I encouraged him to call me with needs/concerns.  He voiced agreement.  Gayleen Orem, RN, BSN Head & Neck Oncology Nurse [place] at [place] [phone]

## 2018-06-04 NOTE — Progress Notes (Signed)
Pt here for patient teaching.  Pt given Radiation and You booklet, Managing Acute Radiation Side Effects for Head and Neck Cancer handout, skin care instructions and Sonafine.  Reviewed areas of pertinence such as fatigue, skin changes and throat changes . Pt able to give teach back of to pat skin, use unscented/gentle soap and drink plenty of water,apply Sonafine bid, avoid applying anything to skin within 4 hours of treatment and to use an electric razor if they must shave. Pt verbalizes understanding of information given and will contact nursing with any questions or concerns.     [email or web address]

## 2018-06-05 ENCOUNTER — Ambulatory Visit
Admission: RE | Admit: 2018-06-05 | Discharge: 2018-06-05 | Disposition: A | Discharge status: Home / Self Care | Payer: Medicare Other | Source: Ambulatory Visit | Attending: Radiation Oncology | Admitting: Radiation Oncology

## 2018-06-05 DIAGNOSIS — C9000 Multiple myeloma not having achieved remission: Secondary | ICD-10-CM | POA: Diagnosis not present

## 2018-06-05 DIAGNOSIS — Z510 Encounter for antineoplastic radiation therapy: Secondary | ICD-10-CM | POA: Diagnosis not present

## 2018-06-06 ENCOUNTER — Ambulatory Visit
Admission: RE | Admit: 2018-06-06 | Discharge: 2018-06-06 | Disposition: A | Discharge status: Home / Self Care | Payer: Medicare Other | Source: Ambulatory Visit | Attending: Radiation Oncology | Admitting: Radiation Oncology

## 2018-06-06 DIAGNOSIS — C9000 Multiple myeloma not having achieved remission: Secondary | ICD-10-CM | POA: Diagnosis not present

## 2018-06-06 DIAGNOSIS — Z510 Encounter for antineoplastic radiation therapy: Secondary | ICD-10-CM | POA: Diagnosis not present

## 2018-06-07 ENCOUNTER — Telehealth: Payer: Self-pay | Admitting: *Deleted

## 2018-06-07 ENCOUNTER — Ambulatory Visit
Admission: RE | Admit: 2018-06-07 | Discharge: 2018-06-07 | Disposition: A | Discharge status: Home / Self Care | Payer: Medicare Other | Source: Ambulatory Visit | Attending: Radiation Oncology | Admitting: Radiation Oncology

## 2018-06-07 DIAGNOSIS — Z510 Encounter for antineoplastic radiation therapy: Secondary | ICD-10-CM | POA: Diagnosis not present

## 2018-06-07 DIAGNOSIS — C9000 Multiple myeloma not having achieved remission: Secondary | ICD-10-CM | POA: Diagnosis not present

## 2018-06-07 NOTE — Telephone Encounter (Signed)
Oncology Nurse Navigator Documentation  In follow-up to call from [facility] 2 RTT indicating pt missed 12:45 appt, called pt to check on his well-being, stated he thought appt was later today, indicated he will arrive in 10-15 minutes.  I notified [facility], provided appt calendar for them to provide him upon his arrival.  Gayleen Orem, Therapist, sports, BSN Head & Neck Oncology Nurse [place] at [place] [phone]

## 2018-06-08 ENCOUNTER — Ambulatory Visit
Admission: RE | Admit: 2018-06-08 | Discharge: 2018-06-08 | Disposition: A | Discharge status: Home / Self Care | Payer: Medicare Other | Source: Ambulatory Visit | Attending: Radiation Oncology | Admitting: Radiation Oncology

## 2018-06-08 DIAGNOSIS — C9000 Multiple myeloma not having achieved remission: Secondary | ICD-10-CM | POA: Diagnosis not present

## 2018-06-08 DIAGNOSIS — Z510 Encounter for antineoplastic radiation therapy: Secondary | ICD-10-CM | POA: Diagnosis not present

## 2018-06-11 ENCOUNTER — Ambulatory Visit
Admission: RE | Admit: 2018-06-11 | Discharge: 2018-06-11 | Disposition: A | Discharge status: Home / Self Care | Payer: Medicare Other | Source: Ambulatory Visit | Attending: Radiation Oncology | Admitting: Radiation Oncology

## 2018-06-11 ENCOUNTER — Other Ambulatory Visit: Payer: Self-pay | Admitting: Radiation Oncology

## 2018-06-11 DIAGNOSIS — C9000 Multiple myeloma not having achieved remission: Secondary | ICD-10-CM | POA: Diagnosis not present

## 2018-06-11 DIAGNOSIS — Z510 Encounter for antineoplastic radiation therapy: Secondary | ICD-10-CM | POA: Diagnosis not present

## 2018-06-11 MED ORDER — LIDOCAINE VISCOUS HCL 2 % MT SOLN: OROMUCOSAL | 3 refills | Status: DC

## 2018-06-11 MED ORDER — SUCRALFATE 1 G PO TABS: ORAL_TABLET | ORAL | 3 refills | Status: DC

## 2018-06-12 ENCOUNTER — Ambulatory Visit
Admission: RE | Admit: 2018-06-12 | Discharge: 2018-06-12 | Disposition: A | Discharge status: Home / Self Care | Payer: Medicare Other | Source: Ambulatory Visit | Attending: Radiation Oncology | Admitting: Radiation Oncology

## 2018-06-12 DIAGNOSIS — Z510 Encounter for antineoplastic radiation therapy: Secondary | ICD-10-CM | POA: Diagnosis not present

## 2018-06-12 DIAGNOSIS — C9000 Multiple myeloma not having achieved remission: Secondary | ICD-10-CM | POA: Diagnosis not present

## 2018-06-13 ENCOUNTER — Ambulatory Visit
Admission: RE | Admit: 2018-06-13 | Discharge: 2018-06-13 | Disposition: A | Discharge status: Home / Self Care | Payer: Medicare Other | Source: Ambulatory Visit | Attending: Radiation Oncology | Admitting: Radiation Oncology

## 2018-06-13 DIAGNOSIS — Z510 Encounter for antineoplastic radiation therapy: Secondary | ICD-10-CM | POA: Diagnosis not present

## 2018-06-13 DIAGNOSIS — C9000 Multiple myeloma not having achieved remission: Secondary | ICD-10-CM | POA: Diagnosis not present

## 2018-06-14 ENCOUNTER — Other Ambulatory Visit: Payer: Self-pay | Admitting: Hematology

## 2018-06-14 ENCOUNTER — Ambulatory Visit
Admission: RE | Admit: 2018-06-14 | Discharge: 2018-06-14 | Disposition: A | Discharge status: Home / Self Care | Payer: Medicare Other | Source: Ambulatory Visit | Attending: Radiation Oncology | Admitting: Radiation Oncology

## 2018-06-14 DIAGNOSIS — Z510 Encounter for antineoplastic radiation therapy: Secondary | ICD-10-CM | POA: Diagnosis not present

## 2018-06-14 DIAGNOSIS — C9000 Multiple myeloma not having achieved remission: Secondary | ICD-10-CM | POA: Diagnosis not present

## 2018-06-15 ENCOUNTER — Ambulatory Visit
Admission: RE | Admit: 2018-06-15 | Discharge: 2018-06-15 | Disposition: A | Discharge status: Home / Self Care | Payer: Medicare Other | Source: Ambulatory Visit | Attending: Radiation Oncology | Admitting: Radiation Oncology

## 2018-06-15 DIAGNOSIS — C9000 Multiple myeloma not having achieved remission: Secondary | ICD-10-CM | POA: Diagnosis not present

## 2018-06-15 DIAGNOSIS — Z510 Encounter for antineoplastic radiation therapy: Secondary | ICD-10-CM | POA: Diagnosis not present

## 2018-06-20 ENCOUNTER — Other Ambulatory Visit: Payer: Self-pay

## 2018-06-20 MED ORDER — LENALIDOMIDE 15 MG PO CAPS: 15.0000 mg | ORAL_CAPSULE | Freq: Every day | ORAL | 0 refills | Status: DC

## 2018-06-21 ENCOUNTER — Encounter: Payer: Self-pay | Admitting: Radiation Oncology

## 2018-06-22 ENCOUNTER — Encounter: Payer: Self-pay | Admitting: Radiation Oncology

## 2018-06-22 ENCOUNTER — Telehealth: Payer: Self-pay | Admitting: *Deleted

## 2018-06-22 ENCOUNTER — Other Ambulatory Visit: Payer: Self-pay | Admitting: Radiation Oncology

## 2018-06-22 DIAGNOSIS — C9000 Multiple myeloma not having achieved remission: Secondary | ICD-10-CM

## 2018-06-22 MED ORDER — IBUPROFEN 40 MG/ML PO SUSP: ORAL | 1 refills | Status: DC

## 2018-06-22 NOTE — Telephone Encounter (Signed)
Oncology Nurse Navigator Documentation  Returned patient wife's VMM indicating he is having difficulty swallowing d/t throat pain.  She stated:   He is able to swallow ice cream, milk shakes, plain yogurt but not without pain.    He was able to swallow an Advil gel capsule with difficulty but found relief, able to sleep through night.    He is requesting adult-strength liquid ibuprofen. I asked if he is using viscous lidocaine and/or sucralfate prescribed by Dr. [name].  She responded he is using viscous lidocaine and swallowing but with minimal relief.  Unsure if he has tried sucralfate.  Interventions:  I encouraged continued use of lidocaine as directed even if minimal relief, use of sucralfate as directed.  I noted Dr. [name] to be informed re request for liquid ibuprofen Rx, encouraged her to call [facility] to check for pickup.    She voiced understanding of guidance.  Gayleen Orem, RN, BSN Head & Neck Oncology Nurse [place] at [place] [phone]

## 2018-06-22 NOTE — Progress Notes (Signed)
  Radiation Oncology         [phone] ________________________________  Name: Michael [name] MRN: [id number]  Date: 06/22/2018  DOB: 1950/12/15  End of Treatment Note  Diagnosis:   Multiple Myeloma with involvement of the Left Thyroid Cartilage     Indication for treatment:  Palliative       Radiation treatment dates:   06/04/2018-06/15/2018  Site/dose:   Larynx, 2.5 Gy x 10 fractions for a total dose of 25 Gy   Beams/energy:   3D, 6X  Narrative: The patient tolerated radiation treatment relatively well. At the beginning of his treatment, he denied any concerns. Patient was given Sonafine and instructed on its use. On PE, it was noted that the patient was hoarse. Towards the end of his treatment, he noted mild throat soreness, dry mouth, thick saliva, taste changes, skin swelling, and slight redness. He denied fatigue or mouth sores. On PE, it was noted mild pink appearance to neck in radiation treatment fields.   Plan: The patient has completed radiation treatment. The patient will return to radiation oncology clinic for routine followup in one month. I advised them to call or return sooner if they have any questions or concerns related to their recovery or treatment.  -----------------------------------  [name], MD  This document serves as a record of services personally performed by [name], MD. It was created on her behalf by Steva Colder, a trained medical scribe. The creation of this record is based on the scribe's personal observations and the provider's statements to them. This document has been checked and approved by the attending provider.

## 2018-06-25 ENCOUNTER — Telehealth: Payer: Self-pay | Admitting: Radiation Oncology

## 2018-06-25 ENCOUNTER — Encounter: Payer: Self-pay | Admitting: Hematology

## 2018-06-25 ENCOUNTER — Telehealth: Payer: Self-pay | Admitting: Hematology

## 2018-06-25 NOTE — Telephone Encounter (Signed)
Per 9/9 patient called to reschedule before I could take time someone book in his slot and I had to call him back with a different time. I did leave a message

## 2018-06-25 NOTE — Telephone Encounter (Signed)
Resc. Pt for 9/18 at 11am. Pt agreeable.

## 2018-06-25 NOTE — Telephone Encounter (Signed)
LVM for patient to call me back to RS. He would like to cancel his appt for this Friday, 9/13.

## 2018-06-29 ENCOUNTER — Ambulatory Visit: Payer: Medicare Other | Admitting: Radiation Oncology

## 2018-06-29 ENCOUNTER — Ambulatory Visit: Payer: Medicare Other | Admitting: Hematology

## 2018-06-29 ENCOUNTER — Other Ambulatory Visit: Payer: Medicare Other

## 2018-07-04 ENCOUNTER — Encounter: Payer: Self-pay | Admitting: Radiation Oncology

## 2018-07-04 ENCOUNTER — Other Ambulatory Visit: Payer: Self-pay

## 2018-07-04 ENCOUNTER — Ambulatory Visit
Admission: RE | Admit: 2018-07-04 | Discharge: 2018-07-04 | Disposition: A | Discharge status: Home / Self Care | Payer: Medicare Other | Source: Ambulatory Visit | Attending: Radiation Oncology | Admitting: Radiation Oncology

## 2018-07-04 VITALS — BP 145/73 | HR 56 | Temp 97.9°F | Resp 18 | Ht 73.0 in | Wt 250.0 lb

## 2018-07-04 DIAGNOSIS — C9000 Multiple myeloma not having achieved remission: Secondary | ICD-10-CM | POA: Diagnosis not present

## 2018-07-04 DIAGNOSIS — Z923 Personal history of irradiation: Secondary | ICD-10-CM | POA: Diagnosis not present

## 2018-07-04 DIAGNOSIS — Z7982 Long term (current) use of aspirin: Secondary | ICD-10-CM | POA: Insufficient documentation

## 2018-07-04 DIAGNOSIS — Z79899 Other long term (current) drug therapy: Secondary | ICD-10-CM | POA: Diagnosis not present

## 2018-07-04 NOTE — Progress Notes (Signed)
Mr. Groman presents for follow up of radiation completed to his   Pain issues, if any: He reports some soreness to his throat. He is not currently taking any medicine for this pain.  Using a feeding tube?: N/A Weight changes, if any: 06/04/18 248.2 lbs  06/11/18 249.6 lbs 07/04/18 250.0 lbs Swallowing issues, if any: He denies difficulty swallowing.  Smoking or chewing tobacco? No Using fluoride trays daily? N/A Last ENT visit was on: N/A Other notable issues, if any:  Dr. Irene Limbo last on 06/01/18 next apt scheduled for 07/10/18 07/18/18 Zometa infusion.   Redness to bilateral neck and over bilateral clavicles. He is using sonafine and tells me that it is helping it.   BP (!) 145/73 (BP Location: Left Arm, Patient Position: Sitting)   Pulse (!) 56   Temp 97.9 F (36.6 C) (Oral)   Resp 18   Ht 6\' 1"  (1.854 m)   Wt 250 lb (113.4 kg)   SpO2 99%   BMI 32.98 kg/m

## 2018-07-04 NOTE — Progress Notes (Signed)
Radiation Oncology         [phone] ________________________________  Name: Tracy Kinner MRN: [id number]  Date: 07/04/2018  DOB: Mar 02, 1951  Follow-Up Visit Note  Outpatient  CC: [name], MD  [name], MD  Diagnosis and Prior Radiotherapy:    ICD-10-CM   1. Multiple myeloma, remission status unspecified ([place]) C90.00     Radiation treatment dates:   06/04/2018-06/15/2018  Site/dose:   Larynx, 2.5 Gy x 10 fractions for a total dose of 25 Gy   CHIEF COMPLAINT: Here for follow-up and surveillance of multiple myeloma  Narrative:  The patient returns today for routine follow-up. He cancelled his appt at [facility] due to health issues on the part of his wife. Hoarseness is stable.  He reports some soreness to his throat. He is not currently taking any medicine for this pain.  Using a feeding tube?: N/A Weight changes, if any: 06/04/18 248.2 lbs  06/11/18 249.6 lbs 07/04/18 250.0 lbs Swallowing issues, if any: He denies difficulty swallowing.  Smoking or chewing tobacco? No Other notable issues, if any:  Dr. Irene Limbo last on 06/01/18 next apt scheduled for 07/10/18 07/18/18 Zometa infusion.   Redness to bilateral neck and over bilateral clavicles. He is using sonafine and tells me that it is helping it.                             ALLERGIES:  has No Known Allergies.  Meds: Current Outpatient Medications  Medication Sig Dispense Refill  . aspirin 81 MG tablet Take 81 mg by mouth daily.    [name] atorvastatin (LIPITOR) 20 MG tablet Take 20 mg by mouth daily.    [name] lenalidomide (REVLIMID) 15 MG capsule Take 1 capsule (15 mg total) by mouth daily. Take days 1-21 every 28 days. Auth #[id number] 05/22/2018 21 capsule 0  . Multiple Vitamin (MULTIVITAMIN) tablet Take 1 tablet by mouth daily.    . pantoprazole (PROTONIX) 40 MG tablet Take 40 mg by mouth as needed.     [name] PAZEO 0.7 % SOLN Place 1 drop into both eyes daily.  11  . Ibuprofen 40 MG/ML SUSP Take 10-39m QID prn pain. Take with  food to protect stomach. (Patient not taking: Reported on 07/04/2018) 360 mL 1  . lidocaine (XYLOCAINE) 2 % solution Patient: Mix 1part 2% viscous lidocaine, 1part H20. Swallow 146mof diluted mixture, 3018mbefore meals and at bedtime, up to QID (Patient not taking: Reported on 07/04/2018) 100 mL 3  . sucralfate (CARAFATE) 1 g tablet Dissolve 1 tablet in 10 mL H20 and swallow up to QID for soreness. (Patient not taking: Reported on 07/04/2018) 28 tablet 3   No current facility-administered medications for this encounter.     Physical Findings: The patient is in no acute distress. Patient is alert and oriented.  height is 6' 1"  (1.854 m) and weight is 250 lb (113.4 kg). His oral temperature is 97.9 F (36.6 C). His blood pressure is 145/73 (abnormal) and his pulse is 56 (abnormal). His respiration is 18 and oxygen saturation is 99%. .     Erythema over lower neck w/in RT fields. Skin intact. Hoarse voice.  Well appearing.   Lab Findings: Lab Results  Component Value Date   WBC 3.6 (L) 05/18/2018   HGB 13.7 05/18/2018   HCT 40.2 05/18/2018   MCV 92.4 05/18/2018   PLT 150 05/18/2018    Radiographic Findings: No results found.  Impression/Plan:  Healing  well from RT. Voice may possibly improve in the next several months - will need to wait and see.  Apply Vit E lotion to skin for more healing.  Patient was given an informational document that our care team curated for head and neck cancer survivors to help with symptom management,and overall post-treatment wellness. Phone number of Gayleen Orem, RN, our Head and Neck Oncology Navigator was provided in case any questions arise.  I will see him back PRN, continue following with med/onc here as at [place] when feasible.    [name], MD

## 2018-07-06 ENCOUNTER — Encounter: Payer: Self-pay | Admitting: Radiation Oncology
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
## 2018-07-10 ENCOUNTER — Other Ambulatory Visit: Payer: Self-pay

## 2018-07-10 ENCOUNTER — Inpatient Hospital Stay: Payer: Medicare Other | Attending: Hematology

## 2018-07-10 ENCOUNTER — Inpatient Hospital Stay (HOSPITAL_BASED_OUTPATIENT_CLINIC_OR_DEPARTMENT_OTHER): Payer: Medicare Other | Admitting: Hematology

## 2018-07-10 VITALS — BP 140/69 | HR 64 | Temp 97.7°F | Resp 16 | Ht 73.0 in | Wt 251.4 lb

## 2018-07-10 DIAGNOSIS — Z125 Encounter for screening for malignant neoplasm of prostate: Secondary | ICD-10-CM | POA: Insufficient documentation

## 2018-07-10 DIAGNOSIS — Z7982 Long term (current) use of aspirin: Secondary | ICD-10-CM

## 2018-07-10 DIAGNOSIS — R9389 Abnormal findings on diagnostic imaging of other specified body structures: Secondary | ICD-10-CM | POA: Diagnosis not present

## 2018-07-10 DIAGNOSIS — Z809 Family history of malignant neoplasm, unspecified: Secondary | ICD-10-CM | POA: Insufficient documentation

## 2018-07-10 DIAGNOSIS — C9002 Multiple myeloma in relapse: Secondary | ICD-10-CM

## 2018-07-10 DIAGNOSIS — Z923 Personal history of irradiation: Secondary | ICD-10-CM | POA: Diagnosis not present

## 2018-07-10 DIAGNOSIS — R937 Abnormal findings on diagnostic imaging of other parts of musculoskeletal system: Secondary | ICD-10-CM | POA: Diagnosis not present

## 2018-07-10 DIAGNOSIS — C9030 Solitary plasmacytoma not having achieved remission: Secondary | ICD-10-CM

## 2018-07-10 DIAGNOSIS — E119 Type 2 diabetes mellitus without complications: Secondary | ICD-10-CM | POA: Insufficient documentation

## 2018-07-10 DIAGNOSIS — R972 Elevated prostate specific antigen [PSA]: Secondary | ICD-10-CM

## 2018-07-10 DIAGNOSIS — Z79899 Other long term (current) drug therapy: Secondary | ICD-10-CM | POA: Insufficient documentation

## 2018-07-10 DIAGNOSIS — C9000 Multiple myeloma not having achieved remission: Secondary | ICD-10-CM

## 2018-07-10 LAB — CBC WITH DIFFERENTIAL/PLATELET
BASOS ABS: 0.1 10*3/uL (ref 0.0–0.1)
Basophils Relative: 1 %
EOS PCT: 4 %
Eosinophils Absolute: 0.2 10*3/uL (ref 0.0–0.5)
HCT: 41.2 % (ref 38.4–49.9)
Hemoglobin: 14.3 g/dL (ref 13.0–17.1)
LYMPHS PCT: 24 %
Lymphs Abs: 0.9 10*3/uL (ref 0.9–3.3)
MCH: 31.8 pg (ref 27.2–33.4)
MCHC: 34.7 g/dL (ref 32.0–36.0)
MCV: 91.6 fL (ref 79.3–98.0)
Monocytes Absolute: 0.5 10*3/uL (ref 0.1–0.9)
Monocytes Relative: 13 %
NEUTROS ABS: 2.1 10*3/uL (ref 1.5–6.5)
Neutrophils Relative %: 58 %
PLATELETS: 132 10*3/uL — AB (ref 140–400)
RBC: 4.5 MIL/uL (ref 4.20–5.82)
RDW: 14.6 % (ref 11.0–14.6)
WBC: 3.6 10*3/uL — ABNORMAL LOW (ref 4.0–10.3)

## 2018-07-10 LAB — CMP (CANCER CENTER ONLY)
ALT: 29 U/L (ref 0–44)
ANION GAP: 8 (ref 5–15)
AST: 19 U/L (ref 15–41)
Albumin: 4 g/dL (ref 3.5–5.0)
Alkaline Phosphatase: 70 U/L (ref 38–126)
BILIRUBIN TOTAL: 1.4 mg/dL — AB (ref 0.3–1.2)
BUN: 8 mg/dL (ref 8–23)
CO2: 28 mmol/L (ref 22–32)
Calcium: 9 mg/dL (ref 8.9–10.3)
Chloride: 105 mmol/L (ref 98–111)
Creatinine: 0.82 mg/dL (ref 0.61–1.24)
GFR, Est AFR Am: >60 mL/min (ref >60)
Glucose, Bld: 133 mg/dL — ABNORMAL HIGH (ref 70–99)
POTASSIUM: 3.8 mmol/L (ref 3.5–5.1)
Sodium: 141 mmol/L (ref 135–145)
TOTAL PROTEIN: 7.2 g/dL (ref 6.5–8.1)

## 2018-07-10 MED ORDER — LENALIDOMIDE 15 MG PO CAPS: 15.0000 mg | ORAL_CAPSULE | Freq: Every day | ORAL | 0 refills | Status: DC

## 2018-07-11 ENCOUNTER — Telehealth: Payer: Self-pay

## 2018-07-11 LAB — MULTIPLE MYELOMA PANEL, SERUM
ALBUMIN/GLOB SERPL: 1.2 (ref 0.7–1.7)
Albumin SerPl Elph-Mcnc: 3.7 g/dL (ref 2.9–4.4)
Alpha 1: 0.2 g/dL (ref 0.0–0.4)
Alpha2 Glob SerPl Elph-Mcnc: 0.6 g/dL (ref 0.4–1.0)
B-GLOBULIN SERPL ELPH-MCNC: 1 g/dL (ref 0.7–1.3)
Gamma Glob SerPl Elph-Mcnc: 1.3 g/dL (ref 0.4–1.8)
Globulin, Total: 3.1 g/dL (ref 2.2–3.9)
IGG (IMMUNOGLOBIN G), SERUM: 1317 mg/dL (ref 700–1600)
IGM (IMMUNOGLOBULIN M), SRM: 25 mg/dL (ref 20–172)
IgA: 135 mg/dL (ref 61–437)
M PROTEIN SERPL ELPH-MCNC: 0.7 g/dL — AB
TOTAL PROTEIN ELP: 6.8 g/dL (ref 6.0–8.5)

## 2018-07-11 LAB — KAPPA/LAMBDA LIGHT CHAINS
KAPPA FREE LGHT CHN: 27.5 mg/L — AB (ref 3.3–19.4)
Kappa, lambda light chain ratio: 2.48 — ABNORMAL HIGH (ref 0.26–1.65)
Lambda free light chains: 11.1 mg/L (ref 5.7–26.3)

## 2018-07-11 LAB — PROSTATE-SPECIFIC AG, SERUM (LABCORP): PROSTATE SPECIFIC AG, SERUM: 0.9 ng/mL (ref 0.0–4.0)

## 2018-07-11 NOTE — Telephone Encounter (Signed)
Printed calender of upcoming appointment. Per 9/24 los. Mail a letter with th calender enclosed

## 2018-07-13 ENCOUNTER — Other Ambulatory Visit: Payer: Self-pay | Admitting: Hematology

## 2018-07-18 ENCOUNTER — Inpatient Hospital Stay: Payer: Medicare Other | Attending: Hematology

## 2018-07-18 ENCOUNTER — Ambulatory Visit: Payer: Medicare Other

## 2018-07-18 ENCOUNTER — Encounter: Payer: Self-pay | Admitting: Hematology

## 2018-07-18 VITALS — BP 124/71 | HR 57 | Temp 97.9°F | Resp 16

## 2018-07-18 DIAGNOSIS — C9002 Multiple myeloma in relapse: Secondary | ICD-10-CM | POA: Insufficient documentation

## 2018-07-18 MED ORDER — ZOLEDRONIC ACID 4 MG/100ML IV SOLN
4.0000 mg | Freq: Once | INTRAVENOUS | Status: AC
  Administered 2018-07-18: 4 mg via INTRAVENOUS
  Filled 2018-07-18: qty 100

## 2018-07-18 NOTE — Patient Instructions (Signed)

## 2018-08-06 ENCOUNTER — Telehealth: Payer: Self-pay

## 2018-08-06 NOTE — Telephone Encounter (Signed)
Pt request via MyChart for PSA and thyroid levels from [facility] on 07/11/18. Response by Erasmo Downer, RN from Dr. Irene Limbo that PSA WNL and M-spike slightly elevated at 0.7. Encouraged to call scheduling at [phone] if interested in a f/u with Dr. Irene Limbo regarding lab work. Pt responded with the following msg:  "Thanks Erasmo Downer.  I just got back from a two week Phase II Pilot Study of Avelumab in Combination with Hypofractionated Radiotherapy screening at [facility], [place].  I got accepted for the study. The Principal Investigator is Dr. [name] [phone]), email [email or web address]. During my 17 October OP-12 clinic appointment, Dr. Jaclyn Prime took me off Revlimid.  My first treatment starts in two weeks, 30 October. Michael [name]"  Msg forwarded to Dr. Irene Limbo via in-basket to confirm information received and MD notified of tx changes. Pt made aware that msg received and forwarded to MD. Encouraged pt to reach out to office with any further information or needs.

## 2018-08-08 ENCOUNTER — Telehealth: Payer: Self-pay

## 2018-08-08 NOTE — Telephone Encounter (Signed)
Received fax from [facility] requesting summary from Radiation Oncology. Paperwork given to Harlow Asa, RN with Dr. [name] to further communicate with [facility].

## 2018-08-14 ENCOUNTER — Other Ambulatory Visit: Payer: Self-pay | Admitting: *Deleted

## 2018-08-14 MED ORDER — LENALIDOMIDE 15 MG PO CAPS: 15.0000 mg | ORAL_CAPSULE | Freq: Every day | ORAL | 0 refills | Status: DC

## 2018-08-21 DIAGNOSIS — A044 Other intestinal Escherichia coli infections: Secondary | ICD-10-CM | POA: Diagnosis not present

## 2018-08-21 DIAGNOSIS — C9000 Multiple myeloma not having achieved remission: Secondary | ICD-10-CM | POA: Diagnosis not present

## 2018-09-03 DIAGNOSIS — L723 Sebaceous cyst: Secondary | ICD-10-CM | POA: Diagnosis not present

## 2018-09-03 DIAGNOSIS — L570 Actinic keratosis: Secondary | ICD-10-CM | POA: Diagnosis not present

## 2018-09-03 DIAGNOSIS — L821 Other seborrheic keratosis: Secondary | ICD-10-CM | POA: Diagnosis not present

## 2018-09-12 ENCOUNTER — Other Ambulatory Visit: Payer: Medicare Other

## 2018-09-12 ENCOUNTER — Ambulatory Visit: Payer: Medicare Other

## 2018-09-13 ENCOUNTER — Encounter: Payer: Self-pay | Admitting: Hematology

## 2018-09-17 ENCOUNTER — Ambulatory Visit: Payer: Medicare Other | Admitting: Hematology

## 2018-09-17 ENCOUNTER — Telehealth: Payer: Self-pay | Admitting: *Deleted

## 2018-09-17 ENCOUNTER — Other Ambulatory Visit: Payer: Medicare Other

## 2018-09-17 ENCOUNTER — Ambulatory Visit: Payer: Medicare Other

## 2018-09-17 DIAGNOSIS — H401131 Primary open-angle glaucoma, bilateral, mild stage: Secondary | ICD-10-CM | POA: Diagnosis not present

## 2018-09-17 NOTE — Telephone Encounter (Signed)
Patient called. Is part of [facility] trials and was advised that today's labs and infusion are not needed. Per patient, Dr. Irene Limbo is aware of details of trial. Patient states he will still come for MD appt if MD requests, but does noton come to other appts.  Per Dr. Irene Limbo, He does not need to see patient today. He stated [facility] protocol will take precedence, so cancelling all future appts is also appropriate. Per Dr. Irene Limbo, inform patient that he will support as appropriate. Patient advised that end of [facility] trial is April 18, 2019. He is starting radiation treatments 09/20/18 as part of the trial. Patient asked that Dr. Irene Limbo and Dr. Lanell Persons be informed of start of radiation. Patient and spouse stated understanding that all future [facility] appts are cancelled at this time and that support is offered for future care and thanked caller.

## 2018-09-18 DIAGNOSIS — C9000 Multiple myeloma not having achieved remission: Secondary | ICD-10-CM | POA: Diagnosis not present

## 2018-09-18 DIAGNOSIS — Z6832 Body mass index (BMI) 32.0-32.9, adult: Secondary | ICD-10-CM | POA: Diagnosis not present

## 2018-09-18 DIAGNOSIS — E7800 Pure hypercholesterolemia, unspecified: Secondary | ICD-10-CM | POA: Diagnosis not present

## 2018-09-18 DIAGNOSIS — E6609 Other obesity due to excess calories: Secondary | ICD-10-CM | POA: Diagnosis not present

## 2018-09-18 DIAGNOSIS — R7303 Prediabetes: Secondary | ICD-10-CM | POA: Diagnosis not present

## 2018-09-18 DIAGNOSIS — K219 Gastro-esophageal reflux disease without esophagitis: Secondary | ICD-10-CM | POA: Diagnosis not present

## 2018-09-18 DIAGNOSIS — G4733 Obstructive sleep apnea (adult) (pediatric): Secondary | ICD-10-CM | POA: Diagnosis not present

## 2018-10-15 ENCOUNTER — Emergency Department (HOSPITAL_COMMUNITY)
Admission: EM | Admit: 2018-10-15 | Discharge: 2018-10-15 | Disposition: A | Discharge status: Home / Self Care | Payer: Medicare Other | Attending: Emergency Medicine | Admitting: Emergency Medicine

## 2018-10-15 ENCOUNTER — Encounter (HOSPITAL_COMMUNITY): Payer: Self-pay | Admitting: Emergency Medicine

## 2018-10-15 ENCOUNTER — Emergency Department (HOSPITAL_COMMUNITY): Payer: Medicare Other

## 2018-10-15 DIAGNOSIS — C9000 Multiple myeloma not having achieved remission: Secondary | ICD-10-CM | POA: Diagnosis not present

## 2018-10-15 DIAGNOSIS — M549 Dorsalgia, unspecified: Secondary | ICD-10-CM

## 2018-10-15 DIAGNOSIS — M545 Low back pain: Secondary | ICD-10-CM | POA: Diagnosis not present

## 2018-10-15 DIAGNOSIS — M546 Pain in thoracic spine: Secondary | ICD-10-CM | POA: Diagnosis not present

## 2018-10-15 DIAGNOSIS — Z7982 Long term (current) use of aspirin: Secondary | ICD-10-CM | POA: Insufficient documentation

## 2018-10-15 DIAGNOSIS — Z923 Personal history of irradiation: Secondary | ICD-10-CM | POA: Diagnosis not present

## 2018-10-15 MED ORDER — KETOROLAC TROMETHAMINE 60 MG/2ML IM SOLN
30.0000 mg | Freq: Once | INTRAMUSCULAR | Status: AC
  Administered 2018-10-15: 30 mg via INTRAMUSCULAR
  Filled 2018-10-15: qty 2

## 2018-10-15 MED ORDER — DIAZEPAM 5 MG PO TABS: 5.0000 mg | ORAL_TABLET | Freq: Four times a day (QID) | ORAL | 0 refills | Status: DC | PRN

## 2018-10-15 MED ORDER — OXYCODONE-ACETAMINOPHEN 5-325 MG PO TABS: 2.0000 | ORAL_TABLET | ORAL | 0 refills | Status: DC | PRN

## 2018-10-15 MED ORDER — HYDROMORPHONE HCL 1 MG/ML IJ SOLN
1.0000 mg | Freq: Once | INTRAMUSCULAR | Status: AC
  Administered 2018-10-15: 1 mg via INTRAMUSCULAR
  Filled 2018-10-15: qty 1

## 2018-10-15 MED ORDER — LIDO-CAPSAICIN-MEN-METHYL SAL 0.5-0.035-5-20 % EX PTCH: 1.0000 | MEDICATED_PATCH | Freq: Two times a day (BID) | CUTANEOUS | 0 refills | Status: DC | PRN

## 2018-10-15 NOTE — Discharge Instructions (Addendum)
Your x-ray does not show any fractures.  Sometimes x-rays can miss subtle fractures if you have persistent pain after a week get rechecked by your doctors for consideration of MRI.  I do not know that this would change any management but at least you would know if you have persistent pain.  The medications I have prescribed you at the pharmacy and they can make you sleepy, nauseous or dizzy.  Please take them when you know you are not going to be driving or going anywhere and be careful around the house.

## 2018-10-15 NOTE — ED Notes (Signed)
ED Provider at bedside. 

## 2018-10-15 NOTE — ED Notes (Signed)
Bed: WA20 Expected date:  Expected time:  Means of arrival:  Comments: Held for cancer pt.

## 2018-10-15 NOTE — ED Triage Notes (Signed)
Patient here from home with complaints of back pain. Reports hurting back 4 days ago, hanging decorations. Cancer patient, reports new lesions on back, cancer related. Pain 10/10.

## 2018-10-16 NOTE — ED Provider Notes (Signed)
[facility] DEPT Provider Note   CSN: [id number] Arrival date & time: 10/15/18  1314     History   Chief Complaint Chief Complaint  Patient presents with  . Back Pain  . Cancer Patient    HPI Michael [name] is a 67 y.o. male.   Back Pain   This is a new problem. The current episode started more than 2 days ago. The problem occurs constantly. The problem has not changed since onset.The pain is associated with no known injury. The pain is present in the thoracic spine. The quality of the pain is described as stabbing and shooting. The pain is severe. The symptoms are aggravated by twisting, bending and certain positions. The pain is worse during the day. He has tried NSAIDs, heat and walking for the symptoms. Risk factors include obesity and a history of cancer.    Past Medical History:  Diagnosis Date  . Angioedema   . CPAP (continuous positive airway pressure) dependence    uses at night  . Gastroesophageal reflux disease without esophagitis   . History of radiation therapy 06/04/18- 06/15/18   25 Gy in 10 fractions to the laryngeal/ cartilage lesion  . Hyperlipidemia   . Multiple myeloma ([place]) 2012   Treated at [facility]  . Non morbid obesity due to excess calories   . Prediabetes   . Prediabetes   . Unspecified glaucoma(365.9)   . Urticaria, unspecified     Patient Active Problem List   Diagnosis Date Noted  . Multiple myeloma ([place]) 03/28/2017    Past Surgical History:  Procedure Laterality Date  . PORTACATH PLACEMENT    . PORTACATH PLACEMENT     removed 02/2016        Home Medications    Prior to Admission medications   Medication Sig Start Date End Date Taking? Authorizing Provider  aspirin 81 MG tablet Take 81 mg by mouth daily.   Yes [provider]  atorvastatin (LIPITOR) 20 MG tablet Take 20 mg by mouth every evening.    Yes [provider]  cyclobenzaprine (FLEXERIL) 5 MG tablet Take 5 mg by mouth 3  (three) times daily as needed for muscle spasms.   Yes [provider]  Multiple Vitamin (MULTIVITAMIN) tablet Take 1 tablet by mouth daily.   Yes [provider]  naproxen sodium (ALEVE) 220 MG tablet Take 440 mg by mouth every 12 (twelve) hours.   Yes [provider]  pantoprazole (PROTONIX) 40 MG tablet Take 40 mg by mouth daily.    Yes [provider]  sodium chloride 0.9 % SOLN 250 mL with avelumab 200 MG/10ML SOLN 10 mg/kg Inject 800 mg/kg into the vein every 14 (fourteen) days.   Yes [provider]  diazepam (VALIUM) 5 MG tablet Take 1 tablet (5 mg total) by mouth every 6 (six) hours as needed for muscle spasms. 10/15/18   Rasheen Bells, Corene Cornea, MD  lenalidomide (REVLIMID) 15 MG capsule Take 1 capsule (15 mg total) by mouth daily. Take days 1-21 every 28 days. Josem Kaufmann #[id number] 08/14/18 08/14/18   [name], MD  Lido-Capsaicin-Men-Methyl Sal (MEDI-PATCH-LIDOCAINE) 0.5-0.035-5-20 % PTCH Apply 1 patch topically 2 (two) times daily as needed. 10/15/18   Carynn Felling, Corene Cornea, MD  oxyCODONE-acetaminophen (PERCOCET) 5-325 MG tablet Take 2 tablets by mouth every 4 (four) hours as needed. 10/15/18   Jenner Rosier, Corene Cornea, MD    Family History Family History  Problem Relation Age of Onset  . Cancer Father     Social History  Social History   Tobacco Use  . Smoking status: Never Smoker  . Smokeless tobacco: Never Used  Substance Use Topics  . Alcohol use: Yes    Alcohol/week: 0.0 standard drinks    Comment: Occasional beer  . Drug use: No     Allergies   Patient has no known allergies.   Review of Systems Review of Systems  Musculoskeletal: Positive for back pain.  All other systems reviewed and are negative.    Physical Exam Updated Vital Signs BP (!) 148/74   Pulse 76   Temp 98.4 F (36.9 C) (Oral)   Resp 12   Ht 6' 1"  (1.854 m)   Wt 113.5 kg   SpO2 99%   BMI 33.02 kg/m   Physical Exam Vitals signs and nursing note reviewed.    Constitutional:      Appearance: He is well-developed.  HENT:     Head: Normocephalic and atraumatic.  Eyes:     Extraocular Movements: Extraocular movements intact.     Conjunctiva/sclera: Conjunctivae normal.  Neck:     Musculoskeletal: Normal range of motion.  Cardiovascular:     Rate and Rhythm: Normal rate.  Pulmonary:     Effort: Pulmonary effort is normal. No respiratory distress.  Abdominal:     General: Abdomen is flat. There is no distension.  Musculoskeletal: Normal range of motion.        General: Tenderness (bilateral paraspinal muscles in lower thoracic/upper sacral area) present. No deformity or signs of injury.  Skin:    General: Skin is warm and dry.  Neurological:     Mental Status: He is alert.      ED Treatments / Results  Labs (all labs ordered are listed, but only abnormal results are displayed) Labs Reviewed - No data to display  EKG None  Radiology Dg Lumbar Spine Complete  Result Date: 10/15/2018 CLINICAL DATA:  Low back pain for 4 days since hanging Christmas decorations. The patient has a history of multiple myeloma. EXAM: LUMBAR SPINE - COMPLETE 4+ VIEW COMPARISON:  None. FINDINGS: Vertebral body height and alignment are maintained. Marked loss of disc space height and endplate spurring are seen at L3-4, L4-5 and L5-S1. Facet degenerative disease is also present at these levels. Known myeloma is deposits are not visible on plain films. Paraspinous structures demonstrate atherosclerosis. IMPRESSION: No acute abnormality. Advanced appearing degenerative disease L3-4, L4-5 and L5-S1. Known myeloma is deposits in the spine are not visible on plain films. Electronically Signed   By: Inge Rise M.D.   On: 10/15/2018 15:30   Dg Pelvis 1-2 Views  Result Date: 10/15/2018 CLINICAL DATA:  Low back pain for 4 days since hanging Christmas decorations. History of multiple myeloma. EXAM: PELVIS - 1-2 VIEW COMPARISON:  None. FINDINGS: There is no evidence  of pelvic fracture or diastasis. No pelvic bone lesions are seen. No lytic or sclerotic lesions are identified. There is some degenerative change about the SI joints. IMPRESSION: No acute or focal abnormality. No lytic lesions are identified on this exam. Bilateral SI joint osteoarthritis. Electronically Signed   By: Inge Rise M.D.   On: 10/15/2018 15:33    Procedures Procedures (including critical care time)  Medications Ordered in ED Medications  HYDROmorphone (DILAUDID) injection 1 mg (1 mg Intramuscular Given 10/15/18 1439)  ketorolac (TORADOL) injection 30 mg (30 mg Intramuscular Given 10/15/18 1438)     Initial Impression / Assessment and Plan / ED Course  I have reviewed the triage vital signs and  the nursing notes.  Pertinent labs & imaging results that were available during my care of the patient were reviewed by me and considered in my medical decision making (see chart for details).     X-rays done without any evidence of fracture.  It is possibly An occult pathologic fracture however his pain does not seem to be in trouble.  Even if he did have an occult fracture it would not be a surgical fix so we will treat symptomatically and conservatively for now and if not improving about a week and follow-up with his primary doctor to get an MRI.  Final Clinical Impressions(s) / ED Diagnoses   Final diagnoses:  Acute bilateral back pain, unspecified back location    ED Discharge Orders         Ordered    oxyCODONE-acetaminophen (PERCOCET) 5-325 MG tablet  Every 4 hours PRN     10/15/18 1613    diazepam (VALIUM) 5 MG tablet  Every 6 hours PRN     10/15/18 1613    Lido-Capsaicin-Men-Methyl Sal (MEDI-PATCH-LIDOCAINE) 0.5-0.035-5-20 % PTCH  2 times daily PRN     10/15/18 1613           Jeanice Dempsey, Corene Cornea, MD 10/17/18 [phone]

## 2018-10-22 ENCOUNTER — Telehealth: Payer: Self-pay | Admitting: *Deleted

## 2018-10-22 ENCOUNTER — Other Ambulatory Visit: Payer: Self-pay | Admitting: *Deleted

## 2018-10-22 DIAGNOSIS — C9002 Multiple myeloma in relapse: Secondary | ICD-10-CM

## 2018-10-22 NOTE — Telephone Encounter (Signed)
Patient called. [facility] team has stopped his treatment as of 10/18/2018. He needs to have Dr. Irene Limbo contact Dr. Lacy Duverney @ [phone] and /or Dr. Jaclyn Prime [phone] as soon as possible. He thinks he needs to be seen here at [facility]. Message given to Dr. Irene Limbo.

## 2018-10-23 ENCOUNTER — Telehealth: Payer: Self-pay | Admitting: Hematology

## 2018-10-23 DIAGNOSIS — K429 Umbilical hernia without obstruction or gangrene: Secondary | ICD-10-CM | POA: Diagnosis not present

## 2018-10-23 NOTE — Telephone Encounter (Signed)
Scheduled appt per 1/6 sch  Message - pt is aware of appt date and time

## 2018-10-25 NOTE — Progress Notes (Signed)
[name]    HEMATOLOGY/ONCOLOGY CLINIC NOTE  Date of Service: 10/26/18     Patient Care Team: [name], MD as PCP - General (Family Medicine) [name], MD as Consulting Physician (Hematology) [name], MD as Attending Physician (Radiation Oncology) [name], RN as Oncology Nurse Navigator (Oncology) [name] MD ([facility] _ primary oncology) phone number [phone] Email- [email or web address]  CHIEF COMPLAINTS  followup of multiple myeloma.  HISTORY OF PRESENTING ILLNESS:   Aldwin Micalizzi is a wonderful 68 y.o. male , retired [employer] who has been referred to [place] by Dr .Sabra Heck, [name], MD for establishing local oncology care "In case needed for treatment/management of complications pertaining to myeloma"  Patient has a history of multiple myeloma and is currently being followed actively managed at [facility] by Dr.[name] MD who is his primary oncologist. Patient is currently on a study protocol and is being monitored for myeloma recurrence.  Based on available records being put together  -Patient was diagnosed with IgG kappa ISS stage I multiple myeloma with hyperdiploid complex cytogenetics in 2012. Bone marrow biopsy apparently showed 50% kappa restricted plasma cells with an initial M spike of 3.4 g/dL with evidence of lytic lesions on his PET/CT scan including right rib fracture and lesions of the lumbar and cervical spine. -Patient was treated under protocol 11-C-0221 with from 02/21/2013 with  Carfilzomib/Revlimid/Dexamethasone for a total of 8 cycles. He reports that his stem cells were collected and stored after 4 cycles -Posttreatment bone marrow biopsy showed less than 5% plasma cells with a negative flow cytometry and improvement in his previously PET avid lesions. Some concern for new focus of activity at C2 lytic lesion at L4 and several small lesions throughout the vertebrae. -Patient was on maintenance lenalidomide 10 mg by  mouth daily for 2 years or more until end of 2014. 08/19/2013 - bone marrow biopsy showed normocellular marrow with less than 5% plasma cells. PET CT scan showed decreased focal metabolic ligament prior rib lesions and no new lesions. Flow cytometry was negative for residual disease. Serum and urine electrophoresis and IFE showed no evidence of monoclonal protein. -Patient notes that he was off protocol for a few years due to lack of clinical trial research funding. -Patient notes that he is back on a follow-up protocol and continues to follow and receive [facility] primary [facility] at this time. -Patient reports he had his last PET CT scan blood and urine tests and bone marrow examination in February 2018. The results of which are not available to [place] currently.  He notes that there was concern for a very slowly growing sternal FDG avid lesion that has been present for years. He has a follow-up at [facility] next month to determine the management of this lesion. He reports that was some consideration of considering radiation. he notes that this lesion has not been biopsied. No other focal bone pains at this time.  We discussed in detail about what our role will be and noted that we shall be available for any acute issues that need to be taken care of locally but that at this time the primary treatment and evaluation will be driven by his team at [facility] as per their research protocols and input. The patient and his wife are clearly aware of this and are in agreement with this plan.  INTERVAL HISTORY  Mr. Niklaus Mamaril comes is here for continued management of his Multiple Myeloma and recently diagnosed Plasmacytoma. The patient's last visit with [place]  was on 07/10/18. He is accompanied today by his wife. The pt reports that he is doing well overall.   In the interim, the pt has been pursuing a clinical [facility] of a combined immunotherapy and hypo-fractionated RT. The pt was shown to have  progression; his 10/02/18 M spike was seen at 2.2g of IgG Kappa, and 8.66g of Free Kappa light chains; his 10/18/18 M spike was seen at 3.5g of IgG Kappa, and 10.89g of Free Kappa light chains. A 10/18/18 MRI Lumbar revealed a compression fracture at T12 with a soft tissue mass emanating from the mid posterior margins of the T12 vertebral body.The 12/17/9 PET/CT also revealed interval development of hypermetabolism in the left clavicle, left scapula bilateral ribs, spine, and left proximal femur, as well as increased metabolism and size of the previously seen sternal lesion, as compared to a 07/24/18 PET/CT at the [facility].  The pt received his last dose of immunotherapy last week on 10/18/18.  The pt reports that he has not had Zometa since October. He has been staying well hydrated, drinking water "every time he turns around."  The pt endorses bilateral rib pain worse on the right, and back pain in the center of the spine, which is limiting his functioning. He notes that the pain is worse when he sits and when he lifts items. The pt has been taking Percocet and Aleve to treat his pain, and notes his pain is quite unbearable when he doesn't take pain medication. The pt also endorses constipation but has been taking Miralax for relief.  He notes that his voice is improved but still doesn't have the full strength of his voice, unless he speaks very loudly.   The pt is not taking any DM medications at this time, but has bee checking his blood sugars which have been mostly in the 120s.  Lab results today (10/26/18) of CBC w/diff and CMP is as follows: all values are WNL except for WBC at 3.2k, RBC at 3.54, HGB at 11.4, HCT at 33.8, Lymphs abs at 600, Abs immature granulocytes at 0.25k, Glucose at 104, Calcium at 12.7, Total Protein at 10.5. 10/26/18 LDH at 167 10/26/18 Sed Rate at 98  On review of systems, pt reports staying hydrated, bilateral rib pain, central pain along the mid-spine, constipation, improving voice,  and denies pain in hips, leg swelling, and any other symptoms.   MEDICAL HISTORY:  Past Medical History:  Diagnosis Date  . Angioedema   . CPAP (continuous positive airway pressure) dependence    uses at night  . Gastroesophageal reflux disease without esophagitis   . History of radiation therapy 06/04/18- 06/15/18   25 Gy in 10 fractions to the laryngeal/ cartilage lesion  . Hyperlipidemia   . Multiple myeloma ([place]) 2012   Treated at [facility]  . Non morbid obesity due to excess calories   . Prediabetes   . Prediabetes   . Unspecified glaucoma(365.9)   . Urticaria, unspecified   Diabetes mellitus Glaucoma Left toes neuropathy  GERD  SURGICAL HISTORY: Past Surgical History:  Procedure Laterality Date  . PORTACATH PLACEMENT    . PORTACATH PLACEMENT     removed 02/2016    SOCIAL HISTORY: Social History   Socioeconomic History  . Marital status: Married    Spouse name: Not on file  . Number of children: 2  . Years of education: Not on file  . Highest education level: Not on file  Occupational History  . Occupation: Retired Chief Strategy Officer  for [employer]  . Financial resource strain: Not on file  . Food insecurity:    Worry: Not on file    Inability: Not on file  . Transportation needs:    Medical: Not on file    Non-medical: Not on file  Tobacco Use  . Smoking status: Never Smoker  . Smokeless tobacco: Never Used  Substance and Sexual Activity  . Alcohol use: Yes    Alcohol/week: 0.0 standard drinks    Comment: Occasional beer  . Drug use: No  . Sexual activity: Not on file  Lifestyle  . Physical activity:    Days per week: Not on file    Minutes per session: Not on file  . Stress: Not on file  Relationships  . Social connections:    Talks on phone: Not on file    Gets together: Not on file    Attends religious service: Not on file    Active member of club or organization: Not on file    Attends meetings of clubs or organizations: Not on file     Relationship status: Not on file  . Intimate partner violence:    Fear of current or ex partner: No    Emotionally abused: No    Physically abused: No    Forced sexual activity: No  Other Topics Concern  . Not on file  Social History Narrative   Unable to [employer] partner violence- partner in room   Never smoker Married Retired from the [employer] in June 2017 and moved to [facility].  FAMILY HISTORY: Family History  Problem Relation Age of Onset  . Cancer Father     ALLERGIES:  has No Known Allergies.  MEDICATIONS:  Current Outpatient Medications  Medication Sig Dispense Refill  . aspirin 81 MG tablet Take 81 mg by mouth daily.    [name] atorvastatin (LIPITOR) 20 MG tablet Take 20 mg by mouth every evening.     . Lido-Capsaicin-Men-Methyl Sal (MEDI-PATCH-LIDOCAINE) 0.5-0.035-5-20 % PTCH Apply 1 patch topically 2 (two) times daily as needed. 30 patch 0  . Multiple Vitamin (MULTIVITAMIN) tablet Take 1 tablet by mouth daily.    . naproxen sodium (ALEVE) 220 MG tablet Take 440 mg by mouth every 12 (twelve) hours.    [name] nystatin (MYCOSTATIN/NYSTOP) powder Apply 100,000 g topically daily as needed.    [name] oxyCODONE-acetaminophen (PERCOCET) 5-325 MG tablet Take 2 tablets by mouth every 4 (four) hours as needed. 20 tablet 0  . pantoprazole (PROTONIX) 40 MG tablet Take 40 mg by mouth daily.     . polyethylene glycol (MIRALAX) packet Take 17 g by mouth daily.    Orlie Dakin SODIUM PO Take 1 tablet by mouth daily. Takes 50 mg    . cyclobenzaprine (FLEXERIL) 5 MG tablet Take 5 mg by mouth 3 (three) times daily as needed for muscle spasms.    . diazepam (VALIUM) 5 MG tablet Take 1 tablet (5 mg total) by mouth every 6 (six) hours as needed for muscle spasms. 10 tablet 0  . lenalidomide (REVLIMID) 15 MG capsule Take 1 capsule (15 mg total) by mouth daily. Take days 1-21 every 28 days. Josem Kaufmann #[id number] 08/14/18 (Patient not taking: Reported on 10/26/2018) 21 capsule 0  .  sodium chloride 0.9 % SOLN 250 mL with avelumab 200 MG/10ML SOLN 10 mg/kg Inject 800 mg/kg into the vein every 14 (fourteen) days.     Current Facility-Administered Medications  Medication Dose Route Frequency Provider Last Rate  Last Dose  . dexamethasone (DECADRON) 20 mg in sodium chloride 0.9 % 50 mL IVPB  20 mg Intravenous Once [name], MD        REVIEW OF SYSTEMS:    A 10+ POINT REVIEW OF SYSTEMS WAS OBTAINED including neurology, dermatology, psychiatry, cardiac, respiratory, lymph, extremities, GI, GU, Musculoskeletal, constitutional, breasts, reproductive, HEENT.  All pertinent positives are noted in the HPI.  All others are negative.   PHYSICAL EXAMINATION: ECOG PERFORMANCE STATUS: 1 - Symptomatic but completely ambulatory  Vitals:   10/26/18 1231  BP: 132/75  Pulse: 75  Resp: 18  Temp: 98.1 F (36.7 C)  TempSrc: Oral  SpO2: 98%  Weight: 239 lb 8 oz (108.6 kg)  Height: 6' 1"  (1.854 m)   Body mass index is 31.6 kg/m.   GENERAL:alert, in no acute distress and comfortable SKIN: no acute rashes, no significant lesions EYES: conjunctiva are pink and non-injected, sclera anicteric OROPHARYNX: MMM, no exudates, no oropharyngeal erythema or ulceration NECK: supple, no JVD LYMPH:  no palpable lymphadenopathy in the cervical, axillary or inguinal regions LUNGS: clear to auscultation b/l with normal respiratory effort HEART: regular rate & rhythm ABDOMEN:  normoactive bowel sounds , non tender, not distended. No palpable hepatosplenomegaly.  Extremity: no pedal edema PSYCH: alert & oriented x 3 with fluent speech NEURO: no focal motor/sensory deficits   LABORATORY DATA:  I have reviewed the data as listed  . CBC Latest Ref Rng & Units 10/26/2018 07/10/2018 05/18/2018  WBC 4.0 - 10.5 K/uL 3.2(L) 3.6(L) 3.6(L)  Hemoglobin 13.0 - 17.0 g/dL 11.4(L) 14.3 13.7  Hematocrit 39.0 - 52.0 % 33.8(L) 41.2 40.2  Platelets 150 - 400 K/uL 194 132(L) 150  ANC 1200  . CMP  Latest Ref Rng & Units 10/26/2018 07/10/2018 04/23/2018  Glucose 70 - 99 mg/dL 104(H) 133(H) 113(H)  BUN 8 - 23 mg/dL 11 8 14   Creatinine 0.61 - 1.24 mg/dL 1.03 0.82 0.87  Sodium 135 - 145 mmol/L 135 141 141  Potassium 3.5 - 5.1 mmol/L 4.1 3.8 3.7  Chloride 98 - 111 mmol/L 103 105 104  CO2 22 - 32 mmol/L 27 28 29   Calcium 8.9 - 10.3 mg/dL 12.7(H) 9.0 9.6  Total Protein 6.5 - 8.1 g/dL 10.5(H) 7.2 7.0  Total Bilirubin 0.3 - 1.2 mg/dL 0.8 1.4(H) 1.3(H)  Alkaline Phos 38 - 126 U/L 126 70 58  AST 15 - 41 U/L 23 19 22   ALT 0 - 44 U/L 25 29 29    Component     Latest Ref Rng & Units 05/23/2017 07/25/2017 10/03/2017 11/24/2017  IgG (Immunoglobin G), Serum     700 - 1,600 mg/dL 942 934 1,026 1,068  IgA     61 - 437 mg/dL    121  IgM (Immunoglobulin M), Srm     20 - 172 mg/dL    30  Total Protein ELP     6.0 - 8.5 g/dL    6.8  Albumin SerPl Elph-Mcnc     2.9 - 4.4 g/dL 4.0 3.6 4.1 4.0  Alpha 1     0.0 - 0.4 g/dL 0.2 0.2 0.2 0.2  Alpha2 Glob SerPl Elph-Mcnc     0.4 - 1.0 g/dL 0.6 0.7 0.6 0.6  B-Globulin SerPl Elph-Mcnc     0.7 - 1.3 g/dL 1.0 1.1 1.0 1.0  Gamma Glob SerPl Elph-Mcnc     0.4 - 1.8 g/dL 1.0 1.2 1.1 0.9  M Protein SerPl Elph-Mcnc     Not Observed g/dL 0.3 (  H) 0.3 (H) 0.4 (H) 0.7 (H)  Globulin, Total     2.2 - 3.9 g/dL 2.9 3.1 2.9 2.8  Albumin/Glob SerPl     0.7 - 1.7 1.4 1.2 1.5 1.5  IFE 1      Comment Comment Comment Comment  Please Note (HCV):      Comment Comment Comment Comment   Component     Latest Ref Rng & Units 01/19/2018 03/15/2018  IgG (Immunoglobin G), Serum     700 - 1,600 mg/dL 1,035 991  IgA     61 - 437 mg/dL 122 121  IgM (Immunoglobulin M), Srm     20 - 172 mg/dL 24 32  Total Protein ELP     6.0 - 8.5 g/dL 6.5 6.2  Albumin SerPl Elph-Mcnc     2.9 - 4.4 g/dL 3.7 3.7  Alpha 1     0.0 - 0.4 g/dL 0.2 0.2  Alpha2 Glob SerPl Elph-Mcnc     0.4 - 1.0 g/dL 0.6 0.5  B-Globulin SerPl Elph-Mcnc     0.7 - 1.3 g/dL 1.1 0.9  Gamma Glob SerPl Elph-Mcnc     0.4 -  1.8 g/dL 1.0 0.9  M Protein SerPl Elph-Mcnc     Not Observed g/dL 0.5 (H) 0.2 (H)  Globulin, Total     2.2 - 3.9 g/dL 2.8 2.5  Albumin/Glob SerPl     0.7 - 1.7 1.4 1.5  IFE 1      Comment Comment  Please Note (HCV):      Comment Comment   05/08/18 Left Neck Thyroid Cartilage Biopsy:        RADIOGRAPHIC STUDIES: I have personally reviewed the radiological images as listed and agreed with the findings in the report. Dg Lumbar Spine Complete  Result Date: 10/15/2018 CLINICAL DATA:  Low back pain for 4 days since hanging Christmas decorations. The patient has a history of multiple myeloma. EXAM: LUMBAR SPINE - COMPLETE 4+ VIEW COMPARISON:  None. FINDINGS: Vertebral body height and alignment are maintained. Marked loss of disc space height and endplate spurring are seen at L3-4, L4-5 and L5-S1. Facet degenerative disease is also present at these levels. Known myeloma is deposits are not visible on plain films. Paraspinous structures demonstrate atherosclerosis. IMPRESSION: No acute abnormality. Advanced appearing degenerative disease L3-4, L4-5 and L5-S1. Known myeloma is deposits in the spine are not visible on plain films. Electronically Signed   By: Inge Rise M.D.   On: 10/15/2018 15:30   Dg Pelvis 1-2 Views  Result Date: 10/15/2018 CLINICAL DATA:  Low back pain for 4 days since hanging Christmas decorations. History of multiple myeloma. EXAM: PELVIS - 1-2 VIEW COMPARISON:  None. FINDINGS: There is no evidence of pelvic fracture or diastasis. No pelvic bone lesions are seen. No lytic or sclerotic lesions are identified. There is some degenerative change about the SI joints. IMPRESSION: No acute or focal abnormality. No lytic lesions are identified on this exam. Bilateral SI joint osteoarthritis. Electronically Signed   By: Inge Rise M.D.   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 Bone marrow biopsy apparently showed 50% kappa restricted plasma cells with an initial M spike of 3.4 g/dL with evidence of lytic lesions on his PET/CT scan including right rib fracture and lesions of the lumbar and cervical  spine. -Patient was treated under protocol 11-C-0221 with from 02/21/2013 with  Carfilzomib/Revlimid/Dexamethasone for a total of 8 cycles. He reports that his stem cells were collected and stored after 4 cycles -Posttreatment bone marrow biopsy showed less than 5% plasma cells with a negative flow cytometry and improvement in his previously PET avid lesions. Some concern for new focus of activity at C2 lytic lesion at L4 and several small lesions throughout the vertebrae. -Patient was on maintenance lenalidomide 10 mg by mouth daily for 2 years or more until end of 2014. 08/19/2013 - bone marrow biopsy showed normocellular marrow with less than 5% plasma cells. PET CT scan showed decreased focal metabolic ligament prior rib lesions and no new lesions. Flow cytometry was negative for residual disease. Serum and urine electrophoresis and IFE showed no evidence of monoclonal protein. -Patient notes that he was off protocol for a few years due to lack of clinical trial research funding. -Patient notes that he is back on a follow-up protocol and continues to follow and receive his primary treatment at [facility] at this time.  -Patient's labs show minimal IgG kappa protein on IFE and a CT chest in May 2018 that did not show any overt signs of myeloma progression in the bones.  Rpt Myeloma labs 03/2017 - M spike of 0.3g/dl with a repeat M spike stable at 0.3 g/dL on 05/23/2017 which remains stable on labs today 07/25/17 Serum kappa lambda free light chain ratio not significantly changed. PET/CT scan showed a single metabolically active sternal lesion without any overt bony destruction. Bone  marrow biopsy done on 06/06/2017 - shows no overt involvement with multiple myeloma.  04/24/18 CT Soft Tissue Neck revealed Left laryngeal 3 cm soft tissue mass appears to have originated within and is expanding the left thyroid cartilage. This is new since the August 2018 PET-CT, and there is absent lymphadenopathy. 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 Primary cartilaginous neoplasm, squamous cell carcinoma, and other differential considerations are less likely. Superimposed widespread multiple myeloma lesions in the visible skeleton.  05/08/18 Left neck thyroid cartilage biopsy revealed Plasma cell neoplasm.  05/22/18 PET/CT revealed Soft tissue mass centered around the destroyed left thyroid cartilage is hypermetabolic and consistent with known plasmacytoma. No adenopathy in the neck. 2. Hypermetabolic 3 cm cutaneous and subcutaneous soft tissue mass involving the posterior upper thorax suspicious for cutaneous manifestation of myeloma (plasmacytoma). 3. Persistent and slightly progressive hypermetabolic sternal lesion. 4. Diffuse lytic myelomatous lesions throughout the bony structures but no other hypermetabolic foci. 5. Focus of hypermetabolism in the left aspect of the lower prostate gland, not present on the prior study. This could be an area of infection/inflammation or potential prostate cancer. Recommend correlation with physical examination and PSA level.    Completed RT between 06/04/18 and 06/15/18 of 25 Gy by 10 fractions   2) Elevated bilirubin level . Repeat labs today show normalization of his bilirubin levels after switching to a three-week on one-week off regimen with the Revlimid.  3) PET/CT abnormality in the Prostate. PSA levels WNL -Primary care physician to consider urology referral  4) Abnormal focus of FDG avid at a spot in the liver - will need to be monitor on f/u scans. No pain or other symptoms at this time.  PLAN:   -Discussed pt  labwork today, 10/26/18; Sed rate at 98, mild anemia with HGB at 11.4, LDH normal at 167. Hypercalcemia at 12.7.  -The pt pursued a clinical trial at the [facility]  with RT and immunotherapy in October 2019 through October 18, 2018 with further information available at [email or web address]  -Reviewed [facility] outside labs from 10/18/18 which indicated M Protein increased to 3.5g, from 0.7g at our last visit on 07/10/18. -Reviewed [facility] outside PET/CT from 10/02/18 which indicated new hypermetabolism in left clavicle, left scapula, bilateral ribs, spine, and left proximal femur -Reviewed [facility] outside MRI from 10/18/18 which indicated a pathologic fracture at T12 -Recommended that the pt not bend, pull, push or lift objects to protect his T12 fracture from further damage.  -Recommend Thoraccolumbar brace if possible  -Will refer the pt to IR for consideration of a vertebroplasty -Will resume Zometa today -Will reach out to Dr. Berdine Addison at the [facility] for imaging scans for our interventional radiologist's review for consideration of vertebroplasty -Consume at least 60oz of water each day -Will begin 7 day course of Dexamethasone. Monitor blood sugars, pt will let me know if sugars exceed 250. -Continue Percocet every 4-6 hours as needed -Senna S and Miralax -Resume Acyclovir -Will tentatively begin C1 Daratumumab and Dexamethasone in one week. Will add either Revlimid or Carfilzomib later, after initial tolerance is displayed. -Sending supportive medications to pharmacy -Discussed Port placement- pt would like to wait on this -Recommended salt and baking soda mouthwashes -Will see the pt back in one week   Zometa today and q4weeks - plz schedule 4 doses Schedule to start Daratumumab in 1 week with labs and MD visit (please schedule 1st cycle weekly x 4 doses) Chemo-counseling for Daratumumab in 3-4 days IR referral for consideration of ablation and vertebroplasty for T12 pathologic fracture from  myeloma   All of the patients questions were answered with apparent satisfaction. The patient knows to call the clinic with any problems, questions or concerns.  The total time spent in the appt was 60 minutes and more than 50% was on counseling and direct patient cares.     Sullivan Lone MD [place] AAHIVMS [facility] Hematology/Oncology Physician [facility]  (Office):       [phone] (Work cell):  [phone] (Fax):           [phone]  I, Baldwin Jamaica, am acting as a scribe for Dr. Sullivan Lone.   .I have reviewed the above documentation for accuracy and completeness, and I agree with the above. [name] MD

## 2018-10-26 ENCOUNTER — Inpatient Hospital Stay: Payer: Medicare Other | Attending: Hematology | Admitting: Hematology

## 2018-10-26 ENCOUNTER — Telehealth: Payer: Self-pay

## 2018-10-26 ENCOUNTER — Inpatient Hospital Stay: Payer: Medicare Other

## 2018-10-26 VITALS — BP 132/75 | HR 75 | Temp 98.1°F | Resp 18 | Ht 73.0 in | Wt 239.5 lb

## 2018-10-26 DIAGNOSIS — C9000 Multiple myeloma not having achieved remission: Secondary | ICD-10-CM

## 2018-10-26 DIAGNOSIS — C9002 Multiple myeloma in relapse: Secondary | ICD-10-CM

## 2018-10-26 DIAGNOSIS — Z7189 Other specified counseling: Secondary | ICD-10-CM | POA: Diagnosis not present

## 2018-10-26 DIAGNOSIS — G893 Neoplasm related pain (acute) (chronic): Secondary | ICD-10-CM | POA: Insufficient documentation

## 2018-10-26 LAB — CMP (CANCER CENTER ONLY)
ALT: 25 U/L (ref 0–44)
AST: 23 U/L (ref 15–41)
Albumin: 3.5 g/dL (ref 3.5–5.0)
Alkaline Phosphatase: 126 U/L (ref 38–126)
Anion gap: 5 (ref 5–15)
BUN: 11 mg/dL (ref 8–23)
CO2: 27 mmol/L (ref 22–32)
Calcium: 12.7 mg/dL — ABNORMAL HIGH (ref 8.9–10.3)
Chloride: 103 mmol/L (ref 98–111)
Creatinine: 1.03 mg/dL (ref 0.61–1.24)
GFR, Est AFR Am: >60 mL/min (ref >60)
Glucose, Bld: 104 mg/dL — ABNORMAL HIGH (ref 70–99)
Potassium: 4.1 mmol/L (ref 3.5–5.1)
Sodium: 135 mmol/L (ref 135–145)
Total Bilirubin: 0.8 mg/dL (ref 0.3–1.2)
Total Protein: 10.5 g/dL — ABNORMAL HIGH (ref 6.5–8.1)

## 2018-10-26 LAB — CBC WITH DIFFERENTIAL (CANCER CENTER ONLY)
Abs Immature Granulocytes: 0.25 10*3/uL — ABNORMAL HIGH (ref 0.00–0.07)
Basophils Absolute: 0 10*3/uL (ref 0.0–0.1)
Basophils Relative: 1 %
Eosinophils Absolute: 0 10*3/uL (ref 0.0–0.5)
Eosinophils Relative: 1 %
HCT: 33.8 % — ABNORMAL LOW (ref 39.0–52.0)
Hemoglobin: 11.4 g/dL — ABNORMAL LOW (ref 13.0–17.0)
Immature Granulocytes: 8 %
LYMPHS ABS: 0.6 10*3/uL — AB (ref 0.7–4.0)
Lymphocytes Relative: 19 %
MCH: 32.2 pg (ref 26.0–34.0)
MCHC: 33.7 g/dL (ref 30.0–36.0)
MCV: 95.5 fL (ref 80.0–100.0)
Monocytes Absolute: 0.5 10*3/uL (ref 0.1–1.0)
Monocytes Relative: 15 %
NRBC: 0 % (ref 0.0–0.2)
Neutro Abs: 1.8 10*3/uL (ref 1.7–7.7)
Neutrophils Relative %: 56 %
Platelet Count: 194 10*3/uL (ref 150–400)
RBC: 3.54 MIL/uL — ABNORMAL LOW (ref 4.22–5.81)
RDW: 14.6 % (ref 11.5–15.5)
WBC Count: 3.2 10*3/uL — ABNORMAL LOW (ref 4.0–10.5)

## 2018-10-26 LAB — SEDIMENTATION RATE: Sed Rate: 98 mm/hr — ABNORMAL HIGH (ref 0–16)

## 2018-10-26 LAB — LACTATE DEHYDROGENASE: LDH: 167 U/L (ref 98–192)

## 2018-10-26 MED ORDER — SODIUM CHLORIDE 0.9 % IV SOLN
INTRAVENOUS | Status: DC
  Administered 2018-10-26: 14:00:00 via INTRAVENOUS
  Filled 2018-10-26: qty 250

## 2018-10-26 MED ORDER — ZOLEDRONIC ACID 4 MG/100ML IV SOLN
4.0000 mg | Freq: Once | INTRAVENOUS | Status: DC
  Filled 2018-10-26: qty 100

## 2018-10-26 MED ORDER — ZOLEDRONIC ACID 4 MG/100ML IV SOLN
4.0000 mg | Freq: Once | INTRAVENOUS | Status: AC
  Administered 2018-10-26: 4 mg via INTRAVENOUS
  Filled 2018-10-26: qty 100

## 2018-10-26 MED ORDER — SODIUM CHLORIDE 0.9 % IV SOLN
20.0000 mg | Freq: Once | INTRAVENOUS | Status: AC
  Administered 2018-10-26: 20 mg via INTRAVENOUS
  Filled 2018-10-26: qty 2

## 2018-10-26 NOTE — Patient Instructions (Signed)

## 2018-10-26 NOTE — Telephone Encounter (Signed)
Printed avs and calender of upcoming appointment . Per 1/10 los inf. Added to [facility]

## 2018-10-26 NOTE — Progress Notes (Signed)
Per Dr. Irene Limbo: Patient to receive Zometa today

## 2018-10-26 NOTE — Patient Instructions (Signed)
Thank you for choosing [facility] to provide your oncology and hematology care.  To afford each patient quality time with our providers, please arrive 30 minutes before your scheduled appointment time.  If you arrive late for your appointment, you may be asked to reschedule.  We strive to give you quality time with our providers, and arriving late affects you and other patients whose appointments are after yours.    If you are a no show for multiple scheduled visits, you may be dismissed from the clinic at the providers discretion.     Again, thank you for choosing [facility], our hope is that these requests will decrease the amount of time that you wait before being seen by our physicians.  ______________________________________________________________________   Should you have questions after your visit to the [facility], please contact our office at [phone] between the hours of 8:30 and 4:30 p.m.    Voicemails left after 4:30p.m will not be returned until the following business day.     For prescription refill requests, please have your pharmacy contact us directly.  Please also try to allow 48 hours for prescription requests.     Please contact the scheduling department for questions regarding scheduling.  For scheduling of procedures such as PET scans, CT scans, MRI, Ultrasound, etc please contact central scheduling at [phone].     Resources For Cancer Patients and Caregivers:    Oncolink.org:  A wonderful resource for patients and healthcare providers for information regarding your disease, ways to tract your treatment, what to expect, etc.      [employer]:  800-227-2345  Can help patients locate various types of support and financial assistance   Cancer Care: 1-800-813-HOPE (4673) Provides financial assistance, online support groups, medication/co-pay assistance.     [place] DSS:  336-641-3447 Where to apply  for food stamps, Medicaid, and utility assistance   Medicare Rights Center: 800-333-4114 Helps people with Medicare understand their rights and benefits, navigate the Medicare system, and secure the quality healthcare they deserve   [employer]: 336-333-6589 [place] [employer]'s shared-ride transportation service for eligible riders who have a disability that prevents them from riding the fixed route bus.     For additional information on assistance programs please contact our social worker:   Abigail Elmore:  [phone]  

## 2018-10-28 DIAGNOSIS — Z7189 Other specified counseling: Secondary | ICD-10-CM | POA: Insufficient documentation

## 2018-10-28 NOTE — Progress Notes (Signed)
START ON PATHWAY REGIMEN - Multiple Myeloma and Other Plasma Cell Dyscrasias     Cycles 1 and 2: A cycle is every 28 days:     Lenalidomide      Dexamethasone      Daratumumab    Cycles 3 through 6: A cycle is every 28 days:     Lenalidomide      Dexamethasone      Dexamethasone      Daratumumab    Cycles 7 and beyond: A cycle is every 28 days:     Lenalidomide      Dexamethasone      Dexamethasone      Daratumumab   **Always confirm dose/schedule in your pharmacy ordering system**  Patient Characteristics: Relapsed / Refractory, Second through Fourth Lines of Therapy R-ISS Staging: Not Applicable Disease Classification: Relapsed Line of Therapy: Second Line Intent of Therapy: Non-Curative / Palliative Intent, Discussed with Patient

## 2018-10-29 ENCOUNTER — Telehealth: Payer: Self-pay | Admitting: *Deleted

## 2018-10-29 LAB — KAPPA/LAMBDA LIGHT CHAINS
KAPPA, LAMDA LIGHT CHAIN RATIO: 28.64 — AB (ref 0.26–1.65)
Kappa free light chain: 111.7 mg/L — ABNORMAL HIGH (ref 3.3–19.4)
Lambda free light chains: 3.9 mg/L — ABNORMAL LOW (ref 5.7–26.3)

## 2018-10-29 NOTE — Telephone Encounter (Signed)
Per Dr. Grier Mitts request, contacted [facility] for copies of most recent scans. Spoke with Aleen Campi, work cell [phone], patient Care Coordinator for Clinical Trials Team.  She states she will request these from medical records and have them sent to Dr. Grier Mitts office.

## 2018-10-30 ENCOUNTER — Inpatient Hospital Stay: Payer: Medicare Other

## 2018-10-30 LAB — MULTIPLE MYELOMA PANEL, SERUM
ALBUMIN SERPL ELPH-MCNC: 3.6 g/dL (ref 2.9–4.4)
Albumin/Glob SerPl: 0.6 — ABNORMAL LOW (ref 0.7–1.7)
Alpha 1: 0.4 g/dL (ref 0.0–0.4)
Alpha2 Glob SerPl Elph-Mcnc: 1 g/dL (ref 0.4–1.0)
B-Globulin SerPl Elph-Mcnc: 1.1 g/dL (ref 0.7–1.3)
Gamma Glob SerPl Elph-Mcnc: 3.6 g/dL — ABNORMAL HIGH (ref 0.4–1.8)
Globulin, Total: 6.1 g/dL — ABNORMAL HIGH (ref 2.2–3.9)
IgA: 70 mg/dL (ref 61–437)
IgG (Immunoglobin G), Serum: 4787 mg/dL — ABNORMAL HIGH (ref 700–1600)
IgM (Immunoglobulin M), Srm: 13 mg/dL — ABNORMAL LOW (ref 20–172)
M Protein SerPl Elph-Mcnc: 3.4 g/dL — ABNORMAL HIGH
Total Protein ELP: 9.7 g/dL — ABNORMAL HIGH (ref 6.0–8.5)

## 2018-11-02 ENCOUNTER — Telehealth: Payer: Self-pay

## 2018-11-02 ENCOUNTER — Other Ambulatory Visit (HOSPITAL_COMMUNITY): Payer: Self-pay | Admitting: Hematology

## 2018-11-02 ENCOUNTER — Ambulatory Visit
Admission: RE | Admit: 2018-11-02 | Discharge: 2018-11-02 | Disposition: A | Discharge status: Home / Self Care | Payer: Self-pay | Source: Ambulatory Visit | Attending: Hematology | Admitting: Hematology

## 2018-11-02 ENCOUNTER — Inpatient Hospital Stay
Admission: RE | Admit: 2018-11-02 | Discharge: 2018-11-02 | Disposition: A | Discharge status: Home / Self Care | Payer: Self-pay | Source: Ambulatory Visit | Attending: Hematology | Admitting: Hematology

## 2018-11-02 DIAGNOSIS — C801 Malignant (primary) neoplasm, unspecified: Secondary | ICD-10-CM

## 2018-11-02 DIAGNOSIS — C9000 Multiple myeloma not having achieved remission: Secondary | ICD-10-CM

## 2018-11-02 NOTE — Telephone Encounter (Signed)
Spoke with patient wife concerning his upcoming appointment. Per 1/15 los. Will stop by scheduling dept for a copy of new calender

## 2018-11-02 NOTE — Telephone Encounter (Signed)
Per Dr. Irene Limbo imaging disk taken to Radiology and left for [facility] to scan into [facility].

## 2018-11-05 NOTE — Progress Notes (Signed)
[name]    HEMATOLOGY/ONCOLOGY CLINIC NOTE  Date of Service: 11/06/18     Patient Care Team: [name], MD as PCP - General (Family Medicine) [name], MD as Consulting Physician (Hematology) [name], MD as Attending Physician (Radiation Oncology) [name], RN as Oncology Nurse Navigator (Oncology) [name] MD ([facility] _ primary oncology) phone number [phone] Email- [email or web address]  CHIEF COMPLAINTS  followup of multiple myeloma.  HISTORY OF PRESENTING ILLNESS:   Michael [name] is a wonderful 68 y.o. 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 Patient is currently on a study protocol and is being monitored for myeloma recurrence.  Based on available records being put together  -Patient was diagnosed with IgG kappa ISS stage I multiple myeloma with hyperdiploid complex cytogenetics in 2012. Bone marrow biopsy apparently showed 50% kappa restricted plasma cells with an initial M spike of 3.4 g/dL with evidence of lytic lesions on his PET/CT scan including right rib fracture and lesions of the lumbar and cervical spine. -Patient was treated under protocol 11-C-0221 with from 02/21/2013 with  Carfilzomib/Revlimid/Dexamethasone for a total of 8 cycles. He reports that his stem cells were collected and stored after 4 cycles -Posttreatment bone marrow biopsy showed less than 5% plasma cells with a negative flow cytometry and improvement in his previously PET avid lesions. Some concern for new focus of activity at C2 lytic lesion at L4 and several small lesions throughout the vertebrae. -Patient was on maintenance lenalidomide 10 mg by  mouth daily for 2 years or more until end of 2014. 08/19/2013 - bone marrow biopsy showed normocellular marrow with less than 5% plasma cells. PET CT scan showed decreased focal metabolic ligament prior rib lesions and no new lesions. Flow cytometry was negative for residual disease. Serum and urine electrophoresis and IFE showed no evidence of monoclonal protein. -Patient notes that he was off protocol for a few years due to lack of clinical trial research funding. -Patient notes that he is back on a follow-up protocol and continues to follow and receive his primary treatment at [facility] at this time. -Patient reports he had his last PET CT scan blood and urine tests and bone marrow examination in February 2018. The results of which are not available to [place] currently.  He notes that there was concern for a very slowly growing sternal FDG avid lesion that has been present for years. He has a follow-up at [facility] next month to determine the management of this lesion. He reports that was some consideration of considering radiation. he notes that this lesion has not been biopsied. No other focal bone pains at this time.  We discussed in detail about what our role will be and noted that we shall be available for any acute issues that need to be taken care of locally but that at this time the primary treatment and evaluation will be driven by his team at [facility] as per their research protocols and input. The patient and his wife are clearly aware of this and are in agreement with this plan.  INTERVAL HISTORY  Michael [name] comes is here for continued management of his Multiple Myeloma and recently diagnosed Plasmacytoma. The patient's last visit with [place]  was on 10/26/18. He is accompanied today by his wife. The pt reports that he is doing well overall.  The pt reports that his pain is increasing in his ribs and back. He notes that he is having some new rib pain on the left side. The pt is not using a back  brace at this time. He takes his pain medication once at night. He is using some warm compresses on his back as well.  The pt notes that he is moving his bowels and is taking Miralax and is eating prunes.   Lab results today (11/06/18) of CBC w/diff and CMP is as follows: all values are WNL except for WBC at 3.7k, RBC at 3.43, HGB at 10.9, HCT at 32.2, nRBC at 0.5%, Lymphs abs at 600, Abs immature granulocytes at 0.16k, Sodium at 133, Glucose at 104, Calcium at 11.3, Total Protein at 10.9. 11/06/18 SFLC and MMP are pending   On review of systems, pt reports back pain, stable right rib pain, new left rib pain, moving his bowels, staying hydrated, and denies leg swelling, abdominal pains, and any other symptoms.    MEDICAL HISTORY:  Past Medical History:  Diagnosis Date  . Angioedema   . CPAP (continuous positive airway pressure) dependence    uses at night  . Gastroesophageal reflux disease without esophagitis   . History of radiation therapy 06/04/18- 06/15/18   25 Gy in 10 fractions to the laryngeal/ cartilage lesion  . Hyperlipidemia   . Multiple myeloma ([facility]) 2012   Treated at [facility]  . Non morbid obesity due to excess calories   . Prediabetes   . Prediabetes   . Unspecified glaucoma(365.9)   . Urticaria, unspecified   Diabetes mellitus Glaucoma Left toes neuropathy  GERD  SURGICAL HISTORY: Past Surgical History:  Procedure Laterality Date  . PORTACATH PLACEMENT    . PORTACATH PLACEMENT     removed 02/2016    SOCIAL HISTORY: Social History   Socioeconomic History  . Marital status: Married    Spouse name: Not on file  . Number of children: 2  . Years of education: Not on file  . Highest education level: Not on file  Occupational History  . Occupation: Retired Chief Strategy Officer for [employer]  . Financial resource strain: Not on file  . Food insecurity:    Worry: Not on file    Inability: Not on file  . Transportation needs:    Medical: Not on file     Non-medical: Not on file  Tobacco Use  . Smoking status: Never Smoker  . Smokeless tobacco: Never Used  Substance and Sexual Activity  . Alcohol use: Yes    Alcohol/week: 0.0 standard drinks    Comment: Occasional beer  . Drug use: No  . Sexual activity: Not on file  Lifestyle  . Physical activity:    Days per week: Not on file    Minutes per session: Not on file  . Stress: Not on file  Relationships  . Social connections:    Talks on phone: Not on file    Gets together: Not on file    Attends religious service: Not on file    Active member of club or organization: Not on file    Attends meetings of clubs or organizations: Not on file    Relationship status: Not on file  . Intimate partner violence:    Fear of current or ex partner: No    Emotionally abused: No  Physically abused: No    Forced sexual activity: No  Other Topics Concern  . Not on file  Social History Narrative   Unable to [employer] partner violence- partner in room   Never smoker Married Retired from the [employer] in June 2017 and moved to [facility]  FAMILY HISTORY: Family History  Problem Relation Age of Onset  . Cancer Father     ALLERGIES:  has No Known Allergies.  MEDICATIONS:  Current Outpatient Medications  Medication Sig Dispense Refill  . aspirin 81 MG tablet Take 81 mg by mouth daily.    [name] atorvastatin (LIPITOR) 20 MG tablet Take 20 mg by mouth every evening.     . cyclobenzaprine (FLEXERIL) 5 MG tablet Take 5 mg by mouth 3 (three) times daily as needed for muscle spasms.    . Lido-Capsaicin-Men-Methyl Sal (MEDI-PATCH-LIDOCAINE) 0.5-0.035-5-20 % PTCH Apply 1 patch topically 2 (two) times daily as needed. 30 patch 0  . Multiple Vitamin (MULTIVITAMIN) tablet Take 1 tablet by mouth daily.    . naproxen sodium (ALEVE) 220 MG tablet Take 440 mg by mouth every 12 (twelve) hours.    [name] nystatin (MYCOSTATIN/NYSTOP) powder Apply 100,000 g topically daily as needed.    .  pantoprazole (PROTONIX) 40 MG tablet Take 40 mg by mouth daily.     . polyethylene glycol (MIRALAX) packet Take 17 g by mouth daily.    Orlie Dakin SODIUM PO Take 1 tablet by mouth daily. Takes 50 mg    . diazepam (VALIUM) 5 MG tablet Take 1 tablet (5 mg total) by mouth every 6 (six) hours as needed for muscle spasms. (Patient not taking: Reported on 11/06/2018) 10 tablet 0  . lenalidomide (REVLIMID) 15 MG capsule Take 1 capsule (15 mg total) by mouth daily. Take days 1-21 every 28 days. Josem Kaufmann #[id number] 08/14/18 (Patient not taking: Reported on 10/26/2018) 21 capsule 0  . oxyCODONE-acetaminophen (PERCOCET) 5-325 MG tablet Take 2 tablets by mouth every 4 (four) hours as needed. (Patient not taking: Reported on 11/06/2018) 20 tablet 0  . sodium chloride 0.9 % SOLN 250 mL with avelumab 200 MG/10ML SOLN 10 mg/kg Inject 800 mg/kg into the vein every 14 (fourteen) days.     No current facility-administered medications for this visit.     REVIEW OF SYSTEMS:    A 10+ POINT REVIEW OF SYSTEMS WAS OBTAINED including neurology, dermatology, psychiatry, cardiac, respiratory, lymph, extremities, GI, GU, Musculoskeletal, constitutional, breasts, reproductive, HEENT.  All pertinent positives are noted in the HPI.  All others are negative.   PHYSICAL EXAMINATION: ECOG PERFORMANCE STATUS: 1 - Symptomatic but completely ambulatory  Vitals:   11/06/18 1355  BP: 131/62  Pulse: 85  Resp: 18  Temp: 98 F (36.7 C)  TempSrc: Oral  SpO2: 99%  Weight: 235 lb 8 oz (106.8 kg)  Height: 6' 1"  (1.854 m)   Body mass index is 31.07 kg/m.   GENERAL:alert, in no acute distress and comfortable SKIN: no acute rashes, no significant lesions EYES: conjunctiva are pink and non-injected, sclera anicteric OROPHARYNX: MMM, no exudates, no oropharyngeal erythema or ulceration NECK: supple, no JVD LYMPH:  no palpable lymphadenopathy in the cervical, axillary or inguinal regions LUNGS: clear to auscultation b/l with  normal respiratory effort HEART: regular rate & rhythm ABDOMEN:  normoactive bowel sounds , non tender, not distended. No palpable hepatosplenomegaly.  Extremity: no pedal edema PSYCH: alert & oriented x 3 with fluent speech NEURO: no focal motor/sensory deficits   LABORATORY DATA:  I  have reviewed the data as listed  . CBC Latest Ref Rng & Units 11/06/2018 10/26/2018 07/10/2018  WBC 4.0 - 10.5 K/uL 3.7(L) 3.2(L) 3.6(L)  Hemoglobin 13.0 - 17.0 g/dL 10.9(L) 11.4(L) 14.3  Hematocrit 39.0 - 52.0 % 32.2(L) 33.8(L) 41.2  Platelets 150 - 400 K/uL 186 194 132(L)  ANC 1200  . CMP Latest Ref Rng & Units 11/06/2018 10/26/2018 07/10/2018  Glucose 70 - 99 mg/dL 104(H) 104(H) 133(H)  BUN 8 - 23 mg/dL 13 11 8   Creatinine 0.61 - 1.24 mg/dL 1.07 1.03 0.82  Sodium 135 - 145 mmol/L 133(L) 135 141  Potassium 3.5 - 5.1 mmol/L 4.3 4.1 3.8  Chloride 98 - 111 mmol/L 104 103 105  CO2 22 - 32 mmol/L 23 27 28   Calcium 8.9 - 10.3 mg/dL 11.3(H) 12.7(H) 9.0  Total Protein 6.5 - 8.1 g/dL 10.9(H) 10.5(H) 7.2  Total Bilirubin 0.3 - 1.2 mg/dL 0.9 0.8 1.4(H)  Alkaline Phos 38 - 126 U/L 108 126 70  AST 15 - 41 U/L 22 23 19   ALT 0 - 44 U/L 15 25 29    Component     Latest Ref Rng & Units 05/23/2017 07/25/2017 10/03/2017 11/24/2017  IgG (Immunoglobin G), Serum     700 - 1,600 mg/dL 942 934 1,026 1,068  IgA     61 - 437 mg/dL    121  IgM (Immunoglobulin M), Srm     20 - 172 mg/dL    30  Total Protein ELP     6.0 - 8.5 g/dL    6.8  Albumin SerPl Elph-Mcnc     2.9 - 4.4 g/dL 4.0 3.6 4.1 4.0  Alpha 1     0.0 - 0.4 g/dL 0.2 0.2 0.2 0.2  Alpha2 Glob SerPl Elph-Mcnc     0.4 - 1.0 g/dL 0.6 0.7 0.6 0.6  B-Globulin SerPl Elph-Mcnc     0.7 - 1.3 g/dL 1.0 1.1 1.0 1.0  Gamma Glob SerPl Elph-Mcnc     0.4 - 1.8 g/dL 1.0 1.2 1.1 0.9  M Protein SerPl Elph-Mcnc     Not Observed g/dL 0.3 (H) 0.3 (H) 0.4 (H) 0.7 (H)  Globulin, Total     2.2 - 3.9 g/dL 2.9 3.1 2.9 2.8  Albumin/Glob SerPl     0.7 - 1.7 1.4 1.2 1.5 1.5  IFE 1       Comment Comment Comment Comment  Please Note (HCV):      Comment Comment Comment Comment   Component     Latest Ref Rng & Units 01/19/2018 03/15/2018  IgG (Immunoglobin G), Serum     700 - 1,600 mg/dL 1,035 991  IgA     61 - 437 mg/dL 122 121  IgM (Immunoglobulin M), Srm     20 - 172 mg/dL 24 32  Total Protein ELP     6.0 - 8.5 g/dL 6.5 6.2  Albumin SerPl Elph-Mcnc     2.9 - 4.4 g/dL 3.7 3.7  Alpha 1     0.0 - 0.4 g/dL 0.2 0.2  Alpha2 Glob SerPl Elph-Mcnc     0.4 - 1.0 g/dL 0.6 0.5  B-Globulin SerPl Elph-Mcnc     0.7 - 1.3 g/dL 1.1 0.9  Gamma Glob SerPl Elph-Mcnc     0.4 - 1.8 g/dL 1.0 0.9  M Protein SerPl Elph-Mcnc     Not Observed g/dL 0.5 (H) 0.2 (H)  Globulin, Total     2.2 - 3.9 g/dL 2.8 2.5  Albumin/Glob SerPl  0.7 - 1.7 1.4 1.5  IFE 1      Comment Comment  Please Note (HCV):      Comment Comment   05/08/18 Left Neck Thyroid Cartilage Biopsy:        RADIOGRAPHIC STUDIES: I have personally reviewed the radiological images as listed and agreed with the findings in the report. Dg Lumbar Spine Complete  Result Date: 10/15/2018 CLINICAL DATA:  Low back pain for 4 days since hanging Christmas decorations. The patient has a history of multiple myeloma. EXAM: LUMBAR SPINE - COMPLETE 4+ VIEW COMPARISON:  None. FINDINGS: Vertebral body height and alignment are maintained. Marked loss of disc space height and endplate spurring are seen at L3-4, L4-5 and L5-S1. Facet degenerative disease is also present at these levels. Known myeloma is deposits are not visible on plain films. Paraspinous structures demonstrate atherosclerosis. IMPRESSION: No acute abnormality. Advanced appearing degenerative disease L3-4, L4-5 and L5-S1. Known myeloma is deposits in the spine are not visible on plain films. Electronically Signed   By: Inge Rise M.D.   On: 10/15/2018 15:30   Dg Pelvis 1-2 Views  Result Date: 10/15/2018 CLINICAL DATA:  Low back pain for 4 days since hanging  Christmas decorations. History of multiple myeloma. EXAM: PELVIS - 1-2 VIEW COMPARISON:  None. FINDINGS: There is no evidence of pelvic fracture or diastasis. No pelvic bone lesions are seen. No lytic or sclerotic lesions are identified. There is some degenerative change about the SI joints. IMPRESSION: No acute or focal abnormality. No lytic lesions are identified on this exam. Bilateral SI joint osteoarthritis. Electronically Signed   By: Inge Rise M.D.   On: 10/15/2018 15:33   Mr Outside Films Spine  Result Date: 11/02/2018 This examination belongs to an outside facility and is stored here for comparison purposes only.  Contact the originating outside institution for any associated report or interpretation.   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 Bone marrow biopsy apparently showed 50% kappa restricted plasma cells with an initial M spike of 3.4 g/dL with evidence of lytic lesions on his PET/CT scan including right rib fracture and lesions of the lumbar and cervical spine. -Patient was treated under protocol 11-C-0221 with from 02/21/2013 with  Carfilzomib/Revlimid/Dexamethasone for a total of 8 cycles. He reports that his stem cells were collected and stored after 4 cycles -Posttreatment bone marrow biopsy showed less than 5% plasma cells with a negative flow cytometry and improvement in his previously PET avid lesions. Some concern for new focus of activity at C2 lytic lesion at L4 and several small lesions throughout the vertebrae. -Patient was on maintenance lenalidomide 10 mg by mouth daily for 2 years or more until end of 2014. 08/19/2013 - bone marrow biopsy showed normocellular marrow with less than 5% plasma cells. PET CT scan showed decreased focal metabolic ligament prior rib lesions and no new lesions. Flow  cytometry was negative for residual disease. Serum and urine electrophoresis and IFE showed no evidence of monoclonal protein. -Patient notes that he was off protocol for a few years due to lack of clinical trial research funding. -Patient notes that he is back on a follow-up protocol and continues to follow and receive his primary treatment at [facility] at this time.  -Patient's labs show minimal IgG kappa protein on IFE and a CT chest in May 2018  that did not show any overt signs of myeloma progression in the bones.  Rpt Myeloma labs 03/2017 - M spike of 0.3g/dl with a repeat M spike stable at 0.3 g/dL on 05/23/2017 which remains stable on labs today 07/25/17 Serum kappa lambda free light chain ratio not significantly changed. PET/CT scan showed a single metabolically active sternal lesion without any overt bony destruction. Bone marrow biopsy done on 06/06/2017 - shows no overt involvement with multiple myeloma.  04/24/18 CT Soft Tissue Neck revealed Left laryngeal 3 cm soft tissue mass appears to have originated within and is expanding the left thyroid cartilage. This is new since the August 2018 PET-CT, and there is absent lymphadenopathy. 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 Primary cartilaginous neoplasm, squamous cell carcinoma, and other differential considerations are less likely. Superimposed widespread multiple myeloma lesions in the visible skeleton.  05/08/18 Left neck thyroid cartilage biopsy revealed Plasma cell neoplasm.  05/22/18 PET/CT revealed Soft tissue mass centered around the destroyed left thyroid cartilage is hypermetabolic and consistent with known plasmacytoma. No adenopathy in the neck. 2. Hypermetabolic 3 cm cutaneous and subcutaneous soft tissue mass involving the posterior upper thorax suspicious for cutaneous manifestation of myeloma (plasmacytoma). 3. Persistent and slightly progressive hypermetabolic sternal lesion. 4. Diffuse lytic  myelomatous lesions throughout the bony structures but no other hypermetabolic foci. 5. Focus of hypermetabolism in the left aspect of the lower prostate gland, not present on the prior study. This could be an area of infection/inflammation or potential prostate cancer. Recommend correlation with physical examination and PSA level.    Completed RT between 06/04/18 and 06/15/18 of 25 Gy by 10 fractions  The pt pursued a clinical trial at the [facility] with RT and immunotherapy in October 2019 through October 18, 2018 with further information available at [email or web address]  10/18/18 M Protein increased to 3.5g, from 0.7g at our last visit on 07/10/18. 10/02/18 PET/CT from [facility] indicated new hypermetabolism in left clavicle, left scapula, bilateral ribs, spine, and left proximal femur 10/18/18 MRI from [facility] ndicated a pathologic fracture at T12   2) Elevated bilirubin level . Repeat labs today show normalization of his bilirubin levels after switching to a three-week on one-week off regimen with the Revlimid.  3) PET/CT abnormality in the Prostate. PSA levels WNL -Primary care physician to consider urology referral  4) Abnormal focus of FDG avid at a spot in the liver - will need to be monitor on f/u scans. No pain or other symptoms at this time.  PLAN:   -Discussed pt labwork today, 11/06/18; calcium improved to 11.3 down from 12.7, HGB at 10.9, other blood counts are stable  -Will provide outside records to IR for treatment planning and consideration of vertebroplasty for T12 -Will look to add on Revlimid or Carfilzomib after one month of displayed tolerance to Daratumumab and Dexamethasone  -The pt has no prohibitive toxicities from beginning C1 Daratumumab and Dexamethasone on 11/08/18, at this time.   -Two tablets Senna S every night  -Continue 5-325 Percocet every 4-6 hours -Flexeril prn for muscle relaxation -Recommended that the pt not bend, pull, push or lift objects to  protect his T12 fracture from further damage. -Continue Zometa every 4 weeks  -Consume at least 60oz of water each day -Continue Percocet every 4-6 hours as needed -Senna S and Miralax -Resume Acyclovir -Discussed Port placement- pt would like to wait on this -Recommended salt and baking soda mouthwashes -Will see the pt back in one week  Additional labs today RBC phenotype IR consultation for consideration of T12 vertebroplasty F/u for treatment as scheduled on 11/08/2018 RTC with Dr Irene Limbo with labs and 2nd treatment on 1/29   All of the patients questions were answered with apparent satisfaction. The patient knows to call the clinic with any problems, questions or concerns.  The total time spent in the appt was 30 minutes and more than 50% was on counseling and direct patient cares.    Sullivan Lone MD [place] AAHIVMS [facility] Hematology/Oncology Physician [facility]  (Office):       [phone] (Work cell):  [phone] (Fax):           [phone]  I, Baldwin Jamaica, am acting as a scribe for Dr. Sullivan Lone.   .I have reviewed the above documentation for accuracy and completeness, and I agree with the above. [name] MD

## 2018-11-06 ENCOUNTER — Telehealth: Payer: Self-pay | Admitting: Hematology

## 2018-11-06 ENCOUNTER — Inpatient Hospital Stay: Payer: Medicare Other

## 2018-11-06 ENCOUNTER — Other Ambulatory Visit: Payer: Self-pay | Admitting: Hematology

## 2018-11-06 ENCOUNTER — Inpatient Hospital Stay (HOSPITAL_BASED_OUTPATIENT_CLINIC_OR_DEPARTMENT_OTHER): Payer: Medicare Other | Admitting: Hematology

## 2018-11-06 ENCOUNTER — Other Ambulatory Visit: Payer: Self-pay | Admitting: *Deleted

## 2018-11-06 VITALS — BP 131/62 | HR 85 | Temp 98.0°F | Resp 18 | Ht 73.0 in | Wt 235.5 lb

## 2018-11-06 DIAGNOSIS — S22080S Wedge compression fracture of T11-T12 vertebra, sequela: Secondary | ICD-10-CM | POA: Diagnosis not present

## 2018-11-06 DIAGNOSIS — C9000 Multiple myeloma not having achieved remission: Secondary | ICD-10-CM

## 2018-11-06 DIAGNOSIS — Z7189 Other specified counseling: Secondary | ICD-10-CM

## 2018-11-06 DIAGNOSIS — C9002 Multiple myeloma in relapse: Secondary | ICD-10-CM | POA: Diagnosis not present

## 2018-11-06 DIAGNOSIS — G893 Neoplasm related pain (acute) (chronic): Secondary | ICD-10-CM | POA: Diagnosis not present

## 2018-11-06 DIAGNOSIS — S22080A Wedge compression fracture of T11-T12 vertebra, initial encounter for closed fracture: Secondary | ICD-10-CM

## 2018-11-06 LAB — CBC WITH DIFFERENTIAL/PLATELET
ABS IMMATURE GRANULOCYTES: 0.16 10*3/uL — AB (ref 0.00–0.07)
Basophils Absolute: 0 10*3/uL (ref 0.0–0.1)
Basophils Relative: 1 %
Eosinophils Absolute: 0.1 10*3/uL (ref 0.0–0.5)
Eosinophils Relative: 1 %
HCT: 32.2 % — ABNORMAL LOW (ref 39.0–52.0)
Hemoglobin: 10.9 g/dL — ABNORMAL LOW (ref 13.0–17.0)
Immature Granulocytes: 4 %
Lymphocytes Relative: 17 %
Lymphs Abs: 0.6 10*3/uL — ABNORMAL LOW (ref 0.7–4.0)
MCH: 31.8 pg (ref 26.0–34.0)
MCHC: 33.9 g/dL (ref 30.0–36.0)
MCV: 93.9 fL (ref 80.0–100.0)
MONO ABS: 0.5 10*3/uL (ref 0.1–1.0)
Monocytes Relative: 13 %
NEUTROS ABS: 2.4 10*3/uL (ref 1.7–7.7)
Neutrophils Relative %: 64 %
Platelets: 186 10*3/uL (ref 150–400)
RBC: 3.43 MIL/uL — ABNORMAL LOW (ref 4.22–5.81)
RDW: 14.8 % (ref 11.5–15.5)
WBC: 3.7 10*3/uL — ABNORMAL LOW (ref 4.0–10.5)
nRBC: 0.5 % — ABNORMAL HIGH (ref 0.0–0.2)

## 2018-11-06 LAB — CMP (CANCER CENTER ONLY)
ALT: 15 U/L (ref 0–44)
AST: 22 U/L (ref 15–41)
Albumin: 3.5 g/dL (ref 3.5–5.0)
Alkaline Phosphatase: 108 U/L (ref 38–126)
Anion gap: 6 (ref 5–15)
BUN: 13 mg/dL (ref 8–23)
CO2: 23 mmol/L (ref 22–32)
Calcium: 11.3 mg/dL — ABNORMAL HIGH (ref 8.9–10.3)
Chloride: 104 mmol/L (ref 98–111)
Creatinine: 1.07 mg/dL (ref 0.61–1.24)
GFR, Est AFR Am: >60 mL/min (ref >60)
GFR, Estimated: >60 mL/min (ref >60)
Glucose, Bld: 104 mg/dL — ABNORMAL HIGH (ref 70–99)
Potassium: 4.3 mmol/L (ref 3.5–5.1)
Sodium: 133 mmol/L — ABNORMAL LOW (ref 135–145)
Total Bilirubin: 0.9 mg/dL (ref 0.3–1.2)
Total Protein: 10.9 g/dL — ABNORMAL HIGH (ref 6.5–8.1)

## 2018-11-06 MED ORDER — OXYCODONE-ACETAMINOPHEN 5-325 MG PO TABS: 1.0000 | ORAL_TABLET | ORAL | 0 refills | Status: DC | PRN

## 2018-11-06 MED ORDER — ONDANSETRON HCL 8 MG PO TABS: 8.0000 mg | ORAL_TABLET | Freq: Two times a day (BID) | ORAL | 1 refills | Status: DC | PRN

## 2018-11-06 MED ORDER — PROCHLORPERAZINE MALEATE 10 MG PO TABS: 10.0000 mg | ORAL_TABLET | Freq: Four times a day (QID) | ORAL | 1 refills | Status: DC | PRN

## 2018-11-06 MED ORDER — ACYCLOVIR 400 MG PO TABS: 400.0000 mg | ORAL_TABLET | Freq: Two times a day (BID) | ORAL | 11 refills | Status: DC

## 2018-11-06 MED ORDER — DEXAMETHASONE 4 MG PO TABS: ORAL_TABLET | ORAL | 4 refills | Status: DC

## 2018-11-06 MED ORDER — CYCLOBENZAPRINE HCL 5 MG PO TABS: 5.0000 mg | ORAL_TABLET | Freq: Three times a day (TID) | ORAL | 0 refills | Status: DC | PRN

## 2018-11-06 NOTE — Telephone Encounter (Signed)
Gave patient avs report and appointments for January and February. [facility] IR will call re IR appointment.

## 2018-11-06 NOTE — Patient Instructions (Signed)
Thank you for choosing [facility] to provide your oncology and hematology care.  To afford each patient quality time with our providers, please arrive 30 minutes before your scheduled appointment time.  If you arrive late for your appointment, you may be asked to reschedule.  We strive to give you quality time with our providers, and arriving late affects you and other patients whose appointments are after yours.    If you are a no show for multiple scheduled visits, you may be dismissed from the clinic at the providers discretion.     Again, thank you for choosing [facility], our hope is that these requests will decrease the amount of time that you wait before being seen by our physicians.  ______________________________________________________________________   Should you have questions after your visit to the [facility], please contact our office at [phone] between the hours of 8:30 and 4:30 p.m.    Voicemails left after 4:30p.m will not be returned until the following business day.     For prescription refill requests, please have your pharmacy contact us directly.  Please also try to allow 48 hours for prescription requests.     Please contact the scheduling department for questions regarding scheduling.  For scheduling of procedures such as PET scans, CT scans, MRI, Ultrasound, etc please contact central scheduling at [phone].     Resources For Cancer Patients and Caregivers:    Oncolink.org:  A wonderful resource for patients and healthcare providers for information regarding your disease, ways to tract your treatment, what to expect, etc.      American Cancer Society:  800-227-2345  Can help patients locate various types of support and financial assistance   Cancer Care: 1-800-813-HOPE (4673) Provides financial assistance, online support groups, medication/co-pay assistance.     [place] DSS:  336-641-3447 Where to apply  for food stamps, Medicaid, and utility assistance   Medicare Rights Center: 800-333-4114 Helps people with Medicare understand their rights and benefits, navigate the Medicare system, and secure the quality healthcare they deserve   SCAT: 336-333-6589 [place] [employer]'s shared-ride transportation service for eligible riders who have a disability that prevents them from riding the fixed route bus.     For additional information on assistance programs please contact our social worker:   Abigail Elmore:  [phone]  

## 2018-11-07 ENCOUNTER — Ambulatory Visit: Payer: Medicare Other

## 2018-11-07 ENCOUNTER — Other Ambulatory Visit: Payer: Medicare Other

## 2018-11-07 ENCOUNTER — Other Ambulatory Visit: Payer: Self-pay | Admitting: Hematology

## 2018-11-07 DIAGNOSIS — C9000 Multiple myeloma not having achieved remission: Secondary | ICD-10-CM

## 2018-11-07 DIAGNOSIS — S22080A Wedge compression fracture of T11-T12 vertebra, initial encounter for closed fracture: Secondary | ICD-10-CM

## 2018-11-07 LAB — TYPE AND SCREEN
ABO/RH(D): A POS
Antibody Screen: NEGATIVE

## 2018-11-07 LAB — MULTIPLE MYELOMA PANEL, SERUM
ALBUMIN SERPL ELPH-MCNC: 3.8 g/dL (ref 2.9–4.4)
Albumin/Glob SerPl: 0.6 — ABNORMAL LOW (ref 0.7–1.7)
Alpha 1: 0.3 g/dL (ref 0.0–0.4)
Alpha2 Glob SerPl Elph-Mcnc: 0.8 g/dL (ref 0.4–1.0)
B-Globulin SerPl Elph-Mcnc: 1 g/dL (ref 0.7–1.3)
GLOBULIN, TOTAL: 6.4 g/dL — AB (ref 2.2–3.9)
Gamma Glob SerPl Elph-Mcnc: 4.3 g/dL — ABNORMAL HIGH (ref 0.4–1.8)
IgA: 58 mg/dL — ABNORMAL LOW (ref 61–437)
IgG (Immunoglobin G), Serum: 5928 mg/dL — ABNORMAL HIGH (ref 700–1600)
IgM (Immunoglobulin M), Srm: 13 mg/dL — ABNORMAL LOW (ref 20–172)
M Protein SerPl Elph-Mcnc: 4.1 g/dL — ABNORMAL HIGH
Total Protein ELP: 10.2 g/dL — ABNORMAL HIGH (ref 6.0–8.5)

## 2018-11-07 LAB — KAPPA/LAMBDA LIGHT CHAINS
Kappa free light chain: 129 mg/L — ABNORMAL HIGH (ref 3.3–19.4)
Kappa, lambda light chain ratio: 41.61 — ABNORMAL HIGH (ref 0.26–1.65)
LAMDA FREE LIGHT CHAINS: 3.1 mg/L — AB (ref 5.7–26.3)

## 2018-11-07 LAB — PRETREATMENT RBC PHENOTYPE

## 2018-11-08 ENCOUNTER — Inpatient Hospital Stay: Payer: Medicare Other

## 2018-11-08 VITALS — BP 131/72 | HR 84 | Temp 98.5°F | Resp 18

## 2018-11-08 DIAGNOSIS — Z7189 Other specified counseling: Secondary | ICD-10-CM | POA: Diagnosis not present

## 2018-11-08 DIAGNOSIS — G893 Neoplasm related pain (acute) (chronic): Secondary | ICD-10-CM | POA: Diagnosis not present

## 2018-11-08 DIAGNOSIS — C9000 Multiple myeloma not having achieved remission: Secondary | ICD-10-CM

## 2018-11-08 DIAGNOSIS — C9002 Multiple myeloma in relapse: Secondary | ICD-10-CM | POA: Diagnosis not present

## 2018-11-08 MED ORDER — DIPHENHYDRAMINE HCL 25 MG PO CAPS
50.0000 mg | ORAL_CAPSULE | Freq: Once | ORAL | Status: AC
  Administered 2018-11-08: 50 mg via ORAL

## 2018-11-08 MED ORDER — FAMOTIDINE IN NACL 20-0.9 MG/50ML-% IV SOLN
20.0000 mg | Freq: Once | INTRAVENOUS | Status: AC
  Administered 2018-11-08: 20 mg via INTRAVENOUS

## 2018-11-08 MED ORDER — MONTELUKAST SODIUM 10 MG PO TABS
ORAL_TABLET | ORAL | Status: AC
  Filled 2018-11-08: qty 1

## 2018-11-08 MED ORDER — MONTELUKAST SODIUM 10 MG PO TABS
10.0000 mg | ORAL_TABLET | Freq: Every day | ORAL | Status: DC
  Administered 2018-11-08: 10 mg via ORAL

## 2018-11-08 MED ORDER — SODIUM CHLORIDE 0.9 % IV SOLN
Freq: Once | INTRAVENOUS | Status: AC
  Administered 2018-11-08: 08:00:00 via INTRAVENOUS
  Filled 2018-11-08: qty 250

## 2018-11-08 MED ORDER — ACETAMINOPHEN 325 MG PO TABS
ORAL_TABLET | ORAL | Status: AC
  Filled 2018-11-08: qty 2

## 2018-11-08 MED ORDER — METHYLPREDNISOLONE SODIUM SUCC 125 MG IJ SOLR
INTRAMUSCULAR | Status: AC
  Filled 2018-11-08: qty 2

## 2018-11-08 MED ORDER — ACETAMINOPHEN 325 MG PO TABS
650.0000 mg | ORAL_TABLET | Freq: Once | ORAL | Status: AC
  Administered 2018-11-08: 650 mg via ORAL

## 2018-11-08 MED ORDER — SODIUM CHLORIDE 0.9 % IV SOLN
15.7000 mg/kg | Freq: Once | INTRAVENOUS | Status: AC
  Administered 2018-11-08: 1700 mg via INTRAVENOUS
  Filled 2018-11-08: qty 80

## 2018-11-08 MED ORDER — DIPHENHYDRAMINE HCL 25 MG PO CAPS
ORAL_CAPSULE | ORAL | Status: AC
  Filled 2018-11-08: qty 2

## 2018-11-08 MED ORDER — FAMOTIDINE IN NACL 20-0.9 MG/50ML-% IV SOLN
INTRAVENOUS | Status: AC
  Filled 2018-11-08: qty 50

## 2018-11-08 MED ORDER — METHYLPREDNISOLONE SODIUM SUCC 125 MG IJ SOLR
100.0000 mg | Freq: Once | INTRAMUSCULAR | Status: AC
  Administered 2018-11-08: 100 mg via INTRAVENOUS

## 2018-11-08 NOTE — Patient Instructions (Signed)
[facility] Discharge Instructions for Patients Receiving Chemotherapy  Today you received the following chemotherapy agents darzelex  To help prevent nausea and vomiting after your treatment, we encourage you to take your nausea medication as prescribed. If you develop nausea and vomiting that is not controlled by your nausea medication, call the clinic.   BELOW ARE SYMPTOMS THAT SHOULD BE REPORTED IMMEDIATELY:  *FEVER GREATER THAN 100.5 F  *CHILLS WITH OR WITHOUT FEVER  NAUSEA AND VOMITING THAT IS NOT CONTROLLED WITH YOUR NAUSEA MEDICATION  *UNUSUAL SHORTNESS OF BREATH  *UNUSUAL BRUISING OR BLEEDING  TENDERNESS IN MOUTH AND THROAT WITH OR WITHOUT PRESENCE OF ULCERS  *URINARY PROBLEMS  *BOWEL PROBLEMS  UNUSUAL RASH Items with * indicate a potential emergency and should be followed up as soon as possible.  Feel free to call the clinic should you have any questions or concerns. The clinic phone number is [phone].  Please show the [facility] at check-in to the Emergency Department and triage nurse.   Daratumumab injection What is this medicine? DARATUMUMAB (dar a toom ue mab) is a monoclonal antibody. It is used to treat multiple myeloma. This medicine may be used for other purposes; ask your health care provider or pharmacist if you have questions. COMMON BRAND NAME(S): DARZALEX What should I tell my health care provider before I take this medicine? They need to know if you have any of these conditions: -infection (especially a virus infection such as chickenpox, herpes, or hepatitis B virus) -lung or breathing disease -an unusual or allergic reaction to daratumumab, other medicines, foods, dyes, or preservatives -pregnant or trying to get pregnant -breast-feeding How should I use this medicine? This medicine is for infusion into a vein. It is given by a health care professional in a hospital or clinic setting. Talk to your pediatrician  regarding the use of this medicine in children. Special care may be needed. Overdosage: If you think you have taken too much of this medicine contact a poison control center or emergency room at once. NOTE: This medicine is only for you. Do not share this medicine with others. What if I miss a dose? Keep appointments for follow-up doses as directed. It is important not to miss your dose. Call your doctor or health care professional if you are unable to keep an appointment. What may interact with this medicine? Interactions have not been studied. Give your health care provider a list of all the medicines, herbs, non-prescription drugs, or dietary supplements you use. Also tell them if you smoke, drink alcohol, or use illegal drugs. Some items may interact with your medicine. This list may not describe all possible interactions. Give your health care provider a list of all the medicines, herbs, non-prescription drugs, or dietary supplements you use. Also tell them if you smoke, drink alcohol, or use illegal drugs. Some items may interact with your medicine. What should I watch for while using this medicine? This drug may make you feel generally unwell. Report any side effects. Continue your course of treatment even though you feel ill unless your doctor tells you to stop. This medicine can cause serious allergic reactions. To reduce your risk you may need to take medicine before treatment with this medicine. Take your medicine as directed. This medicine can affect the results of blood tests to match your blood type. These changes can last for up to 6 months after the final dose. Your healthcare provider will do blood tests to match your blood type  before you start treatment. Tell all of your healthcare providers that you are being treated with this medicine before receiving a blood transfusion. This medicine can affect the results of some tests used to determine treatment response; extra tests may be  needed to evaluate response. Do not become pregnant while taking this medicine or for 3 months after stopping it. Women should inform their doctor if they wish to become pregnant or think they might be pregnant. There is a potential for serious side effects to an unborn child. Talk to your health care professional or pharmacist for more information. What side effects may I notice from receiving this medicine? Side effects that you should report to your doctor or health care professional as soon as possible: -allergic reactions like skin rash, itching or hives, swelling of the face, lips, or tongue -breathing problems -chills -cough -dizziness -feeling faint or lightheaded -headache -low blood counts - this medicine may decrease the number of white blood cells, red blood cells and platelets. You may be at increased risk for infections and bleeding. -nausea, vomiting -shortness of breath -signs of decreased platelets or bleeding - bruising, pinpoint red spots on the skin, black, tarry stools, blood in the urine -signs of decreased red blood cells - unusually weak or tired, feeling faint or lightheaded, falls -signs of infection - fever or chills, cough, sore throat, pain or difficulty passing urine -signs and symptoms of liver injury like dark yellow or brown urine; general ill feeling or flu-like symptoms; light-colored stools; loss of appetite; right upper belly pain; unusually weak or tired; yellowing of the eyes or skin Side effects that usually do not require medical attention (report to your doctor or health care professional if they continue or are bothersome): -back pain -constipation -loss of appetite -diarrhea -joint pain -muscle cramps -pain, tingling, numbness in the hands or feet -swelling of the ankles, feet, hands -tiredness -trouble sleeping This list may not describe all possible side effects. Call your doctor for medical advice about side effects. You may report side  effects to FDA at 1-800-FDA-1088. Where should I keep my medicine? Keep out of the reach of children. This drug is given in a hospital or clinic and will not be stored at home. NOTE: This sheet is a summary. It may not cover all possible information. If you have questions about this medicine, talk to your doctor, pharmacist, or health care provider.  2019 [employer] (2018-05-04 15:52:44)

## 2018-11-12 ENCOUNTER — Telehealth: Payer: Self-pay | Admitting: *Deleted

## 2018-11-12 NOTE — Telephone Encounter (Signed)
[place] for chemotherapy F/U.  Wife says "his ribs are giving him a fit.  He's not really doing anything, not resting well, family here to help."   Geralynn Ochs says "treatmemt has taken a hold on me.  It's from the cancer but I'm okay.  I hurt trying to get up and down with pain.  Soreness to ribs, sides and back.  Using what the doctor ordered.  One percocet to take edge off in the mornings.  I wait until bedtime to take another.  I'm focusing on drinking water to stay hydrated for appointments this week." Denies n/v, new side effects, symptoms or changes.  Bowel and bladder functioning well.  "Used Gwendolyn Lima today; feeling tight in my stomach."  Eating and drinking well.  Instructed to drink 64 oz minimum daily or at least the day before, of and after treatment.  Reviewed pain scale, Percocet order and prn use to better contraol pain medications as needed.  Denies questions or needs at this time.  Encouraged to call if needed.  Dial [phone] Mon -Fri 8:00 am - 4:30 pm or anytime as needed for symptoms, changes or event outside office hours.

## 2018-11-13 ENCOUNTER — Encounter: Payer: Self-pay | Admitting: *Deleted

## 2018-11-13 ENCOUNTER — Other Ambulatory Visit: Payer: Self-pay | Admitting: Interventional Radiology

## 2018-11-13 ENCOUNTER — Other Ambulatory Visit: Payer: Medicare Other

## 2018-11-13 ENCOUNTER — Ambulatory Visit
Admission: RE | Admit: 2018-11-13 | Discharge: 2018-11-13 | Disposition: A | Discharge status: Home / Self Care | Payer: Medicare Other | Source: Ambulatory Visit | Attending: Hematology | Admitting: Hematology

## 2018-11-13 DIAGNOSIS — C9000 Multiple myeloma not having achieved remission: Secondary | ICD-10-CM | POA: Diagnosis not present

## 2018-11-13 DIAGNOSIS — C9002 Multiple myeloma in relapse: Secondary | ICD-10-CM

## 2018-11-13 DIAGNOSIS — S22080A Wedge compression fracture of T11-T12 vertebra, initial encounter for closed fracture: Secondary | ICD-10-CM

## 2018-11-13 DIAGNOSIS — G893 Neoplasm related pain (acute) (chronic): Secondary | ICD-10-CM | POA: Diagnosis not present

## 2018-11-13 HISTORY — PX: IR RADIOLOGIST EVAL & MGMT: IMG5224

## 2018-11-13 NOTE — Progress Notes (Signed)
[name]    HEMATOLOGY/ONCOLOGY CLINIC NOTE  Date of Service: 11/14/18     Patient Care Team: [name], MD as PCP - General (Family Medicine) [name], MD as Consulting Physician (Hematology) [name], MD as Attending Physician (Radiation Oncology) [name], RN as Oncology Nurse Navigator (Oncology) [name] MD ([facility] _ primary oncology) phone number [phone] Email- [email or web address]  CHIEF COMPLAINTS  followup of multiple myeloma.  HISTORY OF PRESENTING ILLNESS:   Michael [name] is a wonderful 68 y.o. 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 Patient is currently on a study protocol and is being monitored for myeloma recurrence.  Based on available records being put together  -Patient was diagnosed with IgG kappa ISS stage I multiple myeloma with hyperdiploid complex cytogenetics in 2012. Bone marrow biopsy apparently showed 50% kappa restricted plasma cells with an initial M spike of 3.4 g/dL with evidence of lytic lesions on his PET/CT scan including right rib fracture and lesions of the lumbar and cervical spine. -Patient was treated under protocol 11-C-0221 with from 02/21/2013 with  Carfilzomib/Revlimid/Dexamethasone for a total of 8 cycles. He reports that his stem cells were collected and stored after 4 cycles -Posttreatment bone marrow biopsy showed less than 5% plasma cells with a negative flow cytometry and improvement in his previously PET avid lesions. Some concern for new focus of activity at C2 lytic lesion at L4 and several small lesions throughout the vertebrae. -Patient was on maintenance lenalidomide 10 mg by  mouth daily for 2 years or more until end of 2014. 08/19/2013 - bone marrow biopsy showed normocellular marrow with less than 5% plasma cells. PET CT scan showed decreased focal metabolic ligament prior rib lesions and no new lesions. Flow cytometry was negative for residual disease. Serum and urine electrophoresis and IFE showed no evidence of monoclonal protein. -Patient notes that he was off protocol for a few years due to lack of clinical trial research funding. -Patient notes that he is back on a follow-up protocol and continues to follow and receive his primary treatment at [facility] at this time. -Patient reports he had his last PET CT scan blood and urine tests and bone marrow examination in February 2018. The results of which are not available to [place] currently.  He notes that there was concern for a very slowly growing sternal FDG avid lesion that has been present for years. He has a follow-up at [place] next month to determine the management of this lesion. He reports that was some consideration of considering radiation. he notes that this lesion has not been biopsied. No other focal bone pains at this time.  We discussed in detail about what our role will be and noted that we shall be available for any acute issues that need to be taken care of locally but that at this time the primary treatment and evaluation will be driven by his team at [place] as per their research protocols and input. The patient and his wife are clearly aware of this and are in agreement with this plan.  INTERVAL HISTORY  [name] comes is here for continued management of his Multiple Myeloma and recently diagnosed Plasmacytoma. The patient's last visit with [place]  was on 11/06/18. He is accompanied today by his niece. The pt reports that he is doing well overall.   The pt reports that his upper body feels sore, and his ribs are causing him significant pain. He has been taking three Oxycodone a day, two of which he  takes at night before bed, and one during the daytime. He notes that his goal is to "just cut the pain a little," as he is admittedly nervous of developing an addiction. The pt denies ever having an addiction before. The patient's niece notes that she has been in town for the last 3 days, and feels she has observed the patient to be in significant pain.   The pt notes that he has had a difficult time standing up from chairs, hard or soft, noting his back pain limits him. He does endorse concern for not functioning well at home, which his niece endorses.   The pt will be pursuing a NM Bone w/spect on 11/22/18, for further evaluation of T12. He also endorses constipation.   The pt notes that he has tolerated Daratumumab well so far, has been eating well, and denies concerns for infections.  Lab results today (11/14/18) of CBC w/diff and CMP is as follows: all values are WNL except for WBC at 2.3k, RBC at 3.12, HGB at 10.0, HCT at 29.7, ANC at 1.6k, Lymphs abs at 300, Sodium at 134, Glucose at 110, Total Protein at 9.7, Albumin at 3.1.  On review of systems, pt reports back pain, rib pain, constipation, eating well, and denies concerns for infections, fevers, chills, and any other symptoms.   MEDICAL HISTORY:  Past Medical History:  Diagnosis Date  . Angioedema   . CPAP (continuous positive airway pressure) dependence    uses at night  . Gastroesophageal reflux disease without esophagitis   . History of radiation therapy 06/04/18- 06/15/18   25 Gy in 10 fractions to the laryngeal/ cartilage lesion  . Hyperlipidemia   . Multiple myeloma ([place]) 2012   Treated at [facility]  . Non morbid obesity due to excess calories   . Prediabetes   . Prediabetes   . Unspecified glaucoma(365.9)   . Urticaria, unspecified   Diabetes mellitus Glaucoma Left toes neuropathy  GERD  SURGICAL HISTORY: Past Surgical History:  Procedure Laterality Date  . IR RADIOLOGIST EVAL & MGMT  11/13/2018  . PORTACATH PLACEMENT      . PORTACATH PLACEMENT     removed 02/2016    SOCIAL HISTORY: Social History   Socioeconomic History  . Marital status: Married    Spouse name: Not on file  . Number of children: 2  . Years of education: Not on file  . Highest education level: Not on file  Occupational History  . Occupation: Retired Chief Strategy Officer for [employer]  . Financial resource strain: Not on file  . Food insecurity:    Worry: Not on file    Inability: Not on file  . Transportation needs:    Medical: Not on file    Non-medical: Not on file  Tobacco Use  . Smoking status: Never Smoker  . Smokeless tobacco: Never Used  Substance and Sexual Activity  . Alcohol use: Yes    Alcohol/week: 0.0 standard drinks    Comment: Occasional beer  . Drug use: No  . Sexual activity: Not on file  Lifestyle  . Physical activity:    Days per week: Not on file    Minutes per session: Not on  file  . Stress: Not on file  Relationships  . Social connections:    Talks on phone: Not on file    Gets together: Not on file    Attends religious service: Not on file    Active member of club or organization: Not on file    Attends meetings of clubs or organizations: Not on file    Relationship status: Not on file  . Intimate partner violence:    Fear of current or ex partner: No    Emotionally abused: No    Physically abused: No    Forced sexual activity: No  Other Topics Concern  . Not on file  Social History Narrative   Unable to [employer] partner violence- partner in room   Never smoker Married Retired from the [employer] in June 2017 and moved to [facility].  FAMILY HISTORY: Family History  Problem Relation Age of Onset  . Cancer Father     ALLERGIES:  has No Known Allergies.  MEDICATIONS:  Current Outpatient Medications  Medication Sig Dispense Refill  . acyclovir (ZOVIRAX) 400 MG tablet Take 1 tablet (400 mg total) by mouth 2 (two) times daily. 60 tablet 11  . aspirin  81 MG tablet Take 81 mg by mouth daily.    [name] atorvastatin (LIPITOR) 20 MG tablet Take 20 mg by mouth every evening.     . cyclobenzaprine (FLEXERIL) 5 MG tablet Take 1 tablet (5 mg total) by mouth 3 (three) times daily as needed for muscle spasms. 30 tablet 0  . dexamethasone (DECADRON) 4 MG tablet 59m (5 tabs) with breakfast the day after each daratumumab treatment 20 tablet 4  . diazepam (VALIUM) 5 MG tablet Take 1 tablet (5 mg total) by mouth every 6 (six) hours as needed for muscle spasms. (Patient not taking: Reported on 11/06/2018) 10 tablet 0  . lenalidomide (REVLIMID) 15 MG capsule Take 1 capsule (15 mg total) by mouth daily. Take days 1-21 every 28 days. AJosem Kaufmann##[id number]/29/19 (Patient not taking: Reported on 10/26/2018) 21 capsule 0  . Lido-Capsaicin-Men-Methyl Sal (MEDI-PATCH-LIDOCAINE) 0.5-0.035-5-20 % PTCH Apply 1 patch topically 2 (two) times daily as needed. 30 patch 0  . Multiple Vitamin (MULTIVITAMIN) tablet Take 1 tablet by mouth daily.    . naproxen sodium (ALEVE) 220 MG tablet Take 440 mg by mouth every 12 (twelve) hours.    .[name] Kitchennystatin (MYCOSTATIN/NYSTOP) powder Apply 100,000 g topically daily as needed.    . ondansetron (ZOFRAN) 8 MG tablet Take 1 tablet (8 mg total) by mouth 2 (two) times daily as needed (Nausea or vomiting). 30 tablet 1  . oxyCODONE-acetaminophen (PERCOCET) 5-325 MG tablet Take 1-2 tablets by mouth every 4 (four) hours as needed. 60 tablet 0  . pantoprazole (PROTONIX) 40 MG tablet Take 40 mg by mouth daily.     . polyethylene glycol (MIRALAX) packet Take 17 g by mouth daily.    . prochlorperazine (COMPAZINE) 10 MG tablet Take 1 tablet (10 mg total) by mouth every 6 (six) hours as needed (Nausea or vomiting). 30 tablet 1  . SENNOSIDES-DOCUSATE SODIUM PO Take 1 tablet by mouth daily. Takes 50 mg    . sodium chloride 0.9 % SOLN 250 mL with avelumab 200 MG/10ML SOLN 10 mg/kg Inject 800 mg/kg into the vein every 14 (fourteen) days.     No current  facility-administered medications for this visit.     REVIEW OF SYSTEMS:    A 10+ POINT REVIEW OF SYSTEMS WAS OBTAINED including neurology,  dermatology, psychiatry, cardiac, respiratory, lymph, extremities, GI, GU, Musculoskeletal, constitutional, breasts, reproductive, HEENT.  All pertinent positives are noted in the HPI.  All others are negative.   PHYSICAL EXAMINATION: ECOG PERFORMANCE STATUS: 1 - Symptomatic but completely ambulatory  Vitals:   11/14/18 0911  BP: 116/63  Pulse: 82  Resp: 18  Temp: 98.1 F (36.7 C)  TempSrc: Oral  SpO2: 98%  Weight: 240 lb (108.9 kg)  Height: 6' 1"  (1.854 m)   Body mass index is 31.66 kg/m.   GENERAL:alert, in no acute distress and comfortable SKIN: no acute rashes, no significant lesions EYES: conjunctiva are pink and non-injected, sclera anicteric OROPHARYNX: MMM, no exudates, no oropharyngeal erythema or ulceration NECK: supple, no JVD LYMPH:  no palpable lymphadenopathy in the cervical, axillary or inguinal regions LUNGS: clear to auscultation b/l with normal respiratory effort HEART: regular rate & rhythm ABDOMEN:  normoactive bowel sounds , non tender, not distended. No palpable hepatosplenomegaly.  Extremity: no pedal edema PSYCH: alert & oriented x 3 with fluent speech NEURO: no focal motor/sensory deficits   LABORATORY DATA:  I have reviewed the data as listed  . CBC Latest Ref Rng & Units 11/14/2018 11/06/2018 10/26/2018  WBC 4.0 - 10.5 K/uL 2.3(L) 3.7(L) 3.2(L)  Hemoglobin 13.0 - 17.0 g/dL 10.0(L) 10.9(L) 11.4(L)  Hematocrit 39.0 - 52.0 % 29.7(L) 32.2(L) 33.8(L)  Platelets 150 - 400 K/uL 158 186 194  ANC 1200  . CMP Latest Ref Rng & Units 11/14/2018 11/06/2018 10/26/2018  Glucose 70 - 99 mg/dL 110(H) 104(H) 104(H)  BUN 8 - 23 mg/dL 11 13 11   Creatinine 0.61 - 1.24 mg/dL 0.94 1.07 1.03  Sodium 135 - 145 mmol/L 134(L) 133(L) 135  Potassium 3.5 - 5.1 mmol/L 4.2 4.3 4.1  Chloride 98 - 111 mmol/L 103 104 103  CO2 22 - 32  mmol/L 26 23 27   Calcium 8.9 - 10.3 mg/dL 10.1 11.3(H) 12.7(H)  Total Protein 6.5 - 8.1 g/dL 9.7(H) 10.9(H) 10.5(H)  Total Bilirubin 0.3 - 1.2 mg/dL 0.8 0.9 0.8  Alkaline Phos 38 - 126 U/L 93 108 126  AST 15 - 41 U/L 17 22 23   ALT 0 - 44 U/L 12 15 25     05/08/18 Left Neck Thyroid Cartilage Biopsy:        RADIOGRAPHIC STUDIES: I have personally reviewed the radiological images as listed and agreed with the findings in the report. Dg Lumbar Spine Complete  Result Date: 10/15/2018 CLINICAL DATA:  Low back pain for 4 days since hanging Christmas decorations. The patient has a history of multiple myeloma. EXAM: LUMBAR SPINE - COMPLETE 4+ VIEW COMPARISON:  None. FINDINGS: Vertebral body height and alignment are maintained. Marked loss of disc space height and endplate spurring are seen at L3-4, L4-5 and L5-S1. Facet degenerative disease is also present at these levels. Known myeloma is deposits are not visible on plain films. Paraspinous structures demonstrate atherosclerosis. IMPRESSION: No acute abnormality. Advanced appearing degenerative disease L3-4, L4-5 and L5-S1. Known myeloma is deposits in the spine are not visible on plain films. Electronically Signed   By: Inge Rise M.D.   On: 10/15/2018 15:30   Dg Pelvis 1-2 Views  Result Date: 10/15/2018 CLINICAL DATA:  Low back pain for 4 days since hanging Christmas decorations. History of multiple myeloma. EXAM: PELVIS - 1-2 VIEW COMPARISON:  None. FINDINGS: There is no evidence of pelvic fracture or diastasis. No pelvic bone lesions are seen. No lytic or sclerotic lesions are identified. There is some degenerative change about  the SI joints. IMPRESSION: No acute or focal abnormality. No lytic lesions are identified on this exam. Bilateral SI joint osteoarthritis. Electronically Signed   By: Inge Rise M.D.   On: 10/15/2018 15:33   Ir Radiologist Eval & Mgmt  Result Date: 11/13/2018 Please refer to notes tab for details about  interventional procedure. (Op Note)  Mr Outside Films Spine  Result Date: 11/02/2018 This examination belongs to an outside facility and is stored here for comparison purposes only.  Contact the originating outside institution for any associated report or interpretation.   ASSESSMENT & PLAN:   68 y.o. 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 Bone marrow biopsy apparently showed 50% kappa restricted plasma cells with an initial M spike of 3.4 g/dL with evidence of lytic lesions on his PET/CT scan including right rib fracture and lesions of the lumbar and cervical spine. -Patient was treated under protocol 11-C-0221 with from 02/21/2013 with  Carfilzomib/Revlimid/Dexamethasone for a total of 8 cycles. He reports that his stem cells were collected and stored after 4 cycles -Posttreatment bone marrow biopsy showed less than 5% plasma cells with a negative flow cytometry and improvement in his previously PET avid lesions. Some concern for new focus of activity at C2 lytic lesion at L4 and several small lesions throughout the vertebrae. -Patient was on maintenance lenalidomide 10 mg by mouth daily for 2 years or more until end of 2014. 08/19/2013 - bone marrow biopsy showed normocellular marrow with less than 5% plasma cells. PET CT scan showed decreased focal metabolic ligament prior rib lesions and no new lesions. Flow cytometry was negative for residual disease. Serum and urine electrophoresis and IFE showed no evidence of monoclonal protein. -Patient notes that he was off protocol for a few years due to lack of clinical trial research funding. -Patient returned to follow-up protocol and began to follow and receive his primary treatment at [facility]  -Patient's labs show minimal IgG kappa protein on IFE and a CT chest in May 2018 that did not show any overt signs of  myeloma progression in the bones.  Rpt Myeloma labs 03/2017 - M spike of 0.3g/dl with a repeat M spike stable at 0.3 g/dL on 05/23/2017 which remains stable on labs today 07/25/17 Serum kappa lambda free light chain ratio not significantly changed. PET/CT scan showed a single metabolically active sternal lesion without any overt bony destruction. Bone marrow biopsy done on 06/06/2017 - shows no overt involvement with multiple myeloma.  04/24/18 CT Soft Tissue Neck revealed Left laryngeal 3 cm soft tissue mass appears to have originated within and is expanding the left thyroid cartilage. This is new since the August 2018 PET-CT, and there is absent lymphadenopathy. 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 Primary cartilaginous neoplasm, squamous cell carcinoma, and other differential considerations are less likely. Superimposed widespread multiple myeloma lesions in the visible skeleton.  05/08/18 Left neck thyroid cartilage biopsy revealed Plasma cell neoplasm.  05/22/18 PET/CT revealed Soft tissue mass centered around the destroyed left thyroid cartilage is hypermetabolic and consistent with known plasmacytoma. No adenopathy in the neck. 2. Hypermetabolic 3 cm cutaneous and subcutaneous soft tissue mass involving the posterior upper thorax suspicious for cutaneous manifestation of myeloma (plasmacytoma). 3. Persistent and slightly progressive hypermetabolic sternal lesion. 4. Diffuse lytic myelomatous lesions throughout the bony structures but no other hypermetabolic foci. 5. Focus of  hypermetabolism in the left aspect of the lower prostate gland, not present on the prior study. This could be an area of infection/inflammation or potential prostate cancer. Recommend correlation with physical examination and PSA level.    Completed RT between 06/04/18 and 06/15/18 of 25 Gy by 10 fractions  The pt pursued a clinical trial at the [facility] with RT and immunotherapy in October 2019  through October 18, 2018 with further information available at [email or web address]  10/18/18 M Protein increased to 3.5g, from 0.7g at our last visit on 07/10/18. 10/02/18 PET/CT from [facility] indicated new hypermetabolism in left clavicle, left scapula, bilateral ribs, spine, and left proximal femur 10/18/18 MRI from [facility] ndicated a pathologic fracture at T12   2) h/o Elevated bilirubin level .   3) PET/CT abnormality in the Prostate. PSA levels WNL -Primary care physician to consider urology referral  4) Abnormal focus of FDG avid at a spot in the liver - will need to be monitor on f/u scans. No pain or other symptoms at this time.  PLAN:   -Discussed pt labwork today, 11/14/18; HGB at 10.0, Calcium normalized to 10.1, Total Protein improved to 9.7 -Last available M Protein from 11/06/18 was increased from 3.4 to 4.1 in one week -The pt has no prohibitive toxicities from continuing C1D8 Daratumumab and Dexamethasone at this time.   -Discussed the indications and goals for pain control in the setting of cancer, with the pt and his niece. The pt has continued to endorse pain, but reports taking only 1 Oxycodone during the daytime, and 2 at night. Recommended that he could take his Oxycodone as prescribed, up to 1-2 tablets every 4-6 hours as needed. The pt notes he understands this and will begin taking his pain medication more therapeutically. -Will begin patient on lowest dose 81mg/h Fentanyl patch as well -Discussed baseline laxative use of 2 pills Senna S at night, and one Miralax. Back off if diarrhea develops.  -100-150cc Magnesium citrate if no bowel movement is achieved after 2-3 days -Continue follow up with IR for consideration of vertebroplasty  -Recommended that the pt not bend, pull, push or lift objects to protect his T12 fracture from further damage. -Will refer the pt to home health for evaluation  -Will look to add on Revlimid or Carfilzomib after one month of  displayed tolerance to Daratumumab and Dexamethasone. Leaning towards Carfilzomib.  -Flexeril prn for muscle relaxation -Continue Zometa every 4 weeks  -Consume at least 60oz of water each day -Continue Acyclovir -Discussed Port placement- pt would like to wait on this -Continue salt and baking soda mouthwashes -Will see the pt back in 2 weeks   Labs weekly  Please continue Weekly Daratumumab as ordered - plz schedule next 4 doses Continue Zometa q4 weeks RTC with Dr KIrene Limboin 2 weeks   All of the patients questions were answered with apparent satisfaction. The patient knows to call the clinic with any problems, questions or concerns.  The total time spent in the appt was 35 minutes and more than 50% was on counseling and direct patient cares.    GSullivan LoneMD MEstillAAHIVMS SDix[facility] River [facility] CampusCBarlow Respiratory HospitalHematology/Oncology Physician [facility] (Office):       [phone](Work cell):  [phone](Fax):           [phone] I, SBaldwin Jamaica am acting as a scribe for Dr. GSullivan Lone   .I have reviewed the above documentation for accuracy and completeness, and I agree with the above. .[name]  [name] MD
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
## 2018-11-14 ENCOUNTER — Ambulatory Visit: Payer: Medicare Other

## 2018-11-14 ENCOUNTER — Inpatient Hospital Stay: Payer: Medicare Other

## 2018-11-14 ENCOUNTER — Other Ambulatory Visit: Payer: Medicare Other

## 2018-11-14 ENCOUNTER — Inpatient Hospital Stay (HOSPITAL_BASED_OUTPATIENT_CLINIC_OR_DEPARTMENT_OTHER): Payer: Medicare Other | Admitting: Hematology

## 2018-11-14 VITALS — BP 116/63 | HR 82 | Temp 98.1°F | Resp 18 | Ht 73.0 in | Wt 240.0 lb

## 2018-11-14 VITALS — BP 116/62 | HR 82 | Temp 99.1°F | Resp 18

## 2018-11-14 DIAGNOSIS — Z7982 Long term (current) use of aspirin: Secondary | ICD-10-CM

## 2018-11-14 DIAGNOSIS — Z923 Personal history of irradiation: Secondary | ICD-10-CM

## 2018-11-14 DIAGNOSIS — C9000 Multiple myeloma not having achieved remission: Secondary | ICD-10-CM

## 2018-11-14 DIAGNOSIS — Z7189 Other specified counseling: Secondary | ICD-10-CM

## 2018-11-14 DIAGNOSIS — G893 Neoplasm related pain (acute) (chronic): Secondary | ICD-10-CM | POA: Diagnosis not present

## 2018-11-14 DIAGNOSIS — E119 Type 2 diabetes mellitus without complications: Secondary | ICD-10-CM

## 2018-11-14 DIAGNOSIS — R627 Adult failure to thrive: Secondary | ICD-10-CM

## 2018-11-14 DIAGNOSIS — Z5112 Encounter for antineoplastic immunotherapy: Secondary | ICD-10-CM

## 2018-11-14 DIAGNOSIS — C9002 Multiple myeloma in relapse: Secondary | ICD-10-CM | POA: Diagnosis not present

## 2018-11-14 DIAGNOSIS — K5900 Constipation, unspecified: Secondary | ICD-10-CM

## 2018-11-14 DIAGNOSIS — Z79899 Other long term (current) drug therapy: Secondary | ICD-10-CM

## 2018-11-14 DIAGNOSIS — S22080A Wedge compression fracture of T11-T12 vertebra, initial encounter for closed fracture: Secondary | ICD-10-CM

## 2018-11-14 LAB — CMP (CANCER CENTER ONLY)
ALK PHOS: 93 U/L (ref 38–126)
ALT: 12 U/L (ref 0–44)
AST: 17 U/L (ref 15–41)
Albumin: 3.1 g/dL — ABNORMAL LOW (ref 3.5–5.0)
Anion gap: 5 (ref 5–15)
BUN: 11 mg/dL (ref 8–23)
CALCIUM: 10.1 mg/dL (ref 8.9–10.3)
CHLORIDE: 103 mmol/L (ref 98–111)
CO2: 26 mmol/L (ref 22–32)
Creatinine: 0.94 mg/dL (ref 0.61–1.24)
GFR, Est AFR Am: >60 mL/min (ref >60)
GFR, Estimated: >60 mL/min (ref >60)
Glucose, Bld: 110 mg/dL — ABNORMAL HIGH (ref 70–99)
Potassium: 4.2 mmol/L (ref 3.5–5.1)
Sodium: 134 mmol/L — ABNORMAL LOW (ref 135–145)
Total Bilirubin: 0.8 mg/dL (ref 0.3–1.2)
Total Protein: 9.7 g/dL — ABNORMAL HIGH (ref 6.5–8.1)

## 2018-11-14 LAB — CBC WITH DIFFERENTIAL/PLATELET
Abs Immature Granulocytes: 0.04 10*3/uL (ref 0.00–0.07)
Basophils Absolute: 0 10*3/uL (ref 0.0–0.1)
Basophils Relative: 0 %
Eosinophils Absolute: 0.1 10*3/uL (ref 0.0–0.5)
Eosinophils Relative: 3 %
HCT: 29.7 % — ABNORMAL LOW (ref 39.0–52.0)
Hemoglobin: 10 g/dL — ABNORMAL LOW (ref 13.0–17.0)
Immature Granulocytes: 2 %
LYMPHS ABS: 0.3 10*3/uL — AB (ref 0.7–4.0)
Lymphocytes Relative: 14 %
MCH: 32.1 pg (ref 26.0–34.0)
MCHC: 33.7 g/dL (ref 30.0–36.0)
MCV: 95.2 fL (ref 80.0–100.0)
Monocytes Absolute: 0.3 10*3/uL (ref 0.1–1.0)
Monocytes Relative: 13 %
Neutro Abs: 1.6 10*3/uL — ABNORMAL LOW (ref 1.7–7.7)
Neutrophils Relative %: 68 %
Platelets: 158 10*3/uL (ref 150–400)
RBC: 3.12 MIL/uL — ABNORMAL LOW (ref 4.22–5.81)
RDW: 15.2 % (ref 11.5–15.5)
WBC: 2.3 10*3/uL — ABNORMAL LOW (ref 4.0–10.5)
nRBC: 0 % (ref 0.0–0.2)

## 2018-11-14 MED ORDER — MONTELUKAST SODIUM 10 MG PO TABS
ORAL_TABLET | ORAL | Status: AC
  Filled 2018-11-14: qty 1

## 2018-11-14 MED ORDER — SODIUM CHLORIDE 0.9 % IV SOLN
Freq: Once | INTRAVENOUS | Status: AC
  Administered 2018-11-14: 10:00:00 via INTRAVENOUS
  Filled 2018-11-14: qty 250

## 2018-11-14 MED ORDER — METHYLPREDNISOLONE SODIUM SUCC 125 MG IJ SOLR
INTRAMUSCULAR | Status: AC
  Filled 2018-11-14: qty 2

## 2018-11-14 MED ORDER — FENTANYL 12 MCG/HR TD PT72: 1.0000 | MEDICATED_PATCH | TRANSDERMAL | 0 refills | Status: DC

## 2018-11-14 MED ORDER — DIPHENHYDRAMINE HCL 25 MG PO CAPS
ORAL_CAPSULE | ORAL | Status: AC
  Filled 2018-11-14: qty 2

## 2018-11-14 MED ORDER — ACETAMINOPHEN 325 MG PO TABS
ORAL_TABLET | ORAL | Status: AC
  Filled 2018-11-14: qty 2

## 2018-11-14 MED ORDER — MONTELUKAST SODIUM 10 MG PO TABS
10.0000 mg | ORAL_TABLET | Freq: Every day | ORAL | Status: DC
  Administered 2018-11-14: 10 mg via ORAL

## 2018-11-14 MED ORDER — SENNOSIDES-DOCUSATE SODIUM 8.6-50 MG PO TABS: 2.0000 | ORAL_TABLET | Freq: Every day | ORAL | 0 refills | Status: DC

## 2018-11-14 MED ORDER — DIPHENHYDRAMINE HCL 25 MG PO CAPS
50.0000 mg | ORAL_CAPSULE | Freq: Once | ORAL | Status: AC
  Administered 2018-11-14: 50 mg via ORAL

## 2018-11-14 MED ORDER — FAMOTIDINE IN NACL 20-0.9 MG/50ML-% IV SOLN
INTRAVENOUS | Status: AC
  Filled 2018-11-14: qty 50

## 2018-11-14 MED ORDER — ACETAMINOPHEN 325 MG PO TABS
650.0000 mg | ORAL_TABLET | Freq: Once | ORAL | Status: AC
  Administered 2018-11-14: 650 mg via ORAL

## 2018-11-14 MED ORDER — FAMOTIDINE IN NACL 20-0.9 MG/50ML-% IV SOLN
20.0000 mg | Freq: Once | INTRAVENOUS | Status: AC
  Administered 2018-11-14: 20 mg via INTRAVENOUS

## 2018-11-14 MED ORDER — METHYLPREDNISOLONE SODIUM SUCC 125 MG IJ SOLR
100.0000 mg | Freq: Once | INTRAMUSCULAR | Status: AC
  Administered 2018-11-14: 100 mg via INTRAVENOUS

## 2018-11-14 MED ORDER — SODIUM CHLORIDE 0.9 % IV SOLN
15.7000 mg/kg | Freq: Once | INTRAVENOUS | Status: AC
  Administered 2018-11-14: 1700 mg via INTRAVENOUS
  Filled 2018-11-14: qty 80

## 2018-11-14 MED ORDER — TOTAL COMFORT CHAIR CUSHION MISC: 1.0000 | Freq: Once | 0 refills | Status: AC

## 2018-11-14 NOTE — Patient Instructions (Signed)
[facility] Discharge Instructions for Patients Receiving Chemotherapy  Today you received the following chemotherapy agents darzelex  To help prevent nausea and vomiting after your treatment, we encourage you to take your nausea medication as prescribed. If you develop nausea and vomiting that is not controlled by your nausea medication, call the clinic.   BELOW ARE SYMPTOMS THAT SHOULD BE REPORTED IMMEDIATELY:  *FEVER GREATER THAN 100.5 F  *CHILLS WITH OR WITHOUT FEVER  NAUSEA AND VOMITING THAT IS NOT CONTROLLED WITH YOUR NAUSEA MEDICATION  *UNUSUAL SHORTNESS OF BREATH  *UNUSUAL BRUISING OR BLEEDING  TENDERNESS IN MOUTH AND THROAT WITH OR WITHOUT PRESENCE OF ULCERS  *URINARY PROBLEMS  *BOWEL PROBLEMS  UNUSUAL RASH Items with * indicate a potential emergency and should be followed up as soon as possible.  Feel free to call the clinic should you have any questions or concerns. The clinic phone number is [phone].  Please show the [facility] at check-in to the Emergency Department and triage nurse.   Daratumumab injection What is this medicine? DARATUMUMAB (dar a toom ue mab) is a monoclonal antibody. It is used to treat multiple myeloma. This medicine may be used for other purposes; ask your health care provider or pharmacist if you have questions. COMMON BRAND NAME(S): DARZALEX What should I tell my health care provider before I take this medicine? They need to know if you have any of these conditions: -infection (especially a virus infection such as chickenpox, herpes, or hepatitis B virus) -lung or breathing disease -an unusual or allergic reaction to daratumumab, other medicines, foods, dyes, or preservatives -pregnant or trying to get pregnant -breast-feeding How should I use this medicine? This medicine is for infusion into a vein. It is given by a health care professional in a hospital or clinic setting. Talk to your pediatrician  regarding the use of this medicine in children. Special care may be needed. Overdosage: If you think you have taken too much of this medicine contact a poison control center or emergency room at once. NOTE: This medicine is only for you. Do not share this medicine with others. What if I miss a dose? Keep appointments for follow-up doses as directed. It is important not to miss your dose. Call your doctor or health care professional if you are unable to keep an appointment. What may interact with this medicine? Interactions have not been studied. Give your health care provider a list of all the medicines, herbs, non-prescription drugs, or dietary supplements you use. Also tell them if you smoke, drink alcohol, or use illegal drugs. Some items may interact with your medicine. This list may not describe all possible interactions. Give your health care provider a list of all the medicines, herbs, non-prescription drugs, or dietary supplements you use. Also tell them if you smoke, drink alcohol, or use illegal drugs. Some items may interact with your medicine. What should I watch for while using this medicine? This drug may make you feel generally unwell. Report any side effects. Continue your course of treatment even though you feel ill unless your doctor tells you to stop. This medicine can cause serious allergic reactions. To reduce your risk you may need to take medicine before treatment with this medicine. Take your medicine as directed. This medicine can affect the results of blood tests to match your blood type. These changes can last for up to 6 months after the final dose. Your healthcare provider will do blood tests to match your blood type  before you start treatment. Tell all of your healthcare providers that you are being treated with this medicine before receiving a blood transfusion. This medicine can affect the results of some tests used to determine treatment response; extra tests may be  needed to evaluate response. Do not become pregnant while taking this medicine or for 3 months after stopping it. Women should inform their doctor if they wish to become pregnant or think they might be pregnant. There is a potential for serious side effects to an unborn child. Talk to your health care professional or pharmacist for more information. What side effects may I notice from receiving this medicine? Side effects that you should report to your doctor or health care professional as soon as possible: -allergic reactions like skin rash, itching or hives, swelling of the face, lips, or tongue -breathing problems -chills -cough -dizziness -feeling faint or lightheaded -headache -low blood counts - this medicine may decrease the number of white blood cells, red blood cells and platelets. You may be at increased risk for infections and bleeding. -nausea, vomiting -shortness of breath -signs of decreased platelets or bleeding - bruising, pinpoint red spots on the skin, black, tarry stools, blood in the urine -signs of decreased red blood cells - unusually weak or tired, feeling faint or lightheaded, falls -signs of infection - fever or chills, cough, sore throat, pain or difficulty passing urine -signs and symptoms of liver injury like dark yellow or brown urine; general ill feeling or flu-like symptoms; light-colored stools; loss of appetite; right upper belly pain; unusually weak or tired; yellowing of the eyes or skin Side effects that usually do not require medical attention (report to your doctor or health care professional if they continue or are bothersome): -back pain -constipation -loss of appetite -diarrhea -joint pain -muscle cramps -pain, tingling, numbness in the hands or feet -swelling of the ankles, feet, hands -tiredness -trouble sleeping This list may not describe all possible side effects. Call your doctor for medical advice about side effects. You may report side  effects to FDA at 1-800-FDA-1088. Where should I keep my medicine? Keep out of the reach of children. This drug is given in a hospital or clinic and will not be stored at home. NOTE: This sheet is a summary. It may not cover all possible information. If you have questions about this medicine, talk to your doctor, pharmacist, or health care provider.  2019 [employer] (2018-05-04 15:52:44)

## 2018-11-15 ENCOUNTER — Encounter: Payer: Self-pay | Admitting: Hematology

## 2018-11-16 ENCOUNTER — Telehealth: Payer: Self-pay

## 2018-11-16 NOTE — Telephone Encounter (Signed)
Per 1/29 los spoke with wife concerning the changing of his appointment to split days. They will pick a new schedule on his next visit

## 2018-11-20 ENCOUNTER — Telehealth: Payer: Self-pay | Admitting: *Deleted

## 2018-11-20 NOTE — Telephone Encounter (Signed)
Contacted patient to advise that referral for home care assessment faxed to [facility]. While on phone, wife stated patient is staying bed a lot - patient states ribs and back are painful and better when in bed. Per wife - patient states he thinks pain in back and ribs is from cancer cells attacking him. Taking approximately 4 pain pills (oxy) a day in addition to Fentanyl patch. Wife reports patient is very sleepy, poor appetite and not taking in many fluids. Advised her to encourage patient to increase fluid intake as tolerated. Advised wife that ensure or a similar option could be offered if patient tolerates it. Wife verbalized understanding.

## 2018-11-20 NOTE — Telephone Encounter (Signed)
Faxed referral to [facility] [phone] for home health nursing and PT eval. Received fax confirmation.

## 2018-11-21 ENCOUNTER — Inpatient Hospital Stay: Payer: Medicare Other

## 2018-11-21 ENCOUNTER — Telehealth: Payer: Self-pay | Admitting: *Deleted

## 2018-11-21 ENCOUNTER — Inpatient Hospital Stay: Payer: Medicare Other | Attending: Hematology

## 2018-11-21 VITALS — BP 114/66 | HR 86 | Temp 97.9°F | Resp 17 | Ht 73.0 in | Wt 228.5 lb

## 2018-11-21 DIAGNOSIS — C9000 Multiple myeloma not having achieved remission: Secondary | ICD-10-CM

## 2018-11-21 DIAGNOSIS — Z79899 Other long term (current) drug therapy: Secondary | ICD-10-CM | POA: Diagnosis not present

## 2018-11-21 DIAGNOSIS — Z23 Encounter for immunization: Secondary | ICD-10-CM | POA: Insufficient documentation

## 2018-11-21 DIAGNOSIS — Z5112 Encounter for antineoplastic immunotherapy: Secondary | ICD-10-CM | POA: Diagnosis not present

## 2018-11-21 DIAGNOSIS — Z7189 Other specified counseling: Secondary | ICD-10-CM

## 2018-11-21 DIAGNOSIS — G893 Neoplasm related pain (acute) (chronic): Secondary | ICD-10-CM | POA: Insufficient documentation

## 2018-11-21 DIAGNOSIS — C9002 Multiple myeloma in relapse: Secondary | ICD-10-CM

## 2018-11-21 LAB — CMP (CANCER CENTER ONLY)
ALT: 14 U/L (ref 0–44)
AST: 17 U/L (ref 15–41)
Albumin: 3.2 g/dL — ABNORMAL LOW (ref 3.5–5.0)
Alkaline Phosphatase: 98 U/L (ref 38–126)
Anion gap: 7 (ref 5–15)
BUN: 12 mg/dL (ref 8–23)
CO2: 26 mmol/L (ref 22–32)
Calcium: 10.5 mg/dL — ABNORMAL HIGH (ref 8.9–10.3)
Chloride: 101 mmol/L (ref 98–111)
Creatinine: 0.88 mg/dL (ref 0.61–1.24)
GFR, Est AFR Am: >60 mL/min (ref >60)
GFR, Estimated: >60 mL/min (ref >60)
Glucose, Bld: 113 mg/dL — ABNORMAL HIGH (ref 70–99)
Potassium: 3.9 mmol/L (ref 3.5–5.1)
Sodium: 134 mmol/L — ABNORMAL LOW (ref 135–145)
Total Bilirubin: 1 mg/dL (ref 0.3–1.2)
Total Protein: 9.5 g/dL — ABNORMAL HIGH (ref 6.5–8.1)

## 2018-11-21 LAB — CBC WITH DIFFERENTIAL/PLATELET
Abs Immature Granulocytes: 0.03 10*3/uL (ref 0.00–0.07)
Basophils Absolute: 0 10*3/uL (ref 0.0–0.1)
Basophils Relative: 0 %
Eosinophils Absolute: 0 10*3/uL (ref 0.0–0.5)
Eosinophils Relative: 1 %
HCT: 32.8 % — ABNORMAL LOW (ref 39.0–52.0)
HEMOGLOBIN: 11.1 g/dL — AB (ref 13.0–17.0)
Immature Granulocytes: 1 %
Lymphocytes Relative: 9 %
Lymphs Abs: 0.3 10*3/uL — ABNORMAL LOW (ref 0.7–4.0)
MCH: 32.3 pg (ref 26.0–34.0)
MCHC: 33.8 g/dL (ref 30.0–36.0)
MCV: 95.3 fL (ref 80.0–100.0)
MONO ABS: 0.3 10*3/uL (ref 0.1–1.0)
Monocytes Relative: 8 %
Neutro Abs: 2.5 10*3/uL (ref 1.7–7.7)
Neutrophils Relative %: 81 %
Platelets: 189 10*3/uL (ref 150–400)
RBC: 3.44 MIL/uL — ABNORMAL LOW (ref 4.22–5.81)
RDW: 15.4 % (ref 11.5–15.5)
WBC: 3.1 10*3/uL — ABNORMAL LOW (ref 4.0–10.5)
nRBC: 0 % (ref 0.0–0.2)

## 2018-11-21 MED ORDER — ACETAMINOPHEN 325 MG PO TABS
650.0000 mg | ORAL_TABLET | Freq: Once | ORAL | Status: AC
  Administered 2018-11-21: 650 mg via ORAL

## 2018-11-21 MED ORDER — FAMOTIDINE IN NACL 20-0.9 MG/50ML-% IV SOLN
INTRAVENOUS | Status: AC
  Filled 2018-11-21: qty 50

## 2018-11-21 MED ORDER — ZOLEDRONIC ACID 4 MG/100ML IV SOLN
4.0000 mg | Freq: Once | INTRAVENOUS | Status: AC
  Administered 2018-11-21: 4 mg via INTRAVENOUS
  Filled 2018-11-21: qty 100

## 2018-11-21 MED ORDER — MONTELUKAST SODIUM 10 MG PO TABS: 10.0000 mg | ORAL_TABLET | Freq: Every day | ORAL | Status: DC

## 2018-11-21 MED ORDER — METHYLPREDNISOLONE SODIUM SUCC 125 MG IJ SOLR
100.0000 mg | Freq: Once | INTRAMUSCULAR | Status: AC
  Administered 2018-11-21: 62.5 mg via INTRAVENOUS

## 2018-11-21 MED ORDER — MONTELUKAST SODIUM 10 MG PO TABS
10.0000 mg | ORAL_TABLET | Freq: Once | ORAL | Status: AC
  Administered 2018-11-21: 10 mg via ORAL

## 2018-11-21 MED ORDER — SODIUM CHLORIDE 0.9 % IV SOLN
15.6000 mg/kg | Freq: Once | INTRAVENOUS | Status: AC
  Administered 2018-11-21: 1700 mg via INTRAVENOUS
  Filled 2018-11-21: qty 80

## 2018-11-21 MED ORDER — INFLUENZA VAC SPLIT HIGH-DOSE 0.5 ML IM SUSY
0.5000 mL | PREFILLED_SYRINGE | INTRAMUSCULAR | Status: DC
  Filled 2018-11-21: qty 0.5

## 2018-11-21 MED ORDER — METHYLPREDNISOLONE SODIUM SUCC 125 MG IJ SOLR
INTRAMUSCULAR | Status: AC
  Filled 2018-11-21: qty 2

## 2018-11-21 MED ORDER — SODIUM CHLORIDE 0.9 % IV SOLN: 16.0000 mg/kg | Freq: Once | INTRAVENOUS | Status: DC

## 2018-11-21 MED ORDER — FAMOTIDINE IN NACL 20-0.9 MG/50ML-% IV SOLN
20.0000 mg | Freq: Once | INTRAVENOUS | Status: AC
  Administered 2018-11-21: 20 mg via INTRAVENOUS

## 2018-11-21 MED ORDER — DIPHENHYDRAMINE HCL 25 MG PO CAPS
50.0000 mg | ORAL_CAPSULE | Freq: Once | ORAL | Status: AC
  Administered 2018-11-21: 50 mg via ORAL

## 2018-11-21 MED ORDER — MONTELUKAST SODIUM 10 MG PO TABS
ORAL_TABLET | ORAL | Status: AC
  Filled 2018-11-21: qty 1

## 2018-11-21 MED ORDER — ACETAMINOPHEN 325 MG PO TABS
ORAL_TABLET | ORAL | Status: AC
  Filled 2018-11-21: qty 2

## 2018-11-21 MED ORDER — SODIUM CHLORIDE 0.9 % IV SOLN
Freq: Once | INTRAVENOUS | Status: AC
  Administered 2018-11-21: 09:00:00 via INTRAVENOUS
  Filled 2018-11-21: qty 250

## 2018-11-21 MED ORDER — DIPHENHYDRAMINE HCL 25 MG PO CAPS
ORAL_CAPSULE | ORAL | Status: AC
  Filled 2018-11-21: qty 2

## 2018-11-21 MED ORDER — INFLUENZA VAC SPLIT HIGH-DOSE 0.5 ML IM SUSY
0.5000 mL | PREFILLED_SYRINGE | Freq: Once | INTRAMUSCULAR | Status: AC
  Administered 2018-11-21: 0.5 mL via INTRAMUSCULAR
  Filled 2018-11-21: qty 0.5

## 2018-11-21 NOTE — Patient Instructions (Addendum)
[facility] Discharge Instructions for Patients Receiving Chemotherapy  Today you received the following chemotherapy agents: Daratumumab (Darzalex)  To help prevent nausea and vomiting after your treatment, we encourage you to take your nausea medication as directed.    If you develop nausea and vomiting that is not controlled by your nausea medication, call the clinic.   BELOW ARE SYMPTOMS THAT SHOULD BE REPORTED IMMEDIATELY:  *FEVER GREATER THAN 100.5 F  *CHILLS WITH OR WITHOUT FEVER  NAUSEA AND VOMITING THAT IS NOT CONTROLLED WITH YOUR NAUSEA MEDICATION  *UNUSUAL SHORTNESS OF BREATH  *UNUSUAL BRUISING OR BLEEDING  TENDERNESS IN MOUTH AND THROAT WITH OR WITHOUT PRESENCE OF ULCERS  *URINARY PROBLEMS  *BOWEL PROBLEMS  UNUSUAL RASH Items with * indicate a potential emergency and should be followed up as soon as possible.  Feel free to call the clinic should you have any questions or concerns. The clinic phone number is [phone].  Please show the [facility] at check-in to the Emergency Department and triage nurse.  Zoledronic Acid injection (Hypercalcemia, Oncology) What is this medicine? ZOLEDRONIC ACID (ZOE le dron ik AS id) lowers the amount of calcium loss from bone. It is used to treat too much calcium in your blood from cancer. It is also used to prevent complications of cancer that has spread to the bone. This medicine may be used for other purposes; ask your health care provider or pharmacist if you have questions. COMMON BRAND NAME(S): Zometa What should I tell my health care provider before I take this medicine? They need to know if you have any of these conditions: -aspirin-sensitive asthma -cancer, especially if you are receiving medicines used to treat cancer -dental disease or wear dentures -infection -kidney disease -receiving corticosteroids like dexamethasone or prednisone -an unusual or allergic reaction to zoledronic acid,  other medicines, foods, dyes, or preservatives -pregnant or trying to get pregnant -breast-feeding How should I use this medicine? This medicine is for infusion into a vein. It is given by a health care professional in a hospital or clinic setting. Talk to your pediatrician regarding the use of this medicine in children. Special care may be needed. Overdosage: If you think you have taken too much of this medicine contact a poison control center or emergency room at once. NOTE: This medicine is only for you. Do not share this medicine with others. What if I miss a dose? It is important not to miss your dose. Call your doctor or health care professional if you are unable to keep an appointment. What may interact with this medicine? -certain antibiotics given by injection -NSAIDs, medicines for pain and inflammation, like ibuprofen or naproxen -some diuretics like bumetanide, furosemide -teriparatide -thalidomide This list may not describe all possible interactions. Give your health care provider a list of all the medicines, herbs, non-prescription drugs, or dietary supplements you use. Also tell them if you smoke, drink alcohol, or use illegal drugs. Some items may interact with your medicine. What should I watch for while using this medicine? Visit your doctor or health care professional for regular checkups. It may be some time before you see the benefit from this medicine. Do not stop taking your medicine unless your doctor tells you to. Your doctor may order blood tests or other tests to see how you are doing. Women should inform their doctor if they wish to become pregnant or think they might be pregnant. There is a potential for serious side effects to an unborn child.  Talk to your health care professional or pharmacist for more information. You should make sure that you get enough calcium and vitamin D while you are taking this medicine. Discuss the foods you eat and the vitamins you take  with your health care professional. Some people who take this medicine have severe bone, joint, and/or muscle pain. This medicine may also increase your risk for jaw problems or a broken thigh bone. Tell your doctor right away if you have severe pain in your jaw, bones, joints, or muscles. Tell your doctor if you have any pain that does not go away or that gets worse. Tell your dentist and dental surgeon that you are taking this medicine. You should not have major dental surgery while on this medicine. See your dentist to have a dental exam and fix any dental problems before starting this medicine. Take good care of your teeth while on this medicine. Make sure you see your dentist for regular follow-up appointments. What side effects may I notice from receiving this medicine? Side effects that you should report to your doctor or health care professional as soon as possible: -allergic reactions like skin rash, itching or hives, swelling of the face, lips, or tongue -anxiety, confusion, or depression -breathing problems -changes in vision -eye pain -feeling faint or lightheaded, falls -jaw pain, especially after dental work -mouth sores -muscle cramps, stiffness, or weakness -redness, blistering, peeling or loosening of the skin, including inside the mouth -trouble passing urine or change in the amount of urine Side effects that usually do not require medical attention (report to your doctor or health care professional if they continue or are bothersome): -bone, joint, or muscle pain -constipation -diarrhea -fever -hair loss -irritation at site where injected -loss of appetite -nausea, vomiting -stomach upset -trouble sleeping -trouble swallowing -weak or tired This list may not describe all possible side effects. Call your doctor for medical advice about side effects. You may report side effects to FDA at 1-800-FDA-1088. Where should I keep my medicine? This drug is given in a hospital  or clinic and will not be stored at home. NOTE: This sheet is a summary. It may not cover all possible information. If you have questions about this medicine, talk to your doctor, pharmacist, or health care provider.  2019 Elsevier/Gold Standard (2014-03-01 14:19:39)  Influenza (Flu) Vaccine (Inactivated or Recombinant): What You Need to Know 1. Why get vaccinated? Influenza vaccine can prevent influenza (flu). Flu is a contagious disease that spreads around the [place] every year, usually between October and May. Anyone can get the flu, but it is more dangerous for some people. Infants and young children, people 63 years of age and older, pregnant women, and people with certain health conditions or a weakened immune system are at greatest risk of flu complications. Pneumonia, bronchitis, sinus infections and ear infections are examples of flu-related complications. If you have a medical condition, such as heart disease, cancer or diabetes, flu can make it worse. Flu can cause fever and chills, sore throat, muscle aches, fatigue, cough, headache, and runny or stuffy nose. Some people may have vomiting and diarrhea, though this is more common in children than adults. Each year thousands of people in the [place] die from flu, and many more are hospitalized. Flu vaccine prevents millions of illnesses and flu-related visits to the doctor each year. 2. Influenza vaccine CDC recommends everyone 79 months of age and older get vaccinated every flu season. Children 6 months through 8 years of  age may need 2 doses during a single flu season. Everyone else needs only 1 dose each flu season. It takes about 2 weeks for protection to develop after vaccination. There are many flu viruses, and they are always changing. Each year a new flu vaccine is made to protect against three or four viruses that are likely to cause disease in the upcoming flu season. Even when the vaccine doesn't exactly match these  viruses, it may still provide some protection. Influenza vaccine does not cause flu. Influenza vaccine may be given at the same time as other vaccines. 3. Talk with your health care provider Tell your vaccine provider if the person getting the vaccine:  Has had an allergic reaction after a previous dose of influenza vaccine, or has any severe, life-threatening allergies.  Has ever had Guillain-Barr Syndrome (also called GBS). In some cases, your health care provider may decide to postpone influenza vaccination to a future visit. People with minor illnesses, such as a cold, may be vaccinated. People who are moderately or severely ill should usually wait until they recover before getting influenza vaccine. Your health care provider can give you more information. 4. Risks of a vaccine reaction  Soreness, redness, and swelling where shot is given, fever, muscle aches, and headache can happen after influenza vaccine.  There may be a very small increased risk of Guillain-Barr Syndrome (GBS) after inactivated influenza vaccine (the flu shot). Young children who get the flu shot along with pneumococcal vaccine (PCV13), and/or DTaP vaccine at the same time might be slightly more likely to have a seizure caused by fever. Tell your health care provider if a child who is getting flu vaccine has ever had a seizure. People sometimes faint after medical procedures, including vaccination. Tell your provider if you feel dizzy or have vision changes or ringing in the ears. As with any medicine, there is a very remote chance of a vaccine causing a severe allergic reaction, other serious injury, or death. 5. What if there is a serious problem? An allergic reaction could occur after the vaccinated person leaves the clinic. If you see signs of a severe allergic reaction (hives, swelling of the face and throat, difficulty breathing, a fast heartbeat, dizziness, or weakness), call 9-1-1 and get the person to the  nearest hospital. For other signs that concern you, call your health care provider. Adverse reactions should be reported to the Vaccine Adverse Event Reporting System (VAERS). Your health care provider will usually file this report, or you can do it yourself. Visit the VAERS website at www.vaers.SamedayNews.es or call 4011960123.VAERS is only for reporting reactions, and VAERS staff do not give medical advice. 6. The National Vaccine Injury Compensation Program The [employer] Vaccine Injury Compensation Program (VICP) is a federal program that was created to compensate people who may have been injured by certain vaccines. Visit the VICP website at GoldCloset.com.ee or call (332)805-4582 to learn about the program and about filing a claim. There is a time limit to file a claim for compensation. 7. How can I learn more?  Ask your healthcare provider.  Call your local or state health department.  Contact the Centers for Disease Control and Prevention (CDC): ? Call 534-693-2838 (1-800-CDC-INFO) or ? Visit CDC's https://gibson.com/ Vaccine Information Statement (Interim) Inactivated Influenza Vaccine (05/31/2018) This information is not intended to replace advice given to you by your health care provider. Make sure you discuss any questions you have with your health care provider. Document Released: 07/28/2006 Document Revised: 06/04/2018 Document  Reviewed: 06/04/2018 Elsevier Interactive Patient Education  [employer].

## 2018-11-21 NOTE — Progress Notes (Signed)
MD ok'd for Darzalex to be changed to rapid administration. Kennith Center, Pharm.D., CPP 11/21/2018@1 :30 PM

## 2018-11-21 NOTE — Telephone Encounter (Signed)
Contacted by karen with [facility] regarding Dr. Grier Mitts referral for in home nursing and physical therapy.  Per Santiago Glad, they can only send physical therapy at this time, but due to shortage of nursing staff, cannot send a nurse right now - but will send one once a nurse is available. Per Dr. Irene Limbo, go ahead and send physical therapy at this time and send nurse as soon as one is available. Contacted Santiago Glad to give Dr. Grier Mitts message, she verbalized understanding.

## 2018-11-22 ENCOUNTER — Encounter (HOSPITAL_COMMUNITY)
Admission: RE | Admit: 2018-11-22 | Discharge: 2018-11-22 | Disposition: A | Discharge status: Home / Self Care | Payer: Medicare Other | Source: Ambulatory Visit | Attending: Interventional Radiology | Admitting: Interventional Radiology

## 2018-11-22 DIAGNOSIS — C9002 Multiple myeloma in relapse: Secondary | ICD-10-CM | POA: Diagnosis not present

## 2018-11-22 MED ORDER — TECHNETIUM TC 99M MEDRONATE IV KIT
20.0000 | PACK | Freq: Once | INTRAVENOUS | Status: AC | PRN
  Administered 2018-11-22: 20 via INTRAVENOUS

## 2018-11-26 ENCOUNTER — Telehealth: Payer: Self-pay | Admitting: *Deleted

## 2018-11-26 NOTE — Telephone Encounter (Signed)
S/w [name] and relayed msg per Dr. Earleen Newport, there is no evidence of a fracture at the T12 level, no fracture to treat in his spine. [name] understands.Michael [name]

## 2018-11-26 NOTE — Progress Notes (Signed)
[name]    HEMATOLOGY/ONCOLOGY CLINIC NOTE  Date of Service: 11/27/18     Patient Care Team: [name], MD as PCP - General (Family Medicine) [name], MD as Consulting Physician (Hematology) [name], MD as Attending Physician (Radiation Oncology) [name], RN as Oncology Nurse Navigator (Oncology) [name] MD ([facility] _ primary oncology) phone number [phone] Email- [email or web address]  CHIEF COMPLAINTS  followup of multiple myeloma.  HISTORY OF PRESENTING ILLNESS:   Michael [name] is a wonderful 68 y.o. 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 Patient is currently on a study protocol and is being monitored for myeloma recurrence.  Based on available records being put together  -Patient was diagnosed with IgG kappa ISS stage I multiple myeloma with hyperdiploid complex cytogenetics in 2012. Bone marrow biopsy apparently showed 50% kappa restricted plasma cells with an initial M spike of 3.4 g/dL with evidence of lytic lesions on his PET/CT scan including right rib fracture and lesions of the lumbar and cervical spine. -Patient was treated under protocol 11-C-0221 with from 02/21/2013 with  Carfilzomib/Revlimid/Dexamethasone for a total of 8 cycles. He reports that his stem cells were collected and stored after 4 cycles -Posttreatment bone marrow biopsy showed less than 5% plasma cells with a negative flow cytometry and improvement in his previously PET avid lesions. Some concern for new focus of activity at C2 lytic lesion at L4 and several small lesions throughout the vertebrae. -Patient was on maintenance lenalidomide 10 mg by  mouth daily for 2 years or more until end of 2014. 08/19/2013 - bone marrow biopsy showed normocellular marrow with less than 5% plasma cells. PET CT scan showed decreased focal metabolic ligament prior rib lesions and no new lesions. Flow cytometry was negative for residual disease. Serum and urine electrophoresis and IFE showed no evidence of monoclonal protein. -Patient notes that he was off protocol for a few years due to lack of clinical trial research funding. -Patient notes that he is back on a follow-up protocol and continues to follow and receive his primary treatment at [facility] at this time. -Patient reports he had his last PET CT scan blood and urine tests and bone marrow examination in February 2018. The results of which are not available to [place] currently.  He notes that there was concern for a very slowly growing sternal FDG avid lesion that has been present for years. He has a follow-up at [facility] [place] next month to determine the management of this lesion. He reports that was some consideration of considering radiation. he notes that this lesion has not been biopsied. No other focal bone pains at this time.  We discussed in detail about what our role will be and noted that we shall be available for any acute issues that need to be taken care of locally but that at this time the primary treatment and evaluation will be driven by his team at [facility] as per their research protocols and input. The patient and his wife are clearly aware of this and are in agreement with this plan.  INTERVAL HISTORY  Michael [name] comes is here for continued management of his Multiple Myeloma and recently diagnosed Plasmacytoma. The patient's last visit with [place]  was on 11/14/18. He is accompanied today by his daughter. The pt reports that he is doing well overall.   The pt reports that he is continuing to have lower to mid back pain, just to the left of center. He also notes continued pain in his ribs which is  exacerbated by trying to stand up. He notes that his left shoulder hurts as well, which he notes began a couple weeks ago, and felt a "pop" when trying to lift himself from bed. He notes that he is moving his bowels once a day. He notes that he begun using his Fentanyl patch, in addition to roughly 6 Oxycodone per day. The pt notes that he sits in a recliner most of the day and that his pain is limiting. His daughter adds that he looks like he is straining and needing to contort his body just to stand up. He notes that he was evaluated by home care and was deemed to be functioning well enough to not have this service.  The pt notes that he is tolerating Daratumumab and Dexamethasone well. He denies fever, chills, nausea, vomiting, or diarrhea. He notes that he is eating well but will be meeting with our nutritional therapist tomorrow.  Lab results today (11/27/18) of CBC w/diff and CMP is as follows: all values are WNL except for WBC at 3.5k, RBC at 3.42, HGB at 10.9, HCT at 33.0, RDW at 15.6, Lymphs abs at 300, Glucose at 120, Total Protein at 9.0, Albumin at 3.3. 11/27/18 MMP is pending  On review of systems, pt reports moving his bowels, stable energy levels, mid back pain, eating well, and denies fevers, chills, night sweats, nausea, vomiting, diarrhea, specific pain along the spine, and any other symptoms.    MEDICAL HISTORY:  Past Medical History:  Diagnosis Date  . Angioedema   . CPAP (continuous positive airway pressure) dependence    uses at night  . Gastroesophageal reflux disease without esophagitis   . History of radiation therapy 06/04/18- 06/15/18   25 Gy in 10 fractions to the laryngeal/ cartilage lesion  . Hyperlipidemia   . Multiple myeloma ([place]) 2012   Treated at [facility]  . Non morbid obesity due to excess calories   . Prediabetes   . Prediabetes   . Unspecified glaucoma(365.9)   . Urticaria, unspecified   Diabetes mellitus Glaucoma Left toes neuropathy  GERD  SURGICAL  HISTORY: Past Surgical History:  Procedure Laterality Date  . IR RADIOLOGIST EVAL & MGMT  11/13/2018  . PORTACATH PLACEMENT    . PORTACATH PLACEMENT     removed 02/2016    SOCIAL HISTORY: Social History   Socioeconomic History  . Marital status: Married    Spouse name: Not on file  . Number of children: 2  . Years of education: Not on file  . Highest education level: Not on file  Occupational History  . Occupation: Retired Chief Strategy Officer for [employer]  . Financial resource strain: Not on file  . Food insecurity:    Worry: Not on file    Inability: Not on file  . Transportation needs:    Medical: Not on file    Non-medical: Not on file  Tobacco Use  . Smoking status: Never Smoker  . Smokeless tobacco: Never Used  Substance and Sexual Activity  . Alcohol use: Yes    Alcohol/week: 0.0 standard drinks    Comment: Occasional beer  . Drug use: No  . Sexual activity: Not on file  Lifestyle  . Physical activity:    Days per week: Not on file    Minutes per session: Not on file  . Stress: Not on file  Relationships  . Social connections:    Talks on phone: Not on file    Gets together: Not on file    Attends religious service: Not on file    Active member of club or organization: Not on file    Attends meetings of clubs or organizations: Not on file    Relationship status: Not on file  . Intimate partner violence:    Fear of current or ex partner: No    Emotionally abused: No    Physically abused: No    Forced sexual activity: No  Other Topics Concern  . Not on file  Social History Narrative   Unable to [employer] partner violence- partner in room   Never smoker Married Retired from the [employer] in June 2017 and moved to [place].  FAMILY HISTORY: Family History  Problem Relation Age of Onset  . Cancer Father     ALLERGIES:  has No Known Allergies.  MEDICATIONS:  Current Outpatient Medications  Medication Sig Dispense  Refill  . acyclovir (ZOVIRAX) 400 MG tablet Take 1 tablet (400 mg total) by mouth 2 (two) times daily. 60 tablet 11  . aspirin 81 MG tablet Take 81 mg by mouth daily.    [name] atorvastatin (LIPITOR) 20 MG tablet Take 20 mg by mouth every evening.     [name] dexamethasone (DECADRON) 4 MG tablet 13m (5 tabs) with breakfast the day after each daratumumab treatment 20 tablet 4  . diazepam (VALIUM) 5 MG tablet Take 1 tablet (5 mg total) by mouth every 6 (six) hours as needed for muscle spasms. 10 tablet 0  . fentaNYL (DURAGESIC) 12 MCG/HR Place 1 patch onto the skin every 3 (three) days for 10 doses. 10 patch 0  . Lido-Capsaicin-Men-Methyl Sal (MEDI-PATCH-LIDOCAINE) 0.5-0.035-5-20 % PTCH Apply 1 patch topically 2 (two) times daily as needed. 30 patch 0  . Multiple Vitamin (MULTIVITAMIN) tablet Take 1 tablet by mouth daily.    .[name] Kitchennystatin (MYCOSTATIN/NYSTOP) powder Apply 100,000 g topically daily as needed.    . ondansetron (ZOFRAN) 8 MG tablet Take 1 tablet (8 mg total) by mouth 2 (two) times daily as needed (Nausea or vomiting). 30 tablet 1  . oxyCODONE-acetaminophen (PERCOCET) 5-325 MG tablet Take 1-2 tablets by mouth every 4 (four) hours as needed. 60 tablet 0  . pantoprazole (PROTONIX) 40 MG tablet Take 40 mg by mouth daily.     . polyethylene glycol (MIRALAX) packet Take 17 g by mouth daily.    . prochlorperazine (COMPAZINE) 10 MG tablet Take 1 tablet (10 mg total) by mouth every 6 (six) hours as needed (Nausea or vomiting). 30 tablet 1  . senna-docusate (SENOKOT-S) 8.6-50 MG tablet Take 2 tablets by mouth at bedtime. Takes 50 mg 60 tablet 0  . sodium chloride 0.9 % SOLN 250 mL with avelumab 200 MG/10ML SOLN 10 mg/kg Inject 800 mg/kg into the vein every 14 (fourteen) days.     No current facility-administered medications for this visit.     REVIEW OF SYSTEMS:    A 10+ POINT REVIEW OF SYSTEMS WAS OBTAINED including neurology, dermatology, psychiatry, cardiac, respiratory, lymph, extremities, GI, GU,  Musculoskeletal, constitutional, breasts, reproductive, HEENT.  All pertinent positives are noted in the HPI.  All others are negative.   PHYSICAL EXAMINATION: ECOG PERFORMANCE STATUS: 1 - Symptomatic but  completely ambulatory  Vitals:   11/27/18 1141  BP: (!) 144/56  Pulse: 86  Resp: 18  Temp: 99.4 F (37.4 C)  TempSrc: Oral  SpO2: 99%  Height: 6' 1"  (1.854 m)   Body mass index is 30.15 kg/m.   GENERAL:alert, in no acute distress and comfortable SKIN: no acute rashes, no significant lesions EYES: conjunctiva are pink and non-injected, sclera anicteric OROPHARYNX: MMM, no exudates, no oropharyngeal erythema or ulceration NECK: supple, no JVD LYMPH:  no palpable lymphadenopathy in the cervical, axillary or inguinal regions LUNGS: clear to auscultation b/l with normal respiratory effort HEART: regular rate & rhythm ABDOMEN:  normoactive bowel sounds , non tender, not distended. No palpable hepatosplenomegaly.  Extremity: no pedal edema PSYCH: alert & oriented x 3 with fluent speech NEURO: no focal motor/sensory deficits    LABORATORY DATA:  I have reviewed the data as listed  . CBC Latest Ref Rng & Units 11/27/2018 11/21/2018 11/14/2018  WBC 4.0 - 10.5 K/uL 3.5(L) 3.1(L) 2.3(L)  Hemoglobin 13.0 - 17.0 g/dL 10.9(L) 11.1(L) 10.0(L)  Hematocrit 39.0 - 52.0 % 33.0(L) 32.8(L) 29.7(L)  Platelets 150 - 400 K/uL 214 189 158  ANC 1200  . CMP Latest Ref Rng & Units 11/27/2018 11/21/2018 11/14/2018  Glucose 70 - 99 mg/dL 120(H) 113(H) 110(H)  BUN 8 - 23 mg/dL 14 12 11   Creatinine 0.61 - 1.24 mg/dL 0.86 0.88 0.94  Sodium 135 - 145 mmol/L 136 134(L) 134(L)  Potassium 3.5 - 5.1 mmol/L 4.0 3.9 4.2  Chloride 98 - 111 mmol/L 103 101 103  CO2 22 - 32 mmol/L 27 26 26   Calcium 8.9 - 10.3 mg/dL 9.3 10.5(H) 10.1  Total Protein 6.5 - 8.1 g/dL 9.0(H) 9.5(H) 9.7(H)  Total Bilirubin 0.3 - 1.2 mg/dL 0.8 1.0 0.8  Alkaline Phos 38 - 126 U/L 116 98 93  AST 15 - 41 U/L 19 17 17   ALT 0 - 44 U/L 12 14  12     05/08/18 Left Neck Thyroid Cartilage Biopsy:        RADIOGRAPHIC STUDIES: I have personally reviewed the radiological images as listed and agreed with the findings in the report. Nm Bone W/spect  Result Date: 11/23/2018 CLINICAL DATA:  Multiple myeloma in relapse. History of T12 compression fracture. EXAM: NM BONE SCAN AND SPECT IMAGING TECHNIQUE: After intravenous injection of radiopharmaceutical, delayed planar images were obtained in multiple projections. Additionally, delayed triplanar SPECT images were obtained through the area of interest. RADIOPHARMACEUTICALS:  21.0 mCi Tc-32mMDP COMPARISON:  PET-CT 05/22/2018. Lumbar spine radiographs 10/15/2018. Images only from outside PET-CT 10/02/2018 and 10/18/2018. The reports from these outside studies are not provided. FINDINGS: This examination concentrates on the mid trunk, extending from the upper thoracic region into the mid pelvis. The extremities are not included. There is patchy uptake throughout the ribs and spine. Focally increased activity is noted in the region of the T12 pedicles bilaterally as well as the right L1 pedicle. There is no clear corresponding fracture on the comparison studies. Soft tissue activity appears unremarkable. IMPRESSION: Patchy uptake throughout the ribs and spine with areas of focally increased activity in both T12 pedicles and the right L1 pedicle. This focal activity could be stress related without clear corresponding fracture on available prior studies. Electronically Signed   By: WRichardean SaleM.D.   On: 11/23/2018 08:54   Ir Radiologist Eval & Mgmt  Result Date: 11/13/2018 Please refer to notes tab for details about interventional procedure. 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 Bone marrow biopsy apparently showed  50% kappa restricted plasma cells with an initial M spike of 3.4 g/dL with evidence of lytic lesions on his PET/CT scan including right rib fracture and lesions of the lumbar and cervical spine. -Patient was treated under protocol 11-C-0221 with from 02/21/2013 with  Carfilzomib/Revlimid/Dexamethasone for a total of 8 cycles. He reports that his stem cells were collected and stored after 4 cycles -Posttreatment bone marrow biopsy showed less than 5% plasma cells with a negative flow cytometry and improvement in his previously PET avid lesions. Some concern for new focus of activity at C2 lytic lesion at L4 and several small lesions throughout the vertebrae. -Patient was on maintenance lenalidomide 10 mg by mouth daily for 2 years or more until end of 2014. 08/19/2013 - bone marrow biopsy showed normocellular marrow with less than 5% plasma cells. PET CT scan showed decreased focal metabolic ligament prior rib lesions and no new lesions. Flow cytometry was negative for residual disease. Serum and urine electrophoresis and IFE showed no evidence of monoclonal protein. -Patient notes that he was off protocol for a few years due to lack of clinical trial research funding. -Patient returned to follow-up protocol and began to follow and receive his primary treatment at [facility]  -Patient's labs show minimal IgG kappa protein on IFE and a CT chest in May 2018 that did not show any overt signs of myeloma progression in the bones.  Rpt Myeloma labs 03/2017 - M spike of 0.3g/dl with a repeat M spike stable at 0.3 g/dL on 05/23/2017 which remains stable on labs today 07/25/17 Serum kappa lambda free light chain ratio not significantly changed. PET/CT scan showed a single metabolically active sternal lesion without any overt bony destruction. Bone marrow biopsy done on 06/06/2017 - shows no overt involvement with multiple myeloma.  04/24/18 CT Soft Tissue Neck revealed Left laryngeal 3 cm soft tissue mass appears to have  originated within and is expanding the left thyroid cartilage. This is new since the August 2018 PET-CT, and there is absent lymphadenopathy. 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 Primary cartilaginous neoplasm, squamous cell carcinoma, and other differential considerations are less likely. Superimposed widespread multiple myeloma lesions in the visible skeleton.  05/08/18 Left neck thyroid cartilage biopsy revealed Plasma cell neoplasm.  05/22/18 PET/CT revealed Soft tissue mass centered around the destroyed left thyroid cartilage is hypermetabolic and consistent with known plasmacytoma. No adenopathy in the neck. 2. Hypermetabolic 3 cm cutaneous and subcutaneous soft tissue mass involving the posterior upper thorax suspicious for cutaneous manifestation of myeloma (plasmacytoma). 3. Persistent and slightly progressive hypermetabolic sternal lesion. 4. Diffuse lytic myelomatous lesions throughout the bony structures but no other hypermetabolic foci. 5. Focus of hypermetabolism in the left aspect of the lower prostate gland, not present on the prior study. This could be an area of infection/inflammation or potential prostate cancer. Recommend correlation with physical examination and PSA level.    Completed RT between 06/04/18 and 06/15/18 of 25 Gy by 10 fractions  The pt pursued a clinical trial at the [facility] with RT and immunotherapy in October 2019 through October 18, 2018 with further information available at [email or web address]  10/18/18 M Protein increased to 3.5g, from 0.7g at our last visit on 07/10/18. 10/02/18 PET/CT from [facility] indicated new hypermetabolism in left  clavicle, left scapula, bilateral ribs, spine, and left proximal femur 10/18/18 MRI from [facility] ndicated a pathologic fracture at T12   2) h/o Elevated bilirubin level .   3) PET/CT abnormality in the Prostate. PSA levels WNL -Primary care physician to consider urology  referral  4) Abnormal focus of FDG avid at a spot in the liver - will need to be monitor on f/u scans. No pain or other symptoms at this time.  PLAN:   -Discussed pt labwork today, 11/27/18; blood counts and chemistries are stable -Discussed the 11/23/18 NM Bone which revealed Patchy uptake throughout the ribs and spine with areas of focally increased activity in both T12 pedicles and the right L1 pedicle. This focal activity could be stress related without clear corresponding fracture on available prior studies. -11/27/18 MMP is pending. Last available M spike from 11/06/18 is M Protein at 4.1 -Optimizing pain management, providing referral to PT again, and adding additional treatment -Referral to outpatient PT -The pt has no prohibitive toxicities from continuing C1D21 Daratumumab and Dexamethasone at this time. -Will add Carfilzomib in one week as the pt has tolerated Daratumumab and Dexamethasone well -Increasing Fentanyl patch to 34mg -Baseline laxative use of 2 pills Senna S at night, and one Miralax. Back off if diarrhea develops. -100-150cc Magnesium citrate if no bowel movement is achieved after 2-3 days -Flexeril prn for muscle relaxation, pt will use this minimally and infrequently. Stop Valium, which pt was using infrequently. -Continue Zometa every 4 weeks -Consume at least 60oz of water each day -Continue Acyclovir -Discussed Port placement- pt would like to wait on this -Continue salt and baking soda mouthwashes -Will see the pt back in 2 weeks   -Plz schedule to add Carfilzomib from next treatment weekly. -continue weekly labs -plz schedule C2 of daratumumab as ordered  -RTC with Dr KIrene Limboin 2 weeks -referral to outpatient cancer rehab    All of the patients questions were answered with apparent satisfaction. The patient knows to call the clinic with any problems, questions or concerns.  The total time spent in the appt was 40 minutes and more than 50% was on counseling  and direct patient cares.    GSullivan LoneMD MBlucksberg MountainAAHIVMS SKosair Children'S HospitalCGrand Teton Surgical Center LLCHematology/Oncology Physician CPal[facility] (Office):       [phone](Work cell):  [phone](Fax):           [phone] I, SBaldwin Jamaica am acting as a scribe for Dr. GSullivan Lone   .I have reviewed the above documentation for accuracy and completeness, and I agree with the above. .Brunetta GeneraMD

## 2018-11-27 ENCOUNTER — Inpatient Hospital Stay: Payer: Medicare Other

## 2018-11-27 ENCOUNTER — Inpatient Hospital Stay (HOSPITAL_BASED_OUTPATIENT_CLINIC_OR_DEPARTMENT_OTHER): Payer: Medicare Other | Admitting: Hematology

## 2018-11-27 VITALS — BP 144/56 | HR 86 | Temp 99.4°F | Resp 18 | Ht 73.0 in

## 2018-11-27 DIAGNOSIS — Z23 Encounter for immunization: Secondary | ICD-10-CM | POA: Diagnosis not present

## 2018-11-27 DIAGNOSIS — C9000 Multiple myeloma not having achieved remission: Secondary | ICD-10-CM

## 2018-11-27 DIAGNOSIS — G893 Neoplasm related pain (acute) (chronic): Secondary | ICD-10-CM | POA: Diagnosis not present

## 2018-11-27 DIAGNOSIS — C9002 Multiple myeloma in relapse: Secondary | ICD-10-CM | POA: Diagnosis not present

## 2018-11-27 DIAGNOSIS — Z5112 Encounter for antineoplastic immunotherapy: Secondary | ICD-10-CM | POA: Diagnosis not present

## 2018-11-27 DIAGNOSIS — Z79899 Other long term (current) drug therapy: Secondary | ICD-10-CM | POA: Diagnosis not present

## 2018-11-27 LAB — CBC WITH DIFFERENTIAL/PLATELET
ABS IMMATURE GRANULOCYTES: 0.01 10*3/uL (ref 0.00–0.07)
Basophils Absolute: 0 10*3/uL (ref 0.0–0.1)
Basophils Relative: 0 %
Eosinophils Absolute: 0.1 10*3/uL (ref 0.0–0.5)
Eosinophils Relative: 2 %
HCT: 33 % — ABNORMAL LOW (ref 39.0–52.0)
Hemoglobin: 10.9 g/dL — ABNORMAL LOW (ref 13.0–17.0)
Immature Granulocytes: 0 %
Lymphocytes Relative: 9 %
Lymphs Abs: 0.3 10*3/uL — ABNORMAL LOW (ref 0.7–4.0)
MCH: 31.9 pg (ref 26.0–34.0)
MCHC: 33 g/dL (ref 30.0–36.0)
MCV: 96.5 fL (ref 80.0–100.0)
Monocytes Absolute: 0.2 10*3/uL (ref 0.1–1.0)
Monocytes Relative: 7 %
NEUTROS ABS: 2.9 10*3/uL (ref 1.7–7.7)
NEUTROS PCT: 82 %
Platelets: 214 10*3/uL (ref 150–400)
RBC: 3.42 MIL/uL — ABNORMAL LOW (ref 4.22–5.81)
RDW: 15.6 % — ABNORMAL HIGH (ref 11.5–15.5)
WBC: 3.5 10*3/uL — ABNORMAL LOW (ref 4.0–10.5)
nRBC: 0 % (ref 0.0–0.2)

## 2018-11-27 LAB — CMP (CANCER CENTER ONLY)
ALT: 12 U/L (ref 0–44)
AST: 19 U/L (ref 15–41)
Albumin: 3.3 g/dL — ABNORMAL LOW (ref 3.5–5.0)
Alkaline Phosphatase: 116 U/L (ref 38–126)
Anion gap: 6 (ref 5–15)
BILIRUBIN TOTAL: 0.8 mg/dL (ref 0.3–1.2)
BUN: 14 mg/dL (ref 8–23)
CO2: 27 mmol/L (ref 22–32)
Calcium: 9.3 mg/dL (ref 8.9–10.3)
Chloride: 103 mmol/L (ref 98–111)
Creatinine: 0.86 mg/dL (ref 0.61–1.24)
GFR, Est AFR Am: >60 mL/min (ref >60)
GFR, Estimated: >60 mL/min (ref >60)
Glucose, Bld: 120 mg/dL — ABNORMAL HIGH (ref 70–99)
Potassium: 4 mmol/L (ref 3.5–5.1)
Sodium: 136 mmol/L (ref 135–145)
Total Protein: 9 g/dL — ABNORMAL HIGH (ref 6.5–8.1)

## 2018-11-27 MED ORDER — OXYCODONE HCL 5 MG PO TABS: 5.0000 mg | ORAL_TABLET | ORAL | 0 refills | Status: DC | PRN

## 2018-11-27 MED ORDER — FENTANYL 25 MCG/HR TD PT72: 1.0000 | MEDICATED_PATCH | TRANSDERMAL | 0 refills | Status: DC

## 2018-11-28 ENCOUNTER — Inpatient Hospital Stay: Payer: Medicare Other | Admitting: Nutrition

## 2018-11-28 ENCOUNTER — Inpatient Hospital Stay: Payer: Medicare Other

## 2018-11-28 ENCOUNTER — Other Ambulatory Visit: Payer: Medicare Other

## 2018-11-28 VITALS — BP 108/67 | HR 79 | Temp 98.7°F | Resp 18

## 2018-11-28 DIAGNOSIS — G893 Neoplasm related pain (acute) (chronic): Secondary | ICD-10-CM | POA: Diagnosis not present

## 2018-11-28 DIAGNOSIS — C9000 Multiple myeloma not having achieved remission: Secondary | ICD-10-CM

## 2018-11-28 DIAGNOSIS — Z79899 Other long term (current) drug therapy: Secondary | ICD-10-CM | POA: Diagnosis not present

## 2018-11-28 DIAGNOSIS — C9002 Multiple myeloma in relapse: Secondary | ICD-10-CM | POA: Diagnosis not present

## 2018-11-28 DIAGNOSIS — Z5112 Encounter for antineoplastic immunotherapy: Secondary | ICD-10-CM | POA: Diagnosis not present

## 2018-11-28 DIAGNOSIS — Z7189 Other specified counseling: Secondary | ICD-10-CM

## 2018-11-28 DIAGNOSIS — Z23 Encounter for immunization: Secondary | ICD-10-CM | POA: Diagnosis not present

## 2018-11-28 MED ORDER — DIPHENHYDRAMINE HCL 25 MG PO CAPS
ORAL_CAPSULE | ORAL | Status: AC
  Filled 2018-11-28: qty 2

## 2018-11-28 MED ORDER — ACETAMINOPHEN 325 MG PO TABS
ORAL_TABLET | ORAL | Status: AC
  Filled 2018-11-28: qty 2

## 2018-11-28 MED ORDER — SODIUM CHLORIDE 0.9 % IV SOLN
15.6000 mg/kg | Freq: Once | INTRAVENOUS | Status: AC
  Administered 2018-11-28: 1700 mg via INTRAVENOUS
  Filled 2018-11-28: qty 80

## 2018-11-28 MED ORDER — MONTELUKAST SODIUM 10 MG PO TABS
ORAL_TABLET | ORAL | Status: AC
  Filled 2018-11-28: qty 1

## 2018-11-28 MED ORDER — FAMOTIDINE IN NACL 20-0.9 MG/50ML-% IV SOLN
20.0000 mg | Freq: Once | INTRAVENOUS | Status: AC
  Administered 2018-11-28: 20 mg via INTRAVENOUS

## 2018-11-28 MED ORDER — MONTELUKAST SODIUM 10 MG PO TABS
10.0000 mg | ORAL_TABLET | Freq: Once | ORAL | Status: AC
  Administered 2018-11-28: 10 mg via ORAL

## 2018-11-28 MED ORDER — SODIUM CHLORIDE 0.9 % IV SOLN
Freq: Once | INTRAVENOUS | Status: AC
  Administered 2018-11-28: 08:00:00 via INTRAVENOUS
  Filled 2018-11-28: qty 250

## 2018-11-28 MED ORDER — DIPHENHYDRAMINE HCL 25 MG PO CAPS
50.0000 mg | ORAL_CAPSULE | Freq: Once | ORAL | Status: AC
  Administered 2018-11-28: 50 mg via ORAL

## 2018-11-28 MED ORDER — ACETAMINOPHEN 325 MG PO TABS
650.0000 mg | ORAL_TABLET | Freq: Once | ORAL | Status: AC
  Administered 2018-11-28: 650 mg via ORAL

## 2018-11-28 MED ORDER — METHYLPREDNISOLONE SODIUM SUCC 125 MG IJ SOLR
INTRAMUSCULAR | Status: AC
  Filled 2018-11-28: qty 2

## 2018-11-28 MED ORDER — FAMOTIDINE IN NACL 20-0.9 MG/50ML-% IV SOLN
INTRAVENOUS | Status: AC
  Filled 2018-11-28: qty 50

## 2018-11-28 MED ORDER — FAMOTIDINE 20 MG PO TABS
ORAL_TABLET | ORAL | Status: AC
  Filled 2018-11-28: qty 1

## 2018-11-28 MED ORDER — METHYLPREDNISOLONE SODIUM SUCC 125 MG IJ SOLR
100.0000 mg | Freq: Once | INTRAMUSCULAR | Status: AC
  Administered 2018-11-28: 100 mg via INTRAVENOUS

## 2018-11-28 NOTE — Patient Instructions (Signed)
[facility] Discharge Instructions for Patients Receiving Chemotherapy  Today you received the following chemotherapy agents: Daratumumab (Darzalex)  To help prevent nausea and vomiting after your treatment, we encourage you to take your nausea medication as directed.    If you develop nausea and vomiting that is not controlled by your nausea medication, call the clinic.   BELOW ARE SYMPTOMS THAT SHOULD BE REPORTED IMMEDIATELY:  *FEVER GREATER THAN 100.5 F  *CHILLS WITH OR WITHOUT FEVER  NAUSEA AND VOMITING THAT IS NOT CONTROLLED WITH YOUR NAUSEA MEDICATION  *UNUSUAL SHORTNESS OF BREATH  *UNUSUAL BRUISING OR BLEEDING  TENDERNESS IN MOUTH AND THROAT WITH OR WITHOUT PRESENCE OF ULCERS  *URINARY PROBLEMS  *BOWEL PROBLEMS  UNUSUAL RASH Items with * indicate a potential emergency and should be followed up as soon as possible.  Feel free to call the clinic should you have any questions or concerns. The clinic phone number is [phone].  Please show the [place] at check-in to the Emergency Department and triage nurse.  Zoledronic Acid injection (Hypercalcemia, Oncology) What is this medicine? ZOLEDRONIC ACID (ZOE le dron ik AS id) lowers the amount of calcium loss from bone. It is used to treat too much calcium in your blood from cancer. It is also used to prevent complications of cancer that has spread to the bone. This medicine may be used for other purposes; ask your health care provider or pharmacist if you have questions. COMMON BRAND NAME(S): Zometa What should I tell my health care provider before I take this medicine? They need to know if you have any of these conditions: -aspirin-sensitive asthma -cancer, especially if you are receiving medicines used to treat cancer -dental disease or wear dentures -infection -kidney disease -receiving corticosteroids like dexamethasone or prednisone -an unusual or allergic reaction to zoledronic acid,  other medicines, foods, dyes, or preservatives -pregnant or trying to get pregnant -breast-feeding How should I use this medicine? This medicine is for infusion into a vein. It is given by a health care professional in a hospital or clinic setting. Talk to your pediatrician regarding the use of this medicine in children. Special care may be needed. Overdosage: If you think you have taken too much of this medicine contact a poison control center or emergency room at once. NOTE: This medicine is only for you. Do not share this medicine with others. What if I miss a dose? It is important not to miss your dose. Call your doctor or health care professional if you are unable to keep an appointment. What may interact with this medicine? -certain antibiotics given by injection -NSAIDs, medicines for pain and inflammation, like ibuprofen or naproxen -some diuretics like bumetanide, furosemide -teriparatide -thalidomide This list may not describe all possible interactions. Give your health care provider a list of all the medicines, herbs, non-prescription drugs, or dietary supplements you use. Also tell them if you smoke, drink alcohol, or use illegal drugs. Some items may interact with your medicine. What should I watch for while using this medicine? Visit your doctor or health care professional for regular checkups. It may be some time before you see the benefit from this medicine. Do not stop taking your medicine unless your doctor tells you to. Your doctor may order blood tests or other tests to see how you are doing. Women should inform their doctor if they wish to become pregnant or think they might be pregnant. There is a potential for serious side effects to an unborn child.  Talk to your health care professional or pharmacist for more information. You should make sure that you get enough calcium and vitamin D while you are taking this medicine. Discuss the foods you eat and the vitamins you take  with your health care professional. Some people who take this medicine have severe bone, joint, and/or muscle pain. This medicine may also increase your risk for jaw problems or a broken thigh bone. Tell your doctor right away if you have severe pain in your jaw, bones, joints, or muscles. Tell your doctor if you have any pain that does not go away or that gets worse. Tell your dentist and dental surgeon that you are taking this medicine. You should not have major dental surgery while on this medicine. See your dentist to have a dental exam and fix any dental problems before starting this medicine. Take good care of your teeth while on this medicine. Make sure you see your dentist for regular follow-up appointments. What side effects may I notice from receiving this medicine? Side effects that you should report to your doctor or health care professional as soon as possible: -allergic reactions like skin rash, itching or hives, swelling of the face, lips, or tongue -anxiety, confusion, or depression -breathing problems -changes in vision -eye pain -feeling faint or lightheaded, falls -jaw pain, especially after dental work -mouth sores -muscle cramps, stiffness, or weakness -redness, blistering, peeling or loosening of the skin, including inside the mouth -trouble passing urine or change in the amount of urine Side effects that usually do not require medical attention (report to your doctor or health care professional if they continue or are bothersome): -bone, joint, or muscle pain -constipation -diarrhea -fever -hair loss -irritation at site where injected -loss of appetite -nausea, vomiting -stomach upset -trouble sleeping -trouble swallowing -weak or tired This list may not describe all possible side effects. Call your doctor for medical advice about side effects. You may report side effects to FDA at 1-800-FDA-1088. Where should I keep my medicine? This drug is given in a hospital  or clinic and will not be stored at home. NOTE: This sheet is a summary. It may not cover all possible information. If you have questions about this medicine, talk to your doctor, pharmacist, or health care provider.  2019 Elsevier/Gold Standard (2014-03-01 14:19:39)  Influenza (Flu) Vaccine (Inactivated or Recombinant): What You Need to Know 1. Why get vaccinated? Influenza vaccine can prevent influenza (flu). Flu is a contagious disease that spreads around the [place] every year, usually between October and May. Anyone can get the flu, but it is more dangerous for some people. Infants and young children, people 71 years of age and older, pregnant women, and people with certain health conditions or a weakened immune system are at greatest risk of flu complications. Pneumonia, bronchitis, sinus infections and ear infections are examples of flu-related complications. If you have a medical condition, such as heart disease, cancer or diabetes, flu can make it worse. Flu can cause fever and chills, sore throat, muscle aches, fatigue, cough, headache, and runny or stuffy nose. Some people may have vomiting and diarrhea, though this is more common in children than adults. Each year thousands of people in the [place] die from flu, and many more are hospitalized. Flu vaccine prevents millions of illnesses and flu-related visits to the doctor each year. 2. Influenza vaccine CDC recommends everyone 34 months of age and older get vaccinated every flu season. Children 6 months through 8 years of  age may need 2 doses during a single flu season. Everyone else needs only 1 dose each flu season. It takes about 2 weeks for protection to develop after vaccination. There are many flu viruses, and they are always changing. Each year a new flu vaccine is made to protect against three or four viruses that are likely to cause disease in the upcoming flu season. Even when the vaccine doesn't exactly match these  viruses, it may still provide some protection. Influenza vaccine does not cause flu. Influenza vaccine may be given at the same time as other vaccines. 3. Talk with your health care provider Tell your vaccine provider if the person getting the vaccine:  Has had an allergic reaction after a previous dose of influenza vaccine, or has any severe, life-threatening allergies.  Has ever had Guillain-Barr Syndrome (also called GBS). In some cases, your health care provider may decide to postpone influenza vaccination to a future visit. People with minor illnesses, such as a cold, may be vaccinated. People who are moderately or severely ill should usually wait until they recover before getting influenza vaccine. Your health care provider can give you more information. 4. Risks of a vaccine reaction  Soreness, redness, and swelling where shot is given, fever, muscle aches, and headache can happen after influenza vaccine.  There may be a very small increased risk of Guillain-Barr Syndrome (GBS) after inactivated influenza vaccine (the flu shot). Young children who get the flu shot along with pneumococcal vaccine (PCV13), and/or DTaP vaccine at the same time might be slightly more likely to have a seizure caused by fever. Tell your health care provider if a child who is getting flu vaccine has ever had a seizure. People sometimes faint after medical procedures, including vaccination. Tell your provider if you feel dizzy or have vision changes or ringing in the ears. As with any medicine, there is a very remote chance of a vaccine causing a severe allergic reaction, other serious injury, or death. 5. What if there is a serious problem? An allergic reaction could occur after the vaccinated person leaves the clinic. If you see signs of a severe allergic reaction (hives, swelling of the face and throat, difficulty breathing, a fast heartbeat, dizziness, or weakness), call 9-1-1 and get the person to the  nearest hospital. For other signs that concern you, call your health care provider. Adverse reactions should be reported to the Vaccine Adverse Event Reporting System (VAERS). Your health care provider will usually file this report, or you can do it yourself. Visit the VAERS website at www.vaers.SamedayNews.es or call 647-320-7942.VAERS is only for reporting reactions, and VAERS staff do not give medical advice. 6. The National Vaccine Injury Compensation Program The [employer] Vaccine Injury Compensation Program (VICP) is a federal program that was created to compensate people who may have been injured by certain vaccines. Visit the VICP website at GoldCloset.com.ee or call 519 478 1411 to learn about the program and about filing a claim. There is a time limit to file a claim for compensation. 7. How can I learn more?  Ask your healthcare provider.  Call your local or state health department.  Contact the Centers for Disease Control and Prevention (CDC): ? Call 614-613-4100 (1-800-CDC-INFO) or ? Visit CDC's https://gibson.com/ Vaccine Information Statement (Interim) Inactivated Influenza Vaccine (05/31/2018) This information is not intended to replace advice given to you by your health care provider. Make sure you discuss any questions you have with your health care provider. Document Released: 07/28/2006 Document Revised: 06/04/2018 Document  Reviewed: 06/04/2018 Elsevier Interactive Patient Education  [employer].

## 2018-11-29 ENCOUNTER — Telehealth: Payer: Self-pay | Admitting: Hematology

## 2018-11-29 ENCOUNTER — Ambulatory Visit: Payer: Medicare Other

## 2018-11-29 LAB — MULTIPLE MYELOMA PANEL, SERUM
ALBUMIN/GLOB SERPL: 0.7 (ref 0.7–1.7)
ALPHA 1: 0.3 g/dL (ref 0.0–0.4)
Albumin SerPl Elph-Mcnc: 3.4 g/dL (ref 2.9–4.4)
Alpha2 Glob SerPl Elph-Mcnc: 1 g/dL (ref 0.4–1.0)
B-Globulin SerPl Elph-Mcnc: 0.9 g/dL (ref 0.7–1.3)
Gamma Glob SerPl Elph-Mcnc: 2.9 g/dL — ABNORMAL HIGH (ref 0.4–1.8)
Globulin, Total: 5.1 g/dL — ABNORMAL HIGH (ref 2.2–3.9)
IgA: 24 mg/dL — ABNORMAL LOW (ref 61–437)
IgG (Immunoglobin G), Serum: 3912 mg/dL — ABNORMAL HIGH (ref 700–1600)
IgM (Immunoglobulin M), Srm: 9 mg/dL — ABNORMAL LOW (ref 20–172)
M Protein SerPl Elph-Mcnc: 2.8 g/dL — ABNORMAL HIGH
Total Protein ELP: 8.5 g/dL (ref 6.0–8.5)

## 2018-11-29 NOTE — Telephone Encounter (Signed)
Per 2/11 los Carfilzomib has been added.  Printed and mailed calendar.

## 2018-11-30 ENCOUNTER — Telehealth: Payer: Self-pay | Admitting: *Deleted

## 2018-11-30 NOTE — Telephone Encounter (Signed)
Faxed statement of medical necessity to [facility] for [employer] with Dr. Irene Limbo signature [phone].  Received fax confirmation.

## 2018-12-03 NOTE — Progress Notes (Signed)
FMLA for son, Yehuda Printup, successfully faxed to Lindon Romp at [place] at [phone]. Mailed copy to patient address on file.

## 2018-12-04 ENCOUNTER — Inpatient Hospital Stay: Payer: Medicare Other

## 2018-12-04 DIAGNOSIS — C9002 Multiple myeloma in relapse: Secondary | ICD-10-CM | POA: Diagnosis not present

## 2018-12-04 DIAGNOSIS — G893 Neoplasm related pain (acute) (chronic): Secondary | ICD-10-CM | POA: Diagnosis not present

## 2018-12-04 DIAGNOSIS — Z23 Encounter for immunization: Secondary | ICD-10-CM | POA: Diagnosis not present

## 2018-12-04 DIAGNOSIS — Z5112 Encounter for antineoplastic immunotherapy: Secondary | ICD-10-CM | POA: Diagnosis not present

## 2018-12-04 DIAGNOSIS — Z79899 Other long term (current) drug therapy: Secondary | ICD-10-CM | POA: Diagnosis not present

## 2018-12-04 LAB — CMP (CANCER CENTER ONLY)
ALT: 13 U/L (ref 0–44)
AST: 16 U/L (ref 15–41)
Albumin: 3.3 g/dL — ABNORMAL LOW (ref 3.5–5.0)
Alkaline Phosphatase: 131 U/L — ABNORMAL HIGH (ref 38–126)
Anion gap: 8 (ref 5–15)
BUN: 13 mg/dL (ref 8–23)
CO2: 26 mmol/L (ref 22–32)
Calcium: 9.3 mg/dL (ref 8.9–10.3)
Chloride: 100 mmol/L (ref 98–111)
Creatinine: 0.86 mg/dL (ref 0.61–1.24)
GFR, Estimated: >60 mL/min (ref >60)
Glucose, Bld: 138 mg/dL — ABNORMAL HIGH (ref 70–99)
Potassium: 3.7 mmol/L (ref 3.5–5.1)
Sodium: 134 mmol/L — ABNORMAL LOW (ref 135–145)
Total Bilirubin: 1 mg/dL (ref 0.3–1.2)
Total Protein: 8.7 g/dL — ABNORMAL HIGH (ref 6.5–8.1)

## 2018-12-04 LAB — CBC WITH DIFFERENTIAL/PLATELET
Abs Immature Granulocytes: 0.03 10*3/uL (ref 0.00–0.07)
Basophils Absolute: 0 10*3/uL (ref 0.0–0.1)
Basophils Relative: 0 %
Eosinophils Absolute: 0.1 10*3/uL (ref 0.0–0.5)
Eosinophils Relative: 2 %
HCT: 32 % — ABNORMAL LOW (ref 39.0–52.0)
Hemoglobin: 11 g/dL — ABNORMAL LOW (ref 13.0–17.0)
Immature Granulocytes: 1 %
Lymphocytes Relative: 10 %
Lymphs Abs: 0.4 10*3/uL — ABNORMAL LOW (ref 0.7–4.0)
MCH: 32.6 pg (ref 26.0–34.0)
MCHC: 34.4 g/dL (ref 30.0–36.0)
MCV: 95 fL (ref 80.0–100.0)
Monocytes Absolute: 0.5 10*3/uL (ref 0.1–1.0)
Monocytes Relative: 12 %
NEUTROS ABS: 3 10*3/uL (ref 1.7–7.7)
NEUTROS PCT: 75 %
Platelets: 228 10*3/uL (ref 150–400)
RBC: 3.37 MIL/uL — ABNORMAL LOW (ref 4.22–5.81)
RDW: 15.3 % (ref 11.5–15.5)
WBC: 4 10*3/uL (ref 4.0–10.5)
nRBC: 0 % (ref 0.0–0.2)

## 2018-12-05 ENCOUNTER — Inpatient Hospital Stay: Payer: Medicare Other | Admitting: Nutrition

## 2018-12-05 ENCOUNTER — Encounter: Payer: Medicare Other | Admitting: Nutrition

## 2018-12-05 ENCOUNTER — Other Ambulatory Visit: Payer: Medicare Other

## 2018-12-05 ENCOUNTER — Inpatient Hospital Stay: Payer: Medicare Other

## 2018-12-05 ENCOUNTER — Encounter: Payer: Self-pay | Admitting: Dietician

## 2018-12-05 VITALS — BP 116/64 | HR 84 | Temp 98.6°F | Resp 18

## 2018-12-05 DIAGNOSIS — C9000 Multiple myeloma not having achieved remission: Secondary | ICD-10-CM

## 2018-12-05 DIAGNOSIS — G893 Neoplasm related pain (acute) (chronic): Secondary | ICD-10-CM | POA: Diagnosis not present

## 2018-12-05 DIAGNOSIS — Z5112 Encounter for antineoplastic immunotherapy: Secondary | ICD-10-CM | POA: Diagnosis not present

## 2018-12-05 DIAGNOSIS — C9002 Multiple myeloma in relapse: Secondary | ICD-10-CM | POA: Diagnosis not present

## 2018-12-05 DIAGNOSIS — Z7189 Other specified counseling: Secondary | ICD-10-CM

## 2018-12-05 DIAGNOSIS — Z23 Encounter for immunization: Secondary | ICD-10-CM | POA: Diagnosis not present

## 2018-12-05 DIAGNOSIS — Z79899 Other long term (current) drug therapy: Secondary | ICD-10-CM | POA: Diagnosis not present

## 2018-12-05 MED ORDER — FAMOTIDINE IN NACL 20-0.9 MG/50ML-% IV SOLN
INTRAVENOUS | Status: AC
  Filled 2018-12-05: qty 50

## 2018-12-05 MED ORDER — MONTELUKAST SODIUM 10 MG PO TABS
10.0000 mg | ORAL_TABLET | Freq: Once | ORAL | Status: AC
  Administered 2018-12-05: 10 mg via ORAL

## 2018-12-05 MED ORDER — FAMOTIDINE IN NACL 20-0.9 MG/50ML-% IV SOLN
20.0000 mg | Freq: Once | INTRAVENOUS | Status: AC
  Administered 2018-12-05: 20 mg via INTRAVENOUS

## 2018-12-05 MED ORDER — DEXTROSE 5 % IV SOLN
20.0000 mg/m2 | Freq: Once | INTRAVENOUS | Status: AC
  Administered 2018-12-05: 48 mg via INTRAVENOUS
  Filled 2018-12-05: qty 15

## 2018-12-05 MED ORDER — SODIUM CHLORIDE 0.9 % IV SOLN
Freq: Once | INTRAVENOUS | Status: AC
  Administered 2018-12-05: 08:00:00 via INTRAVENOUS
  Filled 2018-12-05: qty 250

## 2018-12-05 MED ORDER — METHYLPREDNISOLONE SODIUM SUCC 125 MG IJ SOLR
INTRAMUSCULAR | Status: AC
  Filled 2018-12-05: qty 2

## 2018-12-05 MED ORDER — SODIUM CHLORIDE 0.9 % IV SOLN
INTRAVENOUS | Status: DC
  Administered 2018-12-05: 10:00:00 via INTRAVENOUS
  Filled 2018-12-05 (×2): qty 250

## 2018-12-05 MED ORDER — METHYLPREDNISOLONE SODIUM SUCC 125 MG IJ SOLR
100.0000 mg | Freq: Once | INTRAMUSCULAR | Status: AC
  Administered 2018-12-05: 100 mg via INTRAVENOUS

## 2018-12-05 MED ORDER — DIPHENHYDRAMINE HCL 25 MG PO CAPS
50.0000 mg | ORAL_CAPSULE | Freq: Once | ORAL | Status: AC
  Administered 2018-12-05: 50 mg via ORAL

## 2018-12-05 MED ORDER — ACETAMINOPHEN 325 MG PO TABS
ORAL_TABLET | ORAL | Status: AC
  Filled 2018-12-05: qty 2

## 2018-12-05 MED ORDER — MONTELUKAST SODIUM 10 MG PO TABS
ORAL_TABLET | ORAL | Status: AC
  Filled 2018-12-05: qty 1

## 2018-12-05 MED ORDER — ACETAMINOPHEN 325 MG PO TABS
650.0000 mg | ORAL_TABLET | Freq: Once | ORAL | Status: AC
  Administered 2018-12-05: 650 mg via ORAL

## 2018-12-05 MED ORDER — SODIUM CHLORIDE 0.9 % IV SOLN
15.6000 mg/kg | Freq: Once | INTRAVENOUS | Status: AC
  Administered 2018-12-05: 1700 mg via INTRAVENOUS
  Filled 2018-12-05: qty 80

## 2018-12-05 MED ORDER — DIPHENHYDRAMINE HCL 25 MG PO CAPS
ORAL_CAPSULE | ORAL | Status: AC
  Filled 2018-12-05: qty 2

## 2018-12-05 NOTE — Patient Instructions (Signed)
[facility] Discharge Instructions for Patients Receiving Chemotherapy  Today you received the following chemotherapy agents: Daratumumab (Darzalex), Carfilomib (Kyprolis)  To help prevent nausea and vomiting after your treatment, we encourage you to take your nausea medication as directed.    If you develop nausea and vomiting that is not controlled by your nausea medication, call the clinic.   BELOW ARE SYMPTOMS THAT SHOULD BE REPORTED IMMEDIATELY:  *FEVER GREATER THAN 100.5 F  *CHILLS WITH OR WITHOUT FEVER  NAUSEA AND VOMITING THAT IS NOT CONTROLLED WITH YOUR NAUSEA MEDICATION  *UNUSUAL SHORTNESS OF BREATH  *UNUSUAL BRUISING OR BLEEDING  TENDERNESS IN MOUTH AND THROAT WITH OR WITHOUT PRESENCE OF ULCERS  *URINARY PROBLEMS  *BOWEL PROBLEMS  UNUSUAL RASH Items with * indicate a potential emergency and should be followed up as soon as possible.  Feel free to call the clinic should you have any questions or concerns. The clinic phone number is [phone].  Please show the [place] at check-in to the Emergency Department and triage nurse.  Carfilzomib injection What is this medicine? CARFILZOMIB (kar FILZ oh mib) targets a specific protein within cancer cells and stops the cancer cells from growing. It is used to treat multiple myeloma. This medicine may be used for other purposes; ask your health care provider or pharmacist if you have questions. COMMON BRAND NAME(S): KYPROLIS What should I tell my health care provider before I take this medicine? They need to know if you have any of these conditions: -heart disease -history of blood clots -irregular heartbeat -kidney disease -liver disease -lung or breathing disease -an unusual or allergic reaction to carfilzomib, or other medicines, foods, dyes, or preservatives -pregnant or trying to get pregnant -breast-feeding How should I use this medicine? This medicine is for injection or infusion  into a vein. It is given by a health care professional in a hospital or clinic setting. Talk to your pediatrician regarding the use of this medicine in children. Special care may be needed. Overdosage: If you think you have taken too much of this medicine contact a poison control center or emergency room at once. NOTE: This medicine is only for you. Do not share this medicine with others. What if I miss a dose? It is important not to miss your dose. Call your doctor or health care professional if you are unable to keep an appointment. What may interact with this medicine? Interactions are not expected. Give your health care provider a list of all the medicines, herbs, non-prescription drugs, or dietary supplements you use. Also tell them if you smoke, drink alcohol, or use illegal drugs. Some items may interact with your medicine. This list may not describe all possible interactions. Give your health care provider a list of all the medicines, herbs, non-prescription drugs, or dietary supplements you use. Also tell them if you smoke, drink alcohol, or use illegal drugs. Some items may interact with your medicine. What should I watch for while using this medicine? Your condition will be monitored carefully while you are receiving this medicine. Report any side effects. Continue your course of treatment even though you feel ill unless your doctor tells you to stop. You may need blood work done while you are taking this medicine. Do not become pregnant while taking this medicine or for at least 6 months after stopping it. Women should inform their doctor if they wish to become pregnant or think they might be pregnant. There is a potential for serious side effects  to an unborn child. Men should not father a child while taking this medicine and for at least 3 months after stopping it. Talk to your health care professional or pharmacist for more information. Do not breast-feed an infant while taking this  medicine or for 2 weeks after the last dose. Check with your doctor or health care professional if you get an attack of severe diarrhea, nausea and vomiting, or if you sweat a lot. The loss of too much body fluid can make it dangerous for you to take this medicine. You may get dizzy. Do not drive, use machinery, or do anything that needs mental alertness until you know how this medicine affects you. Do not stand or sit up quickly, especially if you are an older patient. This reduces the risk of dizzy or fainting spells. What side effects may I notice from receiving this medicine? Side effects that you should report to your doctor or health care professional as soon as possible: -allergic reactions like skin rash, itching or hives, swelling of the face, lips, or tongue -confusion -dizziness -feeling faint or lightheaded -fever or chills -palpitations -seizures -signs and symptoms of bleeding such as bloody or black, tarry stools; red or dark-brown urine; spitting up blood or brown material that looks like coffee grounds; red spots on the skin; unusual bruising or bleeding including from the eye, gums, or nose -signs and symptoms of a blood clot such as breathing problems; changes in vision; chest pain; severe, sudden headache; pain, swelling, warmth in the leg; trouble speaking; sudden numbness or weakness of the face, arm or leg -signs and symptoms of kidney injury like trouble passing urine or change in the amount of urine -signs and symptoms of liver injury like dark yellow or brown urine; general ill feeling or flu-like symptoms; light-colored stools; loss of appetite; nausea; right upper belly pain; unusually weak or tired; yellowing of the eyes or skin Side effects that usually do not require medical attention (report to your doctor or health care professional if they continue or are bothersome): -back pain -cough -diarrhea -headache -muscle cramps -vomiting This list may not describe all  possible side effects. Call your doctor for medical advice about side effects. You may report side effects to FDA at 1-800-FDA-1088. Where should I keep my medicine? This drug is given in a hospital or clinic and will not be stored at home. NOTE: This sheet is a summary. It may not cover all possible information. If you have questions about this medicine, talk to your doctor, pharmacist, or health care provider.  2019 Elsevier/Gold Standard (2017-07-19 14:07:13)

## 2018-12-05 NOTE — Progress Notes (Signed)
error 

## 2018-12-05 NOTE — Progress Notes (Signed)
Nutrition Assessment   Reason for Assessment:  Referred by staff   ASSESSMENT:  68 year old male with multiple myeloma receiving infusion today. Patient seen in August by RD and reports intentional wt loss. Patient reports 225lb, last wt taken on 2/5 in clinic 228lbs. Patient reports continued intentional wt loss with goal wt of 200lbs. RD encouraged patient to maintain current wt and discussed the importance of calories and protein during treatment.   Patient recalls oatmeal muffins or fiber bar for breakfast, assorted sandwiches with an apple for lunch, and chili and quiche for dinner. Patient currently drinking  1-2 Atkins shakes daily. RD encouraged patient to d/c Atkins and incorporate Boost/Ensure, provided samples and coupons.   Patient reports 2L H2O daily and reports occasional constipation and feelings of tightness in his stomach. Patient takes Murelax daily and stated if that does not promote movement he drinks prune juice.      Nutrition Focused Physical Exam:    Medications:  MVI   Labs:  2/18: Na 134 (L)   Anthropometrics:   Height: 6'1" (1.876m Weight: 103.6 kg (228.8 lbs) UBW: 230 lbs BMI: 30.15   Estimated Energy Needs  Kcals: 2500-3000 Protein: 145-165 grams Fluid: 2.5L/day   NUTRITION DIAGNOSIS: Increased nutrient needs related to cancer and cancer related treatments as evidenced by estimated calorie and protein needs   MALNUTRITION DIAGNOSIS:    INTERVENTION:  Ensure Enlive po BID, each supplement provides 350 kcal and 20 grams of protein   MONITORING, EVALUATION, GOAL:  Labs, wt trends   Next Visit:  12/12/18 during infusion  SLajuan Lines RD, LDN  After Hours/Weekend Pager: [phone]

## 2018-12-06 ENCOUNTER — Ambulatory Visit: Payer: Medicare Other | Attending: Hematology | Admitting: Physical Therapy

## 2018-12-06 ENCOUNTER — Encounter: Payer: Self-pay | Admitting: Physical Therapy

## 2018-12-06 ENCOUNTER — Other Ambulatory Visit: Payer: Self-pay

## 2018-12-06 DIAGNOSIS — M546 Pain in thoracic spine: Secondary | ICD-10-CM | POA: Insufficient documentation

## 2018-12-06 DIAGNOSIS — M25512 Pain in left shoulder: Secondary | ICD-10-CM | POA: Insufficient documentation

## 2018-12-06 DIAGNOSIS — R29898 Other symptoms and signs involving the musculoskeletal system: Secondary | ICD-10-CM | POA: Diagnosis not present

## 2018-12-06 DIAGNOSIS — M6281 Muscle weakness (generalized): Secondary | ICD-10-CM | POA: Insufficient documentation

## 2018-12-06 DIAGNOSIS — M25511 Pain in right shoulder: Secondary | ICD-10-CM | POA: Diagnosis not present

## 2018-12-06 NOTE — Therapy (Signed)
[place], [place], [address] Phone: [phone]   Fax:  [phone]  Physical Therapy Evaluation  Patient Details  Name: Michael [name] MRN: [id number] Date of Birth: 1951-08-16 Referring Provider (PT): Dr. Irene Limbo    Encounter Date: 12/06/2018  PT End of Session - 12/06/18 1652    Visit Number  1    Number of Visits  9    Date for PT Re-Evaluation  01/04/19    PT Start Time  6767    PT Stop Time  1600    PT Time Calculation (min)  45 min    Activity Tolerance  Patient limited by pain    Behavior During Therapy  [facility] for tasks assessed/performed       Past Medical History:  Diagnosis Date  . Angioedema   . CPAP (continuous positive airway pressure) dependence    uses at night  . Gastroesophageal reflux disease without esophagitis   . History of radiation therapy 06/04/18- 06/15/18   25 Gy in 10 fractions to the laryngeal/ cartilage lesion  . Hyperlipidemia   . Multiple myeloma ([place]) 2012   Treated at [facility]  . Non morbid obesity due to excess calories   . Prediabetes   . Prediabetes   . Unspecified glaucoma(365.9)   . Urticaria, unspecified     Past Surgical History:  Procedure Laterality Date  . IR RADIOLOGIST EVAL & MGMT  11/13/2018  . PORTACATH PLACEMENT    . PORTACATH PLACEMENT     removed 02/2016    There were no vitals filed for this visit.   Subjective Assessment - 12/06/18 1524    Subjective  He is having pain in his lower back and in the chest, and left shoulder. ( bursitis per MD pt states he hasn't been doing the exercises .   He has lesion at T12 ( fracture) but is not a candidate for vertibroplasty at this time. He probably has more fractures He says he is now dealing with pain management.  He is getting infusions every week ( no port)     Pertinent History  diagnosed in April 2012 in [place]. and was in a clinical trial at [place] and had functional independence at that time. In  2016, he retired from [employer] and moved to [facility] and under the care of Dr. Irene Limbo.  He again began treatment for multiple myeloma. He joined another clinical trial in Nov. and switched back to [facility] for treatment, But the cancer had progressed  by Jan 2020 possibly from clinical trial drug.  That was put on hold and he is back under care of Dr. Irene Limbo.  Before Dec. he had pretty good funcitoning     Patient Stated Goals  Pt wants to know what he is allowed to do and what he is not allowed to do in relation to the cancer?    Currently in Pain?  Yes    Pain Score  3     Pain Location  Shoulder    Pain Orientation  Right;Left    Pain Descriptors / Indicators  Aching    Pain Radiating Towards  also has pain in back     Pain Onset  More than a month ago    Pain Frequency  Constant         [facility] PT Assessment - 12/06/18 0001      Assessment   Medical Diagnosis  multiple myeloma     Referring Provider (PT)  Dr. Irene Limbo     Onset Date/Surgical Date  01/16/11    Hand Dominance  Right      Precautions   Precautions  Fall      Restrictions   Weight Bearing Restrictions  No      Balance Screen   Has the patient fallen in the past 6 months  No    Has the patient had a decrease in activity level because of a fear of falling?   No    Is the patient reluctant to leave their home because of a fear of falling?   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 , used to Psychologist, occupational at the [employer]    Overall Cognitive Status  Within Functional Limits for tasks assessed      Observation/Other Assessments   Observations  Pt appears to be more uncomfortable in sitting than standing       Coordination   Gross Motor Movements are Fluid and Coordinated  No      Posture/Postural Control   Posture/Postural Control  Postural limitations    Postural Limitations  Rounded Shoulders;Forward head;Increased thoracic kyphosis    Posture Comments  anterior abdomen      ROM / Strength   AROM / PROM / Strength  AROM;Strength      AROM   Overall AROM   Deficits    Right/Left Shoulder  Right;Left    Right Shoulder Flexion  70 Degrees    Right Shoulder ABduction  40 Degrees    Left Shoulder Flexion  132 Degrees    Left Shoulder ABduction  110 Degrees      Strength   Overall Strength Comments  unable to assess joint strength isometrically due to pain     Strength Assessment Site  Hand    Right/Left hand  Right;Left    Right Hand Grip (lbs)  80/70/80   age matched norm is 91.1   Left Hand Grip (lbs)  75/80/70   age matched norm is 26.9     Transfers   Transfers  Sit to Stand;Stand to [employer] to Stand  6: Modified independent (Device/Increase time)    Comments  Pt has a very difficult time with sit to stand due to back pain,  He slides out to the edge of the chair, reaches back with left arm to push up on armrest and pushes down on cane in front of him to push up on legs.       Ambulation/Gait   Ambulation/Gait  Yes    Ambulation/Gait Assistance  6: Modified independent (Device/Increase time)    Assistive device  Straight cane   with rubber 4 pronged foot      Standardized Balance Assessment   Standardized Balance Assessment  Timed  Up and Go Test      Timed Up and Go Test   TUG  Normal TUG    Normal TUG (seconds)  15.08   pt carried cane, but did not put it on the ground                Objective measurements completed on examination: See above findings.               PT Education - 12/06/18 1651    Education Details  Educted pt to keep back extended and to avoid spinal flexion as that will make the compression fracture pain worse     Person(s) Educated  Patient    Methods  Explanation    Comprehension  Verbalized understanding          PT Long Term Goals - 12/06/18 1707      PT LONG TERM GOAL #1   Title  Pt will have painfree elevation of both shoulders to 115 degress so that he can perform his activities of daily life easeier     Time  4    Period  Weeks    Status  New      PT LONG TERM GOAL #2   Title  Pt will have a HEP for isometrics and trunk extension exercises so that he can maintain and improve strength at home     Time  4    Period  Weeks    Status  New      PT LONG TERM GOAL #3   Title  Pt will decrease his Normal TUG time to < 12 seconds indicating a functional balance and mobility improvment     Baseline  15.08    Time  4    Period  Weeks    Status  New             Plan - 12/06/18 1653    Clinical Impression Statement  68 yo male with multiple myeloma who presents with decreased mobility due to severy pain.  He has a compression fracture of T12 and acute bursitis in both shoulders.  He receives and infusion every week.  Despite great difficutly in getting from sit to stand due to pain, he has good grip strength indicating overall good body functional strength.  Isometric testing of individual muscles limited by pain today.  He has limited range of motion especailly in right shoulder due to pain today.  He is considering going back to Dr. Percell Miller to get a shot in the right shoulder as his left shoulder seems to be doing better since he got a shot in that one. He will benefit from PT for pain managment including trial of TENS unit, education in spinal extension and joint isometric exercises as well as general strengthening as tolerated     History and Personal Factors relevant to plan of care:  History  of multiple myeloma with mulitple bone fractures, severe pain, wife with physical limitations also.     Clinical Presentation  Evolving    Clinical Presentation due to:  ongoing chemotherapy     Clinical Decision Making  Moderate    Rehab Potential  Good    Clinical Impairments Affecting Rehab Potential  acute compression fracture of T12 , recent cortisone shot in left shoulder     PT Frequency  2x / week    PT Duration  4 weeks    PT Treatment/Interventions  ADLs/Self Care Home Management;Moist Heat;Iontophoresis 3m/ml Dexamethasone;DME Instruction;Stair training;Functional  mobility training;Neuromuscular re-education;Therapeutic exercise;Therapeutic activities;Patient/family education   consider ionto to right shoulder if pt does not get a cortisone shot in right shoulder   PT Next Visit Plan  instruct in precautions for compression fracture: avoid spinal flexion and rotation and assess bed mobility within these parameters and set a goal if needed, Instruct in HEP for scapular retraction, low rows, wall taps and glute sets gentle shoulder ROM and progression to AROM as able withing pain limits, try ionto to right shoulder if cert signed and pt has not gotten a shot in it  consider use of TENS    Consulted and Agree with Plan of Care  Patient       Patient will benefit from skilled therapeutic intervention in order to improve the following deficits and impairments:  Abnormal gait, Pain, Postural dysfunction, Decreased mobility, Decreased activity tolerance, Decreased endurance, Decreased range of motion, Decreased strength, Impaired perceived functional ability, Impaired UE functional use, Obesity, Difficulty walking, Decreased balance, Decreased knowledge of precautions  Visit Diagnosis: Pain in thoracic spine - Plan: PT plan of care cert/re-cert, PT plan of care cert/re-cert  Acute pain of right shoulder - Plan: PT plan of care cert/re-cert, PT plan of care cert/re-cert  Acute pain of left  shoulder - Plan: PT plan of care cert/re-cert, PT plan of care cert/re-cert  Muscle weakness (generalized) - Plan: PT plan of care cert/re-cert, PT plan of care cert/re-cert  Other symptoms and signs involving the musculoskeletal system - Plan: PT plan of care cert/re-cert, PT plan of care cert/re-cert     Problem List Patient Active Problem List   Diagnosis Date Noted  . Counseling regarding advance care planning and goals of care 10/28/2018  . Multiple myeloma ([place]) 03/28/2017   Donato Heinz. Owens Shark PT  Norwood Levo 12/06/2018, 5:25 PM  [place], [place], [address] Phone: [phone]   Fax:  [phone]  Name: Michael [name] MRN: [id number] Date of Birth: [birth year]

## 2018-12-10 NOTE — Progress Notes (Signed)
[name]    HEMATOLOGY/ONCOLOGY CLINIC NOTE  Date of Service: 12/11/18     Patient Care Team: [name], MD as PCP - General (Family Medicine) [name], MD as Consulting Physician (Hematology) [name], MD as Attending Physician (Radiation Oncology) [name], RN as Oncology Nurse Navigator (Oncology) [name] MD ([facility] _ primary oncology) phone number [phone] Email- [email or web address]  CHIEF COMPLAINTS  followup of multiple myeloma.  HISTORY OF PRESENTING ILLNESS:   Michael [name] is a wonderful 68 y.o. male , retired [employer] who has been referred to [place] by Dr .Sabra Heck, [name], MD for establishing local oncology care "In case needed for treatment/management of complications pertaining to myeloma"  Patient has a history of multiple myeloma and is currently being followed actively managed at [facility] by Dr.[name] MD who is his primary oncologist. Patient is currently on a study protocol and is being monitored for myeloma recurrence.  Based on available records being put together  -Patient was diagnosed with IgG kappa ISS stage I multiple myeloma with hyperdiploid complex cytogenetics in 2012. Bone marrow biopsy apparently showed 50% kappa restricted plasma cells with an initial M spike of 3.4 g/dL with evidence of lytic lesions on his PET/CT scan including right rib fracture and lesions of the lumbar and cervical spine. -Patient was treated under protocol 11-C-0221 with from 02/21/2013 with  Carfilzomib/Revlimid/Dexamethasone for a total of 8 cycles. He reports that his stem cells were collected and stored after 4 cycles -Posttreatment bone marrow biopsy showed less than 5% plasma cells with a negative flow cytometry and improvement in his previously PET avid lesions. Some concern for new focus of activity at C2 lytic lesion at L4 and several small lesions throughout the vertebrae. -Patient was on maintenance lenalidomide 10 mg by  mouth daily for 2 years or more until end of 2014. 08/19/2013 - bone marrow biopsy showed normocellular marrow with less than 5% plasma cells. PET CT scan showed decreased focal metabolic ligament prior rib lesions and no new lesions. Flow cytometry was negative for residual disease. Serum and urine electrophoresis and IFE showed no evidence of monoclonal protein. -Patient notes that he was off protocol for a few years due to lack of clinical trial research funding. -Patient notes that he is back on a follow-up protocol and continues to follow and receive his primary treatment at [facility] at this time. -Patient reports he had his last PET CT scan blood and urine tests and bone marrow examination in February 2018. The results of which are not available to [place] currently.  He notes that there was concern for a very slowly growing sternal FDG avid lesion that has been present for years. He has a follow-up at [facility] next month to determine the management of this lesion. He reports that was some consideration of considering radiation. he notes that this lesion has not been biopsied. No other focal bone pains at this time.  We discussed in detail about what our role will be and noted that we shall be available for any acute issues that need to be taken care of locally but that at this time the primary treatment and evaluation will be driven by his team at [facility] as per their research protocols and input. The patient and his wife are clearly aware of this and are in agreement with this plan.  INTERVAL HISTORY  [name] comes is here for continued management of his Multiple Myeloma on C2D8 treatment. The patient's last visit with [place] was  on 11/27/18. The pt reports that he is doing well overall.  The pt reports that he will begin outpatient PT later today. He notes that he has tolerated his first infusion of Carfilzomib last week.   The pt notes that he is moving his bowels and has used prune juice  and miralax. The pt notes that the fentanyl patch is helping control his pain, and has not taken any Oxycodone in the last 2 days. He endorses left sided pain in the small of his back, which he feels is related to his bed which he notes is "very bad," and is going to look into getting a new bed.  The pt has continued follow up with his orthopedist Dr. Percell Miller for care of his bursitis in his shoulders and has obtained local steroid injections.  Lab results today (12/11/18) of CBC w/diff and CMP is as follows: all values are WNL except for RBC at 3.49, HGB at 11.2, HCT at 33.7, Lymphs abs at 400, Glucose at 140, Total Protein at 8.3, Albumin at 3.3, AST at 14, Alk Phos at 141.  On review of systems, pt reports moving his bowels, stable energy levels, bursitis pain, left lower back pain, and denies new bone pains, leg swelling, and any other symptoms.  MEDICAL HISTORY:  Past Medical History:  Diagnosis Date  . Angioedema   . CPAP (continuous positive airway pressure) dependence    uses at night  . Gastroesophageal reflux disease without esophagitis   . History of radiation therapy 06/04/18- 06/15/18   25 Gy in 10 fractions to the laryngeal/ cartilage lesion  . Hyperlipidemia   . Multiple myeloma ([place]) 2012   Treated at [facility]  . Non morbid obesity due to excess calories   . Prediabetes   . Prediabetes   . Unspecified glaucoma(365.9)   . Urticaria, unspecified   Diabetes mellitus Glaucoma Left toes neuropathy  GERD  SURGICAL HISTORY: Past Surgical History:  Procedure Laterality Date  . IR RADIOLOGIST EVAL & MGMT  11/13/2018  . PORTACATH PLACEMENT    . PORTACATH PLACEMENT     removed 02/2016    SOCIAL HISTORY: Social History   Socioeconomic History  . Marital status: Married    Spouse name: Not on file  . Number of children: 2  . Years of education: Not on file  . Highest education level: Not on file  Occupational History  . Occupation: Retired Chief Strategy Officer for [employer]  . Financial resource strain: Not on file  . Food insecurity:    Worry: Not on file    Inability: Not on file  . Transportation needs:    Medical: Not on file    Non-medical: Not on file  Tobacco Use  . Smoking status: Never Smoker  . Smokeless tobacco: Never Used  Substance and Sexual Activity  . Alcohol use: Yes    Alcohol/week: 0.0 standard drinks    Comment: Occasional beer  . Drug use: No  . Sexual activity: Not on file  Lifestyle  . Physical activity:    Days per week: Not on file    Minutes per session: Not on file  . Stress: Not on file  Relationships  . Social connections:    Talks on phone: Not on file    Gets together: Not on file    Attends religious service: Not on file    Active member of club or organization: Not on file    Attends meetings of clubs or organizations: Not  on file    Relationship status: Not on file  . Intimate partner violence:    Fear of current or ex partner: No    Emotionally abused: No    Physically abused: No    Forced sexual activity: No  Other Topics Concern  . Not on file  Social History Narrative   Unable to [employer] partner violence- partner in room   Never smoker Married Retired from the [employer] in June 2017 and moved to [facility].  FAMILY HISTORY: Family History  Problem Relation Age of Onset  . Cancer Father     ALLERGIES:  has No Known Allergies.  MEDICATIONS:  Current Outpatient Medications  Medication Sig Dispense Refill  . acyclovir (ZOVIRAX) 400 MG tablet Take 1 tablet (400 mg total) by mouth 2 (two) times daily. 60 tablet 11  . aspirin 81 MG tablet Take 81 mg by mouth daily.    [name] atorvastatin (LIPITOR) 20 MG tablet Take 20 mg by mouth every evening.     [name] dexamethasone (DECADRON) 4 MG tablet 67m (5 tabs) with breakfast the day after each daratumumab treatment 20 tablet 4  . fentaNYL (DURAGESIC) 25 MCG/HR Place 1 patch onto the skin every 3 (three) days. 10 patch 0  .  Lido-Capsaicin-Men-Methyl Sal (MEDI-PATCH-LIDOCAINE) 0.5-0.035-5-20 % PTCH Apply 1 patch topically 2 (two) times daily as needed. 30 patch 0  . Multiple Vitamin (MULTIVITAMIN) tablet Take 1 tablet by mouth daily.    .[name] Kitchennystatin (MYCOSTATIN/NYSTOP) powder Apply 100,000 g topically daily as needed.    . ondansetron (ZOFRAN) 8 MG tablet Take 1 tablet (8 mg total) by mouth 2 (two) times daily as needed (Nausea or vomiting). 30 tablet 1  . oxyCODONE (OXY IR/ROXICODONE) 5 MG immediate release tablet Take 1-2 tablets (5-10 mg total) by mouth every 4 (four) hours as needed for severe pain or breakthrough pain. 60 tablet 0  . pantoprazole (PROTONIX) 40 MG tablet Take 40 mg by mouth daily.     . polyethylene glycol (MIRALAX) packet Take 17 g by mouth daily.    . prochlorperazine (COMPAZINE) 10 MG tablet Take 1 tablet (10 mg total) by mouth every 6 (six) hours as needed (Nausea or vomiting). 30 tablet 1  . senna-docusate (SENOKOT-S) 8.6-50 MG tablet Take 2 tablets by mouth at bedtime. Takes 50 mg 60 tablet 0   No current facility-administered medications for this visit.     REVIEW OF SYSTEMS:    A 10+ POINT REVIEW OF SYSTEMS WAS OBTAINED including neurology, dermatology, psychiatry, cardiac, respiratory, lymph, extremities, GI, GU, Musculoskeletal, constitutional, breasts, reproductive, HEENT.  All pertinent positives are noted in the HPI.  All others are negative.   PHYSICAL EXAMINATION: ECOG PERFORMANCE STATUS: 1 - Symptomatic but completely ambulatory  Vitals:   12/11/18 0925  BP: 137/77  Pulse: 87  Resp: 18  Temp: 97.9 F (36.6 C)  TempSrc: Oral  SpO2: 100%  Weight: 227 lb 4.8 oz (103.1 kg)  Height: 6' 1"  (1.854 m)   Body mass index is 29.99 kg/m.   GENERAL:alert, in no acute distress and comfortable SKIN: no acute rashes, no significant lesions EYES: conjunctiva are pink and non-injected, sclera anicteric OROPHARYNX: MMM, no exudates, no oropharyngeal erythema or ulceration NECK:  supple, no JVD LYMPH:  no palpable lymphadenopathy in the cervical, axillary or inguinal regions LUNGS: clear to auscultation b/l with normal respiratory effort HEART: regular rate & rhythm ABDOMEN:  normoactive bowel sounds , non tender, not distended. No palpable hepatosplenomegaly.  Extremity:  no pedal edema PSYCH: alert & oriented x 3 with fluent speech NEURO: no focal motor/sensory deficits   LABORATORY DATA:  I have reviewed the data as listed  . CBC Latest Ref Rng & Units 12/11/2018 12/04/2018 11/27/2018  WBC 4.0 - 10.5 K/uL 6.1 4.0 3.5(L)  Hemoglobin 13.0 - 17.0 g/dL 11.2(L) 11.0(L) 10.9(L)  Hematocrit 39.0 - 52.0 % 33.7(L) 32.0(L) 33.0(L)  Platelets 150 - 400 K/uL 224 228 214  ANC 1200  . CMP Latest Ref Rng & Units 12/11/2018 12/04/2018 11/27/2018  Glucose 70 - 99 mg/dL 140(H) 138(H) 120(H)  BUN 8 - 23 mg/dL 9 13 14   Creatinine 0.61 - 1.24 mg/dL 0.82 0.86 0.86  Sodium 135 - 145 mmol/L 136 134(L) 136  Potassium 3.5 - 5.1 mmol/L 3.9 3.7 4.0  Chloride 98 - 111 mmol/L 102 100 103  CO2 22 - 32 mmol/L 27 26 27   Calcium 8.9 - 10.3 mg/dL 9.3 9.3 9.3  Total Protein 6.5 - 8.1 g/dL 8.3(H) 8.7(H) 9.0(H)  Total Bilirubin 0.3 - 1.2 mg/dL 0.8 1.0 0.8  Alkaline Phos 38 - 126 U/L 141(H) 131(H) 116  AST 15 - 41 U/L 14(L) 16 19  ALT 0 - 44 U/L 11 13 12     05/08/18 Left Neck Thyroid Cartilage Biopsy:        RADIOGRAPHIC STUDIES: I have personally reviewed the radiological images as listed and agreed with the findings in the report. Nm Bone W/spect  Result Date: 11/23/2018 CLINICAL DATA:  Multiple myeloma in relapse. History of T12 compression fracture. EXAM: NM BONE SCAN AND SPECT IMAGING TECHNIQUE: After intravenous injection of radiopharmaceutical, delayed planar images were obtained in multiple projections. Additionally, delayed triplanar SPECT images were obtained through the area of interest. RADIOPHARMACEUTICALS:  21.0 mCi Tc-14mMDP COMPARISON:  PET-CT 05/22/2018. Lumbar spine  radiographs 10/15/2018. Images only from outside PET-CT 10/02/2018 and 10/18/2018. The reports from these outside studies are not provided. FINDINGS: This examination concentrates on the mid trunk, extending from the upper thoracic region into the mid pelvis. The extremities are not included. There is patchy uptake throughout the ribs and spine. Focally increased activity is noted in the region of the T12 pedicles bilaterally as well as the right L1 pedicle. There is no clear corresponding fracture on the comparison studies. Soft tissue activity appears unremarkable. IMPRESSION: Patchy uptake throughout the ribs and spine with areas of focally increased activity in both T12 pedicles and the right L1 pedicle. This focal activity could be stress related without clear corresponding fracture on available prior studies. Electronically Signed   By: WRichardean SaleM.D.   On: 11/23/2018 08:54   Ir Radiologist Eval & Mgmt  Result Date: 11/13/2018 Please refer to notes tab for details about interventional procedure. (Op Note)   ASSESSMENT & PLAN:   68y.o. caucasian male, retired [employer] with   1) Multiple Myeloma Patient was diagnosed with IgG kappa ISS stage I multiple myeloma with hyperdiploid complex cytogenetics in 2012. Bone marrow biopsy apparently showed 50% kappa restricted plasma cells with an initial M spike of 3.4 g/dL with evidence of lytic lesions on his PET/CT scan including right rib fracture and lesions of the lumbar and cervical spine. -Patient was treated under protocol 11-C-0221 with from 02/21/2013 with  Carfilzomib/Revlimid/Dexamethasone for a total of 8 cycles. He reports that his stem cells were collected and stored after 4 cycles -Posttreatment bone marrow biopsy showed less than 5% plasma cells with a negative flow cytometry and improvement in his previously PET avid  lesions. Some concern for new focus of activity at C2 lytic lesion at L4 and several small lesions throughout the  vertebrae. -Patient was on maintenance lenalidomide 10 mg by mouth daily for 2 years or more until end of 2014. 08/19/2013 - bone marrow biopsy showed normocellular marrow with less than 5% plasma cells. PET CT scan showed decreased focal metabolic ligament prior rib lesions and no new lesions. Flow cytometry was negative for residual disease. Serum and urine electrophoresis and IFE showed no evidence of monoclonal protein. -Patient notes that he was off protocol for a few years due to lack of clinical trial research funding. -Patient returned to follow-up protocol and began to follow and receive his primary treatment at [facility]  -Patient's labs show minimal IgG kappa protein on IFE and a CT chest in May 2018 that did not show any overt signs of myeloma progression in the bones.  Rpt Myeloma labs 03/2017 - M spike of 0.3g/dl with a repeat M spike stable at 0.3 g/dL on 05/23/2017 which remains stable on labs today 07/25/17 Serum kappa lambda free light chain ratio not significantly changed. PET/CT scan showed a single metabolically active sternal lesion without any overt bony destruction. Bone marrow biopsy done on 06/06/2017 - shows no overt involvement with multiple myeloma.  04/24/18 CT Soft Tissue Neck revealed Left laryngeal 3 cm soft tissue mass appears to have originated within and is expanding the left thyroid cartilage. This is new since the August 2018 PET-CT, and there is absent lymphadenopathy. 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 Primary cartilaginous neoplasm, squamous cell carcinoma, and other differential considerations are less likely. Superimposed widespread multiple myeloma lesions in the visible skeleton.  05/08/18 Left neck thyroid cartilage biopsy revealed Plasma cell neoplasm.  05/22/18 PET/CT revealed Soft tissue mass centered around the destroyed left thyroid cartilage is hypermetabolic and consistent with known plasmacytoma. No adenopathy in  the neck. 2. Hypermetabolic 3 cm cutaneous and subcutaneous soft tissue mass involving the posterior upper thorax suspicious for cutaneous manifestation of myeloma (plasmacytoma). 3. Persistent and slightly progressive hypermetabolic sternal lesion. 4. Diffuse lytic myelomatous lesions throughout the bony structures but no other hypermetabolic foci. 5. Focus of hypermetabolism in the left aspect of the lower prostate gland, not present on the prior study. This could be an area of infection/inflammation or potential prostate cancer. Recommend correlation with physical examination and PSA level.    Completed RT between 06/04/18 and 06/15/18 of 25 Gy by 10 fractions  The pt pursued a clinical trial at the [facility] with RT and immunotherapy in October 2019 through October 18, 2018 with further information available at [email or web address]  10/18/18 M Protein increased to 3.5g, from 0.7g at our last visit on 07/10/18. 10/02/18 PET/CT from [facility] indicated new hypermetabolism in left clavicle, left scapula, bilateral ribs, spine, and left proximal femur 10/18/18 MRI from [facility] ndicated a pathologic fracture at T12   11/23/18 NM Bone revealed Patchy uptake throughout the ribs and spine with areas of focally increased activity in both T12 pedicles and the right L1 pedicle. This focal activity could be stress related without clear corresponding fracture on available prior studies.  2) h/o Elevated bilirubin level . ? revlmid vs Gilberts  3) PET/CT abnormality in the Prostate. PSA levels WNL -Primary care physician to consider urology referral  4) Abnormal focus of FDG avid at a spot in the liver - will need to be monitor on f/u scans. No pain or other symptoms at this time.  PLAN:   -Discussed pt labwork today, 12/11/18; blood counts and chemistries are stable, calcium continues to be normalized -Last MMP from 11/27/18 revealed M Protein decreased to 2.8 from 4.3 on 11/06/18 -The pt has no  prohibitive toxicities from continuing C2D8 Daratumumab, Dexamethasone, and Carfilzomib at this time. -Will increase Carfilzomib tomorrow from 53m/m2 to 332mm2, and next week will be 5650m2 -Recommend C-shaped pillow to alleviate pressure -Continue 19m50mentanyl patch and Oxycodone for break through pain -Continue with outpatient PT -Baseline laxative use of 2 pills Senna S at night, and one Miralax. Back off if diarrhea develops. -100-150cc Magnesium citrate if no bowel movement is achieved after 2-3 days -Flexeril prn for muscle relaxation, pt will use this minimally and infrequently. Stop Valium, which pt was using infrequently. -Continue Zometa every 4 weeks -Consume at least 60oz of water each day -Continue Acyclovir -Discussed Port placement- pt would like to wait on this -Continue salt and baking soda mouthwashes -Will see the pt back with C3   Please schedule remaining C2 and C3 of treatment as ordered. Labs weekly RTC with dr KaleIrene Limbo1    All of the patients questions were answered with apparent satisfaction. The patient knows to call the clinic with any problems, questions or concerns.  The total time spent in the appt was 30 minutes and more than 50% was on counseling and direct patient cares.    GautSullivan LoneMS AHillsboroIVMS [facility] Centeratology/Oncology Physician [facility] LLCffice):       [phone] cell):  [phone]):           [phone] Schu[place]aldwin [place] acting as a scribe for Dr. GautSullivan Lone.I have reviewed the above documentation for accuracy and completeness, and I agree with the above. .GauBrunetta Genera

## 2018-12-11 ENCOUNTER — Inpatient Hospital Stay: Payer: Medicare Other

## 2018-12-11 ENCOUNTER — Inpatient Hospital Stay (HOSPITAL_BASED_OUTPATIENT_CLINIC_OR_DEPARTMENT_OTHER): Payer: Medicare Other | Admitting: Hematology

## 2018-12-11 ENCOUNTER — Other Ambulatory Visit: Payer: Self-pay

## 2018-12-11 ENCOUNTER — Ambulatory Visit: Payer: Medicare Other | Admitting: Physical Therapy

## 2018-12-11 ENCOUNTER — Other Ambulatory Visit: Payer: Medicare Other

## 2018-12-11 ENCOUNTER — Encounter: Payer: Self-pay | Admitting: Physical Therapy

## 2018-12-11 VITALS — BP 137/77 | HR 87 | Temp 97.9°F | Resp 18 | Ht 73.0 in | Wt 227.3 lb

## 2018-12-11 DIAGNOSIS — G893 Neoplasm related pain (acute) (chronic): Secondary | ICD-10-CM

## 2018-12-11 DIAGNOSIS — M25511 Pain in right shoulder: Secondary | ICD-10-CM

## 2018-12-11 DIAGNOSIS — R29898 Other symptoms and signs involving the musculoskeletal system: Secondary | ICD-10-CM | POA: Diagnosis not present

## 2018-12-11 DIAGNOSIS — C9002 Multiple myeloma in relapse: Secondary | ICD-10-CM

## 2018-12-11 DIAGNOSIS — M6281 Muscle weakness (generalized): Secondary | ICD-10-CM | POA: Diagnosis not present

## 2018-12-11 DIAGNOSIS — M25512 Pain in left shoulder: Secondary | ICD-10-CM

## 2018-12-11 DIAGNOSIS — M546 Pain in thoracic spine: Secondary | ICD-10-CM | POA: Diagnosis not present

## 2018-12-11 DIAGNOSIS — Z5112 Encounter for antineoplastic immunotherapy: Secondary | ICD-10-CM

## 2018-12-11 DIAGNOSIS — Z79899 Other long term (current) drug therapy: Secondary | ICD-10-CM

## 2018-12-11 DIAGNOSIS — Z23 Encounter for immunization: Secondary | ICD-10-CM | POA: Diagnosis not present

## 2018-12-11 LAB — CBC WITH DIFFERENTIAL/PLATELET
Abs Immature Granulocytes: 0.03 10*3/uL (ref 0.00–0.07)
BASOS PCT: 0 %
Basophils Absolute: 0 10*3/uL (ref 0.0–0.1)
EOS ABS: 0.1 10*3/uL (ref 0.0–0.5)
Eosinophils Relative: 2 %
HCT: 33.7 % — ABNORMAL LOW (ref 39.0–52.0)
Hemoglobin: 11.2 g/dL — ABNORMAL LOW (ref 13.0–17.0)
Immature Granulocytes: 1 %
Lymphocytes Relative: 7 %
Lymphs Abs: 0.4 10*3/uL — ABNORMAL LOW (ref 0.7–4.0)
MCH: 32.1 pg (ref 26.0–34.0)
MCHC: 33.2 g/dL (ref 30.0–36.0)
MCV: 96.6 fL (ref 80.0–100.0)
Monocytes Absolute: 0.5 10*3/uL (ref 0.1–1.0)
Monocytes Relative: 8 %
Neutro Abs: 5 10*3/uL (ref 1.7–7.7)
Neutrophils Relative %: 82 %
Platelets: 224 10*3/uL (ref 150–400)
RBC: 3.49 MIL/uL — ABNORMAL LOW (ref 4.22–5.81)
RDW: 15.2 % (ref 11.5–15.5)
WBC: 6.1 10*3/uL (ref 4.0–10.5)
nRBC: 0 % (ref 0.0–0.2)

## 2018-12-11 LAB — CMP (CANCER CENTER ONLY)
ALT: 11 U/L (ref 0–44)
AST: 14 U/L — ABNORMAL LOW (ref 15–41)
Albumin: 3.3 g/dL — ABNORMAL LOW (ref 3.5–5.0)
Alkaline Phosphatase: 141 U/L — ABNORMAL HIGH (ref 38–126)
Anion gap: 7 (ref 5–15)
BILIRUBIN TOTAL: 0.8 mg/dL (ref 0.3–1.2)
BUN: 9 mg/dL (ref 8–23)
CALCIUM: 9.3 mg/dL (ref 8.9–10.3)
CO2: 27 mmol/L (ref 22–32)
Chloride: 102 mmol/L (ref 98–111)
Creatinine: 0.82 mg/dL (ref 0.61–1.24)
GFR, Estimated: >60 mL/min (ref >60)
GLUCOSE: 140 mg/dL — AB (ref 70–99)
Potassium: 3.9 mmol/L (ref 3.5–5.1)
Sodium: 136 mmol/L (ref 135–145)
TOTAL PROTEIN: 8.3 g/dL — AB (ref 6.5–8.1)

## 2018-12-11 NOTE — Therapy (Signed)
[place], [place], [address] Phone: [phone]   Fax:  [phone]  Physical Therapy Treatment  Patient Details  Name: Michael [name] MRN: [id number] Date of Birth: [birth year] Referring Provider (PT): Dr. Irene Limbo    Encounter Date: 12/11/2018  PT End of Session - 12/11/18 1656    Visit Number  2    Number of Visits  9    Date for PT Re-Evaluation  01/04/19    PT Start Time  6286   pt arrived late   PT Stop Time  1605    PT Time Calculation (min)  34 min    Activity Tolerance  Patient tolerated treatment well    Behavior During Therapy  [facility] for tasks assessed/performed       Past Medical History:  Diagnosis Date  . Angioedema   . CPAP (continuous positive airway pressure) dependence    uses at night  . Gastroesophageal reflux disease without esophagitis   . History of radiation therapy 06/04/18- 06/15/18   25 Gy in 10 fractions to the laryngeal/ cartilage lesion  . Hyperlipidemia   . Multiple myeloma ([place]) 2012   Treated at [facility]  . Non morbid obesity due to excess calories   . Prediabetes   . Prediabetes   . Unspecified glaucoma(365.9)   . Urticaria, unspecified     Past Surgical History:  Procedure Laterality Date  . IR RADIOLOGIST EVAL & MGMT  11/13/2018  . PORTACATH PLACEMENT    . PORTACATH PLACEMENT     removed 02/2016    There were no vitals filed for this visit.  Subjective Assessment - 12/11/18 1535    Subjective  This is norm for me after I lay down. I went home and took a nap. It takes a while to get warm up.     Pertinent History  diagnosed in April 2012 in [place]. and was in a clinical trial at [place] and had functional independence at that time. In 2016, he retired from [employer] and moved to [facility] and under the care of Dr. Irene Limbo.  He again began treatment for multiple myeloma. He joined another clinical trial in Nov. and switched back to [facility] for treatment, But the cancer had  progressed  by Jan 2020 possibly from clinical trial drug.  That was put on hold and he is back under care of Dr. Irene Limbo.  Before Dec. he had pretty good funcitoning     Patient Stated Goals  Pt wants to know what he is allowed to do and what he is not allowed to do in relation to the cancer?    Currently in Pain?  Yes    Pain Score  4     Pain Location  Back    Pain Orientation  Lower    Pain Descriptors / Indicators  Aching                       [facility] Adult PT Treatment/Exercise - 12/11/18 0001      Bed Mobility   Bed Mobility  Sit to Supine;Supine to Sit    Supine to Sit  Supervision/Verbal cueing   educated pt to decrease dependence on shoulder   Sit to Supine  Supervision/Verbal cueing   educated pt to decrease dependence on shoulders     Exercises   Exercises  Lumbar      Lumbar Exercises: Sidelying   Other Sidelying Lumbar Exercises  Instructed pt in Meeks  decompression exercise and had him return demonstrate holding all positions for 3 sec and performing 5 reps each                  PT Long Term Goals - 12/06/18 1707      PT LONG TERM GOAL #1   Title  Pt will have painfree elevation of both shoulders to 115 degress so that he can perform his activities of daily life easeier     Time  4    Period  Weeks    Status  New      PT LONG TERM GOAL #2   Title  Pt will have a HEP for isometrics and trunk extension exercises so that he can maintain and improve strength at home     Time  4    Period  Weeks    Status  New      PT LONG TERM GOAL #3   Title  Pt will decrease his Normal TUG time to < 12 seconds indicating a functional balance and mobility improvment     Baseline  15.08    Time  4    Period  Weeks    Status  New            Plan - 12/11/18 1657    Clinical Impression Statement  Assessed pt's bed mobility today. He relies extensively on his shoulders to pull up from supine to sidelying and also to go from sidelying to sitting.  Educated pt to rely more on his legs to stand instead of his arms. Instructed pt in Meeks decompression exercises for his back and pt was able to demonstrate these today without increased pain. He does have a very soft mattress at home that makes bed mobility, transfers and supine exercises more difficult.     Rehab Potential  Good    Clinical Impairments Affecting Rehab Potential  acute compression fracture of T12 , recent cortisone shot in left shoulder     PT Frequency  2x / week    PT Duration  4 weeks    PT Treatment/Interventions  ADLs/Self Care Home Management;Moist Heat;Iontophoresis 4m/ml Dexamethasone;DME Instruction;Stair training;Functional mobility training;Neuromuscular re-education;Therapeutic exercise;Therapeutic activities;Patient/family education    PT Next Visit Plan  ionto for right shoulder, instruct in precautions for compression fracture: avoid spinal flexion and rotation and see if new bed mobility techniques are helping, assess indep with Meeks Instruct in HEP for scapular retraction, low rows, wall taps and glute sets, gentle shoulder ROM, sit to stand from raised table and progression to AROM as able withing pain limits, try ionto to right shoulder if cert signed and pt has not gotten a shot in it  consider use of TENS    Consulted and Agree with Plan of Care  Patient       Patient will benefit from skilled therapeutic intervention in order to improve the following deficits and impairments:  Abnormal gait, Pain, Postural dysfunction, Decreased mobility, Decreased activity tolerance, Decreased endurance, Decreased range of motion, Decreased strength, Impaired perceived functional ability, Impaired UE functional use, Obesity, Difficulty walking, Decreased balance, Decreased knowledge of precautions  Visit Diagnosis: Pain in thoracic spine  Acute pain of right shoulder  Acute pain of left shoulder  Muscle weakness (generalized)     Problem List Patient Active  Problem List   Diagnosis Date Noted  . Counseling regarding advance care planning and goals of care 10/28/2018  . Multiple myeloma ([place] 03/28/2017    BAllyson SabalBAver[facility]  12/11/2018, 5:01 PM  [place], [place], [address] Phone: [phone]   Fax:  [phone]  Name: Michael [name] MRN: [id number] Date of Birth: [birth year]  Manus Gunning, PT 12/11/18 5:01 PM

## 2018-12-11 NOTE — Patient Instructions (Signed)

## 2018-12-12 ENCOUNTER — Inpatient Hospital Stay: Payer: Medicare Other | Admitting: Nutrition

## 2018-12-12 ENCOUNTER — Other Ambulatory Visit: Payer: Medicare Other

## 2018-12-12 ENCOUNTER — Telehealth: Payer: Self-pay | Admitting: Hematology

## 2018-12-12 ENCOUNTER — Inpatient Hospital Stay: Payer: Medicare Other

## 2018-12-12 VITALS — BP 144/81 | HR 89 | Temp 99.1°F | Resp 18

## 2018-12-12 DIAGNOSIS — G893 Neoplasm related pain (acute) (chronic): Secondary | ICD-10-CM | POA: Diagnosis not present

## 2018-12-12 DIAGNOSIS — Z5112 Encounter for antineoplastic immunotherapy: Secondary | ICD-10-CM | POA: Diagnosis not present

## 2018-12-12 DIAGNOSIS — Z7189 Other specified counseling: Secondary | ICD-10-CM

## 2018-12-12 DIAGNOSIS — Z79899 Other long term (current) drug therapy: Secondary | ICD-10-CM | POA: Diagnosis not present

## 2018-12-12 DIAGNOSIS — C9002 Multiple myeloma in relapse: Secondary | ICD-10-CM | POA: Diagnosis not present

## 2018-12-12 DIAGNOSIS — C9000 Multiple myeloma not having achieved remission: Secondary | ICD-10-CM

## 2018-12-12 DIAGNOSIS — Z23 Encounter for immunization: Secondary | ICD-10-CM | POA: Diagnosis not present

## 2018-12-12 MED ORDER — FAMOTIDINE IN NACL 20-0.9 MG/50ML-% IV SOLN: 20.0000 mg | Freq: Once | INTRAVENOUS | Status: DC

## 2018-12-12 MED ORDER — PROCHLORPERAZINE MALEATE 10 MG PO TABS
ORAL_TABLET | ORAL | Status: AC
  Filled 2018-12-12: qty 1

## 2018-12-12 MED ORDER — DIPHENHYDRAMINE HCL 25 MG PO CAPS
ORAL_CAPSULE | ORAL | Status: AC
  Filled 2018-12-12: qty 2

## 2018-12-12 MED ORDER — METHYLPREDNISOLONE SODIUM SUCC 125 MG IJ SOLR
INTRAMUSCULAR | Status: AC
  Filled 2018-12-12: qty 2

## 2018-12-12 MED ORDER — ACETAMINOPHEN 325 MG PO TABS
ORAL_TABLET | ORAL | Status: AC
  Filled 2018-12-12: qty 2

## 2018-12-12 MED ORDER — SODIUM CHLORIDE 0.9 % IV SOLN
Freq: Once | INTRAVENOUS | Status: AC
  Administered 2018-12-12: 09:00:00 via INTRAVENOUS
  Filled 2018-12-12: qty 250

## 2018-12-12 MED ORDER — SODIUM CHLORIDE 0.9 % IV SOLN
20.0000 mg | Freq: Once | INTRAVENOUS | Status: AC
  Administered 2018-12-12: 20 mg via INTRAVENOUS
  Filled 2018-12-12: qty 2

## 2018-12-12 MED ORDER — SODIUM CHLORIDE 0.9 % IV SOLN
Freq: Once | INTRAVENOUS | Status: AC
  Administered 2018-12-12: 08:00:00 via INTRAVENOUS
  Filled 2018-12-12: qty 250

## 2018-12-12 MED ORDER — METHYLPREDNISOLONE SODIUM SUCC 125 MG IJ SOLR
100.0000 mg | Freq: Once | INTRAMUSCULAR | Status: AC
  Administered 2018-12-12: 100 mg via INTRAVENOUS

## 2018-12-12 MED ORDER — PROCHLORPERAZINE MALEATE 10 MG PO TABS
10.0000 mg | ORAL_TABLET | Freq: Once | ORAL | Status: AC
  Administered 2018-12-12: 10 mg via ORAL

## 2018-12-12 MED ORDER — MONTELUKAST SODIUM 10 MG PO TABS
ORAL_TABLET | ORAL | Status: AC
  Filled 2018-12-12: qty 1

## 2018-12-12 MED ORDER — MONTELUKAST SODIUM 10 MG PO TABS
10.0000 mg | ORAL_TABLET | Freq: Once | ORAL | Status: AC
  Administered 2018-12-12: 10 mg via ORAL

## 2018-12-12 MED ORDER — ACETAMINOPHEN 325 MG PO TABS
650.0000 mg | ORAL_TABLET | Freq: Once | ORAL | Status: AC
  Administered 2018-12-12: 650 mg via ORAL

## 2018-12-12 MED ORDER — DEXTROSE 5 % IV SOLN
80.0000 mg | Freq: Once | INTRAVENOUS | Status: AC
  Administered 2018-12-12: 80 mg via INTRAVENOUS
  Filled 2018-12-12: qty 30

## 2018-12-12 MED ORDER — DIPHENHYDRAMINE HCL 25 MG PO CAPS
50.0000 mg | ORAL_CAPSULE | Freq: Once | ORAL | Status: AC
  Administered 2018-12-12: 50 mg via ORAL

## 2018-12-12 MED ORDER — SODIUM CHLORIDE 0.9 % IV SOLN
1700.0000 mg | Freq: Once | INTRAVENOUS | Status: AC
  Administered 2018-12-12: 1700 mg via INTRAVENOUS
  Filled 2018-12-12: qty 80

## 2018-12-12 NOTE — Progress Notes (Signed)
Nutrition Follow-up:  68 year old male with multiple myeloma followed by Dr. Irene Limbo seen in infusion today. Patient complains of constipation related to pain medications. He reports not taking pain medications until he has severe pain to manage constipation. Constipation relieved by Murelax and 6oz of Prune juice. RD encouraged patient to increase H2O intake and taking Murelax daily to stay ahead of constipation; also provided handout.   Patient has lost 1.32 lbs since last follow-up on 2/19 and reports dislike of Boost, stating that it was too chalky and liking the Atkins supplement better. RD provided samples of Ensure and suggested CIB ONS over Atkins due to its lack of protein and calories.   Patient has decreased energy and stated that he is too tired to cook and experiences occasional nausea when food is being prepared. He reports that he does not take his nausea medications and was encouraged to take prior to cooking and suggested opening windows when meals are being prepared.    Medications: MVI, Darzalex, Kyprolis  Labs: Glucose 140 (H)  Anthropometrics:   Height: 6'1" Weight: 103.1 kg (227.5 lbs) UBW: 230 lbs BMI: 29.99   Estimated Energy Needs  Kcals: 2500 -3000 Protein: 145-165 grams Fluid: 2.5L/day  NUTRITION DIAGNOSIS: Increased calorie and protein needs related to cancer and cancer related treatments as evidenced by estimated needs  -continues   MALNUTRITION DIAGNOSIS:  None at this time; will continue to monitor   INTERVENTION: Discussed importance of maintaining weight and ways to incorporate more calories and protein.  Provided samples/coupons of Ensure and CIB Encouraged patient to take nausea and pain medication when needed Provided handout for constipation    MONITORING, EVALUATION, GOAL: weight trends, labs, supplement acceptance, intake   NEXT VISIT: not scheduled at this time  Lajuan Lines, RD, LDN  After Hours/Weekend Pager:  [phone]

## 2018-12-12 NOTE — Patient Instructions (Signed)
[facility] Discharge Instructions for Patients Receiving Chemotherapy  Today you received the following chemotherapy agents: Daratumumab (Darzalex), Carfilomib (Kyprolis)  To help prevent nausea and vomiting after your treatment, we encourage you to take your nausea medication as directed.    If you develop nausea and vomiting that is not controlled by your nausea medication, call the clinic.   BELOW ARE SYMPTOMS THAT SHOULD BE REPORTED IMMEDIATELY:  *FEVER GREATER THAN 100.5 F  *CHILLS WITH OR WITHOUT FEVER  NAUSEA AND VOMITING THAT IS NOT CONTROLLED WITH YOUR NAUSEA MEDICATION  *UNUSUAL SHORTNESS OF BREATH  *UNUSUAL BRUISING OR BLEEDING  TENDERNESS IN MOUTH AND THROAT WITH OR WITHOUT PRESENCE OF ULCERS  *URINARY PROBLEMS  *BOWEL PROBLEMS  UNUSUAL RASH Items with * indicate a potential emergency and should be followed up as soon as possible.  Feel free to call the clinic should you have any questions or concerns. The clinic phone number is [phone].  Please show the [place] at check-in to the Emergency Department and triage nurse.  Carfilzomib injection What is this medicine? CARFILZOMIB (kar FILZ oh mib) targets a specific protein within cancer cells and stops the cancer cells from growing. It is used to treat multiple myeloma. This medicine may be used for other purposes; ask your health care provider or pharmacist if you have questions. COMMON BRAND NAME(S): KYPROLIS What should I tell my health care provider before I take this medicine? They need to know if you have any of these conditions: -heart disease -history of blood clots -irregular heartbeat -kidney disease -liver disease -lung or breathing disease -an unusual or allergic reaction to carfilzomib, or other medicines, foods, dyes, or preservatives -pregnant or trying to get pregnant -breast-feeding How should I use this medicine? This medicine is for injection or infusion  into a vein. It is given by a health care professional in a hospital or clinic setting. Talk to your pediatrician regarding the use of this medicine in children. Special care may be needed. Overdosage: If you think you have taken too much of this medicine contact a poison control center or emergency room at once. NOTE: This medicine is only for you. Do not share this medicine with others. What if I miss a dose? It is important not to miss your dose. Call your doctor or health care professional if you are unable to keep an appointment. What may interact with this medicine? Interactions are not expected. Give your health care provider a list of all the medicines, herbs, non-prescription drugs, or dietary supplements you use. Also tell them if you smoke, drink alcohol, or use illegal drugs. Some items may interact with your medicine. This list may not describe all possible interactions. Give your health care provider a list of all the medicines, herbs, non-prescription drugs, or dietary supplements you use. Also tell them if you smoke, drink alcohol, or use illegal drugs. Some items may interact with your medicine. What should I watch for while using this medicine? Your condition will be monitored carefully while you are receiving this medicine. Report any side effects. Continue your course of treatment even though you feel ill unless your doctor tells you to stop. You may need blood work done while you are taking this medicine. Do not become pregnant while taking this medicine or for at least 6 months after stopping it. Women should inform their doctor if they wish to become pregnant or think they might be pregnant. There is a potential for serious side effects  to an unborn child. Men should not father a child while taking this medicine and for at least 3 months after stopping it. Talk to your health care professional or pharmacist for more information. Do not breast-feed an infant while taking this  medicine or for 2 weeks after the last dose. Check with your doctor or health care professional if you get an attack of severe diarrhea, nausea and vomiting, or if you sweat a lot. The loss of too much body fluid can make it dangerous for you to take this medicine. You may get dizzy. Do not drive, use machinery, or do anything that needs mental alertness until you know how this medicine affects you. Do not stand or sit up quickly, especially if you are an older patient. This reduces the risk of dizzy or fainting spells. What side effects may I notice from receiving this medicine? Side effects that you should report to your doctor or health care professional as soon as possible: -allergic reactions like skin rash, itching or hives, swelling of the face, lips, or tongue -confusion -dizziness -feeling faint or lightheaded -fever or chills -palpitations -seizures -signs and symptoms of bleeding such as bloody or black, tarry stools; red or dark-brown urine; spitting up blood or brown material that looks like coffee grounds; red spots on the skin; unusual bruising or bleeding including from the eye, gums, or nose -signs and symptoms of a blood clot such as breathing problems; changes in vision; chest pain; severe, sudden headache; pain, swelling, warmth in the leg; trouble speaking; sudden numbness or weakness of the face, arm or leg -signs and symptoms of kidney injury like trouble passing urine or change in the amount of urine -signs and symptoms of liver injury like dark yellow or brown urine; general ill feeling or flu-like symptoms; light-colored stools; loss of appetite; nausea; right upper belly pain; unusually weak or tired; yellowing of the eyes or skin Side effects that usually do not require medical attention (report to your doctor or health care professional if they continue or are bothersome): -back pain -cough -diarrhea -headache -muscle cramps -vomiting This list may not describe all  possible side effects. Call your doctor for medical advice about side effects. You may report side effects to FDA at 1-800-FDA-1088. Where should I keep my medicine? This drug is given in a hospital or clinic and will not be stored at home. NOTE: This sheet is a summary. It may not cover all possible information. If you have questions about this medicine, talk to your doctor, pharmacist, or health care provider.  2019 Elsevier/Gold Standard (2017-07-19 14:07:13)

## 2018-12-12 NOTE — Addendum Note (Signed)
Addended by: Neysa Hotter on: 12/12/2018 04:36 PM   Modules accepted: Orders

## 2018-12-12 NOTE — Telephone Encounter (Signed)
Scheduled appt per 02/25 los.  Treatment area will print out his new appt calendar.

## 2018-12-17 ENCOUNTER — Ambulatory Visit: Payer: Medicare Other | Attending: Hematology

## 2018-12-17 DIAGNOSIS — M6281 Muscle weakness (generalized): Secondary | ICD-10-CM | POA: Diagnosis not present

## 2018-12-17 DIAGNOSIS — M25511 Pain in right shoulder: Secondary | ICD-10-CM | POA: Diagnosis not present

## 2018-12-17 DIAGNOSIS — M546 Pain in thoracic spine: Secondary | ICD-10-CM | POA: Diagnosis not present

## 2018-12-17 DIAGNOSIS — R29898 Other symptoms and signs involving the musculoskeletal system: Secondary | ICD-10-CM

## 2018-12-17 DIAGNOSIS — M25512 Pain in left shoulder: Secondary | ICD-10-CM | POA: Insufficient documentation

## 2018-12-17 NOTE — Patient Instructions (Signed)
-  Remove ionto patch around 4-6 hrs (1230-230 on Mar 2)  -Add glut squeezes when in sitting or laying holding for 5 sec, as many as tolerated  -Cont working with [employer] Decompression exs (the ones issued at previous visit)   Cancer Rehab [phone]

## 2018-12-17 NOTE — Therapy (Signed)
[place], [place], [address] Phone: [phone]   Fax:  [phone]  Physical Therapy Treatment  Patient Details  Name: Michael [name] MRN: [id number] Date of Birth: Mar 23, 1951 Referring Provider (PT): Dr. Irene Limbo    Encounter Date: 12/17/2018  PT End of Session - 12/17/18 1254    Visit Number  3    Number of Visits  9    Date for PT Re-Evaluation  01/04/19    PT Start Time  0811   pt arrived late   PT Stop Time  0851    PT Time Calculation (min)  40 min    Activity Tolerance  Patient limited by pain    Behavior During Therapy  [facility] for tasks assessed/performed       Past Medical History:  Diagnosis Date  . Angioedema   . CPAP (continuous positive airway pressure) dependence    uses at night  . Gastroesophageal reflux disease without esophagitis   . History of radiation therapy 06/04/18- 06/15/18   25 Gy in 10 fractions to the laryngeal/ cartilage lesion  . Hyperlipidemia   . Multiple myeloma ([place]) 2012   Treated at [facility]  . Non morbid obesity due to excess calories   . Prediabetes   . Prediabetes   . Unspecified glaucoma(365.9)   . Urticaria, unspecified     Past Surgical History:  Procedure Laterality Date  . IR RADIOLOGIST EVAL & MGMT  11/13/2018  . PORTACATH PLACEMENT    . PORTACATH PLACEMENT     removed 02/2016    There were no vitals filed for this visit.  Subjective Assessment - 12/17/18 0819    Subjective  I did not feel good after the last visit. I didn't try any of the bed mobility bc my bed is just too soft and I want to do it on the floor but that wasn't going to happen this weekend.     Pertinent History  diagnosed in April 2012 in [place]. and was in a clinical trial at [place] and had functional independence at that time. In 2016, he retired from [employer] and moved to [facility] and under the care of Dr. Irene Limbo.  He again began treatment for multiple myeloma. He joined another clinical  trial in Nov. and switched back to [facility] for treatment, But the cancer had progressed  by Jan 2020 possibly from clinical trial drug.  That was put on hold and he is back under care of Dr. Irene Limbo.  Before Dec. he had pretty good funcitoning     Patient Stated Goals  Pt wants to know what he is allowed to do and what he is not allowed to do in relation to the cancer?    Pain Score  4     Pain Location  Chest    Pain Descriptors / Indicators  Tightness   deep breaths are uncomfortable   Pain Type  Chronic pain    Pain Onset  More than a month ago    Pain Frequency  Constant    Aggravating Factors   the new chemo    Pain Relieving Factors  laying down                       [facility] Adult PT Treatment/Exercise - 12/17/18 0001      Self-Care   Self-Care  Other Self-Care Comments    Other Self-Care Comments   Spent alot of time answering pts questions regarding  how to incorporate exs into his day as he is able and to work where he can get. He wanted to wait to do exs until he could do them on the floor but this might not be for awhile so instructed him to start  for now with Meeks and glut squeezes as these can be done in bed or in recliner. He verbalized understanding      Lumbar Exercises: Supine   Other Supine Lumbar Exercises  Reviewed MEeks decompression but only head press and shoulder press as pt needed to sit up due to pain      Knee/Hip Exercises: Seated   Other Seated Knee/Hip Exercises  Glut squeezes 5x and explained importance of incorporating these and Meeks Decompression exs into his day.       Modalities   Modalities  Iontophoresis      Iontophoresis   Type of Iontophoresis  Dexamethasone    Location  Rt anterior joint capsule    Dose  4 mg/mL    Time  4-6 hr wear (to remove before 230)             PT Education - 12/17/18 0849    Education Details  Reviewed Meeks, added glut squeezes and educated pt on iontophoresis contrainidications and when to remove  patch    Person(s) Educated  Patient    Methods  Explanation;Demonstration;Handout    Comprehension  Verbalized understanding;Returned demonstration;Need further instruction          PT Long Term Goals - 12/06/18 1707      PT LONG TERM GOAL #1   Title  Pt will have painfree elevation of both shoulders to 115 degress so that he can perform his activities of daily life easeier     Time  4    Period  Weeks    Status  New      PT LONG TERM GOAL #2   Title  Pt will have a HEP for isometrics and trunk extension exercises so that he can maintain and improve strength at home     Time  4    Period  Weeks    Status  New      PT LONG TERM GOAL #3   Title  Pt will decrease his Normal TUG time to < 12 seconds indicating a functional balance and mobility improvment     Baseline  15.08    Time  4    Period  Weeks    Status  New            Plan - 12/17/18 1255    Clinical Impression Statement  Pt arrived late and is very slow moving due to pain. Also could not tolerate laying supine long as his back was hurting. Spent aot of session answering his questions about exercising in varying positions as his pain allowed and he verbalized understanding. Encouraged him to try Meeks and add glut squeezes explaining these will help promote improved posture without flaring up back pain.  Also reviewed importance of avoiding spinal flexion and rotation.     Rehab Potential  Good    Clinical Impairments Affecting Rehab Potential  acute compression fracture of T12 , recent cortisone shot in left shoulder     PT Frequency  2x / week    PT Duration  4 weeks    PT Treatment/Interventions  ADLs/Self Care Home Management;Moist Heat;Iontophoresis 64m/ml Dexamethasone;DME Instruction;Stair training;Functional mobility training;Neuromuscular re-education;Therapeutic exercise;Therapeutic activities;Patient/family education    PT Next Visit Plan  Assess ionto for right shoulder, cont to review precautions for  compression fracture: avoid spinal flexion and rotation and see if he tried new bed mobility techniques and if they are helping, assess indep with Meeks Instruct in HEP for scapular retraction, low rows, wall taps and glute sets, gentle shoulder ROM, sit to stand from raised table and progression to AROM as able withing pain limits, consider use of TENS    Consulted and Agree with Plan of Care  Patient       Patient will benefit from skilled therapeutic intervention in order to improve the following deficits and impairments:  Abnormal gait, Pain, Postural dysfunction, Decreased mobility, Decreased activity tolerance, Decreased endurance, Decreased range of motion, Decreased strength, Impaired perceived functional ability, Impaired UE functional use, Obesity, Difficulty walking, Decreased balance, Decreased knowledge of precautions  Visit Diagnosis: Pain in thoracic spine  Acute pain of right shoulder  Acute pain of left shoulder  Muscle weakness (generalized)  Other symptoms and signs involving the musculoskeletal system     Problem List Patient Active Problem List   Diagnosis Date Noted  . Counseling regarding advance care planning and goals of care 10/28/2018  . Multiple myeloma ([place]) 03/28/2017    Otelia Limes, PTA 12/17/2018, 12:58 PM  [place], [place], [address] Phone: [phone]   Fax:  [phone]  Name: Michael [name] MRN: [id number] Date of Birth: April 20, 1951

## 2018-12-18 ENCOUNTER — Inpatient Hospital Stay: Payer: Medicare Other | Attending: Hematology

## 2018-12-18 DIAGNOSIS — E785 Hyperlipidemia, unspecified: Secondary | ICD-10-CM | POA: Diagnosis not present

## 2018-12-18 DIAGNOSIS — Z79899 Other long term (current) drug therapy: Secondary | ICD-10-CM | POA: Diagnosis not present

## 2018-12-18 DIAGNOSIS — Z7982 Long term (current) use of aspirin: Secondary | ICD-10-CM | POA: Diagnosis not present

## 2018-12-18 DIAGNOSIS — Z5112 Encounter for antineoplastic immunotherapy: Secondary | ICD-10-CM | POA: Insufficient documentation

## 2018-12-18 DIAGNOSIS — Z9221 Personal history of antineoplastic chemotherapy: Secondary | ICD-10-CM | POA: Diagnosis not present

## 2018-12-18 DIAGNOSIS — E119 Type 2 diabetes mellitus without complications: Secondary | ICD-10-CM | POA: Insufficient documentation

## 2018-12-18 DIAGNOSIS — H409 Unspecified glaucoma: Secondary | ICD-10-CM | POA: Insufficient documentation

## 2018-12-18 DIAGNOSIS — K219 Gastro-esophageal reflux disease without esophagitis: Secondary | ICD-10-CM | POA: Diagnosis not present

## 2018-12-18 DIAGNOSIS — C9002 Multiple myeloma in relapse: Secondary | ICD-10-CM | POA: Diagnosis not present

## 2018-12-18 LAB — CBC WITH DIFFERENTIAL/PLATELET
Abs Immature Granulocytes: 0.02 10*3/uL (ref 0.00–0.07)
Basophils Absolute: 0 10*3/uL (ref 0.0–0.1)
Basophils Relative: 0 %
EOS ABS: 0.1 10*3/uL (ref 0.0–0.5)
Eosinophils Relative: 2 %
HCT: 33.7 % — ABNORMAL LOW (ref 39.0–52.0)
Hemoglobin: 11.2 g/dL — ABNORMAL LOW (ref 13.0–17.0)
Immature Granulocytes: 0 %
Lymphocytes Relative: 12 %
Lymphs Abs: 0.6 10*3/uL — ABNORMAL LOW (ref 0.7–4.0)
MCH: 32.7 pg (ref 26.0–34.0)
MCHC: 33.2 g/dL (ref 30.0–36.0)
MCV: 98.5 fL (ref 80.0–100.0)
Monocytes Absolute: 0.4 10*3/uL (ref 0.1–1.0)
Monocytes Relative: 9 %
Neutro Abs: 3.4 10*3/uL (ref 1.7–7.7)
Neutrophils Relative %: 77 %
Platelets: 208 10*3/uL (ref 150–400)
RBC: 3.42 MIL/uL — ABNORMAL LOW (ref 4.22–5.81)
RDW: 15.3 % (ref 11.5–15.5)
WBC: 4.5 10*3/uL (ref 4.0–10.5)
nRBC: 0 % (ref 0.0–0.2)

## 2018-12-18 LAB — CMP (CANCER CENTER ONLY)
ALK PHOS: 150 U/L — AB (ref 38–126)
ALT: 11 U/L (ref 0–44)
AST: 12 U/L — ABNORMAL LOW (ref 15–41)
Albumin: 3.3 g/dL — ABNORMAL LOW (ref 3.5–5.0)
Anion gap: 6 (ref 5–15)
BUN: 10 mg/dL (ref 8–23)
CALCIUM: 9 mg/dL (ref 8.9–10.3)
CO2: 29 mmol/L (ref 22–32)
Chloride: 104 mmol/L (ref 98–111)
Creatinine: 0.85 mg/dL (ref 0.61–1.24)
GFR, Est AFR Am: >60 mL/min (ref >60)
GFR, Estimated: >60 mL/min (ref >60)
Glucose, Bld: 132 mg/dL — ABNORMAL HIGH (ref 70–99)
Potassium: 4.1 mmol/L (ref 3.5–5.1)
Sodium: 139 mmol/L (ref 135–145)
Total Bilirubin: 0.8 mg/dL (ref 0.3–1.2)
Total Protein: 7.6 g/dL (ref 6.5–8.1)

## 2018-12-19 ENCOUNTER — Inpatient Hospital Stay: Payer: Medicare Other

## 2018-12-19 ENCOUNTER — Other Ambulatory Visit: Payer: Medicare Other

## 2018-12-19 VITALS — BP 140/80 | HR 79 | Temp 98.9°F | Resp 18

## 2018-12-19 DIAGNOSIS — E785 Hyperlipidemia, unspecified: Secondary | ICD-10-CM | POA: Diagnosis not present

## 2018-12-19 DIAGNOSIS — Z9221 Personal history of antineoplastic chemotherapy: Secondary | ICD-10-CM | POA: Diagnosis not present

## 2018-12-19 DIAGNOSIS — C9002 Multiple myeloma in relapse: Secondary | ICD-10-CM | POA: Diagnosis not present

## 2018-12-19 DIAGNOSIS — C9000 Multiple myeloma not having achieved remission: Secondary | ICD-10-CM

## 2018-12-19 DIAGNOSIS — Z5112 Encounter for antineoplastic immunotherapy: Secondary | ICD-10-CM | POA: Diagnosis not present

## 2018-12-19 DIAGNOSIS — K219 Gastro-esophageal reflux disease without esophagitis: Secondary | ICD-10-CM | POA: Diagnosis not present

## 2018-12-19 DIAGNOSIS — E119 Type 2 diabetes mellitus without complications: Secondary | ICD-10-CM | POA: Diagnosis not present

## 2018-12-19 DIAGNOSIS — Z7189 Other specified counseling: Secondary | ICD-10-CM

## 2018-12-19 MED ORDER — METHYLPREDNISOLONE SODIUM SUCC 125 MG IJ SOLR
100.0000 mg | Freq: Once | INTRAMUSCULAR | Status: AC
  Administered 2018-12-19: 100 mg via INTRAVENOUS

## 2018-12-19 MED ORDER — SODIUM CHLORIDE 0.9 % IV SOLN
Freq: Once | INTRAVENOUS | Status: AC
  Administered 2018-12-19: 09:00:00 via INTRAVENOUS
  Filled 2018-12-19: qty 250

## 2018-12-19 MED ORDER — METHYLPREDNISOLONE SODIUM SUCC 125 MG IJ SOLR
INTRAMUSCULAR | Status: AC
  Filled 2018-12-19: qty 2

## 2018-12-19 MED ORDER — SODIUM CHLORIDE 0.9 % IV SOLN
20.0000 mg | Freq: Once | INTRAVENOUS | Status: AC
  Administered 2018-12-19: 20 mg via INTRAVENOUS
  Filled 2018-12-19: qty 2

## 2018-12-19 MED ORDER — ZOLEDRONIC ACID 4 MG/100ML IV SOLN
4.0000 mg | Freq: Once | INTRAVENOUS | Status: AC
  Administered 2018-12-19: 4 mg via INTRAVENOUS
  Filled 2018-12-19: qty 100

## 2018-12-19 MED ORDER — DEXTROSE 5 % IV SOLN
55.0000 mg/m2 | Freq: Once | INTRAVENOUS | Status: AC
  Administered 2018-12-19: 130 mg via INTRAVENOUS
  Filled 2018-12-19: qty 60

## 2018-12-19 MED ORDER — DIPHENHYDRAMINE HCL 25 MG PO CAPS
ORAL_CAPSULE | ORAL | Status: AC
  Filled 2018-12-19: qty 2

## 2018-12-19 MED ORDER — SODIUM CHLORIDE 0.9 % IV SOLN
INTRAVENOUS | Status: DC
  Administered 2018-12-19: 08:00:00 via INTRAVENOUS
  Filled 2018-12-19: qty 250

## 2018-12-19 MED ORDER — DIPHENHYDRAMINE HCL 25 MG PO CAPS
50.0000 mg | ORAL_CAPSULE | Freq: Once | ORAL | Status: AC
  Administered 2018-12-19: 50 mg via ORAL

## 2018-12-19 MED ORDER — ACETAMINOPHEN 325 MG PO TABS
650.0000 mg | ORAL_TABLET | Freq: Once | ORAL | Status: AC
  Administered 2018-12-19: 650 mg via ORAL

## 2018-12-19 MED ORDER — ACETAMINOPHEN 325 MG PO TABS
ORAL_TABLET | ORAL | Status: AC
  Filled 2018-12-19: qty 2

## 2018-12-19 MED ORDER — MONTELUKAST SODIUM 10 MG PO TABS
10.0000 mg | ORAL_TABLET | Freq: Every day | ORAL | Status: DC
  Administered 2018-12-19: 10 mg via ORAL

## 2018-12-19 MED ORDER — MONTELUKAST SODIUM 10 MG PO TABS
ORAL_TABLET | ORAL | Status: AC
  Filled 2018-12-19: qty 1

## 2018-12-19 MED ORDER — SODIUM CHLORIDE 0.9 % IV SOLN
15.6000 mg/kg | Freq: Once | INTRAVENOUS | Status: AC
  Administered 2018-12-19: 1700 mg via INTRAVENOUS
  Filled 2018-12-19: qty 80

## 2018-12-19 MED ORDER — FAMOTIDINE IN NACL 20-0.9 MG/50ML-% IV SOLN: 20.0000 mg | Freq: Once | INTRAVENOUS | Status: DC

## 2018-12-19 MED ORDER — PROCHLORPERAZINE MALEATE 10 MG PO TABS: 10.0000 mg | ORAL_TABLET | Freq: Once | ORAL | Status: DC

## 2018-12-19 MED ORDER — PROCHLORPERAZINE MALEATE 10 MG PO TABS
ORAL_TABLET | ORAL | Status: AC
  Filled 2018-12-19: qty 1

## 2018-12-19 NOTE — Patient Instructions (Signed)
[facility] Discharge Instructions for Patients Receiving Chemotherapy  Today you received the following chemotherapy agents: Daratumumab (Darzalex), Carfilomib (Kyprolis)  To help prevent nausea and vomiting after your treatment, we encourage you to take your nausea medication as directed.    If you develop nausea and vomiting that is not controlled by your nausea medication, call the clinic.   BELOW ARE SYMPTOMS THAT SHOULD BE REPORTED IMMEDIATELY:  *FEVER GREATER THAN 100.5 F  *CHILLS WITH OR WITHOUT FEVER  NAUSEA AND VOMITING THAT IS NOT CONTROLLED WITH YOUR NAUSEA MEDICATION  *UNUSUAL SHORTNESS OF BREATH  *UNUSUAL BRUISING OR BLEEDING  TENDERNESS IN MOUTH AND THROAT WITH OR WITHOUT PRESENCE OF ULCERS  *URINARY PROBLEMS  *BOWEL PROBLEMS  UNUSUAL RASH Items with * indicate a potential emergency and should be followed up as soon as possible.  Feel free to call the clinic should you have any questions or concerns. The clinic phone number is [phone].  Please show the [place] at check-in to the Emergency Department and triage nurse.  Carfilzomib injection What is this medicine? CARFILZOMIB (kar FILZ oh mib) targets a specific protein within cancer cells and stops the cancer cells from growing. It is used to treat multiple myeloma. This medicine may be used for other purposes; ask your health care provider or pharmacist if you have questions. COMMON BRAND NAME(S): KYPROLIS What should I tell my health care provider before I take this medicine? They need to know if you have any of these conditions: -heart disease -history of blood clots -irregular heartbeat -kidney disease -liver disease -lung or breathing disease -an unusual or allergic reaction to carfilzomib, or other medicines, foods, dyes, or preservatives -pregnant or trying to get pregnant -breast-feeding How should I use this medicine? This medicine is for injection or infusion  into a vein. It is given by a health care professional in a hospital or clinic setting. Talk to your pediatrician regarding the use of this medicine in children. Special care may be needed. Overdosage: If you think you have taken too much of this medicine contact a poison control center or emergency room at once. NOTE: This medicine is only for you. Do not share this medicine with others. What if I miss a dose? It is important not to miss your dose. Call your doctor or health care professional if you are unable to keep an appointment. What may interact with this medicine? Interactions are not expected. Give your health care provider a list of all the medicines, herbs, non-prescription drugs, or dietary supplements you use. Also tell them if you smoke, drink alcohol, or use illegal drugs. Some items may interact with your medicine. This list may not describe all possible interactions. Give your health care provider a list of all the medicines, herbs, non-prescription drugs, or dietary supplements you use. Also tell them if you smoke, drink alcohol, or use illegal drugs. Some items may interact with your medicine. What should I watch for while using this medicine? Your condition will be monitored carefully while you are receiving this medicine. Report any side effects. Continue your course of treatment even though you feel ill unless your doctor tells you to stop. You may need blood work done while you are taking this medicine. Do not become pregnant while taking this medicine or for at least 6 months after stopping it. Women should inform their doctor if they wish to become pregnant or think they might be pregnant. There is a potential for serious side effects  to an unborn child. Men should not father a child while taking this medicine and for at least 3 months after stopping it. Talk to your health care professional or pharmacist for more information. Do not breast-feed an infant while taking this  medicine or for 2 weeks after the last dose. Check with your doctor or health care professional if you get an attack of severe diarrhea, nausea and vomiting, or if you sweat a lot. The loss of too much body fluid can make it dangerous for you to take this medicine. You may get dizzy. Do not drive, use machinery, or do anything that needs mental alertness until you know how this medicine affects you. Do not stand or sit up quickly, especially if you are an older patient. This reduces the risk of dizzy or fainting spells. What side effects may I notice from receiving this medicine? Side effects that you should report to your doctor or health care professional as soon as possible: -allergic reactions like skin rash, itching or hives, swelling of the face, lips, or tongue -confusion -dizziness -feeling faint or lightheaded -fever or chills -palpitations -seizures -signs and symptoms of bleeding such as bloody or black, tarry stools; red or dark-brown urine; spitting up blood or brown material that looks like coffee grounds; red spots on the skin; unusual bruising or bleeding including from the eye, gums, or nose -signs and symptoms of a blood clot such as breathing problems; changes in vision; chest pain; severe, sudden headache; pain, swelling, warmth in the leg; trouble speaking; sudden numbness or weakness of the face, arm or leg -signs and symptoms of kidney injury like trouble passing urine or change in the amount of urine -signs and symptoms of liver injury like dark yellow or brown urine; general ill feeling or flu-like symptoms; light-colored stools; loss of appetite; nausea; right upper belly pain; unusually weak or tired; yellowing of the eyes or skin Side effects that usually do not require medical attention (report to your doctor or health care professional if they continue or are bothersome): -back pain -cough -diarrhea -headache -muscle cramps -vomiting This list may not describe all  possible side effects. Call your doctor for medical advice about side effects. You may report side effects to FDA at 1-800-FDA-1088. Where should I keep my medicine? This drug is given in a hospital or clinic and will not be stored at home. NOTE: This sheet is a summary. It may not cover all possible information. If you have questions about this medicine, talk to your doctor, pharmacist, or health care provider.  2019 Elsevier/Gold Standard (2017-07-19 14:07:13)

## 2018-12-20 ENCOUNTER — Ambulatory Visit: Payer: Medicare Other | Admitting: Physical Therapy

## 2018-12-20 ENCOUNTER — Encounter: Payer: Self-pay | Admitting: Physical Therapy

## 2018-12-20 ENCOUNTER — Other Ambulatory Visit: Payer: Self-pay

## 2018-12-20 DIAGNOSIS — M546 Pain in thoracic spine: Secondary | ICD-10-CM

## 2018-12-20 DIAGNOSIS — M25511 Pain in right shoulder: Secondary | ICD-10-CM

## 2018-12-20 DIAGNOSIS — R29898 Other symptoms and signs involving the musculoskeletal system: Secondary | ICD-10-CM | POA: Diagnosis not present

## 2018-12-20 DIAGNOSIS — M25512 Pain in left shoulder: Secondary | ICD-10-CM | POA: Diagnosis not present

## 2018-12-20 DIAGNOSIS — M6281 Muscle weakness (generalized): Secondary | ICD-10-CM | POA: Diagnosis not present

## 2018-12-20 NOTE — Patient Instructions (Signed)
Access Code: [email or web address]  URL: [email or web address]  Date: 12/20/2018  Prepared by: Manus Gunning   Exercises  Standing Low Shoulder Row with Anchored Resistance - 10 reps - 1 sets - 1x daily - 7x weekly  Shoulder extension with resistance - Neutral - 10 reps - 1 sets - 1x daily - 7x weekly  Proper Sit to Stand Technique - 10 reps - 1 sets - 1x daily - 7x weekly  Standing Shoulder External Rotation with Resistance - 10 reps - 1 sets - 1x daily - 7x weekly

## 2018-12-20 NOTE — Therapy (Signed)
[place], [place], [address] Phone: [phone]   Fax:  [phone]  Physical Therapy Treatment  Patient Details  Name: Michael [name] MRN: [id number] Date of Birth: 1951/07/09 Referring Provider (PT): Dr. Irene Limbo    Encounter Date: 12/20/2018  PT End of Session - 12/20/18 1656    Visit Number  4    Number of Visits  9    Date for PT Re-Evaluation  01/04/19    PT Start Time  1316   pt arrived late   PT Stop Time  1347    PT Time Calculation (min)  31 min    Activity Tolerance  Patient limited by pain    Behavior During Therapy  [facility] for tasks assessed/performed       Past Medical History:  Diagnosis Date  . Angioedema   . CPAP (continuous positive airway pressure) dependence    uses at night  . Gastroesophageal reflux disease without esophagitis   . History of radiation therapy 06/04/18- 06/15/18   25 Gy in 10 fractions to the laryngeal/ cartilage lesion  . Hyperlipidemia   . Multiple myeloma ([place]) 2012   Treated at [facility]  . Non morbid obesity due to excess calories   . Prediabetes   . Prediabetes   . Unspecified glaucoma(365.9)   . Urticaria, unspecified     Past Surgical History:  Procedure Laterality Date  . IR RADIOLOGIST EVAL & MGMT  11/13/2018  . PORTACATH PLACEMENT    . PORTACATH PLACEMENT     removed 02/2016    There were no vitals filed for this visit.  Subjective Assessment - 12/20/18 1318    Subjective  I think the ionto patch helped a little. The bed mobility is not too bad. We are debating on going shopping for a new mattress.     Pertinent History  diagnosed in April 2012 in [place]. and was in a clinical trial at [place] and had functional independence at that time. In 2016, he retired from [employer] and moved to [facility] and under the care of Dr. Irene Limbo.  He again began treatment for multiple myeloma. He joined another clinical trial in Nov. and switched back to [facility] for treatment, But  the cancer had progressed  by Jan 2020 possibly from clinical trial drug.  That was put on hold and he is back under care of Dr. Irene Limbo.  Before Dec. he had pretty good funcitoning     Patient Stated Goals  Pt wants to know what he is allowed to do and what he is not allowed to do in relation to the cancer?    Currently in Pain?  Yes    Pain Score  3     Pain Location  Back    Multiple Pain Sites  Yes    Pain Score  3    Pain Location  Shoulder    Pain Orientation  Right                       [facility] Adult PT Treatment/Exercise - 12/20/18 0001      Exercises   Exercises  Shoulder      Knee/Hip Exercises: Seated   Sit to Sand  5 reps;with UE support;Other (comment)   using 1 UE, pt required cueing for correct technique     Shoulder Exercises: Seated   Extension  Strengthening;Both;10 reps    Theraband Level (Shoulder Extension)  Level 2 (Red)  Retraction  Strengthening;Both;10 reps    Theraband Level (Shoulder Retraction)  Level 2 (Red)    External Rotation  Strengthening;Both;10 reps    Theraband Level (Shoulder External Rotation)  Level 2 (Red)      Iontophoresis   Type of Iontophoresis  Dexamethasone    Location  Rt upper arm at insertion of rotator cuff muscles    Dose  4 mg/mL    Time  4-6 hr wear (to remove before 230)                  PT Long Term Goals - 12/06/18 1707      PT LONG TERM GOAL #1   Title  Pt will have painfree elevation of both shoulders to 115 degress so that he can perform his activities of daily life easeier     Time  4    Period  Weeks    Status  New      PT LONG TERM GOAL #2   Title  Pt will have a HEP for isometrics and trunk extension exercises so that he can maintain and improve strength at home     Time  4    Period  Weeks    Status  New      PT LONG TERM GOAL #3   Title  Pt will decrease his Normal TUG time to < 12 seconds indicating a functional balance and mobility improvment     Baseline  15.08    Time  4     Period  Weeks    Status  New            Plan - 12/20/18 1337    Clinical Impression Statement  Pt arrived late today because he thought his appointment was at 1:15. Pt has a lot of pain which limits how much he is able to do in therapy. Spent time educating pt on proper sit to stand technique. Pt has been leaning over towards left and pushing up with cane. He has left hip flexor pain most likely due to tightness. Educated pt today to stand without using cane and leaning to side. Issued HEP today with UE strengthening exercises and sit to stands for pt to start doing at home. Pt felt ionto was helping so reapplied this today to right shoulder.     Rehab Potential  Good    Clinical Impairments Affecting Rehab Potential  acute compression fracture of T12 , recent cortisone shot in left shoulder     PT Frequency  2x / week    PT Duration  4 weeks    PT Treatment/Interventions  ADLs/Self Care Home Management;Moist Heat;Iontophoresis '4mg'$ /ml Dexamethasone;DME Instruction;Stair training;Functional mobility training;Neuromuscular re-education;Therapeutic exercise;Therapeutic activities;Patient/family education    PT Next Visit Plan  Assess and continue ionto for right shoulder, cont to review precautions for compression fracture: avoid spinal flexion and rotation and see if he tried new bed mobility techniques and if they are helping, assess indep with Meeks Instruct in HEP for scapular retraction, low rows, wall taps and glute sets, gentle shoulder ROM, sit to stand from raised table and progression to AROM as able withing pain limits, consider use of TENS    PT Home Exercise Plan  Access Code: [email or web address]     Consulted and Agree with Plan of Care  Patient       Patient will benefit from skilled therapeutic intervention in order to improve the following deficits and impairments:  Abnormal gait, Pain, Postural dysfunction, Decreased mobility,  Decreased activity tolerance, Decreased endurance, Decreased  range of motion, Decreased strength, Impaired perceived functional ability, Impaired UE functional use, Obesity, Difficulty walking, Decreased balance, Decreased knowledge of precautions  Visit Diagnosis: Pain in thoracic spine  Acute pain of right shoulder  Muscle weakness (generalized)     Problem List Patient Active Problem List   Diagnosis Date Noted  . Counseling regarding advance care planning and goals of care 10/28/2018  . Multiple myeloma ([place]) 03/28/2017    Allyson Sabal [facility] 12/20/2018, 5:00 PM  [place], [place], [address] Phone: [phone]   Fax:  [phone]  Name: Michael [name] MRN: [id number] Date of Birth: 01-Aug-1951  Manus Gunning, PT 12/20/18 5:00 PM

## 2018-12-24 ENCOUNTER — Other Ambulatory Visit: Payer: Self-pay

## 2018-12-24 ENCOUNTER — Encounter: Payer: Self-pay | Admitting: Physical Therapy

## 2018-12-24 ENCOUNTER — Ambulatory Visit: Payer: Medicare Other | Admitting: Physical Therapy

## 2018-12-24 DIAGNOSIS — M546 Pain in thoracic spine: Secondary | ICD-10-CM

## 2018-12-24 DIAGNOSIS — M6281 Muscle weakness (generalized): Secondary | ICD-10-CM

## 2018-12-24 DIAGNOSIS — R29898 Other symptoms and signs involving the musculoskeletal system: Secondary | ICD-10-CM | POA: Diagnosis not present

## 2018-12-24 DIAGNOSIS — M25511 Pain in right shoulder: Secondary | ICD-10-CM | POA: Diagnosis not present

## 2018-12-24 DIAGNOSIS — M25512 Pain in left shoulder: Secondary | ICD-10-CM | POA: Diagnosis not present

## 2018-12-24 NOTE — Therapy (Signed)
[place], [place], [address] Phone: [phone]   Fax:  [phone]  Physical Therapy Treatment  Patient Details  Name: Michael [name] MRN: [id number] Date of Birth: Jun 09, 1951 Referring Provider (PT): Dr. Irene Limbo    Encounter Date: 12/24/2018  PT End of Session - 12/24/18 1344    Visit Number  5    Number of Visits  9    Date for PT Re-Evaluation  01/04/19    PT Start Time  6811    PT Stop Time  1345    PT Time Calculation (min)  42 min    Activity Tolerance  Patient limited by pain    Behavior During Therapy  [facility] for tasks assessed/performed       Past Medical History:  Diagnosis Date  . Angioedema   . CPAP (continuous positive airway pressure) dependence    uses at night  . Gastroesophageal reflux disease without esophagitis   . History of radiation therapy 06/04/18- 06/15/18   25 Gy in 10 fractions to the laryngeal/ cartilage lesion  . Hyperlipidemia   . Multiple myeloma ([place]) 2012   Treated at [facility]  . Non morbid obesity due to excess calories   . Prediabetes   . Prediabetes   . Unspecified glaucoma(365.9)   . Urticaria, unspecified     Past Surgical History:  Procedure Laterality Date  . IR RADIOLOGIST EVAL & MGMT  11/13/2018  . PORTACATH PLACEMENT    . PORTACATH PLACEMENT     removed 02/2016    There were no vitals filed for this visit.  Subjective Assessment - 12/24/18 1304    Subjective  I saw my shoulder doctor today and he said if I am having pain with my exercises to stop doing them. I think the ionto patch is working. Im not able to do the exercises as much as I like because of the cancer. If I am feeling bad I just lie down.     Pertinent History  diagnosed in April 2012 in [place]. and was in a clinical trial at [place] [facility] and had functional independence at that time. In 2016, he retired from [employer] and moved to [facility] and under the care of Dr. Irene Limbo.  He again began treatment for  multiple myeloma. He joined another clinical trial in Nov. and switched back to [facility] for treatment, But the cancer had progressed  by Jan 2020 possibly from clinical trial drug.  That was put on hold and he is back under care of Dr. Irene Limbo.  Before Dec. he had pretty good funcitoning     Patient Stated Goals  Pt wants to know what he is allowed to do and what he is not allowed to do in relation to the cancer?    Currently in Pain?  Yes    Pain Score  3     Pain Location  Back    Pain Orientation  Lower    Pain Score  5    Pain Location  Shoulder    Pain Orientation  Right    Pain Descriptors / Indicators  --   grabbing pain                      [facility] Adult PT Treatment/Exercise - 12/24/18 0001      Lumbar Exercises: Supine   Glut Set  5 seconds;10 reps   pt required moderate v/c to perform correctly   Bridge  Limitations  Bridge Limitations  pt unable       Knee/Hip Exercises: Seated   Sit to Sand  5 reps;with UE support;Other (comment)   using 1 UE, pt required cueing for correct technique     Knee/Hip Exercises: Supine   Quad Sets  Strengthening;Both;1 set;10 reps   5 sec holds, pt required min v/c    Short Arc Quad Sets  Strengthening;Both;1 set;10 reps   5 sec holds with 2 lb ankle weights and v/c to control desce   Other Supine Knee/Hip Exercises  hip abduction with green band around knees x 20 reps with v/c to move slow and controlled      Iontophoresis   Type of Iontophoresis  Dexamethasone    Location  Rt upper arm at insertion of rotator cuff muscles    Dose  4 mg/mL    Time  4-6 hr wear (to remove before 230)                  PT Long Term Goals - 12/06/18 1707      PT LONG TERM GOAL #1   Title  Pt will have painfree elevation of both shoulders to 115 degress so that he can perform his activities of daily life easeier     Time  4    Period  Weeks    Status  New      PT LONG TERM GOAL #2   Title  Pt will have a HEP for isometrics and  trunk extension exercises so that he can maintain and improve strength at home     Time  4    Period  Weeks    Status  New      PT LONG TERM GOAL #3   Title  Pt will decrease his Normal TUG time to < 12 seconds indicating a functional balance and mobility improvment     Baseline  15.08    Time  4    Period  Weeks    Status  New            Plan - 12/24/18 1332    Clinical Impression Statement  Added strengthening exercises today for LEs in supine. Pt is limited in therapy by pain and requires extra time to change positions and recovery periods in between exercises due to back pain. Pt continues to require v/c for correct sit to stand technique.     Rehab Potential  Good    Clinical Impairments Affecting Rehab Potential  acute compression fracture of T12 , recent cortisone shot in left shoulder     PT Frequency  2x / week    PT Duration  4 weeks    PT Treatment/Interventions  ADLs/Self Care Home Management;Moist Heat;Iontophoresis 70m/ml Dexamethasone;DME Instruction;Stair training;Functional mobility training;Neuromuscular re-education;Therapeutic exercise;Therapeutic activities;Patient/family education    PT Next Visit Plan  continue ionto for right shoulder, cont to review precautions for compression fracture: avoid spinal flexion and rotation and see if he tried new bed mobility techniques and if they are helping, assess indep with Meeks Instruct in HEP for scapular retraction, low rows, wall taps and glute sets, gentle shoulder ROM, sit to stand from raised table and progression to AROM as able withing pain limits, consider use of TENS    PT Home Exercise Plan  Access Code: [email or web address]     Consulted and Agree with Plan of Care  Patient       Patient will benefit from skilled therapeutic intervention in order to improve the  following deficits and impairments:  Abnormal gait, Pain, Postural dysfunction, Decreased mobility, Decreased activity tolerance, Decreased endurance, Decreased  range of motion, Decreased strength, Impaired perceived functional ability, Impaired UE functional use, Obesity, Difficulty walking, Decreased balance, Decreased knowledge of precautions  Visit Diagnosis: Pain in thoracic spine  Muscle weakness (generalized)     Problem List Patient Active Problem List   Diagnosis Date Noted  . Counseling regarding advance care planning and goals of care 10/28/2018  . Multiple myeloma ([place]) 03/28/2017    Allyson Sabal [facility] 12/24/2018, 1:49 PM  [place], [place], [address] Phone: [phone]   Fax:  [phone]  Name: Masato Pettie MRN: [id number] Date of Birth: 1951/03/25  Manus Gunning, PT 12/24/18 1:49 PM

## 2018-12-25 ENCOUNTER — Inpatient Hospital Stay: Payer: Medicare Other

## 2018-12-25 DIAGNOSIS — C9002 Multiple myeloma in relapse: Secondary | ICD-10-CM

## 2018-12-25 DIAGNOSIS — E119 Type 2 diabetes mellitus without complications: Secondary | ICD-10-CM | POA: Diagnosis not present

## 2018-12-25 DIAGNOSIS — K219 Gastro-esophageal reflux disease without esophagitis: Secondary | ICD-10-CM | POA: Diagnosis not present

## 2018-12-25 DIAGNOSIS — E785 Hyperlipidemia, unspecified: Secondary | ICD-10-CM | POA: Diagnosis not present

## 2018-12-25 DIAGNOSIS — Z9221 Personal history of antineoplastic chemotherapy: Secondary | ICD-10-CM | POA: Diagnosis not present

## 2018-12-25 DIAGNOSIS — Z5112 Encounter for antineoplastic immunotherapy: Secondary | ICD-10-CM | POA: Diagnosis not present

## 2018-12-25 LAB — CBC WITH DIFFERENTIAL/PLATELET
Abs Immature Granulocytes: 0.02 10*3/uL (ref 0.00–0.07)
Basophils Absolute: 0 10*3/uL (ref 0.0–0.1)
Basophils Relative: 0 %
Eosinophils Absolute: 0.1 10*3/uL (ref 0.0–0.5)
Eosinophils Relative: 2 %
HCT: 30.7 % — ABNORMAL LOW (ref 39.0–52.0)
Hemoglobin: 10.2 g/dL — ABNORMAL LOW (ref 13.0–17.0)
IMMATURE GRANULOCYTES: 1 %
Lymphocytes Relative: 16 %
Lymphs Abs: 0.6 10*3/uL — ABNORMAL LOW (ref 0.7–4.0)
MCH: 32.7 pg (ref 26.0–34.0)
MCHC: 33.2 g/dL (ref 30.0–36.0)
MCV: 98.4 fL (ref 80.0–100.0)
Monocytes Absolute: 0.4 10*3/uL (ref 0.1–1.0)
Monocytes Relative: 10 %
NEUTROS PCT: 71 %
Neutro Abs: 2.6 10*3/uL (ref 1.7–7.7)
PLATELETS: 161 10*3/uL (ref 150–400)
RBC: 3.12 MIL/uL — ABNORMAL LOW (ref 4.22–5.81)
RDW: 15.5 % (ref 11.5–15.5)
WBC: 3.6 10*3/uL — ABNORMAL LOW (ref 4.0–10.5)
nRBC: 0 % (ref 0.0–0.2)

## 2018-12-25 LAB — CMP (CANCER CENTER ONLY)
ALBUMIN: 3.2 g/dL — AB (ref 3.5–5.0)
ALT: 12 U/L (ref 0–44)
ANION GAP: 7 (ref 5–15)
AST: 11 U/L — ABNORMAL LOW (ref 15–41)
Alkaline Phosphatase: 118 U/L (ref 38–126)
BUN: 7 mg/dL — ABNORMAL LOW (ref 8–23)
CO2: 27 mmol/L (ref 22–32)
Calcium: 8.4 mg/dL — ABNORMAL LOW (ref 8.9–10.3)
Chloride: 105 mmol/L (ref 98–111)
Creatinine: 0.75 mg/dL (ref 0.61–1.24)
GFR, Est AFR Am: >60 mL/min (ref >60)
GFR, Estimated: >60 mL/min (ref >60)
GLUCOSE: 103 mg/dL — AB (ref 70–99)
Potassium: 3.8 mmol/L (ref 3.5–5.1)
Sodium: 139 mmol/L (ref 135–145)
Total Bilirubin: 1 mg/dL (ref 0.3–1.2)
Total Protein: 6.8 g/dL (ref 6.5–8.1)

## 2018-12-26 ENCOUNTER — Other Ambulatory Visit: Payer: Self-pay

## 2018-12-26 ENCOUNTER — Other Ambulatory Visit: Payer: Medicare Other

## 2018-12-26 ENCOUNTER — Inpatient Hospital Stay: Payer: Medicare Other

## 2018-12-26 ENCOUNTER — Other Ambulatory Visit: Payer: Self-pay | Admitting: Hematology

## 2018-12-26 VITALS — BP 127/73 | HR 73 | Temp 99.0°F | Resp 17

## 2018-12-26 DIAGNOSIS — E119 Type 2 diabetes mellitus without complications: Secondary | ICD-10-CM | POA: Diagnosis not present

## 2018-12-26 DIAGNOSIS — C9002 Multiple myeloma in relapse: Secondary | ICD-10-CM | POA: Diagnosis not present

## 2018-12-26 DIAGNOSIS — Z5112 Encounter for antineoplastic immunotherapy: Secondary | ICD-10-CM | POA: Diagnosis not present

## 2018-12-26 DIAGNOSIS — K219 Gastro-esophageal reflux disease without esophagitis: Secondary | ICD-10-CM | POA: Diagnosis not present

## 2018-12-26 DIAGNOSIS — C9000 Multiple myeloma not having achieved remission: Secondary | ICD-10-CM

## 2018-12-26 DIAGNOSIS — E785 Hyperlipidemia, unspecified: Secondary | ICD-10-CM | POA: Diagnosis not present

## 2018-12-26 DIAGNOSIS — Z9221 Personal history of antineoplastic chemotherapy: Secondary | ICD-10-CM | POA: Diagnosis not present

## 2018-12-26 DIAGNOSIS — Z7189 Other specified counseling: Secondary | ICD-10-CM

## 2018-12-26 LAB — MULTIPLE MYELOMA PANEL, SERUM
Albumin SerPl Elph-Mcnc: 3.3 g/dL (ref 2.9–4.4)
Albumin/Glob SerPl: 1.1 (ref 0.7–1.7)
Alpha 1: 0.3 g/dL (ref 0.0–0.4)
Alpha2 Glob SerPl Elph-Mcnc: 0.8 g/dL (ref 0.4–1.0)
B-Globulin SerPl Elph-Mcnc: 0.7 g/dL (ref 0.7–1.3)
Gamma Glob SerPl Elph-Mcnc: 1.3 g/dL (ref 0.4–1.8)
Globulin, Total: 3.1 g/dL (ref 2.2–3.9)
IGG (IMMUNOGLOBIN G), SERUM: 1558 mg/dL (ref 700–1600)
IgA: 11 mg/dL — ABNORMAL LOW (ref 61–437)
IgM (Immunoglobulin M), Srm: 10 mg/dL — ABNORMAL LOW (ref 20–172)
M Protein SerPl Elph-Mcnc: 1.2 g/dL — ABNORMAL HIGH
TOTAL PROTEIN ELP: 6.4 g/dL (ref 6.0–8.5)

## 2018-12-26 MED ORDER — MONTELUKAST SODIUM 10 MG PO TABS
ORAL_TABLET | ORAL | Status: AC
  Filled 2018-12-26: qty 1

## 2018-12-26 MED ORDER — SODIUM CHLORIDE 0.9 % IV SOLN
20.0000 mg | Freq: Once | INTRAVENOUS | Status: AC
  Administered 2018-12-26: 20 mg via INTRAVENOUS
  Filled 2018-12-26: qty 2

## 2018-12-26 MED ORDER — PROCHLORPERAZINE MALEATE 10 MG PO TABS
10.0000 mg | ORAL_TABLET | Freq: Once | ORAL | Status: AC
  Administered 2018-12-26: 10 mg via ORAL

## 2018-12-26 MED ORDER — ACETAMINOPHEN 325 MG PO TABS
ORAL_TABLET | ORAL | Status: AC
  Filled 2018-12-26: qty 2

## 2018-12-26 MED ORDER — DIPHENHYDRAMINE HCL 25 MG PO CAPS
ORAL_CAPSULE | ORAL | Status: AC
  Filled 2018-12-26: qty 2

## 2018-12-26 MED ORDER — PROCHLORPERAZINE MALEATE 10 MG PO TABS
ORAL_TABLET | ORAL | Status: AC
  Filled 2018-12-26: qty 1

## 2018-12-26 MED ORDER — METHYLPREDNISOLONE SODIUM SUCC 125 MG IJ SOLR
INTRAMUSCULAR | Status: AC
  Filled 2018-12-26: qty 2

## 2018-12-26 MED ORDER — SODIUM CHLORIDE 0.9 % IV SOLN
INTRAVENOUS | Status: DC
  Administered 2018-12-26: 08:00:00 via INTRAVENOUS
  Filled 2018-12-26: qty 250

## 2018-12-26 MED ORDER — DIPHENHYDRAMINE HCL 25 MG PO CAPS
50.0000 mg | ORAL_CAPSULE | Freq: Once | ORAL | Status: AC
  Administered 2018-12-26: 50 mg via ORAL

## 2018-12-26 MED ORDER — FAMOTIDINE IN NACL 20-0.9 MG/50ML-% IV SOLN: 20.0000 mg | Freq: Once | INTRAVENOUS | Status: DC

## 2018-12-26 MED ORDER — ACETAMINOPHEN 325 MG PO TABS
650.0000 mg | ORAL_TABLET | Freq: Once | ORAL | Status: AC
  Administered 2018-12-26: 650 mg via ORAL

## 2018-12-26 MED ORDER — METHYLPREDNISOLONE SODIUM SUCC 125 MG IJ SOLR
100.0000 mg | Freq: Once | INTRAMUSCULAR | Status: AC
  Administered 2018-12-26: 100 mg via INTRAVENOUS

## 2018-12-26 MED ORDER — MONTELUKAST SODIUM 10 MG PO TABS
10.0000 mg | ORAL_TABLET | Freq: Every day | ORAL | Status: DC
  Administered 2018-12-26: 10 mg via ORAL

## 2018-12-26 MED ORDER — SODIUM CHLORIDE 0.9 % IV SOLN
15.6000 mg/kg | Freq: Once | INTRAVENOUS | Status: AC
  Administered 2018-12-26: 1700 mg via INTRAVENOUS
  Filled 2018-12-26: qty 80

## 2018-12-26 NOTE — Patient Instructions (Signed)
[facility] Discharge Instructions for Patients Receiving Chemotherapy  Today you received the following chemotherapy agents: Darzalex  To help prevent nausea and vomiting after your treatment, we encourage you to take your nausea medication as directed.    If you develop nausea and vomiting that is not controlled by your nausea medication, call the clinic.   BELOW ARE SYMPTOMS THAT SHOULD BE REPORTED IMMEDIATELY:  *FEVER GREATER THAN 100.5 F  *CHILLS WITH OR WITHOUT FEVER  NAUSEA AND VOMITING THAT IS NOT CONTROLLED WITH YOUR NAUSEA MEDICATION  *UNUSUAL SHORTNESS OF BREATH  *UNUSUAL BRUISING OR BLEEDING  TENDERNESS IN MOUTH AND THROAT WITH OR WITHOUT PRESENCE OF ULCERS  *URINARY PROBLEMS  *BOWEL PROBLEMS  UNUSUAL RASH Items with * indicate a potential emergency and should be followed up as soon as possible.  Feel free to call the clinic should you have any questions or concerns. The clinic phone number is [phone].  Please show the CHEMO ALERT CARD at check-in to the Emergency Department and triage nurse.   

## 2018-12-26 NOTE — Progress Notes (Signed)
Patient stated that he is having increased SOB and unable to take a deep breath. Message sent to Dr. Irene Limbo and desk nurse to make aware. Per Dr. Irene Limbo the patient has bone lesions that is causing the pain and SOB and he has been encouraging the patient to use his pain medication more effectively to manage pain and SOB. This RN discussed with the patient and he verbalized understanding.

## 2018-12-27 ENCOUNTER — Encounter: Payer: Self-pay | Admitting: Physical Therapy

## 2018-12-27 ENCOUNTER — Ambulatory Visit: Payer: Medicare Other | Admitting: Physical Therapy

## 2018-12-27 ENCOUNTER — Other Ambulatory Visit: Payer: Self-pay

## 2018-12-27 DIAGNOSIS — M6281 Muscle weakness (generalized): Secondary | ICD-10-CM | POA: Diagnosis not present

## 2018-12-27 DIAGNOSIS — M25512 Pain in left shoulder: Secondary | ICD-10-CM | POA: Diagnosis not present

## 2018-12-27 DIAGNOSIS — M546 Pain in thoracic spine: Secondary | ICD-10-CM

## 2018-12-27 DIAGNOSIS — M25511 Pain in right shoulder: Secondary | ICD-10-CM

## 2018-12-27 DIAGNOSIS — R29898 Other symptoms and signs involving the musculoskeletal system: Secondary | ICD-10-CM | POA: Diagnosis not present

## 2018-12-27 NOTE — Therapy (Signed)
[place], [place], [address] Phone: [phone]   Fax:  [phone]  Physical Therapy Treatment  Patient Details  Name: Michael [name] MRN: [id number] Date of Birth: [birth year] Referring Provider (PT): Dr. Irene Limbo    Encounter Date: 12/27/2018  PT End of Session - 12/27/18 1351    Visit Number  6    Number of Visits  9    Date for PT Re-Evaluation  01/04/19    PT Start Time  1306    PT Stop Time  1346    PT Time Calculation (min)  40 min    Activity Tolerance  Patient tolerated treatment well    Behavior During Therapy  [facility] for tasks assessed/performed       Past Medical History:  Diagnosis Date  . Angioedema   . CPAP (continuous positive airway pressure) dependence    uses at night  . Gastroesophageal reflux disease without esophagitis   . History of radiation therapy 06/04/18- 06/15/18   25 Gy in 10 fractions to the laryngeal/ cartilage lesion  . Hyperlipidemia   . Multiple myeloma ([place]) 2012   Treated at [facility]  . Non morbid obesity due to excess calories   . Prediabetes   . Prediabetes   . Unspecified glaucoma(365.9)   . Urticaria, unspecified     Past Surgical History:  Procedure Laterality Date  . IR RADIOLOGIST EVAL & MGMT  11/13/2018  . PORTACATH PLACEMENT    . PORTACATH PLACEMENT     removed 02/2016    There were no vitals filed for this visit.  Subjective Assessment - 12/27/18 1311    Subjective  Pt states he had a teenager come over and help him with his yard work, he took his pain med and was able to sleep last night,  He said that he saw Dr. Percell Miller about his shoulders , did not get a shot in his shoulder but the option is there for him get one if he want to,     Pertinent History  diagnosed in April 2012 in [place]. and was in a clinical trial at [facility] and had functional independence at that time. In 2016, he retired from [employer] and moved to [facility] and under the care of Dr. Irene Limbo.   He again began treatment for multiple myeloma. He joined another clinical trial in Nov. and switched back to [facility] for treatment, But the cancer had progressed  by Jan 2020 possibly from clinical trial drug.  That was put on hold and he is back under care of Dr. Irene Limbo.  Before Dec. he had pretty good funcitoning     Patient Stated Goals  Pt wants to know what he is allowed to do and what he is not allowed to do in relation to the cancer?    Currently in Pain?  Yes    Pain Score  3     Pain Location  Shoulder    Pain Orientation  Right;Left    Pain Descriptors / Indicators  Sharp    Pain Type  Chronic pain    Pain Radiating Towards  also has pain in back                        [facility] Adult PT Treatment/Exercise - 12/27/18 0001      Exercises   Exercises  Shoulder;Lumbar;Knee/Hip      Lumbar Exercises: Standing   Other Standing Lumbar Exercises  began instruction  in hip hinge  with dowel along back for posture cues. ( pt not able to get head back to dowel       Knee/Hip Exercises: Standing   Other Standing Knee Exercises  staggered stance weight shift onto front foot with 5 sec hold x 10 reps with each leg forward     Other Standing Knee Exercises  sit to stand from high mat x 8 reps       Shoulder Exercises: Standing   Row  Strengthening;Right;Left;20 reps;Theraband    Theraband Level (Shoulder Row)  Level 2 (Red)    Row Limitations  with elbow straight and cues to keep core stable, 10 reps bilaterally, 10 reps alternating       Shoulder Exercises: Isometric Strengthening   Flexion  5X5"    Extension  5X5"    ABduction  5X5"                  PT Long Term Goals - 12/06/18 1707      PT LONG TERM GOAL #1   Title  Pt will have painfree elevation of both shoulders to 115 degress so that he can perform his activities of daily life easeier     Time  4    Period  Weeks    Status  New      PT LONG TERM GOAL #2   Title  Pt will have a HEP for isometrics and  trunk extension exercises so that he can maintain and improve strength at home     Time  4    Period  Weeks    Status  New      PT LONG TERM GOAL #3   Title  Pt will decrease his Normal TUG time to < 12 seconds indicating a functional balance and mobility improvment     Baseline  15.08    Time  4    Period  Weeks    Status  New            Plan - 12/27/18 1351    Clinical Impression Statement  Pt says he is getting benefit from PT and wants to continue.  He is taking a pain patch and medicine to keep his pain under control so does not want to use the ionto today. He was able to participate well with PT but continues to have difficutly with sit to stand especially from a low mat.     Clinical Presentation due to:  ongoing chemo     Clinical Impairments Affecting Rehab Potential  acute compression fracture of T12 , recent cortisone shot in left shoulder     PT Frequency  2x / week    PT Duration  4 weeks    PT Treatment/Interventions  ADLs/Self Care Home Management;Moist Heat;Iontophoresis 73m/ml Dexamethasone;DME Instruction;Stair training;Functional mobility training;Neuromuscular re-education;Therapeutic exercise;Therapeutic activities;Patient/family education    PT Next Visit Plan   cont to review precautions for compression fracture: avoid spinal flexion and rotation and see if he tried new bed mobility techniques and if they are helping, assess indep with Meeks Instruct in HEP for scapular retraction, low rows, wall taps and glute sets, gentle shoulder ROM, sit to stand from raised table and progression to AROM as able withing pain limits, consider use of TENS       Patient will benefit from skilled therapeutic intervention in order to improve the following deficits and impairments:  Abnormal gait, Pain, Postural dysfunction, Decreased mobility, Decreased activity tolerance, Decreased endurance, Decreased  range of motion, Decreased strength, Impaired perceived functional ability,  Impaired UE functional use, Obesity, Difficulty walking, Decreased balance, Decreased knowledge of precautions  Visit Diagnosis: Pain in thoracic spine  Muscle weakness (generalized)  Acute pain of right shoulder  Other symptoms and signs involving the musculoskeletal system     Problem List Patient Active Problem List   Diagnosis Date Noted  . Counseling regarding advance care planning and goals of care 10/28/2018  . Multiple myeloma ([place]) 03/28/2017   Donato Heinz. Owens Shark PT  [facility] 12/27/2018, 5:31 PM  [place], [place], [address] Phone: [phone]   Fax:  [phone]  Name: Omaree Fuqua MRN: [id number] Date of Birth: Mar 25, 1951

## 2018-12-27 NOTE — Patient Instructions (Signed)

## 2018-12-31 NOTE — Progress Notes (Signed)
[name]    HEMATOLOGY/ONCOLOGY CLINIC NOTE  Date of Service: 01/01/19     Patient Care Team: [name], MD as PCP - General (Family Medicine) [name], MD as Consulting Physician (Hematology) [name], MD as Attending Physician (Radiation Oncology) [name], RN as Oncology Nurse Navigator (Oncology) [name] MD ([facility] _ primary oncology) phone number [phone] Email- [email or web address]  CHIEF COMPLAINTS  followup of multiple myeloma.  HISTORY OF PRESENTING ILLNESS:   Michael [name] is a wonderful 68 y.o. 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 Patient is currently on a study protocol and is being monitored for myeloma recurrence.  Based on available records being put together  -Patient was diagnosed with IgG kappa ISS stage I multiple myeloma with hyperdiploid complex cytogenetics in 2012. Bone marrow biopsy apparently showed 50% kappa restricted plasma cells with an initial M spike of 3.4 g/dL with evidence of lytic lesions on his PET/CT scan including right rib fracture and lesions of the lumbar and cervical spine. -Patient was treated under protocol 11-C-0221 with from 02/21/2013 with  Carfilzomib/Revlimid/Dexamethasone for a total of 8 cycles. He reports that his stem cells were collected and stored after 4 cycles -Posttreatment bone marrow biopsy showed less than 5% plasma cells with a negative flow cytometry and improvement in his previously PET avid lesions. Some concern for new focus of activity at C2 lytic lesion at L4 and several small lesions throughout the vertebrae. -Patient was on maintenance lenalidomide 10 mg by  mouth daily for 2 years or more until end of 2014. 08/19/2013 - bone marrow biopsy showed normocellular marrow with less than 5% plasma cells. PET CT scan showed decreased focal metabolic ligament prior rib lesions and no new lesions. Flow cytometry was negative for residual disease. Serum and urine electrophoresis and IFE showed no evidence of monoclonal protein. -Patient notes that he was off protocol for a few years due to lack of clinical trial research funding. -Patient notes that he is back on a follow-up protocol and continues to follow and receive his primary treatment at [facility] at this time. -Patient reports he had his last PET CT scan blood and urine tests and bone marrow examination in February 2018. The results of which are not available to [place] currently.  He notes that there was concern for a very slowly growing sternal FDG avid lesion that has been present for years. He has a follow-up at [facility] next month to determine the management of this lesion. He reports that was some consideration of considering radiation. he notes that this lesion has not been biopsied. No other focal bone pains at this time.  We discussed in detail about what our role will be and noted that we shall be available for any acute issues that need to be taken care of locally but that at this time the primary treatment and evaluation will be driven by his team at [facility] as per their research protocols and input. The patient and his wife are clearly aware of this and are in agreement with this plan.  INTERVAL HISTORY  Michael [name] comes is here for continued management of his Multiple Myeloma prior to C3D1 treatment. The patient's last visit with [place]  was on 12/11/18. The pt reports that he is doing well overall.   The pt reports that he has continued to find PT helpful. The pt denies any problems tolerating C2 treatment which added Carfilzomib. The pt denies new bone pains. The pt notes that he has continued on  fentanyl patch and has needed to use his break through Oxycodone 1-2 times a day. The pt has continued using laxatives and notes that he is moving his bowels.  The pt's wife notes that he is not eating very well, and then begins "feeling sick." The pt notes that the smell of food is limiting, but he is trying to continue eating.  Lab results today (01/01/19) of CBC w/diff and CMP is as follows: all values are WNL except for WBC at 3.6k, RBC at 2.99, HGB at 10.0, HCT at 30.1, MCV at 100.7, RDW at 16.0, Lymphs abs at 400, Glucose at 116, Calcium at 8.1, Albumin at 3.4, AST at 12.  On review of systems, pt reports stable energy levels, controlled back and rib pain, and denies new bone pains, concerns for infections, leg swelling, and any other symptoms.  MEDICAL HISTORY:  Past Medical History:  Diagnosis Date  . Angioedema   . CPAP (continuous positive airway pressure) dependence    uses at night  . Gastroesophageal reflux disease without esophagitis   . History of radiation therapy 06/04/18- 06/15/18   25 Gy in 10 fractions to the laryngeal/ cartilage lesion  . Hyperlipidemia   . Multiple myeloma ([place]) 2012   Treated at [facility]  . Non morbid obesity due to excess calories   . Prediabetes   . Prediabetes   . Unspecified glaucoma(365.9)   . Urticaria, unspecified   Diabetes mellitus Glaucoma Left toes neuropathy  GERD  SURGICAL HISTORY: Past Surgical History:  Procedure Laterality Date  . IR RADIOLOGIST EVAL & MGMT  11/13/2018  . PORTACATH PLACEMENT    . PORTACATH PLACEMENT     removed 02/2016    SOCIAL HISTORY: Social History   Socioeconomic History  . Marital status: Married    Spouse name: Not on file  . Number of children: 2  . Years of education: Not on file  . Highest education level: Not on file  Occupational History  . Occupation: Retired Chief Strategy Officer for [employer]  . Financial resource strain: Not on file  . Food insecurity:    Worry: Not on file     Inability: Not on file  . Transportation needs:    Medical: Not on file    Non-medical: Not on file  Tobacco Use  . Smoking status: Never Smoker  . Smokeless tobacco: Never Used  Substance and Sexual Activity  . Alcohol use: Yes    Alcohol/week: 0.0 standard drinks    Comment: Occasional beer  . Drug use: No  . Sexual activity: Not on file  Lifestyle  . Physical activity:    Days per week: Not on file    Minutes per session: Not on file  . Stress: Not on file  Relationships  . Social connections:    Talks on phone: Not on file    Gets together: Not on file    Attends religious service: Not on file    Active member of club or organization: Not on file    Attends meetings of clubs or organizations: Not on file    Relationship status: Not on file  . Intimate partner violence:    Fear of current or  ex partner: No    Emotionally abused: No    Physically abused: No    Forced sexual activity: No  Other Topics Concern  . Not on file  Social History Narrative   Unable to [employer] partner violence- partner in room   Never smoker Married Retired from the [employer] in June 2017 and moved to [place].  FAMILY HISTORY: Family History  Problem Relation Age of Onset  . Cancer Father     ALLERGIES:  has No Known Allergies.  MEDICATIONS:  Current Outpatient Medications  Medication Sig Dispense Refill  . acyclovir (ZOVIRAX) 400 MG tablet Take 1 tablet (400 mg total) by mouth 2 (two) times daily. 60 tablet 11  . aspirin 81 MG tablet Take 81 mg by mouth daily.    [name] atorvastatin (LIPITOR) 20 MG tablet Take 20 mg by mouth every evening.     [name] dexamethasone (DECADRON) 4 MG tablet 78m (5 tabs) with breakfast the day after each daratumumab treatment 20 tablet 4  . fentaNYL (DURAGESIC) 25 MCG/HR Place 1 patch onto the skin every 3 (three) days. 10 patch 0  . Lido-Capsaicin-Men-Methyl Sal (MEDI-PATCH-LIDOCAINE) 0.5-0.035-5-20 % PTCH Apply 1 patch topically 2 (two)  times daily as needed. 30 patch 0  . Multiple Vitamin (MULTIVITAMIN) tablet Take 1 tablet by mouth daily.    .[name] Kitchennystatin (MYCOSTATIN/NYSTOP) powder Apply 100,000 g topically daily as needed.    . ondansetron (ZOFRAN) 8 MG tablet Take 1 tablet (8 mg total) by mouth 2 (two) times daily as needed (Nausea or vomiting). 30 tablet 1  . oxyCODONE (OXY IR/ROXICODONE) 5 MG immediate release tablet Take 1-2 tablets (5-10 mg total) by mouth every 4 (four) hours as needed for severe pain or breakthrough pain. 60 tablet 0  . pantoprazole (PROTONIX) 40 MG tablet Take 40 mg by mouth daily.     . polyethylene glycol (MIRALAX) packet Take 17 g by mouth daily.    . prochlorperazine (COMPAZINE) 10 MG tablet Take 1 tablet (10 mg total) by mouth every 6 (six) hours as needed (Nausea or vomiting). 30 tablet 1  . senna-docusate (SENOKOT-S) 8.6-50 MG tablet Take 2 tablets by mouth at bedtime. Takes 50 mg 60 tablet 0   No current facility-administered medications for this visit.     REVIEW OF SYSTEMS:    A 10+ POINT REVIEW OF SYSTEMS WAS OBTAINED including neurology, dermatology, psychiatry, cardiac, respiratory, lymph, extremities, GI, GU, Musculoskeletal, constitutional, breasts, reproductive, HEENT.  All pertinent positives are noted in the HPI.  All others are negative.   PHYSICAL EXAMINATION: ECOG PERFORMANCE STATUS: 1 - Symptomatic but completely ambulatory  Vitals:   01/01/19 1007  BP: 128/74  Pulse: 75  Resp: 18  Temp: 98.8 F (37.1 C)  TempSrc: Oral  SpO2: 100%  Weight: 224 lb 1.6 oz (101.7 kg)  Height: 6' 1"  (1.854 m)   Body mass index is 29.57 kg/m.   GENERAL:alert, in no acute distress and comfortable SKIN: no acute rashes, no significant lesions EYES: conjunctiva are pink and non-injected, sclera anicteric OROPHARYNX: MMM, no exudates, no oropharyngeal erythema or ulceration NECK: supple, no JVD LYMPH:  no palpable lymphadenopathy in the cervical, axillary or inguinal regions LUNGS: clear  to auscultation b/l with normal respiratory effort HEART: regular rate & rhythm ABDOMEN:  normoactive bowel sounds , non tender, not distended. No palpable hepatosplenomegaly.  Extremity: no pedal edema PSYCH: alert & oriented x 3 with fluent speech NEURO: no focal motor/sensory deficits   LABORATORY DATA:  I have reviewed the data as listed  . CBC Latest Ref Rng & Units 01/01/2019 12/25/2018 12/18/2018  WBC 4.0 - 10.5 K/uL 3.6(L) 3.6(L) 4.5  Hemoglobin 13.0 - 17.0 g/dL 10.0(L) 10.2(L) 11.2(L)  Hematocrit 39.0 - 52.0 % 30.1(L) 30.7(L) 33.7(L)  Platelets 150 - 400 K/uL 207 161 208  ANC 1200  . CMP Latest Ref Rng & Units 01/01/2019 12/25/2018 12/18/2018  Glucose 70 - 99 mg/dL 116(H) 103(H) 132(H)  BUN 8 - 23 mg/dL 10 7(L) 10  Creatinine 0.61 - 1.24 mg/dL 0.79 0.75 0.85  Sodium 135 - 145 mmol/L 139 139 139  Potassium 3.5 - 5.1 mmol/L 3.8 3.8 4.1  Chloride 98 - 111 mmol/L 106 105 104  CO2 22 - 32 mmol/L 25 27 29   Calcium 8.9 - 10.3 mg/dL 8.1(L) 8.4(L) 9.0  Total Protein 6.5 - 8.1 g/dL 6.8 6.8 7.6  Total Bilirubin 0.3 - 1.2 mg/dL 1.1 1.0 0.8  Alkaline Phos 38 - 126 U/L 118 118 150(H)  AST 15 - 41 U/L 12(L) 11(L) 12(L)  ALT 0 - 44 U/L 16 12 11     05/08/18 Left Neck Thyroid Cartilage Biopsy:        RADIOGRAPHIC STUDIES: I have personally reviewed the radiological images as listed and agreed with the findings in the report. No results found.  ASSESSMENT & PLAN:   68 y.o. 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 Bone marrow biopsy apparently showed 50% kappa restricted plasma cells with an initial M spike of 3.4 g/dL with evidence of lytic lesions on his PET/CT scan including right rib fracture and lesions of the lumbar and cervical spine. -Patient was treated under protocol 11-C-0221 with from 02/21/2013 with  Carfilzomib/Revlimid/Dexamethasone for  a total of 8 cycles. He reports that his stem cells were collected and stored after 4 cycles -Posttreatment bone marrow biopsy showed less than 5% plasma cells with a negative flow cytometry and improvement in his previously PET avid lesions. Some concern for new focus of activity at C2 lytic lesion at L4 and several small lesions throughout the vertebrae. -Patient was on maintenance lenalidomide 10 mg by mouth daily for 2 years or more until end of 2014. 08/19/2013 - bone marrow biopsy showed normocellular marrow with less than 5% plasma cells. PET CT scan showed decreased focal metabolic ligament prior rib lesions and no new lesions. Flow cytometry was negative for residual disease. Serum and urine electrophoresis and IFE showed no evidence of monoclonal protein. -Patient notes that he was off protocol for a few years due to lack of clinical trial research funding. -Patient returned to follow-up protocol and began to follow and receive his primary treatment at [facility]  -Patient's labs show minimal IgG kappa protein on IFE and a CT chest in May 2018 that did not show any overt signs of myeloma progression in the bones.  Rpt Myeloma labs 03/2017 - M spike of 0.3g/dl with a repeat M spike stable at 0.3 g/dL on 05/23/2017 which remains stable on labs today 07/25/17 Serum kappa lambda free light chain ratio not significantly changed. PET/CT scan showed a single metabolically active sternal lesion without any overt bony destruction. Bone marrow biopsy done on 06/06/2017 - shows no overt involvement with multiple myeloma.  04/24/18 CT Soft Tissue Neck revealed Left laryngeal 3 cm soft tissue mass appears to have originated within and is expanding the left thyroid cartilage.  This is new since the August 2018 PET-CT, and there is absent lymphadenopathy. 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 Primary cartilaginous neoplasm, squamous cell carcinoma, and other differential  considerations are less likely. Superimposed widespread multiple myeloma lesions in the visible skeleton.  05/08/18 Left neck thyroid cartilage biopsy revealed Plasma cell neoplasm.  05/22/18 PET/CT revealed Soft tissue mass centered around the destroyed left thyroid cartilage is hypermetabolic and consistent with known plasmacytoma. No adenopathy in the neck. 2. Hypermetabolic 3 cm cutaneous and subcutaneous soft tissue mass involving the posterior upper thorax suspicious for cutaneous manifestation of myeloma (plasmacytoma). 3. Persistent and slightly progressive hypermetabolic sternal lesion. 4. Diffuse lytic myelomatous lesions throughout the bony structures but no other hypermetabolic foci. 5. Focus of hypermetabolism in the left aspect of the lower prostate gland, not present on the prior study. This could be an area of infection/inflammation or potential prostate cancer. Recommend correlation with physical examination and PSA level.    Completed RT between 06/04/18 and 06/15/18 of 25 Gy by 10 fractions  The pt pursued a clinical trial at the [facility] with RT and immunotherapy in October 2019 through October 18, 2018 with further information available at [email or web address]  10/18/18 M Protein increased to 3.5g, from 0.7g at our last visit on 07/10/18. 10/02/18 PET/CT from [facility] indicated new hypermetabolism in left clavicle, left scapula, bilateral ribs, spine, and left proximal femur 10/18/18 MRI [facility] ndicated a pathologic fracture at T12   11/23/18 NM Bone revealed Patchy uptake throughout the ribs and spine with areas of focally increased activity in both T12 pedicles and the right L1 pedicle. This focal activity could be stress related without clear corresponding fracture on available prior studies.  2) h/o Elevated bilirubin level . ? revlmid vs Gilberts  3) PET/CT abnormality in the Prostate. PSA levels WNL -Primary care physician to consider urology referral  4) Abnormal  focus of FDG avid at a spot in the liver - will need to be monitor on f/u scans. No pain or other symptoms at this time.  PLAN:  -Discussed pt labwork today, 01/01/19; blood counts and chemistries are stable -Last available MMP from 12/25/18 revealed M Protein decreased to 1.2g from 4.1g on 11/06/18 -The pt has no prohibitive toxicities from continuing C3D1 Daratumumab, Dexamethasone, and Carfilzomib at this time. -Will now move Daratumumab infusions to every other week, on D1 and D15, every 28 days -Will increase Carfilzomib tomorrow from 26m/m2 to 529mm2 -Advised that pt continue to follow with PT, but not over-exert on his own at home. Recommend back brace otc, or follow up with Dr. MuPercell MillerAdvised continued crowd avoidance, and infection prevention strategies -Recommend C-shaped pillow to alleviate pressure -Continue 2570mFentanyl patch and Oxycodone for break through pain -Continue with outpatient PT -Baseline laxative use of 2 pills Senna S at night, and one Miralax. Back off if diarrhea develops. -100-150cc Magnesium citrate if no bowel movement is achieved after 2-3 days -Flexeril prn for muscle relaxation, pt will use this minimally and infrequently. Stop Valium, which pt was using infrequently. -Continue Zometa every 4 weeks -Consume at least 60oz of water each day -Continue Acyclovir -Discussed Port placement- pt would like to wait on this -Continue salt and baking soda mouthwashes -Will see the pt back in 4 weeks   Plz schedule C3 and C4 per orders Labs on D1,8 and D15 RTC with Dr KalIrene LimboD1   All of the patients questions were answered with apparent satisfaction. The patient knows to  call the clinic with any problems, questions or concerns.  The total time spent in the appt was 25 minutes and more than 50% was on counseling and direct patient cares.    Sullivan Lone MD Gonzales AAHIVMS [facility] Hematology/Oncology Physician [facility]  (Office):        [phone] (Work cell):  [phone] (Fax):           [phone]  I, Baldwin Jamaica, am acting as a scribe for Dr. Sullivan Lone.   .I have reviewed the above documentation for accuracy and completeness, and I agree with the above. [name] MD

## 2019-01-01 ENCOUNTER — Telehealth: Payer: Self-pay | Admitting: Hematology

## 2019-01-01 ENCOUNTER — Inpatient Hospital Stay: Payer: Medicare Other

## 2019-01-01 ENCOUNTER — Inpatient Hospital Stay (HOSPITAL_BASED_OUTPATIENT_CLINIC_OR_DEPARTMENT_OTHER): Payer: Medicare Other | Admitting: Hematology

## 2019-01-01 ENCOUNTER — Ambulatory Visit: Payer: Medicare Other | Admitting: Physical Therapy

## 2019-01-01 ENCOUNTER — Encounter: Payer: Self-pay | Admitting: Physical Therapy

## 2019-01-01 ENCOUNTER — Other Ambulatory Visit: Payer: Self-pay

## 2019-01-01 VITALS — BP 128/74 | HR 75 | Temp 98.8°F | Resp 18 | Ht 73.0 in | Wt 224.1 lb

## 2019-01-01 DIAGNOSIS — Z7189 Other specified counseling: Secondary | ICD-10-CM

## 2019-01-01 DIAGNOSIS — M25511 Pain in right shoulder: Secondary | ICD-10-CM

## 2019-01-01 DIAGNOSIS — R29898 Other symptoms and signs involving the musculoskeletal system: Secondary | ICD-10-CM

## 2019-01-01 DIAGNOSIS — Z79899 Other long term (current) drug therapy: Secondary | ICD-10-CM

## 2019-01-01 DIAGNOSIS — E119 Type 2 diabetes mellitus without complications: Secondary | ICD-10-CM | POA: Diagnosis not present

## 2019-01-01 DIAGNOSIS — C9002 Multiple myeloma in relapse: Secondary | ICD-10-CM

## 2019-01-01 DIAGNOSIS — M6281 Muscle weakness (generalized): Secondary | ICD-10-CM

## 2019-01-01 DIAGNOSIS — M546 Pain in thoracic spine: Secondary | ICD-10-CM | POA: Diagnosis not present

## 2019-01-01 DIAGNOSIS — C9000 Multiple myeloma not having achieved remission: Secondary | ICD-10-CM

## 2019-01-01 DIAGNOSIS — Z5112 Encounter for antineoplastic immunotherapy: Secondary | ICD-10-CM | POA: Diagnosis not present

## 2019-01-01 DIAGNOSIS — K219 Gastro-esophageal reflux disease without esophagitis: Secondary | ICD-10-CM | POA: Diagnosis not present

## 2019-01-01 DIAGNOSIS — Z9221 Personal history of antineoplastic chemotherapy: Secondary | ICD-10-CM | POA: Diagnosis not present

## 2019-01-01 DIAGNOSIS — M25512 Pain in left shoulder: Secondary | ICD-10-CM

## 2019-01-01 DIAGNOSIS — E785 Hyperlipidemia, unspecified: Secondary | ICD-10-CM | POA: Diagnosis not present

## 2019-01-01 LAB — CMP (CANCER CENTER ONLY)
ALT: 16 U/L (ref 0–44)
AST: 12 U/L — ABNORMAL LOW (ref 15–41)
Albumin: 3.4 g/dL — ABNORMAL LOW (ref 3.5–5.0)
Alkaline Phosphatase: 118 U/L (ref 38–126)
Anion gap: 8 (ref 5–15)
BUN: 10 mg/dL (ref 8–23)
CO2: 25 mmol/L (ref 22–32)
Calcium: 8.1 mg/dL — ABNORMAL LOW (ref 8.9–10.3)
Chloride: 106 mmol/L (ref 98–111)
Creatinine: 0.79 mg/dL (ref 0.61–1.24)
GFR, Est AFR Am: >60 mL/min (ref >60)
Glucose, Bld: 116 mg/dL — ABNORMAL HIGH (ref 70–99)
Potassium: 3.8 mmol/L (ref 3.5–5.1)
Sodium: 139 mmol/L (ref 135–145)
Total Bilirubin: 1.1 mg/dL (ref 0.3–1.2)
Total Protein: 6.8 g/dL (ref 6.5–8.1)

## 2019-01-01 LAB — CBC WITH DIFFERENTIAL/PLATELET
Abs Immature Granulocytes: 0.02 10*3/uL (ref 0.00–0.07)
Basophils Absolute: 0 10*3/uL (ref 0.0–0.1)
Basophils Relative: 0 %
Eosinophils Absolute: 0.1 10*3/uL (ref 0.0–0.5)
Eosinophils Relative: 2 %
HCT: 30.1 % — ABNORMAL LOW (ref 39.0–52.0)
Hemoglobin: 10 g/dL — ABNORMAL LOW (ref 13.0–17.0)
Immature Granulocytes: 1 %
Lymphocytes Relative: 11 %
Lymphs Abs: 0.4 10*3/uL — ABNORMAL LOW (ref 0.7–4.0)
MCH: 33.4 pg (ref 26.0–34.0)
MCHC: 33.2 g/dL (ref 30.0–36.0)
MCV: 100.7 fL — ABNORMAL HIGH (ref 80.0–100.0)
MONOS PCT: 9 %
Monocytes Absolute: 0.3 10*3/uL (ref 0.1–1.0)
Neutro Abs: 2.8 10*3/uL (ref 1.7–7.7)
Neutrophils Relative %: 77 %
Platelets: 207 10*3/uL (ref 150–400)
RBC: 2.99 MIL/uL — ABNORMAL LOW (ref 4.22–5.81)
RDW: 16 % — ABNORMAL HIGH (ref 11.5–15.5)
WBC: 3.6 10*3/uL — ABNORMAL LOW (ref 4.0–10.5)
nRBC: 0 % (ref 0.0–0.2)

## 2019-01-01 NOTE — Therapy (Addendum)
[place], [place], [address] Phone: [phone]   Fax:  [phone]  Physical Therapy Treatment  Patient Details  Name: Michael [name] MRN: [id number] Date of Birth: Jul 03, 1951 Referring Provider (PT): Dr. Irene Limbo    Encounter Date: 01/01/2019  PT End of Session - 01/01/19 1349    Visit Number  7    Number of Visits  9    Date for PT Re-Evaluation  01/04/19    PT Start Time  1310   delay in check in   PT Stop Time  1347    PT Time Calculation (min)  37 min    Activity Tolerance  Patient tolerated treatment well    Behavior During Therapy  [facility] for tasks assessed/performed       Past Medical History:  Diagnosis Date  . Angioedema   . CPAP (continuous positive airway pressure) dependence    uses at night  . Gastroesophageal reflux disease without esophagitis   . History of radiation therapy 06/04/18- 06/15/18   25 Gy in 10 fractions to the laryngeal/ cartilage lesion  . Hyperlipidemia   . Multiple myeloma ([place]) 2012   Treated at [facility]  . Non morbid obesity due to excess calories   . Prediabetes   . Prediabetes   . Unspecified glaucoma(365.9)   . Urticaria, unspecified     Past Surgical History:  Procedure Laterality Date  . IR RADIOLOGIST EVAL & MGMT  11/13/2018  . PORTACATH PLACEMENT    . PORTACATH PLACEMENT     removed 02/2016    There were no vitals filed for this visit.  Subjective Assessment - 01/01/19 1311    Subjective  I felt real good after therapy last time. The problem I have had recently is I did yard work and screwed by back up really good.     Pertinent History  diagnosed in April 2012 in [place]. and was in a clinical trial at [facility] and had functional independence at that time. In 2016, he retired from [employer] and moved to [facility] and under the care of Dr. Irene Limbo.  He again began treatment for multiple myeloma. He joined another clinical trial in Nov. and switched back to [facility] for  treatment, But the cancer had progressed  by Jan 2020 possibly from clinical trial drug.  That was put on hold and he is back under care of Dr. Irene Limbo.  Before Dec. he had pretty good funcitoning     Patient Stated Goals  Pt wants to know what he is allowed to do and what he is not allowed to do in relation to the cancer?    Currently in Pain?  Yes    Pain Score  3     Pain Location  Back    Pain Orientation  Lower                       [facility] Adult PT Treatment/Exercise - 01/01/19 0001      Knee/Hip Exercises: Standing   Other Standing Knee Exercises  staggered stance weight shift onto front foot with 5 sec hold x 10 reps with each leg forward     Other Standing Knee Exercises  sit to stand from high mat x 10 reps with verval cues to extend at hips and not rely on hands and to push through entire foot to stand      Shoulder Exercises: Standing   Row  Strengthening;Right;Left;20 reps;Theraband  Theraband Level (Shoulder Row)  Level 3 (Green)    Retraction  Strengthening;Both;10 reps    Theraband Level (Shoulder Retraction)  Level 3 (Green)      Shoulder Exercises: Isometric Strengthening   Flexion  5X5"   bilaterally with v/c to do correctly   Extension  5X5"   bilaterally with v/c to keep elbow bent   External Rotation  5X5"   bilaterally with v/c to use pressure through forearm   ABduction  5X5"   bilaterally                 PT Long Term Goals - 12/06/18 1707      PT LONG TERM GOAL #1   Title  Pt will have painfree elevation of both shoulders to 115 degress so that he can perform his activities of daily life easeier     Time  4    Period  Weeks    Status  New      PT LONG TERM GOAL #2   Title  Pt will have a HEP for isometrics and trunk extension exercises so that he can maintain and improve strength at home     Time  4    Period  Weeks    Status  New      PT LONG TERM GOAL #3   Title  Pt will decrease his Normal TUG time to < 12 seconds  indicating a functional balance and mobility improvment     Baseline  15.08    Time  4    Period  Weeks    Status  New            Plan - 01/01/19 1350    Clinical Impression Statement  Pt is still requiring frequent verbal cues for correct way to stand up from chair without leaning towards one side or another. He also requires cueing to push through his entire foot when he stands because he tends to have his heels raised. Continued instruction in shoulder strengthening exercises to decrease shoulder pain. Pt is demonstrating improvements in general mobility as evidenced by somehwat increased ease standing up and changing positions today.     Rehab Potential  Good    Clinical Impairments Affecting Rehab Potential  acute compression fracture of T12 , recent cortisone shot in left shoulder     PT Frequency  2x / week    PT Duration  4 weeks    PT Treatment/Interventions  ADLs/Self Care Home Management;Moist Heat;Iontophoresis 11m/ml Dexamethasone;DME Instruction;Stair training;Functional mobility training;Neuromuscular re-education;Therapeutic exercise;Therapeutic activities;Patient/family education    PT Next Visit Plan   cont to review precautions for compression fracture: avoid spinal flexion and rotation and see if he tried new bed mobility techniques and if they are helping, assess indep with [place] Instruct in HEP for scapular retraction, low rows, wall taps and glute sets, gentle shoulder ROM, sit to stand from raised table and progression to AROM as able withing pain limits, consider use of TENS    PT Home Exercise Plan  Access Code: [email or web address]     Consulted and Agree with Plan of Care  Patient       Patient will benefit from skilled therapeutic intervention in order to improve the following deficits and impairments:  Abnormal gait, Pain, Postural dysfunction, Decreased mobility, Decreased activity tolerance, Decreased endurance, Decreased range of motion, Decreased strength, Impaired  perceived functional ability, Impaired UE functional use, Obesity, Difficulty walking, Decreased balance, Decreased knowledge of precautions  Visit Diagnosis: Pain in  thoracic spine  Muscle weakness (generalized)  Acute pain of right shoulder  Other symptoms and signs involving the musculoskeletal system  Acute pain of left shoulder     Problem List Patient Active Problem List   Diagnosis Date Noted  . Counseling regarding advance care planning and goals of care 10/28/2018  . Multiple myeloma ([place]) 03/28/2017    Allyson Sabal [facility] 01/01/2019, 1:53 PM  [place], [place], [address] Phone: [phone]   Fax:  [phone]  Name: Michael [name] MRN: [id number] Date of Birth: 1950-11-21  Manus Gunning, PT 01/01/19 1:53 PM  PHYSICAL THERAPY DISCHARGE SUMMARY  Visits from Start of Care: 7  Current functional level related to goals / functional outcomes: See above   Remaining deficits: See above   Education / Equipment: HEP  Plan: Patient agrees to discharge.  Patient goals were not met. Patient is being discharged due to not returning since the last visit.  ?????   Allyson Sabal [place], [place] 04/24/19 3:40 PM

## 2019-01-01 NOTE — Telephone Encounter (Signed)
Scheduled appt per 3/17 los. °

## 2019-01-02 ENCOUNTER — Inpatient Hospital Stay: Payer: Medicare Other

## 2019-01-02 VITALS — BP 130/68 | HR 75 | Temp 98.3°F | Resp 18

## 2019-01-02 DIAGNOSIS — E785 Hyperlipidemia, unspecified: Secondary | ICD-10-CM | POA: Diagnosis not present

## 2019-01-02 DIAGNOSIS — Z5112 Encounter for antineoplastic immunotherapy: Secondary | ICD-10-CM | POA: Diagnosis not present

## 2019-01-02 DIAGNOSIS — C9000 Multiple myeloma not having achieved remission: Secondary | ICD-10-CM

## 2019-01-02 DIAGNOSIS — E119 Type 2 diabetes mellitus without complications: Secondary | ICD-10-CM | POA: Diagnosis not present

## 2019-01-02 DIAGNOSIS — K219 Gastro-esophageal reflux disease without esophagitis: Secondary | ICD-10-CM | POA: Diagnosis not present

## 2019-01-02 DIAGNOSIS — Z9221 Personal history of antineoplastic chemotherapy: Secondary | ICD-10-CM | POA: Diagnosis not present

## 2019-01-02 DIAGNOSIS — C9002 Multiple myeloma in relapse: Secondary | ICD-10-CM | POA: Diagnosis not present

## 2019-01-02 DIAGNOSIS — Z7189 Other specified counseling: Secondary | ICD-10-CM

## 2019-01-02 MED ORDER — PROCHLORPERAZINE MALEATE 10 MG PO TABS
10.0000 mg | ORAL_TABLET | Freq: Once | ORAL | Status: AC
  Administered 2019-01-02: 10 mg via ORAL

## 2019-01-02 MED ORDER — SODIUM CHLORIDE 0.9 % IV SOLN
15.7000 mg/kg | Freq: Once | INTRAVENOUS | Status: AC
  Administered 2019-01-02: 1700 mg via INTRAVENOUS
  Filled 2019-01-02: qty 80

## 2019-01-02 MED ORDER — SODIUM CHLORIDE 0.9 % IV SOLN
Freq: Once | INTRAVENOUS | Status: AC
  Administered 2019-01-02: 09:00:00 via INTRAVENOUS
  Filled 2019-01-02: qty 250

## 2019-01-02 MED ORDER — SODIUM CHLORIDE 0.9 % IV SOLN
Freq: Once | INTRAVENOUS | Status: AC
  Administered 2019-01-02: 10:00:00 via INTRAVENOUS
  Filled 2019-01-02: qty 250

## 2019-01-02 MED ORDER — PROCHLORPERAZINE MALEATE 10 MG PO TABS
ORAL_TABLET | ORAL | Status: AC
  Filled 2019-01-02: qty 1

## 2019-01-02 MED ORDER — DIPHENHYDRAMINE HCL 25 MG PO CAPS
ORAL_CAPSULE | ORAL | Status: AC
  Filled 2019-01-02: qty 23

## 2019-01-02 MED ORDER — METHYLPREDNISOLONE SODIUM SUCC 125 MG IJ SOLR
100.0000 mg | Freq: Once | INTRAMUSCULAR | Status: AC
  Administered 2019-01-02: 100 mg via INTRAVENOUS

## 2019-01-02 MED ORDER — SODIUM CHLORIDE 0.9 % IV SOLN
20.0000 mg | Freq: Once | INTRAVENOUS | Status: AC
  Administered 2019-01-02: 20 mg via INTRAVENOUS
  Filled 2019-01-02: qty 2

## 2019-01-02 MED ORDER — MONTELUKAST SODIUM 10 MG PO TABS
10.0000 mg | ORAL_TABLET | Freq: Once | ORAL | Status: AC
  Administered 2019-01-02: 10 mg via ORAL

## 2019-01-02 MED ORDER — METHYLPREDNISOLONE SODIUM SUCC 125 MG IJ SOLR
INTRAMUSCULAR | Status: AC
  Filled 2019-01-02: qty 2

## 2019-01-02 MED ORDER — OXYCODONE HCL 5 MG PO TABS: 5.0000 mg | ORAL_TABLET | ORAL | 0 refills | Status: DC | PRN

## 2019-01-02 MED ORDER — DEXTROSE 5 % IV SOLN
55.0000 mg/m2 | Freq: Once | INTRAVENOUS | Status: AC
  Administered 2019-01-02: 130 mg via INTRAVENOUS
  Filled 2019-01-02: qty 60

## 2019-01-02 MED ORDER — MONTELUKAST SODIUM 10 MG PO TABS
ORAL_TABLET | ORAL | Status: AC
  Filled 2019-01-02: qty 1

## 2019-01-02 MED ORDER — ACETAMINOPHEN 325 MG PO TABS
ORAL_TABLET | ORAL | Status: AC
  Filled 2019-01-02: qty 2

## 2019-01-02 MED ORDER — FENTANYL 25 MCG/HR TD PT72: 1.0000 | MEDICATED_PATCH | TRANSDERMAL | 0 refills | Status: DC

## 2019-01-02 MED ORDER — DEXAMETHASONE 4 MG PO TABS: ORAL_TABLET | ORAL | 4 refills | Status: DC

## 2019-01-02 MED ORDER — DIPHENHYDRAMINE HCL 25 MG PO CAPS
50.0000 mg | ORAL_CAPSULE | Freq: Once | ORAL | Status: AC
  Administered 2019-01-02: 50 mg via ORAL

## 2019-01-02 MED ORDER — ACETAMINOPHEN 325 MG PO TABS
650.0000 mg | ORAL_TABLET | Freq: Once | ORAL | Status: AC
  Administered 2019-01-02: 650 mg via ORAL

## 2019-01-02 NOTE — Patient Instructions (Signed)
[facility] Discharge Instructions for Patients Receiving Chemotherapy  Today you received the following chemotherapy agents: Kyprolis, Darzalex  To help prevent nausea and vomiting after your treatment, we encourage you to take your nausea medication as directed.   If you develop nausea and vomiting that is not controlled by your nausea medication, call the clinic.   BELOW ARE SYMPTOMS THAT SHOULD BE REPORTED IMMEDIATELY:  *FEVER GREATER THAN 100.5 F  *CHILLS WITH OR WITHOUT FEVER  NAUSEA AND VOMITING THAT IS NOT CONTROLLED WITH YOUR NAUSEA MEDICATION  *UNUSUAL SHORTNESS OF BREATH  *UNUSUAL BRUISING OR BLEEDING  TENDERNESS IN MOUTH AND THROAT WITH OR WITHOUT PRESENCE OF ULCERS  *URINARY PROBLEMS  *BOWEL PROBLEMS  UNUSUAL RASH Items with * indicate a potential emergency and should be followed up as soon as possible.  Feel free to call the clinic should you have any questions or concerns. The clinic phone number is [phone].  Please show the CHEMO ALERT CARD at check-in to the Emergency Department and triage nurse.   

## 2019-01-03 ENCOUNTER — Ambulatory Visit: Payer: Medicare Other | Admitting: Physical Therapy

## 2019-01-07 ENCOUNTER — Ambulatory Visit: Payer: Medicare Other | Admitting: Physical Therapy

## 2019-01-08 ENCOUNTER — Other Ambulatory Visit: Payer: Medicare Other

## 2019-01-08 ENCOUNTER — Encounter: Payer: Medicare Other | Admitting: Physical Therapy

## 2019-01-09 ENCOUNTER — Other Ambulatory Visit: Payer: Self-pay

## 2019-01-09 ENCOUNTER — Observation Stay (HOSPITAL_COMMUNITY)
Admission: EM | Admit: 2019-01-09 | Discharge: 2019-01-10 | Disposition: A | Discharge status: Home / Self Care | Payer: Medicare Other | Attending: Family Medicine | Admitting: Family Medicine

## 2019-01-09 ENCOUNTER — Encounter (HOSPITAL_COMMUNITY): Payer: Self-pay | Admitting: *Deleted

## 2019-01-09 ENCOUNTER — Inpatient Hospital Stay: Payer: Medicare Other

## 2019-01-09 ENCOUNTER — Emergency Department (HOSPITAL_COMMUNITY): Payer: Medicare Other

## 2019-01-09 VITALS — BP 141/74 | HR 75 | Temp 99.4°F | Resp 16

## 2019-01-09 DIAGNOSIS — R112 Nausea with vomiting, unspecified: Secondary | ICD-10-CM | POA: Diagnosis present

## 2019-01-09 DIAGNOSIS — Z9221 Personal history of antineoplastic chemotherapy: Secondary | ICD-10-CM | POA: Diagnosis not present

## 2019-01-09 DIAGNOSIS — Z20828 Contact with and (suspected) exposure to other viral communicable diseases: Secondary | ICD-10-CM | POA: Diagnosis not present

## 2019-01-09 DIAGNOSIS — C9000 Multiple myeloma not having achieved remission: Secondary | ICD-10-CM | POA: Diagnosis not present

## 2019-01-09 DIAGNOSIS — Z79899 Other long term (current) drug therapy: Secondary | ICD-10-CM | POA: Diagnosis not present

## 2019-01-09 DIAGNOSIS — R509 Fever, unspecified: Secondary | ICD-10-CM

## 2019-01-09 DIAGNOSIS — Z5112 Encounter for antineoplastic immunotherapy: Secondary | ICD-10-CM | POA: Diagnosis not present

## 2019-01-09 DIAGNOSIS — G8929 Other chronic pain: Secondary | ICD-10-CM | POA: Diagnosis not present

## 2019-01-09 DIAGNOSIS — C9002 Multiple myeloma in relapse: Secondary | ICD-10-CM | POA: Diagnosis not present

## 2019-01-09 DIAGNOSIS — Z859 Personal history of malignant neoplasm, unspecified: Secondary | ICD-10-CM

## 2019-01-09 DIAGNOSIS — E785 Hyperlipidemia, unspecified: Secondary | ICD-10-CM | POA: Diagnosis not present

## 2019-01-09 DIAGNOSIS — R918 Other nonspecific abnormal finding of lung field: Secondary | ICD-10-CM | POA: Diagnosis not present

## 2019-01-09 DIAGNOSIS — Z7189 Other specified counseling: Secondary | ICD-10-CM

## 2019-01-09 DIAGNOSIS — R000 Tachycardia, unspecified: Secondary | ICD-10-CM | POA: Diagnosis not present

## 2019-01-09 DIAGNOSIS — E119 Type 2 diabetes mellitus without complications: Secondary | ICD-10-CM | POA: Diagnosis not present

## 2019-01-09 DIAGNOSIS — Z7982 Long term (current) use of aspirin: Secondary | ICD-10-CM | POA: Insufficient documentation

## 2019-01-09 DIAGNOSIS — R6510 Systemic inflammatory response syndrome (SIRS) of non-infectious origin without acute organ dysfunction: Secondary | ICD-10-CM | POA: Diagnosis present

## 2019-01-09 DIAGNOSIS — K219 Gastro-esophageal reflux disease without esophagitis: Secondary | ICD-10-CM | POA: Diagnosis not present

## 2019-01-09 LAB — CBC WITH DIFFERENTIAL/PLATELET
Abs Immature Granulocytes: 0.01 10*3/uL (ref 0.00–0.07)
Basophils Absolute: 0 10*3/uL (ref 0.0–0.1)
Basophils Relative: 0 %
EOS PCT: 2 %
Eosinophils Absolute: 0.1 10*3/uL (ref 0.0–0.5)
HCT: 32.5 % — ABNORMAL LOW (ref 39.0–52.0)
Hemoglobin: 10.6 g/dL — ABNORMAL LOW (ref 13.0–17.0)
Immature Granulocytes: 0 %
Lymphocytes Relative: 13 %
Lymphs Abs: 0.5 10*3/uL — ABNORMAL LOW (ref 0.7–4.0)
MCH: 33 pg (ref 26.0–34.0)
MCHC: 32.6 g/dL (ref 30.0–36.0)
MCV: 101.2 fL — AB (ref 80.0–100.0)
Monocytes Absolute: 0.4 10*3/uL (ref 0.1–1.0)
Monocytes Relative: 10 %
Neutro Abs: 2.8 10*3/uL (ref 1.7–7.7)
Neutrophils Relative %: 75 %
Platelets: 166 10*3/uL (ref 150–400)
RBC: 3.21 MIL/uL — ABNORMAL LOW (ref 4.22–5.81)
RDW: 15.2 % (ref 11.5–15.5)
WBC: 3.8 10*3/uL — ABNORMAL LOW (ref 4.0–10.5)
nRBC: 0 % (ref 0.0–0.2)

## 2019-01-09 LAB — CMP (CANCER CENTER ONLY)
ALT: 16 U/L (ref 0–44)
AST: 12 U/L — ABNORMAL LOW (ref 15–41)
Albumin: 3.4 g/dL — ABNORMAL LOW (ref 3.5–5.0)
Alkaline Phosphatase: 107 U/L (ref 38–126)
Anion gap: 10 (ref 5–15)
BILIRUBIN TOTAL: 0.7 mg/dL (ref 0.3–1.2)
BUN: 7 mg/dL — ABNORMAL LOW (ref 8–23)
CO2: 25 mmol/L (ref 22–32)
Calcium: 7.9 mg/dL — ABNORMAL LOW (ref 8.9–10.3)
Chloride: 105 mmol/L (ref 98–111)
Creatinine: 0.71 mg/dL (ref 0.61–1.24)
GFR, Est AFR Am: >60 mL/min (ref >60)
GFR, Estimated: >60 mL/min (ref >60)
Glucose, Bld: 103 mg/dL — ABNORMAL HIGH (ref 70–99)
Potassium: 3.7 mmol/L (ref 3.5–5.1)
Sodium: 140 mmol/L (ref 135–145)
Total Protein: 6.6 g/dL (ref 6.5–8.1)

## 2019-01-09 LAB — CBC
HCT: 30.6 % — ABNORMAL LOW (ref 39.0–52.0)
Hemoglobin: 10.1 g/dL — ABNORMAL LOW (ref 13.0–17.0)
MCH: 33.3 pg (ref 26.0–34.0)
MCHC: 33 g/dL (ref 30.0–36.0)
MCV: 101 fL — ABNORMAL HIGH (ref 80.0–100.0)
Platelets: 160 10*3/uL (ref 150–400)
RBC: 3.03 MIL/uL — ABNORMAL LOW (ref 4.22–5.81)
RDW: 15.6 % — ABNORMAL HIGH (ref 11.5–15.5)
WBC: 13.1 10*3/uL — ABNORMAL HIGH (ref 4.0–10.5)
nRBC: 0 % (ref 0.0–0.2)

## 2019-01-09 LAB — COMPREHENSIVE METABOLIC PANEL
ALT: 15 U/L (ref 0–44)
AST: 15 U/L (ref 15–41)
Albumin: 3.5 g/dL (ref 3.5–5.0)
Alkaline Phosphatase: 93 U/L (ref 38–126)
Anion gap: 9 (ref 5–15)
BUN: 11 mg/dL (ref 8–23)
CO2: 25 mmol/L (ref 22–32)
Calcium: 8.8 mg/dL — ABNORMAL LOW (ref 8.9–10.3)
Chloride: 108 mmol/L (ref 98–111)
Creatinine, Ser: 0.78 mg/dL (ref 0.61–1.24)
GFR calc Af Amer: >60 mL/min (ref >60)
GFR calc non Af Amer: >60 mL/min (ref >60)
Glucose, Bld: 129 mg/dL — ABNORMAL HIGH (ref 70–99)
Potassium: 3.7 mmol/L (ref 3.5–5.1)
Sodium: 142 mmol/L (ref 135–145)
Total Bilirubin: 1.5 mg/dL — ABNORMAL HIGH (ref 0.3–1.2)
Total Protein: 6.5 g/dL (ref 6.5–8.1)

## 2019-01-09 LAB — LACTIC ACID, PLASMA: Lactic Acid, Venous: 1.7 mmol/L (ref 0.5–1.9)

## 2019-01-09 LAB — LIPASE, BLOOD: Lipase: 27 U/L (ref 11–51)

## 2019-01-09 MED ORDER — ASPIRIN EC 81 MG PO TBEC
81.0000 mg | DELAYED_RELEASE_TABLET | Freq: Every day | ORAL | Status: DC
  Administered 2019-01-10: 81 mg via ORAL
  Filled 2019-01-09: qty 1

## 2019-01-09 MED ORDER — ACETAMINOPHEN 325 MG PO TABS
650.0000 mg | ORAL_TABLET | Freq: Once | ORAL | Status: AC
  Administered 2019-01-09: 650 mg via ORAL
  Filled 2019-01-09: qty 2

## 2019-01-09 MED ORDER — PROCHLORPERAZINE MALEATE 10 MG PO TABS
10.0000 mg | ORAL_TABLET | Freq: Once | ORAL | Status: AC
  Administered 2019-01-09: 10 mg via ORAL

## 2019-01-09 MED ORDER — VANCOMYCIN HCL 10 G IV SOLR
2000.0000 mg | Freq: Once | INTRAVENOUS | Status: AC
  Administered 2019-01-09: 2000 mg via INTRAVENOUS
  Filled 2019-01-09: qty 2000

## 2019-01-09 MED ORDER — ENOXAPARIN SODIUM 40 MG/0.4ML ~~LOC~~ SOLN
40.0000 mg | Freq: Every day | SUBCUTANEOUS | Status: DC
  Administered 2019-01-09: 40 mg via SUBCUTANEOUS
  Filled 2019-01-09: qty 0.4

## 2019-01-09 MED ORDER — FENTANYL 25 MCG/HR TD PT72
1.0000 | MEDICATED_PATCH | TRANSDERMAL | Status: DC
  Filled 2019-01-09: qty 1

## 2019-01-09 MED ORDER — ONDANSETRON HCL 4 MG/2ML IJ SOLN: 4.0000 mg | Freq: Four times a day (QID) | INTRAMUSCULAR | Status: DC | PRN

## 2019-01-09 MED ORDER — OXYCODONE HCL 5 MG PO TABS: 5.0000 mg | ORAL_TABLET | ORAL | Status: DC | PRN

## 2019-01-09 MED ORDER — SODIUM CHLORIDE 0.9 % IV SOLN
Freq: Once | INTRAVENOUS | Status: AC
  Administered 2019-01-09: 09:00:00 via INTRAVENOUS
  Filled 2019-01-09: qty 250

## 2019-01-09 MED ORDER — SODIUM CHLORIDE 0.9 % IV BOLUS
1000.0000 mL | Freq: Once | INTRAVENOUS | Status: AC
  Administered 2019-01-09: 1000 mL via INTRAVENOUS

## 2019-01-09 MED ORDER — PROCHLORPERAZINE MALEATE 10 MG PO TABS
ORAL_TABLET | ORAL | Status: AC
  Filled 2019-01-09: qty 1

## 2019-01-09 MED ORDER — METRONIDAZOLE IN NACL 5-0.79 MG/ML-% IV SOLN
500.0000 mg | Freq: Three times a day (TID) | INTRAVENOUS | Status: DC
  Administered 2019-01-10: 500 mg via INTRAVENOUS
  Filled 2019-01-09: qty 100

## 2019-01-09 MED ORDER — SODIUM CHLORIDE 0.9% FLUSH
3.0000 mL | Freq: Once | INTRAVENOUS | Status: AC
  Administered 2019-01-09: 3 mL via INTRAVENOUS

## 2019-01-09 MED ORDER — SODIUM CHLORIDE 0.9 % IV SOLN
Freq: Once | INTRAVENOUS | Status: DC
  Filled 2019-01-09: qty 250

## 2019-01-09 MED ORDER — ACYCLOVIR 400 MG PO TABS
400.0000 mg | ORAL_TABLET | Freq: Two times a day (BID) | ORAL | Status: DC
  Administered 2019-01-10: 400 mg via ORAL
  Filled 2019-01-09: qty 1

## 2019-01-09 MED ORDER — SODIUM CHLORIDE 0.9 % IV SOLN
2.0000 g | Freq: Once | INTRAVENOUS | Status: AC
  Administered 2019-01-09: 2 g via INTRAVENOUS
  Filled 2019-01-09: qty 2

## 2019-01-09 MED ORDER — DEXTROSE 5 % IV SOLN
55.0000 mg/m2 | Freq: Once | INTRAVENOUS | Status: AC
  Administered 2019-01-09: 130 mg via INTRAVENOUS
  Filled 2019-01-09: qty 5

## 2019-01-09 MED ORDER — POLYETHYLENE GLYCOL 3350 17 G PO PACK
17.0000 g | PACK | Freq: Every day | ORAL | Status: DC
  Administered 2019-01-10: 17 g via ORAL
  Filled 2019-01-09: qty 1

## 2019-01-09 MED ORDER — ACETAMINOPHEN 325 MG PO TABS: 650.0000 mg | ORAL_TABLET | Freq: Four times a day (QID) | ORAL | Status: DC | PRN

## 2019-01-09 MED ORDER — METRONIDAZOLE IN NACL 5-0.79 MG/ML-% IV SOLN
500.0000 mg | Freq: Once | INTRAVENOUS | Status: AC
  Administered 2019-01-09: 500 mg via INTRAVENOUS
  Filled 2019-01-09: qty 100

## 2019-01-09 MED ORDER — PANTOPRAZOLE SODIUM 40 MG PO TBEC
40.0000 mg | DELAYED_RELEASE_TABLET | Freq: Every day | ORAL | Status: DC
  Administered 2019-01-10: 40 mg via ORAL
  Filled 2019-01-09: qty 1

## 2019-01-09 MED ORDER — ATORVASTATIN CALCIUM 20 MG PO TABS: 20.0000 mg | ORAL_TABLET | Freq: Every day | ORAL | Status: DC

## 2019-01-09 MED ORDER — SODIUM CHLORIDE 0.9 % IV BOLUS
500.0000 mL | Freq: Once | INTRAVENOUS | Status: AC
  Administered 2019-01-09: 500 mL via INTRAVENOUS

## 2019-01-09 MED ORDER — SENNOSIDES-DOCUSATE SODIUM 8.6-50 MG PO TABS
1.0000 | ORAL_TABLET | Freq: Two times a day (BID) | ORAL | Status: DC
  Administered 2019-01-10: 1 via ORAL
  Filled 2019-01-09: qty 1

## 2019-01-09 MED ORDER — ONDANSETRON 4 MG PO TBDP: 4.0000 mg | ORAL_TABLET | Freq: Once | ORAL | Status: DC | PRN

## 2019-01-09 MED ORDER — ONDANSETRON HCL 4 MG PO TABS: 4.0000 mg | ORAL_TABLET | Freq: Four times a day (QID) | ORAL | Status: DC | PRN

## 2019-01-09 MED ORDER — VANCOMYCIN HCL IN DEXTROSE 1-5 GM/200ML-% IV SOLN: 1000.0000 mg | Freq: Once | INTRAVENOUS | Status: DC

## 2019-01-09 MED ORDER — ACETAMINOPHEN 650 MG RE SUPP: 650.0000 mg | Freq: Four times a day (QID) | RECTAL | Status: DC | PRN

## 2019-01-09 NOTE — H&P (Signed)
History and Physical    Michael [name] KGM:[id number] DOB: 1951/08/28 DOA: 01/09/2019  PCP: [name], MD   Patient coming from: Home   Chief Complaint: Chills, nausea/vomiting   HPI: Michael [name] is a 68 y.o. male with medical history significant for multiple myeloma, OSA on CPAP, and GERD, now presenting to the emergency department for evaluation of chills and nausea with one episode of vomiting.  Patient reports that he had been in his usual state of health and was having an uneventful day until this afternoon when he developed nausea and had one episode of vomiting.  He developed chills around the same time.  He denies any cough, shortness of breath, rhinorrhea, sore throat, dysuria, or flank pain.  He denies any neck stiffness or headache.  He denies abdominal pain per se, but reports some chronic abdominal discomfort that is unchanged.  He denies any rash or wounds.  He has not traveled anywhere recently, has been around his son and grandchildren who have not been ill and have not traveled anywhere recently either.  ED Course: Upon arrival to the ED, patient is found to be febrile to 38.7 C, saturating mid 90s on room air, tachycardic in the 110s, and with stable blood pressure.  EKG features sinus tachycardia with rate 114 and nonspecific repolarization abnormality.  Chest x-ray is notable for patchy bibasilar pulmonary opacity that could reflect aspiration or pneumonia.  Chemistry panel is notable for a mild chronic hyperbilirubinemia and CBC features a leukocytosis to 13,100 and stable macrocytic anemia.  Lactic acid is reassuringly normal.  Patient was given acetaminophen, Zofran, 500 cc normal saline, and was treated with empiric vancomycin, cefepime, and Flagyl in the ED.  He remains hemodynamically stable, in no apparent respiratory distress, and will be observed for ongoing evaluation and management.  Review of Systems:  All other systems reviewed and apart from HPI, are negative.  Past Medical History:  Diagnosis Date  . Angioedema   . CPAP (continuous positive airway pressure) dependence    uses at night  . Gastroesophageal reflux disease without esophagitis   . History of radiation therapy 06/04/18- 06/15/18   25 Gy in 10 fractions to the laryngeal/ cartilage lesion  . Hyperlipidemia   . Multiple myeloma ([place]) 2012   Treated at [facility]  . Non morbid obesity due to excess calories   . Prediabetes   . Prediabetes   . Unspecified glaucoma(365.9)   . Urticaria, unspecified     Past Surgical History:  Procedure Laterality Date  . IR RADIOLOGIST EVAL & MGMT  11/13/2018  . PORTACATH PLACEMENT    . PORTACATH PLACEMENT     removed 02/2016     reports that he has never smoked. He has never used smokeless tobacco. He reports current alcohol use. He reports that he does not use drugs.  No Known Allergies  Family History  Problem Relation Age of Onset  . Cancer Father      Prior to Admission medications   Medication Sig Start Date End Date Taking? Authorizing Provider  acyclovir (ZOVIRAX) 400 MG tablet Take 1 tablet (400 mg total) by mouth 2 (two) times daily. 11/06/18  Yes [name], MD  aspirin 81 MG tablet Take 81 mg by mouth daily.   Yes [provider]  atorvastatin (LIPITOR) 20 MG tablet Take 20 mg by mouth every evening.    Yes [provider]  dexamethasone (DECADRON) 4 MG tablet 49m (5 tabs) with breakfast the day after each daratumumab treatment  01/02/19  Yes [name], MD  fentaNYL (DURAGESIC) 25 MCG/HR Place 1 patch onto the skin every 3 (three) days. 01/02/19  Yes [name], MD  Multiple Vitamin (MULTIVITAMIN) tablet Take 1 tablet by mouth daily.   Yes [provider]  nystatin (MYCOSTATIN/NYSTOP) powder Apply 100,000 g topically daily as needed (rash).    Yes [provider]  ondansetron (ZOFRAN) 8 MG tablet Take 1 tablet (8 mg total) by mouth 2 (two) times daily as needed (Nausea or  vomiting). 11/06/18  Yes [name], MD  oxyCODONE (OXY IR/ROXICODONE) 5 MG immediate release tablet Take 1-2 tablets (5-10 mg total) by mouth every 4 (four) hours as needed for severe pain or breakthrough pain. 01/02/19  Yes [name], MD  pantoprazole (PROTONIX) 40 MG tablet Take 40 mg by mouth daily.    Yes [provider]  polyethylene glycol (MIRALAX) packet Take 17 g by mouth daily.   Yes [provider]  senna-docusate (SENOKOT-S) 8.6-50 MG tablet Take 2 tablets by mouth at bedtime. Takes 50 mg Patient taking differently: Take 1 tablet by mouth 2 (two) times daily. Takes 50 mg  11/14/18  Yes [name], MD  Lido-Capsaicin-Men-Methyl Sal (MEDI-PATCH-LIDOCAINE) 0.5-0.035-5-20 % PTCH Apply 1 patch topically 2 (two) times daily as needed. 10/15/18   Mesner, Corene Cornea, MD  NARCAN 4 MG/0.1ML LIQD nasal spray kit Place 1 spray into the nose once.  11/14/18   [provider]  prochlorperazine (COMPAZINE) 10 MG tablet Take 1 tablet (10 mg total) by mouth every 6 (six) hours as needed (Nausea or vomiting). 11/06/18   [name], MD    Physical Exam: Vitals:   01/09/19 2016 01/09/19 2020 01/09/19 2200 01/09/19 2215  BP: 129/76  101/65 118/76  Pulse: (!) 116  (!) 112 (!) 113  Resp: 18  20 16   Temp: (!) 101.6 F (38.7 C)     TempSrc: Oral     SpO2: 93%  91% 96%  Weight:  101.7 kg    Height:  6' 1"  (1.854 m)      Constitutional: NAD, calm  Eyes: PERTLA, lids and conjunctivae normal ENMT: Mucous membranes are moist. Posterior pharynx clear of any exudate or lesions.   Neck: normal, supple, no masses, no thyromegaly Respiratory: no wheezing, no rhonchi. Normal respiratory effort.  Cardiovascular: Rate ~110 and regular. Trace pedal edema bilaterally.   Abdomen: No distension, soft, mild diffuse tenderness, no rebound pain or guarding. Bowel sounds active.  Musculoskeletal: no clubbing / cyanosis. No joint deformity upper and lower  extremities.    Skin: no significant rashes, lesions, ulcers. Warm, dry, well-perfused. Neurologic: CN 2-12 grossly intact. Sensation intact. Strength 5/5 in all 4 limbs.  Psychiatric: Alert and oriented x 3. Pleasant and cooperative.    Labs on Admission: I have personally reviewed following labs and imaging studies  CBC: Recent Labs  Lab 01/09/19 0812 01/09/19 2123  WBC 3.8* 13.1*  NEUTROABS 2.8  --   HGB 10.6* 10.1*  HCT 32.5* 30.6*  MCV 101.2* 101.0*  PLT 166 716   Basic Metabolic Panel: Recent Labs  Lab 01/09/19 0812 01/09/19 2123  NA 140 142  K 3.7 3.7  CL 105 108  CO2 25 25  GLUCOSE 103* 129*  BUN 7* 11  CREATININE 0.71 0.78  CALCIUM 7.9* 8.8*   GFR: Estimated Creatinine Clearance: 110.8 mL/min (by C-G formula based on SCr of 0.78 mg/dL). Liver Function Tests: Recent Labs  Lab 01/09/19 9678 01/09/19 2123  AST 12* 15  ALT 16 15  ALKPHOS 107 93  BILITOT 0.7 1.5*  PROT 6.6 6.5  ALBUMIN 3.4* 3.5   Recent Labs  Lab 01/09/19 2123  LIPASE 27   No results for input(s): AMMONIA in the last 168 hours. Coagulation Profile: No results for input(s): INR, PROTIME in the last 168 hours. Cardiac Enzymes: No results for input(s): CKTOTAL, CKMB, CKMBINDEX, TROPONINI in the last 168 hours. BNP (last 3 results) No results for input(s): PROBNP in the last 8760 hours. HbA1C: No results for input(s): HGBA1C in the last 72 hours. CBG: No results for input(s): GLUCAP in the last 168 hours. Lipid Profile: No results for input(s): CHOL, HDL, LDLCALC, TRIG, CHOLHDL, LDLDIRECT in the last 72 hours. Thyroid Function Tests: No results for input(s): TSH, T4TOTAL, FREET4, T3FREE, THYROIDAB in the last 72 hours. Anemia Panel: No results for input(s): VITAMINB12, FOLATE, FERRITIN, TIBC, IRON, RETICCTPCT in the last 72 hours. Urine analysis: No results found for: COLORURINE, APPEARANCEUR, LABSPEC, PHURINE, GLUCOSEU, HGBUR, BILIRUBINUR, KETONESUR, PROTEINUR, UROBILINOGEN,  NITRITE, LEUKOCYTESUR Sepsis Labs: @LABRCNTIP (procalcitonin:4,lacticidven:4) )No results found for this or any previous visit (from the past 240 hour(s)).   Radiological Exams on Admission: Dg Chest Port 1 View  Result Date: 01/09/2019 CLINICAL DATA:  68 year old male with vomiting, fever chills. Multiple myeloma patient on chemotherapy. EXAM: PORTABLE CHEST 1 VIEW COMPARISON:  Skeletal survey 10/02/2018. FINDINGS: Portable AP semi upright view at 2104 hours. Patchy bibasilar pulmonary opacity is new since 2019. Lung volumes and mediastinal contours are stable and within normal limits. No pneumothorax or pulmonary edema. No pleural effusion identified. Heterogeneous bone mineralization in some areas. Chronic appearing right 6th rib fracture(s). IMPRESSION: Patchy bibasilar pulmonary opacity is nonspecific suspicious for bilateral aspiration or pneumonia in this clinical setting. Confirmation on PA and lateral views may be valuable when possible. Electronically Signed   By: Genevie Ann M.D.   On: 01/09/2019 21:21    EKG: Independently reviewed. Sinus tachycardia (rate 114), non-specific repolarization abnormality.   Assessment/Plan   1. SIRS  - Presents with several hours of chills, nausea, and one episode of vomiting  - Found to be febrile and tachycardic with leukocytosis, reassuringly normal lactate, and normal BP  - There is patchy bibasilar opacity on CXR that could reflect aspiration or PNA but patient denies any cough or SOB, is not hypoxic, and lungs are CTAB   - In terms of COVID, there is no travel, exposure to known case, or any respiratory s/s; gown, gloves, and mask with face shield was worn while collecting history and examining the patient  - Blood cultures collected in ED, 500 cc NS bolus given, and empiric broad-spectrum antibiotics were started  - Check UA and influenza PCR, continue empiric antibiotics for now, follow cultures and clinical course   2. Multiple myeloma  - Follow  with oncology and undergoing treatment with C3D1  - Continue acyclovir   3. Chronic pain  - No pain complaints on admission  - Continue fentanyl patch and as-needed oxycodone    4. Nausea, vomiting  - Presents with several hours of chills, nausea, and one episode of vomiting  - Abdominal exam is benign, nausea improved, and no vomiting since arrival in ED  - UA and influenza PCR ordered but not yet collected, will follow-up on these and continue IVF hydration and antiemetics      DVT prophylaxis: Lovenox  Code Status: Full  Family Communication: Discussed with patient  Consults called: None  Admission status: observation  Vianne Bulls, MD [facility] Pager [phone]  If 7PM-7AM, please contact night-coverage [email or web address] Password [email or web address]  01/09/2019, 10:40 PM

## 2019-01-09 NOTE — ED Notes (Signed)
ED TO INPATIENT HANDOFF REPORT  ED Nurse Name and Phone #: Tejay Martell RN [phone]   S Name/Age/Gender Michael [name] 68 y.o. male Room/Bed: WA20/WA20  Code Status   Code Status: Full Code  Home/SNF/Other Home Patient oriented to: self, place, time and situation Is this baseline? Yes   Triage Complete: Triage complete  Chief Complaint Ca Pt; Emesis  Triage Note Pt seen this a.m. for chemo tx.  Chemo started in Nov or Dec.  Pt took 1 oxycodone and 1 "vomiting" pill around 1330.  Pt stated "the vomiting started about 1 hour ago."   Allergies No Known Allergies  Level of Care/Admitting Diagnosis ED Disposition    ED Disposition Condition Comment   Admit  Hospital Area: [place] [[id number]]  Level of Care: Med-Surg [16]  Diagnosis: SIRS (systemic inflammatory response syndrome) [facility]) [[id number]]  Admitting Physician: Vianne Bulls [[id number]]  Attending Physician: Vianne Bulls [[id number]]  PT Class (Do Not Modify): Observation [104]  PT Acc Code (Do Not Modify): Observation [10022]       B Medical/Surgery History Past Medical History:  Diagnosis Date  . Angioedema   . CPAP (continuous positive airway pressure) dependence    uses at night  . Gastroesophageal reflux disease without esophagitis   . History of radiation therapy 06/04/18- 06/15/18   25 Gy in 10 fractions to the laryngeal/ cartilage lesion  . Hyperlipidemia   . Multiple myeloma ([place]) 2012   Treated at [place]  . Non morbid obesity due to excess calories   . Prediabetes   . Prediabetes   . Unspecified glaucoma(365.9)   . Urticaria, unspecified    Past Surgical History:  Procedure Laterality Date  . IR RADIOLOGIST EVAL & MGMT  11/13/2018  . PORTACATH PLACEMENT    . PORTACATH PLACEMENT     removed 02/2016     A IV Location/Drains/Wounds Patient Lines/Drains/Airways Status   Active Line/Drains/Airways    Name:   Placement date:   Placement time:   Site:   Days:   Peripheral IV 01/09/19  Right Wrist   01/09/19    2117    Wrist   less than 1          Intake/Output Last 24 hours  Intake/Output Summary (Last 24 hours) at 01/09/2019 2256 Last data filed at 01/09/2019 2239 Gross per 24 hour  Intake 600 ml  Output -  Net 600 ml    Labs/Imaging Results for orders placed or performed during the hospital encounter of 01/09/19 (from the past 48 hour(s))  Lipase, blood     Status: None   Collection Time: 01/09/19  9:23 PM  Result Value Ref Range   Lipase 27 11 - 51 U/L    Comment: Performed at [facility], [place] [address] [place], [place] [address]  Comprehensive metabolic panel     Status: Abnormal   Collection Time: 01/09/19  9:23 PM  Result Value Ref Range   Sodium 142 135 - 145 mmol/L   Potassium 3.7 3.5 - 5.1 mmol/L   Chloride 108 98 - 111 mmol/L   CO2 25 22 - 32 mmol/L   Glucose, Bld 129 (H) 70 - 99 mg/dL   BUN 11 8 - 23 mg/dL   Creatinine, Ser 0.78 0.61 - 1.24 mg/dL   Calcium 8.8 (L) 8.9 - 10.3 mg/dL   Total Protein 6.5 6.5 - 8.1 g/dL   Albumin 3.5 3.5 - 5.0 g/dL   AST 15 15 - 41 U/L  ALT 15 0 - 44 U/L   Alkaline Phosphatase 93 38 - 126 U/L   Total Bilirubin 1.5 (H) 0.3 - 1.2 mg/dL   GFR calc non Af Amer >60 >60 mL/min   GFR calc Af Amer >60 >60 mL/min   Anion gap 9 5 - 15    Comment: Performed at [facility], [place] [address] [place], [place] [address]  CBC     Status: Abnormal   Collection Time: 01/09/19  9:23 PM  Result Value Ref Range   WBC 13.1 (H) 4.0 - 10.5 K/uL   RBC 3.03 (L) 4.22 - 5.81 MIL/uL   Hemoglobin 10.1 (L) 13.0 - 17.0 g/dL   HCT 30.6 (L) 39.0 - 52.0 %   MCV 101.0 (H) 80.0 - 100.0 fL   MCH 33.3 26.0 - 34.0 pg   MCHC 33.0 30.0 - 36.0 g/dL   RDW 15.6 (H) 11.5 - 15.5 %   Platelets 160 150 - 400 K/uL   nRBC 0.0 0.0 - 0.2 %    Comment: Performed at [facility], [place] [address] [place], [place] [address]  Lactic acid, plasma     Status: None   Collection Time: 01/09/19  9:23 PM   Result Value Ref Range   Lactic Acid, Venous 1.7 0.5 - 1.9 mmol/L    Comment: Performed at [facility], [place] [address], [place], [place] [address]   Dg Chest Port 1 View  Result Date: 01/09/2019 CLINICAL DATA:  68 year old male with vomiting, fever chills. Multiple myeloma patient on chemotherapy. EXAM: PORTABLE CHEST 1 VIEW COMPARISON:  Skeletal survey 10/02/2018. FINDINGS: Portable AP semi upright view at 2104 hours. Patchy bibasilar pulmonary opacity is new since 2019. Lung volumes and mediastinal contours are stable and within normal limits. No pneumothorax or pulmonary edema. No pleural effusion identified. Heterogeneous bone mineralization in some areas. Chronic appearing right 6th rib fracture(s). IMPRESSION: Patchy bibasilar pulmonary opacity is nonspecific suspicious for bilateral aspiration or pneumonia in this clinical setting. Confirmation on PA and lateral views may be valuable when possible. Electronically Signed   By: Genevie Ann M.D.   On: 01/09/2019 21:21    Pending Labs Unresulted Labs (From admission, onward)    Start     Ordered   01/16/19 0500  Creatinine, serum  (enoxaparin (LOVENOX)    CrCl >/= 30 ml/min)  Weekly,   R    Comments:  while on enoxaparin therapy    01/09/19 2240   01/10/19 0500  HIV antibody (Routine Testing)  Tomorrow morning,   R     01/09/19 2240   01/10/19 0500  Comprehensive metabolic panel  Tomorrow morning,   R     01/09/19 2240   01/10/19 0500  CBC WITH DIFFERENTIAL  Tomorrow morning,   R     01/09/19 2240   01/09/19 2257  Influenza panel by PCR (type A & B)  (Influenza PCR Panel)  Once,   R     01/09/19 2256   01/09/19 2058  Blood Culture (routine x 2)  BLOOD CULTURE X 2,   STAT     01/09/19 2058   01/09/19 2058  Urine culture  ONCE - STAT,   STAT     01/09/19 2058   01/09/19 2023  Urinalysis, Routine w reflex microscopic  ONCE - STAT,   STAT     01/09/19 2022          Vitals/Pain Today's Vitals   01/09/19 2215  01/09/19 2243  01/09/19 2243 01/09/19 2243  BP: 118/76     Pulse: (!) 113     Resp: 16     Temp:  99.6 F (37.6 C)    TempSrc:  Oral    SpO2: 96%     Weight:      Height:      PainSc:  0-No pain 0-No pain 0-No pain    Isolation Precautions Droplet and Contact precautions  Medications Medications  ondansetron (ZOFRAN-ODT) disintegrating tablet 4 mg (has no administration in time range)  metroNIDAZOLE (FLAGYL) IVPB 500 mg (500 mg Intravenous New Bag/Given 01/09/19 2242)  vancomycin (VANCOCIN) 2,000 mg in sodium chloride 0.9 % 500 mL IVPB (has no administration in time range)  aspirin EC tablet 81 mg (has no administration in time range)  fentaNYL (DURAGESIC) 25 MCG/HR 1 patch (has no administration in time range)  oxyCODONE (Oxy IR/ROXICODONE) immediate release tablet 5-10 mg (has no administration in time range)  acyclovir (ZOVIRAX) tablet 400 mg (has no administration in time range)  atorvastatin (LIPITOR) tablet 20 mg (has no administration in time range)  pantoprazole (PROTONIX) EC tablet 40 mg (has no administration in time range)  polyethylene glycol (MIRALAX / GLYCOLAX) packet 17 g (has no administration in time range)  senna-docusate (Senokot-S) tablet 1 tablet (has no administration in time range)  enoxaparin (LOVENOX) injection 40 mg (has no administration in time range)  acetaminophen (TYLENOL) tablet 650 mg (has no administration in time range)    Or  acetaminophen (TYLENOL) suppository 650 mg (has no administration in time range)  ondansetron (ZOFRAN) tablet 4 mg (has no administration in time range)    Or  ondansetron (ZOFRAN) injection 4 mg (has no administration in time range)  metroNIDAZOLE (FLAGYL) IVPB 500 mg (has no administration in time range)  sodium chloride flush (NS) 0.9 % injection 3 mL (3 mLs Intravenous Given 01/09/19 2125)  acetaminophen (TYLENOL) tablet 650 mg (650 mg Oral Given 01/09/19 2209)  sodium chloride 0.9 % bolus 500 mL (0 mLs Intravenous  Stopped 01/09/19 2239)  ceFEPIme (MAXIPIME) 2 g in sodium chloride 0.9 % 100 mL IVPB (0 g Intravenous Stopped 01/09/19 2239)    Mobility walks Low fall risk   Focused Assessments Cardiac Assessment Handoff:    No results found for: CKTOTAL, CKMB, CKMBINDEX, TROPONINI No results found for: DDIMER Does the Patient currently have chest pain? No      R Recommendations: See Admitting Provider Note  Report given to:   Additional Notes:

## 2019-01-09 NOTE — Patient Instructions (Signed)
[facility] Discharge Instructions for Patients Receiving Chemotherapy  Today you received the following chemotherapy agents: Kyprolis  To help prevent nausea and vomiting after your treatment, we encourage you to take your nausea medication as directed.  If you develop nausea and vomiting that is not controlled by your nausea medication, call the clinic.   BELOW ARE SYMPTOMS THAT SHOULD BE REPORTED IMMEDIATELY:  *FEVER GREATER THAN 100.5 F  *CHILLS WITH OR WITHOUT FEVER  NAUSEA AND VOMITING THAT IS NOT CONTROLLED WITH YOUR NAUSEA MEDICATION  *UNUSUAL SHORTNESS OF BREATH  *UNUSUAL BRUISING OR BLEEDING  TENDERNESS IN MOUTH AND THROAT WITH OR WITHOUT PRESENCE OF ULCERS  *URINARY PROBLEMS  *BOWEL PROBLEMS  UNUSUAL RASH Items with * indicate a potential emergency and should be followed up as soon as possible.  Feel free to call the clinic should you have any questions or concerns. The clinic phone number is [phone].  Please show the [place] at check-in to the Emergency Department and triage nurse.  Hypocalcemia, Adult Hypocalcemia is when the level of calcium in a person's blood is below normal. Calcium is a mineral that is used by the body in many ways. A lack of blood calcium can affect the heart and muscles, make the bones more likely to break, and cause other problems. What are the causes? This condition may be caused by:  Decreased production (hypoparathyroidism) or improper use of parathyroid hormone.  Problems with the parathyroid glands or surgical removal of these glands.  Problems with parathyroid function after removal of the thyroid gland.  Lack (deficiency) of vitamin D or magnesium or both.  Kidney problems. Less common causes include:  Intestinal problems that interfere with nutrient absorption.  Alcoholism.  Low levels of a body protein that is called albumin.  Inflammation of the pancreas (pancreatitis).  Certain  medicines.  Severe infections (sepsis).  Certain diseases, such as sarcoidosis or hemochromatosis, that cause the parathyroid glands to be filled with cells or substances that are not normally present.  Breakdown of large amounts of muscle fiber.  High levels of phosphate in the body.  Cancer.  Massive blood transfusions, which usually occur with severe trauma. What are the signs or symptoms? Symptoms of this condition include:  Numbness and tingling in the fingers, toes, or around the mouth.  Muscle aches or cramps, especially in the legs, feet, and back.  Muscle twitches.  Abdominal cramping or pain.  Memory problems, confusion, or difficulty thinking.  Depression, anxiety, irritability, or changes in personality.  Fainting.  Chest pain.  Difficulty swallowing.  Changes in the sound of the voice.  Shortness of breath or wheezing.  General weakness and fatigue. Symptoms of severe hypocalcemia include:  Shaking uncontrollably (seizures).  Seizure of the voice box (laryngospasm).  Fast heartbeats (palpitations) and abnormal heart rhythms (arrhythmias). Long-term symptoms of this condition include:  Coarse, brittle hair and nails.  Dry skin or lasting (chronic) skin diseases (psoriasis, eczema,, or dermatitis).  Clouding of the eye lens (cataracts). How is this diagnosed? This condition is usually diagnosed with a blood test. You may also have other tests to help determine the underlying cause of the condition. For example, a test may be done that records the electrical activity of the heart (electrocardiogram,or ECG). How is this treated? Treatment for this condition may include:  Calcium given by mouth (orally) or given through an IV tube that is inserted into one of your veins. The method used for giving calcium will depend on  the severity of the condition.  Other minerals (electrolytes), such as magnesium. Other treatment will depend on the cause of the  condition. Follow these instructions at home:  Follow diet instructions from your health care provider or dietitian.  Take supplements only as told by your health care provider.  Keep all follow-up visits as told by your health care provider. This is important. Contact a health care provider if:  You have increased fatigue.  You have increased muscle twitching.  You have new swelling in the feet, ankles, or legs.  You develop changes in mood, memory, or personality. Get help right away if:  You have chest pain.  You have persistent rapid or irregular heartbeats.  You have difficulty breathing.  You faint.  You start to have seizures.  You have confusion. This information is not intended to replace advice given to you by your health care provider. Make sure you discuss any questions you have with your health care provider. Document Released: 03/23/2010 Document Revised: 03/10/2016 Document Reviewed: 02/18/2015 Elsevier Interactive Patient Education  2019 [employer].

## 2019-01-09 NOTE — ED Notes (Signed)
Family member number [phone].

## 2019-01-09 NOTE — ED Triage Notes (Signed)
Pt seen this a.m. for chemo tx.  Chemo started in Nov or Dec.  Pt took 1 oxycodone and 1 "vomiting" pill around 1330.  Pt stated "the vomiting started about 1 hour ago."

## 2019-01-09 NOTE — ED Provider Notes (Signed)
[facility] DEPT Provider Note   CSN: [id number] Arrival date & time: 01/09/19  2006    History   Chief Complaint Chief Complaint  Patient presents with  . Emesis    HPI Michael [name] is a 68 y.o. male with history of multiple myeloma diagnosed 2012 with recent relapse, ongoing chemotherapy infusions is here for evaluation of chills associated with nausea, nonbilious nonbloody emesis a few hours after her last chemotherapy infusion earlier today.  Febrile in the ED.  He denies any rhinorrhea, congestion, sore throat, cough, myalgias, shortness of breath, diarrhea, dysuria, hematuria, rash.  His wife recently diagnosed with bronchitis, having cough and finishing antibiotics.  States noted to have relapse of cancer 2 months ago.  Re-started current chemo infusions 6-8 weeks ago.  He reports chronic "rib", abdominal and low back pain that he attributes to his cancer lesions, recently unchanged.  Also has chronic constipation that he treats at home with MiraLAX, unchanged.  He took oxycodone around 2:30 PM today for his chronic pain and vomited soon after.  No recent travel.    HPI  Past Medical History:  Diagnosis Date  . Angioedema   . CPAP (continuous positive airway pressure) dependence    uses at night  . Gastroesophageal reflux disease without esophagitis   . History of radiation therapy 06/04/18- 06/15/18   25 Gy in 10 fractions to the laryngeal/ cartilage lesion  . Hyperlipidemia   . Multiple myeloma ([place]) 2012   Treated at [facility]  . Non morbid obesity due to excess calories   . Prediabetes   . Prediabetes   . Unspecified glaucoma(365.9)   . Urticaria, unspecified     Patient Active Problem List   Diagnosis Date Noted  . SIRS (systemic inflammatory response syndrome) ([place]) 01/09/2019  . Chronic pain 01/09/2019  . Counseling regarding advance care planning and goals of care 10/28/2018  . Multiple myeloma ([place]) 03/28/2017    Past Surgical  History:  Procedure Laterality Date  . IR RADIOLOGIST EVAL & MGMT  11/13/2018  . PORTACATH PLACEMENT    . PORTACATH PLACEMENT     removed 02/2016        Home Medications    Prior to Admission medications   Medication Sig Start Date End Date Taking? Authorizing Provider  acyclovir (ZOVIRAX) 400 MG tablet Take 1 tablet (400 mg total) by mouth 2 (two) times daily. 11/06/18  Yes [name], MD  aspirin 81 MG tablet Take 81 mg by mouth daily.   Yes [provider]  atorvastatin (LIPITOR) 20 MG tablet Take 20 mg by mouth every evening.    Yes [provider]  dexamethasone (DECADRON) 4 MG tablet 70m (5 tabs) with breakfast the day after each daratumumab treatment 01/02/19  Yes KBrunetta Genera MD  fentaNYL (DURAGESIC) 25 MCG/HR Place 1 patch onto the skin every 3 (three) days. 01/02/19  Yes KBrunetta Genera MD  Multiple Vitamin (MULTIVITAMIN) tablet Take 1 tablet by mouth daily.   Yes [provider]  nystatin (MYCOSTATIN/NYSTOP) powder Apply 100,000 g topically daily as needed (rash).    Yes [provider]  ondansetron (ZOFRAN) 8 MG tablet Take 1 tablet (8 mg total) by mouth 2 (two) times daily as needed (Nausea or vomiting). 11/06/18  Yes KBrunetta Genera MD  oxyCODONE (OXY IR/ROXICODONE) 5 MG immediate release tablet Take 1-2 tablets (5-10 mg total) by mouth every 4 (four) hours as needed for severe pain or breakthrough pain. 01/02/19  Yes KIrene Limbo  Cloria Spring, MD  pantoprazole (PROTONIX) 40 MG tablet Take 40 mg by mouth daily.    Yes [provider]  polyethylene glycol (MIRALAX) packet Take 17 g by mouth daily.   Yes [provider]  senna-docusate (SENOKOT-S) 8.6-50 MG tablet Take 2 tablets by mouth at bedtime. Takes 50 mg Patient taking differently: Take 1 tablet by mouth 2 (two) times daily. Takes 50 mg  11/14/18  Yes [name], MD  Lido-Capsaicin-Men-Methyl Sal (MEDI-PATCH-LIDOCAINE) 0.5-0.035-5-20 %  PTCH Apply 1 patch topically 2 (two) times daily as needed. 10/15/18   Mesner, Corene Cornea, MD  NARCAN 4 MG/0.1ML LIQD nasal spray kit Place 1 spray into the nose once.  11/14/18   [provider]  prochlorperazine (COMPAZINE) 10 MG tablet Take 1 tablet (10 mg total) by mouth every 6 (six) hours as needed (Nausea or vomiting). 11/06/18   [name], MD    Family History Family History  Problem Relation Age of Onset  . Cancer Father     Social History Social History   Tobacco Use  . Smoking status: Never Smoker  . Smokeless tobacco: Never Used  Substance Use Topics  . Alcohol use: Yes    Alcohol/week: 0.0 standard drinks    Comment: Occasional beer  . Drug use: No     Allergies   Patient has no known allergies.   Review of Systems Review of Systems  Constitutional: Positive for chills and fever.  Cardiovascular: Positive for chest pain (chronic).  Gastrointestinal: Positive for abdominal pain (chronic), nausea and vomiting.  Musculoskeletal: Positive for back pain (chronic).  Allergic/Immunologic: Positive for immunocompromised state.  All other systems reviewed and are negative.    Physical Exam Updated Vital Signs BP 118/76   Pulse (!) 113   Temp 99.6 F (37.6 C) (Oral)   Resp 16   Ht 6' 1"  (1.854 m)   Wt 101.7 kg   SpO2 96%   BMI 29.57 kg/m   Physical Exam Vitals signs and nursing note reviewed.  Constitutional:      Appearance: He is well-developed.     Comments: Non toxic.  HENT:     Head: Normocephalic and atraumatic.     Nose: Nose normal.     Mouth/Throat:     Comments: Dry MM  Eyes:     Conjunctiva/sclera: Conjunctivae normal.     Pupils: Pupils are equal, round, and reactive to light.  Neck:     Musculoskeletal: Normal range of motion.  Cardiovascular:     Rate and Rhythm: Regular rhythm. Tachycardia present.     Heart sounds: Normal heart sounds.     Comments: HR 120-130s. 1+ radial and DP pulses bilaterally. No LE edema or  calf tenderness.  Pulmonary:     Effort: Pulmonary effort is normal.     Breath sounds: Decreased breath sounds present.     Comments: Diminished breath sounds to lower lobes. No crackles, wheezing. SpO2 fluctuating 90-92% on RA, brief drop to 89-90%.   Abdominal:     General: Bowel sounds are normal.     Palpations: Abdomen is soft.     Tenderness: There is no abdominal tenderness.     Comments: No G/R/R. No suprapubic or CVA tenderness.   Musculoskeletal: Normal range of motion.  Skin:    General: Skin is warm and dry.     Capillary Refill: Capillary refill takes less than 2 seconds.  Neurological:     Mental Status: He is alert and oriented to person, place,  and time.  Psychiatric:        Behavior: Behavior normal.        Thought Content: Thought content normal.        Judgment: Judgment normal.      ED Treatments / Results  Labs (all labs ordered are listed, but only abnormal results are displayed) Labs Reviewed  COMPREHENSIVE METABOLIC PANEL - Abnormal; Notable for the following components:      Result Value   Glucose, Bld 129 (*)    Calcium 8.8 (*)    Total Bilirubin 1.5 (*)    All other components within normal limits  CBC - Abnormal; Notable for the following components:   WBC 13.1 (*)    RBC 3.03 (*)    Hemoglobin 10.1 (*)    HCT 30.6 (*)    MCV 101.0 (*)    RDW 15.6 (*)    All other components within normal limits  CULTURE, BLOOD (ROUTINE X 2)  CULTURE, BLOOD (ROUTINE X 2)  URINE CULTURE  LIPASE, BLOOD  LACTIC ACID, PLASMA  URINALYSIS, ROUTINE W REFLEX MICROSCOPIC  HIV ANTIBODY (ROUTINE TESTING W REFLEX)  COMPREHENSIVE METABOLIC PANEL  CBC WITH DIFFERENTIAL/PLATELET  INFLUENZA PANEL BY PCR (TYPE A & B)    EKG EKG Interpretation  Date/Time:  Wednesday January 09 2019 22:12:33 EDT Ventricular Rate:  114 PR Interval:    QRS Duration: 97 QT Interval:  327 QTC Calculation: 451 R Axis:   42 Text Interpretation:  Sinus tachycardia Nonspecific repol  abnormality, diffuse leads No old tracing to compare Confirmed by Virgel Manifold [phone]) on 01/09/2019 10:45:49 PM   Radiology Dg Chest Port 1 View  Result Date: 01/09/2019 CLINICAL DATA:  68 year old male with vomiting, fever chills. Multiple myeloma patient on chemotherapy. EXAM: PORTABLE CHEST 1 VIEW COMPARISON:  Skeletal survey 10/02/2018. FINDINGS: Portable AP semi upright view at 2104 hours. Patchy bibasilar pulmonary opacity is new since 2019. Lung volumes and mediastinal contours are stable and within normal limits. No pneumothorax or pulmonary edema. No pleural effusion identified. Heterogeneous bone mineralization in some areas. Chronic appearing right 6th rib fracture(s). IMPRESSION: Patchy bibasilar pulmonary opacity is nonspecific suspicious for bilateral aspiration or pneumonia in this clinical setting. Confirmation on PA and lateral views may be valuable when possible. Electronically Signed   By: Genevie Ann M.D.   On: 01/09/2019 21:21    Procedures .Critical Care Performed by: Kinnie Feil, PA-C Authorized by: Kinnie Feil, PA-C   Critical care provider statement:    Critical care time (minutes):  45   Critical care was necessary to treat or prevent imminent or life-threatening deterioration of the following conditions: SIRS.   Critical care was time spent personally by me on the following activities:  Discussions with consultants, evaluation of patient's response to treatment, examination of patient, ordering and performing treatments and interventions, ordering and review of laboratory studies, ordering and review of radiographic studies, pulse oximetry, re-evaluation of patient's condition, obtaining history from patient or surrogate and review of old charts   I assumed direction of critical care for this patient from another provider in my specialty: no     (including critical care time)  Medications Ordered in ED Medications  ondansetron (ZOFRAN-ODT) disintegrating  tablet 4 mg (has no administration in time range)  metroNIDAZOLE (FLAGYL) IVPB 500 mg (500 mg Intravenous New Bag/Given 01/09/19 2242)  vancomycin (VANCOCIN) 2,000 mg in sodium chloride 0.9 % 500 mL IVPB (has no administration in time range)  aspirin EC tablet 81 mg (  has no administration in time range)  fentaNYL (DURAGESIC) 25 MCG/HR 1 patch (has no administration in time range)  oxyCODONE (Oxy IR/ROXICODONE) immediate release tablet 5-10 mg (has no administration in time range)  acyclovir (ZOVIRAX) tablet 400 mg (has no administration in time range)  atorvastatin (LIPITOR) tablet 20 mg (has no administration in time range)  pantoprazole (PROTONIX) EC tablet 40 mg (has no administration in time range)  polyethylene glycol (MIRALAX / GLYCOLAX) packet 17 g (has no administration in time range)  senna-docusate (Senokot-S) tablet 1 tablet (has no administration in time range)  enoxaparin (LOVENOX) injection 40 mg (has no administration in time range)  acetaminophen (TYLENOL) tablet 650 mg (has no administration in time range)    Or  acetaminophen (TYLENOL) suppository 650 mg (has no administration in time range)  ondansetron (ZOFRAN) tablet 4 mg (has no administration in time range)    Or  ondansetron (ZOFRAN) injection 4 mg (has no administration in time range)  metroNIDAZOLE (FLAGYL) IVPB 500 mg (has no administration in time range)  sodium chloride flush (NS) 0.9 % injection 3 mL (3 mLs Intravenous Given 01/09/19 2125)  acetaminophen (TYLENOL) tablet 650 mg (650 mg Oral Given 01/09/19 2209)  sodium chloride 0.9 % bolus 500 mL (0 mLs Intravenous Stopped 01/09/19 2239)  ceFEPIme (MAXIPIME) 2 g in sodium chloride 0.9 % 100 mL IVPB (0 g Intravenous Stopped 01/09/19 2239)     Initial Impression / Assessment and Plan / ED Course  I have reviewed the triage vital signs and the nursing notes.  Pertinent labs & imaging results that were available during my care of the patient were reviewed by me and  considered in my medical decision making (see chart for details).  Clinical Course as of Jan 08 2302  Wed Jan 09, 2019  2143 WBC(!): 13.1 [CG]  2143 FINDINGS: Portable AP semi upright view at 2104 hours. Patchy bibasilar pulmonary opacity is new since 2019. Lung volumes and mediastinal contours are stable and within normal limits. No pneumothorax or pulmonary edema. No pleural effusion identified. Heterogeneous bone mineralization in some areas. Chronic appearing right 6th rib fracture(s).  IMPRESSION: Patchy bibasilar pulmonary opacity is nonspecific suspicious for bilateral aspiration or pneumonia in this clinical setting. Confirmation on PA and lateral views may be valuable when possible.  DG Chest Port 1 View [CG]  2143 WBC(!): 13.1 [CG]  2144 Hemoglobin(!): 10.1 [CG]    Clinical Course User Index [CG] Kinnie Feil, PA-C       SIRS criteria met with temperature 101.6, HR 116, WBC 13.1.  CXR suggest pulmonary source.  Normal lactic acid, normotensive.  No other signs of end organ failure.  He received APAP, broad spectrum abx in ED.  Given diminished lung sounds and transient drop in SpO2 only 500 cc IVF initially given.  His respiratory status has not declined and can now consider more IVF. I ambulated pt and SpO2 maintained > 92% and he denied CP, SOB, light-headedness but HR increased to 130 so he may benefit from gentle hydration.  Last Echo with normal LVEF.  Given SIRS response, h/o cancer, I think pt will benefit from observation vs short admission.  No recent travel, exposure to confirmed COVID 19 patient.  Otherwise denies influenza like symptoms and will defer testing for this today.  Discussed with Dr Myna Hidalgo who has accepted patient. Discussed with EDMD.   Final Clinical Impressions(s) / ED Diagnoses   Final diagnoses:  Fever and chills  History of cancer  SIRS (systemic  inflammatory response syndrome) [facility])    ED Discharge Orders    None       Arlean Hopping 01/09/19 2303    Virgel Manifold, MD 01/10/19 2002

## 2019-01-09 NOTE — Progress Notes (Signed)
Patient inquiring if he needs to take the dexamethasone day after treatment of Kyprolis only.  Nurse reviewed with MD, Dexamethasone only after Daratumumab.  Nurse notified patient via phone after discharge.

## 2019-01-09 NOTE — Progress Notes (Signed)
A consult was received from an ED physician for Vancomycin, cefepime per pharmacy dosing.  The patient's profile has been reviewed for ht/wt/allergies/indication/available labs.   A one time order has been placed for Vancomycin 2gm iv x1, then Cefepime 2gm iv x1.  Further antibiotics/pharmacy consults should be ordered by admitting physician if indicated.                       Thank you, Nani Skillern Crowford 01/09/2019  9:59 PM

## 2019-01-10 ENCOUNTER — Encounter: Payer: Medicare Other | Admitting: Physical Therapy

## 2019-01-10 DIAGNOSIS — D72829 Elevated white blood cell count, unspecified: Secondary | ICD-10-CM

## 2019-01-10 DIAGNOSIS — R200 Anesthesia of skin: Secondary | ICD-10-CM

## 2019-01-10 DIAGNOSIS — R112 Nausea with vomiting, unspecified: Secondary | ICD-10-CM | POA: Diagnosis not present

## 2019-01-10 DIAGNOSIS — C9000 Multiple myeloma not having achieved remission: Secondary | ICD-10-CM | POA: Diagnosis not present

## 2019-01-10 DIAGNOSIS — R509 Fever, unspecified: Secondary | ICD-10-CM | POA: Diagnosis not present

## 2019-01-10 DIAGNOSIS — R6510 Systemic inflammatory response syndrome (SIRS) of non-infectious origin without acute organ dysfunction: Secondary | ICD-10-CM | POA: Diagnosis not present

## 2019-01-10 DIAGNOSIS — R109 Unspecified abdominal pain: Secondary | ICD-10-CM | POA: Diagnosis not present

## 2019-01-10 DIAGNOSIS — Z8579 Personal history of other malignant neoplasms of lymphoid, hematopoietic and related tissues: Secondary | ICD-10-CM

## 2019-01-10 LAB — URINE CULTURE: Culture: NO GROWTH

## 2019-01-10 LAB — COMPREHENSIVE METABOLIC PANEL
ALT: 12 U/L (ref 0–44)
AST: 12 U/L — ABNORMAL LOW (ref 15–41)
Albumin: 2.8 g/dL — ABNORMAL LOW (ref 3.5–5.0)
Alkaline Phosphatase: 72 U/L (ref 38–126)
Anion gap: 4 — ABNORMAL LOW (ref 5–15)
BUN: 15 mg/dL (ref 8–23)
CO2: 25 mmol/L (ref 22–32)
Calcium: 8.1 mg/dL — ABNORMAL LOW (ref 8.9–10.3)
Chloride: 109 mmol/L (ref 98–111)
Creatinine, Ser: 0.9 mg/dL (ref 0.61–1.24)
GFR calc Af Amer: >60 mL/min (ref >60)
GFR calc non Af Amer: >60 mL/min (ref >60)
Glucose, Bld: 108 mg/dL — ABNORMAL HIGH (ref 70–99)
Potassium: 4.1 mmol/L (ref 3.5–5.1)
Sodium: 138 mmol/L (ref 135–145)
TOTAL PROTEIN: 5.4 g/dL — AB (ref 6.5–8.1)
Total Bilirubin: 1.2 mg/dL (ref 0.3–1.2)

## 2019-01-10 LAB — MULTIPLE MYELOMA PANEL, SERUM
ALBUMIN SERPL ELPH-MCNC: 3.2 g/dL (ref 2.9–4.4)
ALPHA 1: 0.2 g/dL (ref 0.0–0.4)
Albumin/Glob SerPl: 1.2 (ref 0.7–1.7)
Alpha2 Glob SerPl Elph-Mcnc: 0.7 g/dL (ref 0.4–1.0)
B-Globulin SerPl Elph-Mcnc: 0.8 g/dL (ref 0.7–1.3)
Gamma Glob SerPl Elph-Mcnc: 1 g/dL (ref 0.4–1.8)
Globulin, Total: 2.8 g/dL (ref 2.2–3.9)
IgA: 9 mg/dL — ABNORMAL LOW (ref 61–437)
IgG (Immunoglobin G), Serum: 1166 mg/dL (ref 700–1600)
IgM (Immunoglobulin M), Srm: 9 mg/dL — ABNORMAL LOW (ref 20–172)
M Protein SerPl Elph-Mcnc: 0.9 g/dL — ABNORMAL HIGH
Total Protein ELP: 6 g/dL (ref 6.0–8.5)

## 2019-01-10 LAB — RESPIRATORY PANEL BY PCR
Adenovirus: NOT DETECTED
Bordetella pertussis: NOT DETECTED
Chlamydophila pneumoniae: NOT DETECTED
Coronavirus 229E: NOT DETECTED
Coronavirus HKU1: NOT DETECTED
Coronavirus NL63: NOT DETECTED
Coronavirus OC43: NOT DETECTED
Influenza A: NOT DETECTED
Influenza B: NOT DETECTED
Metapneumovirus: NOT DETECTED
Mycoplasma pneumoniae: NOT DETECTED
PARAINFLUENZA VIRUS 1-RVPPCR: NOT DETECTED
PARAINFLUENZA VIRUS 2-RVPPCR: NOT DETECTED
Parainfluenza Virus 3: NOT DETECTED
Parainfluenza Virus 4: NOT DETECTED
Respiratory Syncytial Virus: NOT DETECTED
Rhinovirus / Enterovirus: NOT DETECTED

## 2019-01-10 LAB — CBC WITH DIFFERENTIAL/PLATELET
Abs Immature Granulocytes: 0.08 10*3/uL — ABNORMAL HIGH (ref 0.00–0.07)
BASOS PCT: 0 %
Basophils Absolute: 0 10*3/uL (ref 0.0–0.1)
Eosinophils Absolute: 0 10*3/uL (ref 0.0–0.5)
Eosinophils Relative: 0 %
HCT: 26 % — ABNORMAL LOW (ref 39.0–52.0)
Hemoglobin: 8.4 g/dL — ABNORMAL LOW (ref 13.0–17.0)
Immature Granulocytes: 1 %
Lymphocytes Relative: 2 %
Lymphs Abs: 0.3 10*3/uL — ABNORMAL LOW (ref 0.7–4.0)
MCH: 33.5 pg (ref 26.0–34.0)
MCHC: 32.3 g/dL (ref 30.0–36.0)
MCV: 103.6 fL — ABNORMAL HIGH (ref 80.0–100.0)
Monocytes Absolute: 0.5 10*3/uL (ref 0.1–1.0)
Monocytes Relative: 4 %
NEUTROS ABS: 11 10*3/uL — AB (ref 1.7–7.7)
Neutrophils Relative %: 93 %
Platelets: 132 10*3/uL — ABNORMAL LOW (ref 150–400)
RBC: 2.51 MIL/uL — ABNORMAL LOW (ref 4.22–5.81)
RDW: 15.9 % — ABNORMAL HIGH (ref 11.5–15.5)
WBC: 11.9 10*3/uL — ABNORMAL HIGH (ref 4.0–10.5)
nRBC: 0 % (ref 0.0–0.2)

## 2019-01-10 LAB — URINALYSIS, ROUTINE W REFLEX MICROSCOPIC
Bilirubin Urine: NEGATIVE
Glucose, UA: NEGATIVE mg/dL
Hgb urine dipstick: NEGATIVE
Ketones, ur: 5 mg/dL — AB
NITRITE: NEGATIVE
Protein, ur: 30 mg/dL — AB
Specific Gravity, Urine: 1.019 (ref 1.005–1.030)
pH: 5 (ref 5.0–8.0)

## 2019-01-10 LAB — INFLUENZA PANEL BY PCR (TYPE A & B)
Influenza A By PCR: NEGATIVE
Influenza B By PCR: NEGATIVE

## 2019-01-10 LAB — HIV ANTIBODY (ROUTINE TESTING W REFLEX): HIV Screen 4th Generation wRfx: NONREACTIVE

## 2019-01-10 MED ORDER — SODIUM CHLORIDE 0.9 % IV SOLN
2.0000 g | Freq: Three times a day (TID) | INTRAVENOUS | Status: DC
  Administered 2019-01-10: 2 g via INTRAVENOUS
  Filled 2019-01-10 (×3): qty 2

## 2019-01-10 MED ORDER — AMOXICILLIN-POT CLAVULANATE 875-125 MG PO TABS: 1.0000 | ORAL_TABLET | Freq: Two times a day (BID) | ORAL | 0 refills | Status: AC

## 2019-01-10 MED ORDER — VANCOMYCIN HCL 10 G IV SOLR
1500.0000 mg | Freq: Two times a day (BID) | INTRAVENOUS | Status: DC
  Administered 2019-01-10: 1500 mg via INTRAVENOUS
  Filled 2019-01-10: qty 1500

## 2019-01-10 MED ORDER — SODIUM CHLORIDE 0.9 % IV SOLN
INTRAVENOUS | Status: DC | PRN
  Administered 2019-01-10: 500 mL via INTRAVENOUS

## 2019-01-10 NOTE — Progress Notes (Signed)
Speaking with RN that PT is rule out COVID 19- cannot utilize CPAP for OSA until determined.

## 2019-01-10 NOTE — Progress Notes (Signed)
Pharmacy Antibiotic Note  Michael [name] is a 68 y.o. male admitted on 01/09/2019 with sepsis.  Pharmacy has been consulted for Vancomycin, cefepime dosing.  Plan: Vancomycin 2gm iv x1, then Vancomycin 1500 mg IV Q 12 hrs. Goal AUC 400-550. Expected AUC: 524 SCr used: 0.9  Cefepime 2gm iv x1, then 2gm iv q8hr   Height: 6\' 1"  (185.4 cm) Weight: 224 lb 1.6 oz (101.7 kg) IBW/kg (Calculated) : 79.9  Temp (24hrs), Avg:100.1 F (37.8 C), Min:99.4 F (37.4 C), Max:101.6 F (38.7 C)  Recent Labs  Lab 01/09/19 0812 01/09/19 2123 01/10/19 0451  WBC 3.8* 13.1* 11.9*  CREATININE 0.71 0.78 0.90  LATICACIDVEN  --  1.7  --     Estimated Creatinine Clearance: 98.4 mL/min (by C-G formula based on SCr of 0.9 mg/dL).    No Known Allergies  Antimicrobials this admission: Vancomycin 01/09/2019 >> Cefepime 01/09/2019 >>   Dose adjustments this admission: -  Microbiology results: -  Thank you for allowing pharmacy to be a part of this patient's care.  Nani Skillern Crowford 01/10/2019 5:54 AM

## 2019-01-10 NOTE — Consult Note (Addendum)
[facility]    Date of Admission:  01/09/2019   Total days of antibiotics:         1 vanco/cefepime               Reason for Consult: aspiration pneumonia    Referring Provider: Florene Glen   Assessment: Aspiration Pna Multiple Myeloma  Plan: 1. Would add anaerobic coverage- home with augmentin 2. F/u BCx, UCx 3. Discussed with Dr Florene Glen, most likely for d/c today 4. I have very low suspicion for COVID in this patient. Agree with d/c and f/u of tests as outpt. If tested for COVID he will need to home isolate.    Thank you so much for this interesting consult,  Principal Problem:   SIRS (systemic inflammatory response syndrome) (HCC) Active Problems:   Multiple myeloma (HCC)   Chronic pain   . acyclovir  400 mg Oral BID  . aspirin EC  81 mg Oral Daily  . atorvastatin  20 mg Oral q1800  . enoxaparin (LOVENOX) injection  40 mg Subcutaneous QHS  . fentaNYL  1 patch Transdermal Q72H  . pantoprazole  40 mg Oral Daily  . polyethylene glycol  17 g Oral Daily  . senna-docusate  1 tablet Oral BID    HPI: Michael [name] is a 68 y.o. male with hx of Multiple Myeloma (dx 2012), who received CTX (Kyprolis)3-25 then developed fever, chillls and n/v. He does not have a port. He came to hospital and was found on CXR to have "Patchy bibasilar pulmonary opacity is nonspecific suspicious for bilateral aspiration or pneumonia in this clinical setting." He was also noted to have leukocytosis. He was started on vanco/cefepime.  Today he feels near baseline, no further f/c, no n/v.   No hx of travel or sick contacts. Has been at home, with only contacts being his wife, sone and grandson. He has only been to cancer center.   Review of Systems: Review of Systems  Constitutional: Positive for chills and fever.  HENT: Negative for sore throat.   Respiratory: Negative for cough and shortness of breath.   Gastrointestinal: Positive for abdominal pain, nausea and  vomiting. Negative for constipation and diarrhea.  Genitourinary: Negative for dysuria.  Please see HPI. All other systems reviewed and negative.   Past Medical History:  Diagnosis Date  . Angioedema   . CPAP (continuous positive airway pressure) dependence    uses at night  . Gastroesophageal reflux disease without esophagitis   . History of radiation therapy 06/04/18- 06/15/18   25 Gy in 10 fractions to the laryngeal/ cartilage lesion  . Hyperlipidemia   . Multiple myeloma ([place]) 2012   Treated at [facility]  . Non morbid obesity due to excess calories   . Prediabetes   . Prediabetes   . Unspecified glaucoma(365.9)   . Urticaria, unspecified     Social History   Tobacco Use  . Smoking status: Never Smoker  . Smokeless tobacco: Never Used  Substance Use Topics  . Alcohol use: Yes    Alcohol/week: 0.0 standard drinks    Comment: Occasional beer  . Drug use: No    Family History  Problem Relation Age of Onset  . Cancer Father      Medications:  Scheduled: . acyclovir  400 mg Oral BID  . aspirin EC  81 mg Oral Daily  . atorvastatin  20 mg Oral q1800  . enoxaparin (LOVENOX) injection  40 mg Subcutaneous  QHS  . fentaNYL  1 patch Transdermal Q72H  . pantoprazole  40 mg Oral Daily  . polyethylene glycol  17 g Oral Daily  . senna-docusate  1 tablet Oral BID    Abtx:  Anti-infectives (From admission, onward)   Start     Dose/Rate Route Frequency Ordered Stop   01/10/19 1200  vancomycin (VANCOCIN) 1,500 mg in sodium chloride 0.9 % 500 mL IVPB     1,500 mg 250 mL/hr over 120 Minutes Intravenous Every 12 hours 01/10/19 0554     01/10/19 1000  acyclovir (ZOVIRAX) tablet 400 mg     400 mg Oral 2 times daily 01/09/19 2240     01/10/19 0600  metroNIDAZOLE (FLAGYL) IVPB 500 mg  Status:  Discontinued     500 mg 100 mL/hr over 60 Minutes Intravenous Every 8 hours 01/09/19 2240 01/10/19 1031   01/10/19 0600  ceFEPIme (MAXIPIME) 2 g in sodium chloride 0.9 % 100 mL IVPB     2 g 200  mL/hr over 30 Minutes Intravenous Every 8 hours 01/10/19 0554     01/09/19 2200  ceFEPIme (MAXIPIME) 2 g in sodium chloride 0.9 % 100 mL IVPB     2 g 200 mL/hr over 30 Minutes Intravenous  Once 01/09/19 2145 01/09/19 2239   01/09/19 2200  metroNIDAZOLE (FLAGYL) IVPB 500 mg     500 mg 100 mL/hr over 60 Minutes Intravenous  Once 01/09/19 2145 01/09/19 2342   01/09/19 2200  vancomycin (VANCOCIN) IVPB 1000 mg/200 mL premix  Status:  Discontinued     1,000 mg 200 mL/hr over 60 Minutes Intravenous  Once 01/09/19 2145 01/09/19 2154   01/09/19 2200  vancomycin (VANCOCIN) 2,000 mg in sodium chloride 0.9 % 500 mL IVPB     2,000 mg 250 mL/hr over 120 Minutes Intravenous  Once 01/09/19 2154 01/10/19 0300        OBJECTIVE: Blood pressure 114/69, pulse 78, temperature 98.4 F (36.9 C), temperature source Oral, resp. rate 18, height 6' 1"  (1.854 m), weight 101.7 kg, SpO2 96 %.  Physical Exam Constitutional:      Appearance: Normal appearance.  HENT:     Mouth/Throat:     Mouth: Mucous membranes are moist.     Pharynx: No oropharyngeal exudate.  Eyes:     Extraocular Movements: Extraocular movements intact.     Pupils: Pupils are equal, round, and reactive to light.  Neck:     Musculoskeletal: Normal range of motion and neck supple. No neck rigidity.  Cardiovascular:     Rate and Rhythm: Normal rate and regular rhythm.  Pulmonary:     Effort: Pulmonary effort is normal.     Breath sounds: Normal breath sounds.  Abdominal:     General: Bowel sounds are normal. There is no distension.     Palpations: Abdomen is soft.     Tenderness: There is no abdominal tenderness.  Musculoskeletal:     Right lower leg: No edema.     Left lower leg: No edema.  Neurological:     General: No focal deficit present.     Mental Status: He is alert and oriented to person, place, and time.     Sensory: Sensory deficit present.  Psychiatric:        Mood and Affect: Mood normal.    Numbness R great toe.  Old.  Lab Results Results for orders placed or performed during the hospital encounter of 01/09/19 (from the past 48 hour(s))  Lipase, blood  Status: None   Collection Time: 01/09/19  9:23 PM  Result Value Ref Range   Lipase 27 11 - 51 U/L    Comment: Performed at [facility], [place] [address], [place], [place] [address]  Comprehensive metabolic panel     Status: Abnormal   Collection Time: 01/09/19  9:23 PM  Result Value Ref Range   Sodium 142 135 - 145 mmol/L   Potassium 3.7 3.5 - 5.1 mmol/L   Chloride 108 98 - 111 mmol/L   CO2 25 22 - 32 mmol/L   Glucose, Bld 129 (H) 70 - 99 mg/dL   BUN 11 8 - 23 mg/dL   Creatinine, Ser 0.78 0.61 - 1.24 mg/dL   Calcium 8.8 (L) 8.9 - 10.3 mg/dL   Total Protein 6.5 6.5 - 8.1 g/dL   Albumin 3.5 3.5 - 5.0 g/dL   AST 15 15 - 41 U/L   ALT 15 0 - 44 U/L   Alkaline Phosphatase 93 38 - 126 U/L   Total Bilirubin 1.5 (H) 0.3 - 1.2 mg/dL   GFR calc non Af Amer >60 >60 mL/min   GFR calc Af Amer >60 >60 mL/min   Anion gap 9 5 - 15    Comment: Performed at [facility], [place] [address], [place], [place] [address]  CBC     Status: Abnormal   Collection Time: 01/09/19  9:23 PM  Result Value Ref Range   WBC 13.1 (H) 4.0 - 10.5 K/uL   RBC 3.03 (L) 4.22 - 5.81 MIL/uL   Hemoglobin 10.1 (L) 13.0 - 17.0 g/dL   HCT 30.6 (L) 39.0 - 52.0 %   MCV 101.0 (H) 80.0 - 100.0 fL   MCH 33.3 26.0 - 34.0 pg   MCHC 33.0 30.0 - 36.0 g/dL   RDW 15.6 (H) 11.5 - 15.5 %   Platelets 160 150 - 400 K/uL   nRBC 0.0 0.0 - 0.2 %    Comment: Performed at [facility], [place] [address], [place], [place] [address]  Urinalysis, Routine w reflex microscopic     Status: Abnormal   Collection Time: 01/09/19  9:23 PM  Result Value Ref Range   Color, Urine YELLOW YELLOW   APPearance HAZY (A) CLEAR   Specific Gravity, Urine 1.019 1.005 - 1.030   pH 5.0 5.0 - 8.0   Glucose, UA NEGATIVE NEGATIVE mg/dL   Hgb urine dipstick NEGATIVE  NEGATIVE   Bilirubin Urine NEGATIVE NEGATIVE   Ketones, ur 5 (A) NEGATIVE mg/dL   Protein, ur 30 (A) NEGATIVE mg/dL   Nitrite NEGATIVE NEGATIVE   Leukocytes,Ua SMALL (A) NEGATIVE   RBC / HPF 0-5 0 - 5 RBC/hpf   WBC, UA 11-20 0 - 5 WBC/hpf   Bacteria, UA RARE (A) NONE SEEN   Squamous Epithelial / LPF 0-5 0 - 5   Mucus PRESENT     Comment: Performed at [facility], [place] [address], [place], [place] [address]  Lactic acid, plasma     Status: None   Collection Time: 01/09/19  9:23 PM  Result Value Ref Range   Lactic Acid, Venous 1.7 0.5 - 1.9 mmol/L    Comment: Performed at [facility], [place] [address] [place], [place] [address]  Influenza panel by PCR (type A & B)     Status: None   Collection Time: 01/10/19 12:03 AM  Result Value Ref Range   Influenza A By PCR NEGATIVE NEGATIVE   Influenza B  By PCR NEGATIVE NEGATIVE    Comment: (NOTE) The Xpert Xpress Flu assay is intended as an aid in the diagnosis of  influenza and should not be used as a sole basis for treatment.  This  assay is FDA approved for nasopharyngeal swab specimens only. Nasal  washings and aspirates are unacceptable for Xpert Xpress Flu testing. Performed at [facility], [place] [address], [place], [place] [address]   Comprehensive metabolic panel     Status: Abnormal   Collection Time: 01/10/19  4:51 AM  Result Value Ref Range   Sodium 138 135 - 145 mmol/L   Potassium 4.1 3.5 - 5.1 mmol/L   Chloride 109 98 - 111 mmol/L   CO2 25 22 - 32 mmol/L   Glucose, Bld 108 (H) 70 - 99 mg/dL   BUN 15 8 - 23 mg/dL   Creatinine, Ser 0.90 0.61 - 1.24 mg/dL   Calcium 8.1 (L) 8.9 - 10.3 mg/dL   Total Protein 5.4 (L) 6.5 - 8.1 g/dL   Albumin 2.8 (L) 3.5 - 5.0 g/dL   AST 12 (L) 15 - 41 U/L   ALT 12 0 - 44 U/L   Alkaline Phosphatase 72 38 - 126 U/L   Total Bilirubin 1.2 0.3 - 1.2 mg/dL   GFR calc non Af Amer >60 >60 mL/min   GFR calc Af Amer >60 >60 mL/min   Anion gap 4  (L) 5 - 15    Comment: Performed at [facility], [place] [address] [place], [place] [address]  CBC WITH DIFFERENTIAL     Status: Abnormal   Collection Time: 01/10/19  4:51 AM  Result Value Ref Range   WBC 11.9 (H) 4.0 - 10.5 K/uL   RBC 2.51 (L) 4.22 - 5.81 MIL/uL   Hemoglobin 8.4 (L) 13.0 - 17.0 g/dL   HCT 26.0 (L) 39.0 - 52.0 %   MCV 103.6 (H) 80.0 - 100.0 fL   MCH 33.5 26.0 - 34.0 pg   MCHC 32.3 30.0 - 36.0 g/dL   RDW 15.9 (H) 11.5 - 15.5 %   Platelets 132 (L) 150 - 400 K/uL    Comment: Immature Platelet Fraction may be clinically indicated, consider ordering this additional test [id number]    nRBC 0.0 0.0 - 0.2 %   Neutrophils Relative % 93 %   Neutro Abs 11.0 (H) 1.7 - 7.7 K/uL   Lymphocytes Relative 2 %   Lymphs Abs 0.3 (L) 0.7 - 4.0 K/uL   Monocytes Relative 4 %   Monocytes Absolute 0.5 0.1 - 1.0 K/uL   Eosinophils Relative 0 %   Eosinophils Absolute 0.0 0.0 - 0.5 K/uL   Basophils Relative 0 %   Basophils Absolute 0.0 0.0 - 0.1 K/uL   Immature Granulocytes 1 %   Abs Immature Granulocytes 0.08 (H) 0.00 - 0.07 K/uL    Comment: Performed at [facility], [place] [address], [place], [place] [address]   No results found for: SDES, SPECREQUEST, CULT, REPTSTATUS Dg Chest [facility] 1 View  Result Date: 01/09/2019 CLINICAL DATA:  68 year old male with vomiting, fever chills. Multiple myeloma patient on chemotherapy. EXAM: PORTABLE CHEST 1 VIEW COMPARISON:  Skeletal survey 10/02/2018. FINDINGS: Portable AP semi upright view at 2104 hours. Patchy bibasilar pulmonary opacity is new since 2019. Lung volumes and mediastinal contours are stable and within normal limits. No pneumothorax or pulmonary edema. No pleural effusion identified. Heterogeneous bone mineralization in some areas. Chronic appearing right 6th rib fracture(s). IMPRESSION: Patchy bibasilar  pulmonary opacity is nonspecific suspicious for bilateral aspiration or pneumonia in this clinical  setting. Confirmation on PA and lateral views may be valuable when possible. Electronically Signed   By: Genevie Ann M.D.   On: 01/09/2019 21:21   No results found for this or any previous visit (from the past 240 hour(s)).  Microbiology: No results found for this or any previous visit (from the past 240 hour(s)).  Radiographs and labs were personally reviewed by me.   Bobby Rumpf, MD [facility] [phone] 01/10/2019, 10:55 AM

## 2019-01-10 NOTE — Discharge Summary (Signed)
Physician Discharge Summary  Michael [name] OIN:[id number] DOB: 08/01/1951 DOA: 01/09/2019  PCP: [name], MD  Admit date: 01/09/2019 Discharge date: 01/10/2019  Time spent: 40 minutes  Recommendations for Outpatient Follow-up:  1. Follow outpatient CBC/CMP 2. Follow pending COVID testing.  Pt given guidelines for self isolation, avoid NSAIDs.  3. Home isolation until 7 days after sx started AND 3 days without fever (without use of antipyretics)  4. Follow pending cultures - pending blood and urine cultures.  5. Follow up with outpatient oncology 6. Infection Prevention notified of patient and aware of pending covid testing 7. Follow repeat CXR as outpatient   Discharge Diagnoses:  Principal Problem:   SIRS (systemic inflammatory response syndrome) (HCC) Active Problems:   Multiple myeloma (HCC)   Chronic pain   Discharge Condition: stable  Diet recommendation: as tolerated  Filed Weights   01/09/19 2020 01/10/19 0628  Weight: 101.7 kg 101.7 kg    History of present illness:  Michael [name] is a 68 y.o. male with medical history significant for multiple myeloma, OSA on CPAP, and GERD, now presenting to the emergency department for evaluation of chills and nausea with one episode of vomiting.  Patient reports that he had been in his usual state of health and was having an uneventful day until this afternoon when he developed nausea and had one episode of vomiting.  He developed chills around the same time.  He denies any cough, shortness of breath, rhinorrhea, sore throat, dysuria, or flank pain.  He denies any neck stiffness or headache.  He denies abdominal pain per se, but reports some chronic abdominal discomfort that is unchanged.  He denies any rash or wounds.  He has not traveled anywhere recently, has been around his son and grandchildren who have not been ill and have not traveled anywhere recently either.  The patient was admitted with fevers, chills, nausea, and  vomiting.  He had CXR findings with bilateral opacities concerning for pneumonia.  On hospital day 1 he was back to his baseline and feeling well.  He was considered low risk for covid.  Testing was performed in setting of wife with "bronchitis" and CXR findings.  He was discharged on augmentin.    Hospital Course:  1. SIRS  - Presents with several hours of chills, nausea, and one episode of vomiting  - Found to be febrile and tachycardic with leukocytosis, reassuringly normal lactate, and normal BP  - There is patchy bibasilar opacity on CXR that could reflect aspiration or PNA but patient denies any cough or SOB, is not hypoxic, and lungs are CTAB   - In terms of COVID, there is no travel, exposure to known case, or any respiratory s/s.  His wife has "bronchitis" for the past week.  COVID testing performed, pt considered "low risk" given his lack of O2 requirement.  Gown, gloves, mask and faceshield worn when in the room with pt today.  - urine and blood cx pending at discharge - RVP pending as well - discharged on augmentin - ID saw pt as well, see c/s note. - discussed home isolation given COVID testing.  Return precautions.  Avoiding NSAIDS.  2. Multiple myeloma  - Follow with oncology and undergoing treatment with Dr. Irene Limbo (aware of admission, discussed likely d/c and need for follow up - requested RVP which we added as noted above)  - Continue acyclovir   3. Chronic pain  - No pain complaints on admission  - Continue fentanyl patch and as-needed oxycodone  4. Nausea, vomiting  - resolved  # Anemia  Thrombocytopenia: suspect degree of hemodilution with IVF.  Follow outpatient.    Procedures: none Consultations:  ID  Discharge Exam: Vitals:   01/10/19 0628 01/10/19 1459  BP: 114/69 127/73  Pulse: 78 78  Resp: 18 16  Temp: 98.4 F (36.9 C) 98.7 F (37.1 C)  SpO2: 96% 100%   Feels better.  Back to himself. Notes myeloma related pain.  General: No acute  distress. Cardiovascular: Heart sounds show a regular rate, and rhythm. Lungs: Clear to auscultation bilaterally Abdomen: Soft, nontender, nondistended  Neurological: Alert and oriented 3. Moves all extremities 4. Cranial nerves II through XII grossly intact. Skin: Warm and dry. No rashes or lesions. Extremities: No clubbing or cyanosis. No edema.  Psychiatric: Mood and affect are normal. Insight and judgment are appropriate.  Mask, faceshield, gown, gloves worn at bedside.  Discharge Instructions   Discharge Instructions    Call MD for:  difficulty breathing, headache or visual disturbances   Complete by:  As directed    Call MD for:  extreme fatigue   Complete by:  As directed    Call MD for:  hives   Complete by:  As directed    Call MD for:  persistant dizziness or light-headedness   Complete by:  As directed    Call MD for:  persistant nausea and vomiting   Complete by:  As directed    Call MD for:  redness, tenderness, or signs of infection (pain, swelling, redness, odor or green/yellow discharge around incision site)   Complete by:  As directed    Call MD for:  severe uncontrolled pain   Complete by:  As directed    Call MD for:  temperature >100.4   Complete by:  As directed    Diet - low sodium heart healthy   Complete by:  As directed    Discharge instructions   Complete by:  As directed    You were admitted for fevers, chills, nausea and vomiting.  Your x ray was concerning for pneumonia.  Your symptoms have improved today, so we will discharge you home.  You have pending blood cultures and urine cultures.  We will follow up the results of these and contact you if anything concerning grows.    We will discharge you with augmentin to take for the next 7 days, twice daily.  This will cover pneumonia and other possible infections.  Return for new, recurrent, or worsening symptoms.  We are testing you for COVID 19 because of your symptoms.    You should  undergo home isolation until this test results.  See below for details regarding this, but in brief, when you go home, stay home and separate yourself from others in your home.  Your test should result sometime within the next several days (though it has been taking a while to get results).  You should continue home isolation until 7 days have passed since your symptoms first started and you have been fever free for 3 days (72 hours) without fever (without using tylenol).    Person Under Monitoring Name: Barbara Keng  Location: [address] [place] [address]   Infection Prevention Recommendations for Individuals Confirmed to have, or Being Evaluated for, 2019 Novel Coronavirus (COVID-19) Infection Who Receive Care at Home  Individuals who are confirmed to have, or are being evaluated for, COVID-19 should follow the prevention steps below until a healthcare provider or local or state health  department says they can return to normal activities.  Stay home except to get medical care You should restrict activities outside your home, except for getting medical care. Do not go to work, school, or public areas, and do not use public transportation or taxis.  Call ahead before visiting your doctor Before your medical appointment, call the healthcare provider and tell them that you have, or are being evaluated for, COVID-19 infection. This will help the healthcare provider's office take steps to keep other people from getting infected. Ask your healthcare provider to call the local or state health department.  Monitor your symptoms Seek prompt medical attention if your illness is worsening (e.g., difficulty breathing). Before going to your medical appointment, call the healthcare provider and tell them that you have, or are being evaluated for, COVID-19 infection. Ask your healthcare provider to call the local or state health department.  Wear a facemask You should wear a facemask  that covers your nose and mouth when you are in the same room with other people and when you visit a healthcare provider. People who live with or visit you should also wear a facemask while they are in the same room with you.  Separate yourself from other people in your home As much as possible, you should stay in a different room from other people in your home. Also, you should use a separate bathroom, if available.  Avoid sharing household items You should not share dishes, drinking glasses, cups, eating utensils, towels, bedding, or other items with other people in your home. After using these items, you should wash them thoroughly with soap and water.  Cover your coughs and sneezes Cover your mouth and nose with a tissue when you cough or sneeze, or you can cough or sneeze into your sleeve. Throw used tissues in a lined trash can, and immediately wash your hands with soap and water for at least 20 seconds or use an alcohol-based hand rub.  Wash your [facility] your hands often and thoroughly with soap and water for at least 20 seconds. You can use an alcohol-based hand sanitizer if soap and water are not available and if your hands are not visibly dirty. Avoid touching your eyes, nose, and mouth with unwashed hands.  Prevention Steps for Caregivers and Household Members of Individuals Confirmed to have, or Being Evaluated for, COVID-19 Infection Being Cared for in the Home  If you live with, or provide care at home for, a person confirmed to have, or being evaluated for, COVID-19 infection please follow these guidelines to prevent infection:  Follow healthcare provider's instructions Make sure that you understand and can help the patient follow any healthcare provider instructions for all care.  Provide for the patient's basic needs You should help the patient with basic needs in the home and provide support for getting groceries, prescriptions, and other personal  needs.  Monitor the patient's symptoms If they are getting sicker, call his or her medical provider and tell them that the patient has, or is being evaluated for, COVID-19 infection. This will help the healthcare provider's office take steps to keep other people from getting infected. Ask the healthcare provider to call the local or state health department.  Limit the number of people who have contact with the patient If possible, have only one caregiver for the patient. Other household members should stay in another home or place of residence. If this is not possible, they should stay in another room, or be separated from  the patient as much as possible. Use a separate bathroom, if available. Restrict visitors who do not have an essential need to be in the home.  Keep older adults, very young children, and other sick people away from the patient Keep older adults, very young children, and those who have compromised immune systems or chronic health conditions away from the patient. This includes people with chronic heart, lung, or kidney conditions, diabetes, and cancer.  Ensure good ventilation Make sure that shared spaces in the home have good air flow, such as from an air conditioner or an opened window, weather permitting.  Wash your hands often Wash your hands often and thoroughly with soap and water for at least 20 seconds. You can use an alcohol based hand sanitizer if soap and water are not available and if your hands are not visibly dirty. Avoid touching your eyes, nose, and mouth with unwashed hands. Use disposable paper towels to dry your hands. If not available, use dedicated cloth towels and replace them when they become wet.  Wear a facemask and gloves Wear a disposable facemask at all times in the room and gloves when you touch or have contact with the patient's blood, body fluids, and/or secretions or excretions, such as sweat, saliva, sputum, nasal mucus, vomit, urine, or  feces.  Ensure the mask fits over your nose and mouth tightly, and do not touch it during use. Throw out disposable facemasks and gloves after using them. Do not reuse. Wash your hands immediately after removing your facemask and gloves. If your personal clothing becomes contaminated, carefully remove clothing and launder. Wash your hands after handling contaminated clothing. Place all used disposable facemasks, gloves, and other waste in a lined container before disposing them with other household waste. Remove gloves and wash your hands immediately after handling these items.  Do not share dishes, glasses, or other household items with the patient Avoid sharing household items. You should not share dishes, drinking glasses, cups, eating utensils, towels, bedding, or other items with a patient who is confirmed to have, or being evaluated for, COVID-19 infection. After the person uses these items, you should wash them thoroughly with soap and water.  Wash laundry thoroughly Immediately remove and wash clothes or bedding that have blood, body fluids, and/or secretions or excretions, such as sweat, saliva, sputum, nasal mucus, vomit, urine, or feces, on them. Wear gloves when handling laundry from the patient. Read and follow directions on labels of laundry or clothing items and detergent. In general, wash and dry with the warmest temperatures recommended on the label.  Clean all areas the individual has used often Clean all touchable surfaces, such as counters, tabletops, doorknobs, bathroom fixtures, toilets, phones, keyboards, tablets, and bedside tables, every day. Also, clean any surfaces that may have blood, body fluids, and/or secretions or excretions on them. Wear gloves when cleaning surfaces the patient has come in contact with. Use a diluted bleach solution (e.g., dilute bleach with 1 part bleach and 10 parts water) or a household disinfectant with a label that says EPA-registered for  coronaviruses. To make a bleach solution at home, add 1 tablespoon of bleach to 1 quart (4 cups) of water. For a larger supply, add  cup of bleach to 1 gallon (16 cups) of water. Read labels of cleaning products and follow recommendations provided on product labels. Labels contain instructions for safe and effective use of the cleaning product including precautions you should take when applying the product, such as wearing gloves or  eye protection and making sure you have good ventilation during use of the product. Remove gloves and wash hands immediately after cleaning.  Monitor yourself for signs and symptoms of illness Caregivers and household members are considered close contacts, should monitor their health, and will be asked to limit movement outside of the home to the extent possible. Follow the monitoring steps for close contacts listed on the symptom monitoring form.   ? If you have additional questions, contact your local health department or call the epidemiologist on call at [phone] (available 24/7). ? This guidance is subject to change. For the most up-to-date guidance from [facility], please refer to their website: YouBlogs.pl   Increase activity slowly   Complete by:  As directed      Allergies as of 01/10/2019   No Known Allergies     Medication List    TAKE these medications   acyclovir 400 MG tablet Commonly known as:  ZOVIRAX Take 1 tablet (400 mg total) by mouth 2 (two) times daily.   amoxicillin-clavulanate 875-125 MG tablet Commonly known as:  Augmentin Take 1 tablet by mouth 2 (two) times daily for 7 days.   aspirin 81 MG tablet Take 81 mg by mouth daily.   atorvastatin 20 MG tablet Commonly known as:  LIPITOR Take 20 mg by mouth every evening.   dexamethasone 4 MG tablet Commonly known as:  DECADRON 15m (5 tabs) with breakfast the day after each daratumumab treatment   fentaNYL 25  MCG/HR Commonly known as:  DMono1 patch onto the skin every 3 (three) days.   Lido-Capsaicin-Men-Methyl Sal 0.5-0.035-5-20 % Ptch Commonly known as:  Medi-Patch-Lidocaine Apply 1 patch topically 2 (two) times daily as needed.   MiraLax packet Generic drug:  polyethylene glycol Take 17 g by mouth daily.   multivitamin tablet Take 1 tablet by mouth daily.   Narcan 4 MG/0.1ML Liqd nasal spray kit Generic drug:  naloxone Place 1 spray into the nose once.   nystatin powder Commonly known as:  MYCOSTATIN/NYSTOP Apply 100,000 g topically daily as needed (rash).   ondansetron 8 MG tablet Commonly known as:  Zofran Take 1 tablet (8 mg total) by mouth 2 (two) times daily as needed (Nausea or vomiting).   oxyCODONE 5 MG immediate release tablet Commonly known as:  Oxy IR/ROXICODONE Take 1-2 tablets (5-10 mg total) by mouth every 4 (four) hours as needed for severe pain or breakthrough pain.   pantoprazole 40 MG tablet Commonly known as:  PROTONIX Take 40 mg by mouth daily.   prochlorperazine 10 MG tablet Commonly known as:  COMPAZINE Take 1 tablet (10 mg total) by mouth every 6 (six) hours as needed (Nausea or vomiting).   senna-docusate 8.6-50 MG tablet Commonly known as:  Senokot-S Take 2 tablets by mouth at bedtime. Takes 50 mg What changed:    how much to take  when to take this      No Known Allergies    The results of significant diagnostics from this hospitalization (including imaging, microbiology, ancillary and laboratory) are listed below for reference.    Significant Diagnostic Studies: Dg Chest Port 1 View  Result Date: 01/09/2019 CLINICAL DATA:  68year old male with vomiting, fever chills. Multiple myeloma patient on chemotherapy. EXAM: PORTABLE CHEST 1 VIEW COMPARISON:  Skeletal survey 10/02/2018. FINDINGS: Portable AP semi upright view at 2104 hours. Patchy bibasilar pulmonary opacity is new since 2019. Lung volumes and mediastinal contours  are stable and within normal limits. No pneumothorax or pulmonary edema.  No pleural effusion identified. Heterogeneous bone mineralization in some areas. Chronic appearing right 6th rib fracture(s). IMPRESSION: Patchy bibasilar pulmonary opacity is nonspecific suspicious for bilateral aspiration or pneumonia in this clinical setting. Confirmation on PA and lateral views may be valuable when possible. Electronically Signed   By: Genevie Ann M.D.   On: 01/09/2019 21:21    Microbiology: Recent Results (from the past 240 hour(s))  Respiratory Panel by PCR     Status: None   Collection Time: 01/10/19  1:21 PM  Result Value Ref Range Status   Adenovirus NOT DETECTED NOT DETECTED Final   Coronavirus 229E NOT DETECTED NOT DETECTED Final    Comment: (NOTE) The Coronavirus on the Respiratory Panel, DOES NOT test for the novel  Coronavirus (2019 nCoV)    Coronavirus HKU1 NOT DETECTED NOT DETECTED Final   Coronavirus NL63 NOT DETECTED NOT DETECTED Final   Coronavirus OC43 NOT DETECTED NOT DETECTED Final   Metapneumovirus NOT DETECTED NOT DETECTED Final   Rhinovirus / Enterovirus NOT DETECTED NOT DETECTED Final   Influenza A NOT DETECTED NOT DETECTED Final   Influenza B NOT DETECTED NOT DETECTED Final   Parainfluenza Virus 1 NOT DETECTED NOT DETECTED Final   Parainfluenza Virus 2 NOT DETECTED NOT DETECTED Final   Parainfluenza Virus 3 NOT DETECTED NOT DETECTED Final   Parainfluenza Virus 4 NOT DETECTED NOT DETECTED Final   Respiratory Syncytial Virus NOT DETECTED NOT DETECTED Final   Bordetella pertussis NOT DETECTED NOT DETECTED Final   Chlamydophila pneumoniae NOT DETECTED NOT DETECTED Final   Mycoplasma pneumoniae NOT DETECTED NOT DETECTED Final    Comment: Performed at [facility], [place]. [address] [place], [place] [address]     Labs: Basic Metabolic Panel: Recent Labs  Lab 01/09/19 1191 01/09/19 2123 01/10/19 0451  NA 140 142 138  K 3.7 3.7 4.1  CL 105 108 109  CO2 25 25 25    GLUCOSE 103* 129* 108*  BUN 7* 11 15  CREATININE 0.71 0.78 0.90  CALCIUM 7.9* 8.8* 8.1*   Liver Function Tests: Recent Labs  Lab 01/09/19 0812 01/09/19 2123 01/10/19 0451  AST 12* 15 12*  ALT 16 15 12   ALKPHOS 107 93 72  BILITOT 0.7 1.5* 1.2  PROT 6.6 6.5 5.4*  ALBUMIN 3.4* 3.5 2.8*   Recent Labs  Lab 01/09/19 2123  LIPASE 27   No results for input(s): AMMONIA in the last 168 hours. CBC: Recent Labs  Lab 01/09/19 0812 01/09/19 2123 01/10/19 0451  WBC 3.8* 13.1* 11.9*  NEUTROABS 2.8  --  11.0*  HGB 10.6* 10.1* 8.4*  HCT 32.5* 30.6* 26.0*  MCV 101.2* 101.0* 103.6*  PLT 166 160 132*   Cardiac Enzymes: No results for input(s): CKTOTAL, CKMB, CKMBINDEX, TROPONINI in the last 168 hours. BNP: BNP (last 3 results) No results for input(s): BNP in the last 8760 hours.  ProBNP (last 3 results) No results for input(s): PROBNP in the last 8760 hours.  CBG: No results for input(s): GLUCAP in the last 168 hours.     Signed:  Fayrene Helper MD.  [facility] 01/10/2019, 6:00 PM

## 2019-01-10 NOTE — Discharge Instructions (Signed)
Person Under Monitoring Name: Michael [name]  Location: [address] [place] [address]   Infection Prevention Recommendations for Individuals Confirmed to have, or Being Evaluated for, 2019 Novel Coronavirus (COVID-19) Infection Who Receive Care at Home  Individuals who are confirmed to have, or are being evaluated for, COVID-19 should follow the prevention steps below until Edem Tiegs healthcare provider or local or state health department says they can return to normal activities.  Stay home except to get medical care You should restrict activities outside your home, except for getting medical care. Do not go to work, school, or public areas, and do not use public transportation or taxis.  Call ahead before visiting your doctor Before your medical appointment, call the healthcare provider and tell them that you have, or are being evaluated for, COVID-19 infection. This will help the healthcare providers office take steps to keep other people from getting infected. Ask your healthcare provider to call the local or state health department.  Monitor your symptoms Seek prompt medical attention if your illness is worsening (e.g., difficulty breathing). Before going to your medical appointment, call the healthcare provider and tell them that you have, or are being evaluated for, COVID-19 infection. Ask your healthcare provider to call the local or state health department.  Wear Gatsby Chismar facemask You should wear Lavern Maslow facemask that covers your nose and mouth when you are in the same room with other people and when you visit Ulyana Pitones healthcare provider. People who live with or visit you should also wear Quanta Roher facemask while they are in the same room with you.  Separate yourself from other people in your home As much as possible, you should stay in Kashana Breach different room from other people in your home. Also, you should use Tomeca Helm separate bathroom, if available.  Avoid sharing household items You should not  share dishes, drinking glasses, cups, eating utensils, towels, bedding, or other items with other people in your home. After using these items, you should wash them thoroughly with soap and water.  Cover your coughs and sneezes Cover your mouth and nose with Noya Santarelli tissue when you cough or sneeze, or you can cough or sneeze into your sleeve. Throw used tissues in Gustavo Dispenza lined trash can, and immediately wash your hands with soap and water for at least 20 seconds or use an alcohol-based hand rub.  Wash your [facility] your hands often and thoroughly with soap and water for at least 20 seconds. You can use an alcohol-based hand sanitizer if soap and water are not available and if your hands are not visibly dirty. Avoid touching your eyes, nose, and mouth with unwashed hands.   Prevention Steps for Caregivers and Household Members of Individuals Confirmed to have, or Being Evaluated for, COVID-19 Infection Being Cared for in the Home  If you live with, or provide care at home for, Michael [name] person confirmed to have, or being evaluated for, COVID-19 infection please follow these guidelines to prevent infection:  Follow healthcare providers instructions Make sure that you understand and can help the patient follow any healthcare provider instructions for all care.  Provide for the patients basic needs You should help the patient with basic needs in the home and provide support for getting groceries, prescriptions, and other personal needs.  Monitor the patients symptoms If they are getting sicker, call his or her medical provider and tell them that the patient has, or is being evaluated for, COVID-19 infection. This will help the healthcare providers office take  steps to keep other people from getting infected. Ask the healthcare provider to call the local or state health department.  Limit the number of people who have contact with the patient  If possible, have only one caregiver for the  patient.  Other household members should stay in another home or place of residence. If this is not possible, they should stay  in another room, or be separated from the patient as much as possible. Use Wyatt Thorstenson separate bathroom, if available.  Restrict visitors who do not have an essential need to be in the home.  Keep older adults, very young children, and other sick people away from the patient Keep older adults, very young children, and those who have compromised immune systems or chronic health conditions away from the patient. This includes people with chronic heart, lung, or kidney conditions, diabetes, and cancer.  Ensure good ventilation Make sure that shared spaces in the home have good air flow, such as from an air conditioner or an opened window, weather permitting.  Wash your hands often  Wash your hands often and thoroughly with soap and water for at least 20 seconds. You can use an alcohol based hand sanitizer if soap and water are not available and if your hands are not visibly dirty.  Avoid touching your eyes, nose, and mouth with unwashed hands.  Use disposable paper towels to dry your hands. If not available, use dedicated cloth towels and replace them when they become wet.  Wear Kirat Mezquita facemask and gloves  Wear Marcayla Budge disposable facemask at all times in the room and gloves when you touch or have contact with the patients blood, body fluids, and/or secretions or excretions, such as sweat, saliva, sputum, nasal mucus, vomit, urine, or feces.  Ensure the mask fits over your nose and mouth tightly, and do not touch it during use.  Throw out disposable facemasks and gloves after using them. Do not reuse.  Wash your hands immediately after removing your facemask and gloves.  If your personal clothing becomes contaminated, carefully remove clothing and launder. Wash your hands after handling contaminated clothing.  Place all used disposable facemasks, gloves, and other waste in Victoriya Pol lined  container before disposing them with other household waste.  Remove gloves and wash your hands immediately after handling these items.  Do not share dishes, glasses, or other household items with the patient  Avoid sharing household items. You should not share dishes, drinking glasses, cups, eating utensils, towels, bedding, or other items with Michael [name] patient who is confirmed to have, or being evaluated for, COVID-19 infection.  After the person uses these items, you should wash them thoroughly with soap and water.  Wash laundry thoroughly  Immediately remove and wash clothes or bedding that have blood, body fluids, and/or secretions or excretions, such as sweat, saliva, sputum, nasal mucus, vomit, urine, or feces, on them.  Wear gloves when handling laundry from the patient.  Read and follow directions on labels of laundry or clothing items and detergent. In general, wash and dry with the warmest temperatures recommended on the label.  Clean all areas the individual has used often  Clean all touchable surfaces, such as counters, tabletops, doorknobs, bathroom fixtures, toilets, phones, keyboards, tablets, and bedside tables, every day. Also, clean any surfaces that may have blood, body fluids, and/or secretions or excretions on them.  Wear gloves when cleaning surfaces the patient has come in contact with.  Use Rajanee Schuelke diluted bleach solution (e.g., dilute bleach with 1 part bleach  and 10 parts water) or Jaren Kearn household disinfectant with Aylan Bayona label that says EPA-registered for coronaviruses. To make Seba Madole bleach solution at home, add 1 tablespoon of bleach to 1 quart (4 cups) of water. For Lundy Cozart larger supply, add  cup of bleach to 1 gallon (16 cups) of water.  Read labels of cleaning products and follow recommendations provided on product labels. Labels contain instructions for safe and effective use of the cleaning product including precautions you should take when applying the product, such as wearing gloves or  eye protection and making sure you have good ventilation during use of the product.  Remove gloves and wash hands immediately after cleaning.  Monitor yourself for signs and symptoms of illness Caregivers and household members are considered close contacts, should monitor their health, and will be asked to limit movement outside of the home to the extent possible. Follow the monitoring steps for close contacts listed on the symptom monitoring form.   ? If you have additional questions, contact your local health department or call the epidemiologist on call at [phone] (available 24/7). ? This guidance is subject to change. For the most up-to-date guidance from [facility], please refer to their website: [email or web address]

## 2019-01-10 NOTE — Progress Notes (Signed)
Patient discharged to home with family, discharge instructions reviewed with patient who verbalized understanding. New RX was sent electronically to pharmacy.

## 2019-01-14 ENCOUNTER — Telehealth: Payer: Self-pay | Admitting: *Deleted

## 2019-01-14 ENCOUNTER — Other Ambulatory Visit: Payer: Self-pay | Admitting: Hematology

## 2019-01-14 NOTE — Telephone Encounter (Signed)
Medical records faxed to [place]; [place] [phone]

## 2019-01-14 NOTE — Telephone Encounter (Signed)
Patient called to ask if test results from COVID 19 back yet. He has appt for labs on 3/31 and an infusion on 4/1. Should he keep those?  Per Dr. Irene Limbo - inform patient: COVID19 test done 3/26, anticipate 10 days before results. Lab and infusion scheduled for this week to be cancelled to allow patient time to recover from lung infection. Per Dr. Tommy Rainwater resume treatment on 4/15, Cycle 4, Day1 following evaluation on 4/14.. Patient has lab and  MD appt on 4/14.  Contacted patient - gave wife information as patient could not come to phone. She verba;ozed understanding.

## 2019-01-15 ENCOUNTER — Other Ambulatory Visit: Payer: Medicare Other

## 2019-01-15 LAB — CULTURE, BLOOD (ROUTINE X 2)
Culture: NO GROWTH
Culture: NO GROWTH
Special Requests: ADEQUATE

## 2019-01-16 ENCOUNTER — Ambulatory Visit: Payer: Medicare Other

## 2019-01-17 ENCOUNTER — Encounter: Payer: Self-pay | Admitting: Hematology

## 2019-01-17 LAB — NOVEL CORONAVIRUS, NAA (HOSP ORDER, SEND-OUT TO REF LAB; TAT 18-24 HRS)

## 2019-01-17 LAB — NOVEL CORONAVIRUS, NAA (HOSPITAL ORDER, SEND-OUT TO REF LAB): SARS-COV-2, NAA: NOT DETECTED

## 2019-01-18 ENCOUNTER — Other Ambulatory Visit: Payer: Self-pay | Admitting: Hematology

## 2019-01-18 ENCOUNTER — Telehealth: Payer: Self-pay | Admitting: *Deleted

## 2019-01-18 NOTE — Telephone Encounter (Signed)
Left voice mail for patient in response to MyChart question regarding test results for COVID-19. Test is negative. Per Dr. Irene Limbo, no changes in treatment. Encouraged to contact office for further questions or concerns.

## 2019-01-21 ENCOUNTER — Ambulatory Visit: Payer: Medicare Other | Admitting: Rehabilitation

## 2019-01-24 ENCOUNTER — Encounter: Payer: Medicare Other | Admitting: Physical Therapy

## 2019-01-28 NOTE — Progress Notes (Signed)
HEMATOLOGY/ONCOLOGY CLINIC NOTE  Date of Service: 01/29/19     Patient Care Team: [name], MD as PCP - General (Family Medicine) [name], MD as Consulting Physician (Hematology) [name], MD as Attending Physician (Radiation Oncology) [name], RN as Oncology Nurse Navigator (Oncology) [name] MD ([facility] _ primary oncology) phone number [phone] Email- [email or web address]  CHIEF COMPLAINTS  followup of multiple myeloma.  HISTORY OF PRESENTING ILLNESS:   Michael [name] is a wonderful 68 y.o. 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 Patient is currently on a study protocol and is being monitored for myeloma recurrence.  Based on available records being put together  -Patient was diagnosed with IgG kappa ISS stage I multiple myeloma with hyperdiploid complex cytogenetics in 2012. Bone marrow biopsy apparently showed 50% kappa restricted plasma cells with an initial M spike of 3.4 g/dL with evidence of lytic lesions on his PET/CT scan including right rib fracture and lesions of the lumbar and cervical spine. -Patient was treated under protocol 11-C-0221 with from 02/21/2013 with  Carfilzomib/Revlimid/Dexamethasone for a total of 8 cycles. He reports that his stem cells were collected and stored after 4 cycles -Posttreatment bone marrow biopsy showed less than 5% plasma cells with a negative flow cytometry and improvement in his previously PET avid lesions. Some concern for new focus of activity at C2 lytic lesion at L4 and several small lesions throughout the vertebrae. -Patient was on maintenance lenalidomide 10 mg by  mouth daily for 2 years or more until end of 2014. 08/19/2013 - bone marrow biopsy showed normocellular marrow with less than 5% plasma cells. PET CT scan showed decreased focal metabolic ligament prior rib lesions and no new lesions. Flow cytometry was negative for residual disease. Serum and urine electrophoresis and IFE showed no evidence of monoclonal protein. -Patient notes that he was off protocol for a few years due to lack of clinical trial research funding. -Patient notes that he is back on a follow-up protocol and continues to follow and receive his primary treatment at [facility] at this time. -Patient reports he had his last PET CT scan blood and urine tests and bone marrow examination in February 2018. The results of which are not available to [place] currently.  He notes that there was concern for a very slowly growing sternal FDG avid lesion that has been present for years. He has a follow-up at [place] next month to determine the management of this lesion. He reports that was some consideration of considering radiation. he notes that this lesion has not been biopsied. No other focal bone pains at this time.  We discussed in detail about what our role will be and noted that we shall be available for any acute issues that need to be taken care of locally but that at this time the primary treatment and evaluation will be driven by his team at [facility] as per their research protocols and input. The patient and his wife are clearly aware of this and are in agreement with this plan.  INTERVAL HISTORY  [name] comes is here for continued management of his Multiple Myeloma prior to C4D1 treatment. The patient's last visit with [place] was  on 01/01/19. The pt reports that he is doing well overall.  In the interim, the pt was admitted for SIRS on 01/09/19, discharged the following day. He was negative for the flu, viral respiratory panel and the novel coronavirus. The pt notes that he no longer has a  cough nor fever. He completed his course of antibiotics for pneumonia.  The pt reports that his back pain has continued to steadily improve. He notes that his regular bodily movements are not causing him pain any more. He develops pain after sitting or lying down for a while. His continues on Fentanyl and has not regularly taken the short acting Oxycodone. He notes that he has tried to be more active in his yard, which causes his back pain to flare. The pt has been moving his bowels well with the use of laxatives BID. The pt notes that his appetite has been a little lower recently as well. He notes that he is now able to take a deeper breath without sharp rib pain like he previously had.   Lab results today (01/29/19) of CBC w/diff and CMP is as follows: all values are WNL except for WBC at 2.5k, RBC at 3.26, HGB at 10.8, HCT at 33.5, MCV at 102.8, Lymphs abs at 400, Glucose at 101, Calcium at 8.5. 01/29/19 MMP shows M spike is donw to 0.7g/dl  On review of systems, pt reports moving his bowels, improved back pain, low appetite, stable energy levels, and denies fevers, cough, concer for present infection, dental issues, mouth sores, leg swelling, and any other symptoms.  MEDICAL HISTORY:  Past Medical History:  Diagnosis Date   Angioedema    CPAP (continuous positive airway pressure) dependence    uses at night   Gastroesophageal reflux disease without esophagitis    History of radiation therapy 06/04/18- 06/15/18   25 Gy in 10 fractions to the laryngeal/ cartilage lesion   Hyperlipidemia    Multiple myeloma ([place]) 2012   Treated at [place]   Non morbid obesity due to excess calories    Prediabetes    Prediabetes    Unspecified glaucoma(365.9)    Urticaria, unspecified   Diabetes mellitus Glaucoma Left toes neuropathy  GERD  SURGICAL HISTORY: Past Surgical History:  Procedure Laterality Date   IR RADIOLOGIST EVAL & MGMT  11/13/2018   PORTACATH PLACEMENT     PORTACATH PLACEMENT      removed 02/2016    SOCIAL HISTORY: Social History   Socioeconomic History   Marital status: Married    Spouse name: Not on file   Number of children: 2   Years of education: Not on file   Highest education level: Not on file  Occupational History   Occupation: Retired Chief Strategy Officer for [employer] resource strain: Not on file   Food insecurity:    Worry: Not on file    Inability: Not on file   Transportation needs:    Medical: Not on file    Non-medical: Not on file  Tobacco Use   Smoking status: Never Smoker   Smokeless tobacco: Never Used  Substance and Sexual Activity   Alcohol use: Yes    Alcohol/week: 0.0 standard drinks    Comment: Occasional beer   Drug use: No   Sexual activity: Not on file  Lifestyle   Physical activity:    Days per week: Not on file    Minutes per session: Not on file   Stress: Not on file  Relationships  Social connections:    Talks on phone: Not on file    Gets together: Not on file    Attends religious service: Not on file    Active member of club or organization: Not on file    Attends meetings of clubs or organizations: Not on file    Relationship status: Not on file   Intimate partner violence:    Fear of current or ex partner: No    Emotionally abused: No    Physically abused: No    Forced sexual activity: No  Other Topics Concern   Not on file  Social History Narrative   Unable to asesss intmate partner violence- partner in room   Never smoker Married Retired from the [employer] in June 2017 and moved to [place].  FAMILY HISTORY: Family History  Problem Relation Age of Onset   Cancer Father     ALLERGIES:  has No Known Allergies.  MEDICATIONS:  Current Outpatient Medications  Medication Sig Dispense Refill   acyclovir (ZOVIRAX) 400 MG tablet Take 1 tablet (400 mg total) by mouth 2 (two) times daily. 60 tablet 11   aspirin 81 MG tablet Take 81 mg  by mouth daily.     atorvastatin (LIPITOR) 20 MG tablet Take 20 mg by mouth every evening.      dexamethasone (DECADRON) 4 MG tablet 100m (5 tabs) with breakfast the day after each daratumumab treatment 20 tablet 4   fentaNYL (DURAGESIC) 25 MCG/HR Place 1 patch onto the skin every 3 (three) days. 10 patch 0   Lido-Capsaicin-Men-Methyl Sal (MEDI-PATCH-LIDOCAINE) 0.5-0.035-5-20 % PTCH Apply 1 patch topically 2 (two) times daily as needed. 30 patch 0   Multiple Vitamin (MULTIVITAMIN) tablet Take 1 tablet by mouth daily.     NARCAN 4 MG/0.1ML LIQD nasal spray kit Place 1 spray into the nose once.      nystatin (MYCOSTATIN/NYSTOP) powder Apply 100,000 g topically daily as needed (rash).      ondansetron (ZOFRAN) 8 MG tablet Take 1 tablet (8 mg total) by mouth 2 (two) times daily as needed (Nausea or vomiting). 30 tablet 1   oxyCODONE (OXY IR/ROXICODONE) 5 MG immediate release tablet Take 1-2 tablets (5-10 mg total) by mouth every 4 (four) hours as needed for severe pain or breakthrough pain. 60 tablet 0   pantoprazole (PROTONIX) 40 MG tablet Take 40 mg by mouth daily.      polyethylene glycol (MIRALAX) packet Take 17 g by mouth daily.     prochlorperazine (COMPAZINE) 10 MG tablet Take 1 tablet (10 mg total) by mouth every 6 (six) hours as needed (Nausea or vomiting). 30 tablet 1   senna-docusate (SENOKOT-S) 8.6-50 MG tablet Take 2 tablets by mouth at bedtime. Takes 50 mg 60 tablet 3   No current facility-administered medications for this visit.     REVIEW OF SYSTEMS:    A 10+ POINT REVIEW OF SYSTEMS WAS OBTAINED including neurology, dermatology, psychiatry, cardiac, respiratory, lymph, extremities, GI, GU, Musculoskeletal, constitutional, breasts, reproductive, HEENT.  All pertinent positives are noted in the HPI.  All others are negative.   PHYSICAL EXAMINATION: ECOG PERFORMANCE STATUS: 1 - Symptomatic but completely ambulatory  Vitals:   01/29/19 0913  BP: 126/76  Pulse: 66    Resp: 18  Temp: 99.2 F (37.3 C)  TempSrc: Oral  SpO2: 99%  Weight: 219 lb 8 oz (99.6 kg)  Height: 6' 1"  (1.854 m)   Body mass index is 28.96 kg/m.   GENERAL:alert, in no acute  distress and comfortable SKIN: no acute rashes, no significant lesions EYES: conjunctiva are pink and non-injected, sclera anicteric OROPHARYNX: MMM, no exudates, no oropharyngeal erythema or ulceration NECK: supple, no JVD LYMPH:  no palpable lymphadenopathy in the cervical, axillary or inguinal regions LUNGS: clear to auscultation b/l with normal respiratory effort HEART: regular rate & rhythm ABDOMEN:  normoactive bowel sounds , non tender, not distended. No palpable hepatosplenomegaly.  Extremity: no pedal edema PSYCH: alert & oriented x 3 with fluent speech NEURO: no focal motor/sensory deficits   LABORATORY DATA:  I have reviewed the data as listed  . CBC Latest Ref Rng & Units 01/29/2019 01/10/2019 01/09/2019  WBC 4.0 - 10.5 K/uL 2.5(L) 11.9(H) 13.1(H)  Hemoglobin 13.0 - 17.0 g/dL 10.8(L) 8.4(L) 10.1(L)  Hematocrit 39.0 - 52.0 % 33.5(L) 26.0(L) 30.6(L)  Platelets 150 - 400 K/uL 163 132(L) 160  ANC 1200  . CMP Latest Ref Rng & Units 01/29/2019 01/10/2019 01/09/2019  Glucose 70 - 99 mg/dL 101(H) 108(H) 129(H)  BUN 8 - 23 mg/dL 10 15 11   Creatinine 0.61 - 1.24 mg/dL 0.72 0.90 0.78  Sodium 135 - 145 mmol/L 142 138 142  Potassium 3.5 - 5.1 mmol/L 4.2 4.1 3.7  Chloride 98 - 111 mmol/L 108 109 108  CO2 22 - 32 mmol/L 25 25 25   Calcium 8.9 - 10.3 mg/dL 8.5(L) 8.1(L) 8.8(L)  Total Protein 6.5 - 8.1 g/dL 6.6 5.4(L) 6.5  Total Bilirubin 0.3 - 1.2 mg/dL 1.1 1.2 1.5(H)  Alkaline Phos 38 - 126 U/L 102 72 93  AST 15 - 41 U/L 16 12(L) 15  ALT 0 - 44 U/L 10 12 15      05/08/18 Left Neck Thyroid Cartilage Biopsy:        RADIOGRAPHIC STUDIES: I have personally reviewed the radiological images as listed and agreed with the findings in the report. Dg Chest Port 1 View  Result Date:  01/09/2019 CLINICAL DATA:  68 year old male with vomiting, fever chills. Multiple myeloma patient on chemotherapy. EXAM: PORTABLE CHEST 1 VIEW COMPARISON:  Skeletal survey 10/02/2018. FINDINGS: Portable AP semi upright view at 2104 hours. Patchy bibasilar pulmonary opacity is new since 2019. Lung volumes and mediastinal contours are stable and within normal limits. No pneumothorax or pulmonary edema. No pleural effusion identified. Heterogeneous bone mineralization in some areas. Chronic appearing right 6th rib fracture(s). IMPRESSION: Patchy bibasilar pulmonary opacity is nonspecific suspicious for bilateral aspiration or pneumonia in this clinical setting. Confirmation on PA and lateral views may be valuable when possible. Electronically Signed   By: Genevie Ann M.D.   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 Bone marrow biopsy apparently showed 50% kappa restricted plasma cells with an initial M spike of 3.4 g/dL with evidence of lytic lesions on his PET/CT scan including right rib fracture and lesions of the lumbar and cervical spine. -Patient was treated under protocol 11-C-0221 with from 02/21/2013 with  Carfilzomib/Revlimid/Dexamethasone for a total of 8 cycles. He reports that his stem cells were collected and stored after 4 cycles -Posttreatment bone marrow biopsy showed less than 5% plasma cells with a negative flow cytometry and improvement in his previously PET avid lesions. Some concern for new focus of activity at C2 lytic lesion at L4 and several small lesions throughout the vertebrae. -Patient was on maintenance lenalidomide 10  mg by mouth daily for 2 years or more until end of 2014. 08/19/2013 - bone marrow biopsy showed normocellular marrow with less than 5% plasma cells. PET CT scan showed decreased focal  metabolic ligament prior rib lesions and no new lesions. Flow cytometry was negative for residual disease. Serum and urine electrophoresis and IFE showed no evidence of monoclonal protein. -Patient notes that he was off protocol for a few years due to lack of clinical trial research funding. -Patient returned to follow-up protocol and began to follow and receive his primary treatment at [facility]  -Patient's labs show minimal IgG kappa protein on IFE and a CT chest in May 2018 that did not show any overt signs of myeloma progression in the bones.  Rpt Myeloma labs 03/2017 - M spike of 0.3g/dl with a repeat M spike stable at 0.3 g/dL on 05/23/2017 which remains stable on labs today 07/25/17 Serum kappa lambda free light chain ratio not significantly changed. PET/CT scan showed a single metabolically active sternal lesion without any overt bony destruction. Bone marrow biopsy done on 06/06/2017 - shows no overt involvement with multiple myeloma.  04/24/18 CT Soft Tissue Neck revealed Left laryngeal 3 cm soft tissue mass appears to have originated within and is expanding the left thyroid cartilage. This is new since the August 2018 PET-CT, and there is absent lymphadenopathy. 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 Primary cartilaginous neoplasm, squamous cell carcinoma, and other differential considerations are less likely. Superimposed widespread multiple myeloma lesions in the visible skeleton.  05/08/18 Left neck thyroid cartilage biopsy revealed Plasma cell neoplasm.  05/22/18 PET/CT revealed Soft tissue mass centered around the destroyed left thyroid cartilage is hypermetabolic and consistent with known plasmacytoma. No adenopathy in the neck. 2. Hypermetabolic 3 cm cutaneous and subcutaneous soft tissue mass involving the posterior upper thorax suspicious for cutaneous manifestation of myeloma (plasmacytoma). 3. Persistent and slightly progressive hypermetabolic sternal  lesion. 4. Diffuse lytic myelomatous lesions throughout the bony structures but no other hypermetabolic foci. 5. Focus of hypermetabolism in the left aspect of the lower prostate gland, not present on the prior study. This could be an area of infection/inflammation or potential prostate cancer. Recommend correlation with physical examination and PSA level.    Completed RT between 06/04/18 and 06/15/18 of 25 Gy by 10 fractions  The pt pursued a clinical trial at the [facility] with RT and immunotherapy in October 2019 through October 18, 2018 with further information available at [email or web address]  10/18/18 M Protein increased to 3.5g, from 0.7g at our last visit on 07/10/18. 10/02/18 PET/CT from [facility] indicated new hypermetabolism in left clavicle, left scapula, bilateral ribs, spine, and left proximal femur 10/18/18 MRI [facility] ndicated a pathologic fracture at T12   11/23/18 NM Bone revealed Patchy uptake throughout the ribs and spine with areas of focally increased activity in both T12 pedicles and the right L1 pedicle. This focal activity could be stress related without clear corresponding fracture on available prior studies.  2) h/o Elevated bilirubin level . ? revlmid vs Gilberts  3) PET/CT abnormality in the Prostate. PSA levels WNL -Primary care physician to consider urology referral  4) Abnormal focus of FDG avid at a spot in the liver - will need to be monitor on f/u scans. No pain or other symptoms at this time.  PLAN:  -Discussed pt labwork today, 01/29/19; HGB improved to 10.8, some leukopenia without neutropenia. Chemistries stable. -01/29/19 MMP shows M spike of 0.7g/dl. Last available MMP  from 01/09/19 revealed M Protein improved to 0.9g. -The pt has no prohibitive toxicities from continuing C4D1 Daratumumab, Dexamethasone, and 89m/m2 Carfilzomib at this time. -Daratumumab on D1 and D15 every 28 days -Plan to achieve maximum response in 4-6 cycles, followed by  maintenance monthly Daratumumab -Advised continued crowd avoidance, infection prevention strategies, and frequent hand washing -Advised that pt continue to follow with PT, but not over-exert on his own at home. Recommend back brace otc, or follow up with Dr. MPercell Miller-Continue 269m Fentanyl patch and Oxycodone prn for break through pain -Continue with outpatient PT -Baseline laxative use of 2 pills Senna S at night, and one Miralax. Back off if diarrhea develops. -100-150cc Magnesium citrate if no bowel movement is achieved after 2-3 days -Continue Zometa every 4 weeks -Consume at least 60oz of water each day -Continue Acyclovir -Discussed Port placement- pt would like to wait on this -Continue salt and baking soda mouthwashes -Will see the pt back in 4 weeks   F/u as schedule for current C4 and next cycle 5 of treatment Continue Zometa q4weeks Plz add MD visit for C4D15.   All of the patients questions were answered with apparent satisfaction. The patient knows to call the clinic with any problems, questions or concerns.  The total time spent in the appt was 30 minutes and more than 50% was on counseling and direct patient cares.    GaSullivan LoneD MSEvartAHIVMS SCThe Children'S CenterTSanta Ynez Valley Cottage Hospitalematology/Oncology Physician CoDigestive Health Complexinc(Office):       [phone] cell):  [phone]):           [phone], ScBaldwin Jamaicaam acting as a scribe for Dr. GaSullivan Lone  .I have reviewed the above documentation for accuracy and completeness, and I agree with the above. .GBrunetta GeneraD

## 2019-01-29 ENCOUNTER — Telehealth: Payer: Self-pay | Admitting: Hematology

## 2019-01-29 ENCOUNTER — Inpatient Hospital Stay: Payer: Medicare Other | Attending: Hematology

## 2019-01-29 ENCOUNTER — Inpatient Hospital Stay (HOSPITAL_BASED_OUTPATIENT_CLINIC_OR_DEPARTMENT_OTHER): Payer: Medicare Other | Admitting: Hematology

## 2019-01-29 ENCOUNTER — Other Ambulatory Visit: Payer: Self-pay

## 2019-01-29 VITALS — BP 126/76 | HR 66 | Temp 99.2°F | Resp 18 | Ht 73.0 in | Wt 219.5 lb

## 2019-01-29 DIAGNOSIS — C9002 Multiple myeloma in relapse: Secondary | ICD-10-CM | POA: Insufficient documentation

## 2019-01-29 DIAGNOSIS — C9000 Multiple myeloma not having achieved remission: Secondary | ICD-10-CM

## 2019-01-29 DIAGNOSIS — Z5112 Encounter for antineoplastic immunotherapy: Secondary | ICD-10-CM | POA: Insufficient documentation

## 2019-01-29 DIAGNOSIS — G893 Neoplasm related pain (acute) (chronic): Secondary | ICD-10-CM | POA: Diagnosis not present

## 2019-01-29 DIAGNOSIS — Z79899 Other long term (current) drug therapy: Secondary | ICD-10-CM | POA: Insufficient documentation

## 2019-01-29 DIAGNOSIS — D72819 Decreased white blood cell count, unspecified: Secondary | ICD-10-CM | POA: Diagnosis not present

## 2019-01-29 LAB — CMP (CANCER CENTER ONLY)
ALT: 10 U/L (ref 0–44)
AST: 16 U/L (ref 15–41)
Albumin: 3.7 g/dL (ref 3.5–5.0)
Alkaline Phosphatase: 102 U/L (ref 38–126)
Anion gap: 9 (ref 5–15)
BUN: 10 mg/dL (ref 8–23)
CO2: 25 mmol/L (ref 22–32)
Calcium: 8.5 mg/dL — ABNORMAL LOW (ref 8.9–10.3)
Chloride: 108 mmol/L (ref 98–111)
Creatinine: 0.72 mg/dL (ref 0.61–1.24)
GFR, Est AFR Am: >60 mL/min (ref >60)
GFR, Estimated: >60 mL/min (ref >60)
Glucose, Bld: 101 mg/dL — ABNORMAL HIGH (ref 70–99)
Potassium: 4.2 mmol/L (ref 3.5–5.1)
Sodium: 142 mmol/L (ref 135–145)
Total Bilirubin: 1.1 mg/dL (ref 0.3–1.2)
Total Protein: 6.6 g/dL (ref 6.5–8.1)

## 2019-01-29 LAB — CBC WITH DIFFERENTIAL/PLATELET
Abs Immature Granulocytes: 0 10*3/uL (ref 0.00–0.07)
Basophils Absolute: 0 10*3/uL (ref 0.0–0.1)
Basophils Relative: 1 %
Eosinophils Absolute: 0.1 10*3/uL (ref 0.0–0.5)
Eosinophils Relative: 2 %
HCT: 33.5 % — ABNORMAL LOW (ref 39.0–52.0)
Hemoglobin: 10.8 g/dL — ABNORMAL LOW (ref 13.0–17.0)
Immature Granulocytes: 0 %
Lymphocytes Relative: 17 %
Lymphs Abs: 0.4 10*3/uL — ABNORMAL LOW (ref 0.7–4.0)
MCH: 33.1 pg (ref 26.0–34.0)
MCHC: 32.2 g/dL (ref 30.0–36.0)
MCV: 102.8 fL — ABNORMAL HIGH (ref 80.0–100.0)
Monocytes Absolute: 0.3 10*3/uL (ref 0.1–1.0)
Monocytes Relative: 13 %
Neutro Abs: 1.7 10*3/uL (ref 1.7–7.7)
Neutrophils Relative %: 67 %
Platelets: 163 10*3/uL (ref 150–400)
RBC: 3.26 MIL/uL — ABNORMAL LOW (ref 4.22–5.81)
RDW: 14.5 % (ref 11.5–15.5)
WBC: 2.5 10*3/uL — ABNORMAL LOW (ref 4.0–10.5)
nRBC: 0 % (ref 0.0–0.2)

## 2019-01-29 MED ORDER — SENNOSIDES-DOCUSATE SODIUM 8.6-50 MG PO TABS: 2.0000 | ORAL_TABLET | Freq: Every day | ORAL | 3 refills | Status: DC

## 2019-01-29 MED ORDER — FENTANYL 25 MCG/HR TD PT72: 1.0000 | MEDICATED_PATCH | TRANSDERMAL | 0 refills | Status: DC

## 2019-01-29 NOTE — Telephone Encounter (Signed)
Scheduled appt per 4/14 los. °

## 2019-01-30 ENCOUNTER — Inpatient Hospital Stay: Payer: Medicare Other

## 2019-01-30 ENCOUNTER — Other Ambulatory Visit: Payer: Self-pay

## 2019-01-30 VITALS — BP 116/63 | HR 71 | Temp 98.8°F | Resp 16

## 2019-01-30 DIAGNOSIS — Z7189 Other specified counseling: Secondary | ICD-10-CM

## 2019-01-30 DIAGNOSIS — C9000 Multiple myeloma not having achieved remission: Secondary | ICD-10-CM

## 2019-01-30 DIAGNOSIS — Z79899 Other long term (current) drug therapy: Secondary | ICD-10-CM | POA: Diagnosis not present

## 2019-01-30 DIAGNOSIS — C9002 Multiple myeloma in relapse: Secondary | ICD-10-CM

## 2019-01-30 DIAGNOSIS — G893 Neoplasm related pain (acute) (chronic): Secondary | ICD-10-CM | POA: Diagnosis not present

## 2019-01-30 DIAGNOSIS — Z5112 Encounter for antineoplastic immunotherapy: Secondary | ICD-10-CM | POA: Diagnosis not present

## 2019-01-30 DIAGNOSIS — D72819 Decreased white blood cell count, unspecified: Secondary | ICD-10-CM | POA: Diagnosis not present

## 2019-01-30 LAB — MULTIPLE MYELOMA PANEL, SERUM
Albumin SerPl Elph-Mcnc: 3.5 g/dL (ref 2.9–4.4)
Albumin/Glob SerPl: 1.4 (ref 0.7–1.7)
Alpha 1: 0.2 g/dL (ref 0.0–0.4)
Alpha2 Glob SerPl Elph-Mcnc: 0.6 g/dL (ref 0.4–1.0)
B-Globulin SerPl Elph-Mcnc: 0.8 g/dL (ref 0.7–1.3)
Gamma Glob SerPl Elph-Mcnc: 0.9 g/dL (ref 0.4–1.8)
Globulin, Total: 2.6 g/dL (ref 2.2–3.9)
IgA: 8 mg/dL — ABNORMAL LOW (ref 61–437)
IgG (Immunoglobin G), Serum: 1024 mg/dL (ref 603–1613)
IgM (Immunoglobulin M), Srm: 7 mg/dL — ABNORMAL LOW (ref 20–172)
M Protein SerPl Elph-Mcnc: 0.7 g/dL — ABNORMAL HIGH
Total Protein ELP: 6.1 g/dL (ref 6.0–8.5)

## 2019-01-30 MED ORDER — ACETAMINOPHEN 325 MG PO TABS
650.0000 mg | ORAL_TABLET | Freq: Once | ORAL | Status: AC
  Administered 2019-01-30: 08:00:00 650 mg via ORAL

## 2019-01-30 MED ORDER — SODIUM CHLORIDE 0.9 % IV SOLN
20.0000 mg | Freq: Once | INTRAVENOUS | Status: AC
  Administered 2019-01-30: 09:00:00 20 mg via INTRAVENOUS
  Filled 2019-01-30: qty 2

## 2019-01-30 MED ORDER — METHYLPREDNISOLONE SODIUM SUCC 125 MG IJ SOLR
INTRAMUSCULAR | Status: AC
  Filled 2019-01-30: qty 2

## 2019-01-30 MED ORDER — DEXTROSE 5 % IV SOLN
55.0000 mg/m2 | Freq: Once | INTRAVENOUS | Status: AC
  Administered 2019-01-30: 09:00:00 130 mg via INTRAVENOUS
  Filled 2019-01-30: qty 60

## 2019-01-30 MED ORDER — FAMOTIDINE IN NACL 20-0.9 MG/50ML-% IV SOLN: 20.0000 mg | Freq: Once | INTRAVENOUS | Status: DC

## 2019-01-30 MED ORDER — PROCHLORPERAZINE MALEATE 10 MG PO TABS
ORAL_TABLET | ORAL | Status: AC
  Filled 2019-01-30: qty 1

## 2019-01-30 MED ORDER — DIPHENHYDRAMINE HCL 25 MG PO CAPS
50.0000 mg | ORAL_CAPSULE | Freq: Once | ORAL | Status: AC
  Administered 2019-01-30: 08:00:00 50 mg via ORAL

## 2019-01-30 MED ORDER — SODIUM CHLORIDE 0.9 % IV SOLN
Freq: Once | INTRAVENOUS | Status: AC
  Administered 2019-01-30: 08:00:00 via INTRAVENOUS
  Filled 2019-01-30: qty 250

## 2019-01-30 MED ORDER — ACETAMINOPHEN 325 MG PO TABS
ORAL_TABLET | ORAL | Status: AC
  Filled 2019-01-30: qty 2

## 2019-01-30 MED ORDER — MONTELUKAST SODIUM 10 MG PO TABS
ORAL_TABLET | ORAL | Status: AC
  Filled 2019-01-30: qty 1

## 2019-01-30 MED ORDER — ZOLEDRONIC ACID 4 MG/100ML IV SOLN
4.0000 mg | Freq: Once | INTRAVENOUS | Status: AC
  Administered 2019-01-30: 12:00:00 4 mg via INTRAVENOUS
  Filled 2019-01-30: qty 100

## 2019-01-30 MED ORDER — SODIUM CHLORIDE 0.9 % IV SOLN
15.6000 mg/kg | Freq: Once | INTRAVENOUS | Status: AC
  Administered 2019-01-30: 10:00:00 1700 mg via INTRAVENOUS
  Filled 2019-01-30: qty 80

## 2019-01-30 MED ORDER — MONTELUKAST SODIUM 10 MG PO TABS
10.0000 mg | ORAL_TABLET | Freq: Once | ORAL | Status: AC
  Administered 2019-01-30: 08:00:00 10 mg via ORAL

## 2019-01-30 MED ORDER — METHYLPREDNISOLONE SODIUM SUCC 125 MG IJ SOLR
100.0000 mg | Freq: Once | INTRAMUSCULAR | Status: AC
  Administered 2019-01-30: 08:00:00 100 mg via INTRAVENOUS

## 2019-01-30 MED ORDER — DIPHENHYDRAMINE HCL 25 MG PO CAPS
ORAL_CAPSULE | ORAL | Status: AC
  Filled 2019-01-30: qty 2

## 2019-01-30 MED ORDER — PROCHLORPERAZINE MALEATE 10 MG PO TABS
10.0000 mg | ORAL_TABLET | Freq: Once | ORAL | Status: AC
  Administered 2019-01-30: 10 mg via ORAL

## 2019-01-30 NOTE — Progress Notes (Signed)
Dr. Irene Limbo was notified of low calcium. Advised to proceed with Zometa today.

## 2019-01-30 NOTE — Patient Instructions (Addendum)
[facility] Discharge Instructions for Patients Receiving Chemotherapy  Today you received the following chemotherapy agents: Darzalex  To help prevent nausea and vomiting after your treatment, we encourage you to take your nausea medication as directed.    If you develop nausea and vomiting that is not controlled by your nausea medication, call the clinic.   BELOW ARE SYMPTOMS THAT SHOULD BE REPORTED IMMEDIATELY:  *FEVER GREATER THAN 100.5 F  *CHILLS WITH OR WITHOUT FEVER  NAUSEA AND VOMITING THAT IS NOT CONTROLLED WITH YOUR NAUSEA MEDICATION  *UNUSUAL SHORTNESS OF BREATH  *UNUSUAL BRUISING OR BLEEDING  TENDERNESS IN MOUTH AND THROAT WITH OR WITHOUT PRESENCE OF ULCERS  *URINARY PROBLEMS  *BOWEL PROBLEMS  UNUSUAL RASH Items with * indicate a potential emergency and should be followed up as soon as possible.  Feel free to call the clinic should you have any questions or concerns. The clinic phone number is [phone].  Please show the CHEMO ALERT CARD at check-in to the Emergency Department and triage nurse.   

## 2019-02-06 ENCOUNTER — Other Ambulatory Visit: Payer: Self-pay

## 2019-02-06 ENCOUNTER — Inpatient Hospital Stay: Payer: Medicare Other

## 2019-02-06 VITALS — BP 139/73 | HR 75 | Temp 98.9°F | Resp 18

## 2019-02-06 DIAGNOSIS — Z7189 Other specified counseling: Secondary | ICD-10-CM

## 2019-02-06 DIAGNOSIS — D72819 Decreased white blood cell count, unspecified: Secondary | ICD-10-CM | POA: Diagnosis not present

## 2019-02-06 DIAGNOSIS — Z5112 Encounter for antineoplastic immunotherapy: Secondary | ICD-10-CM | POA: Diagnosis not present

## 2019-02-06 DIAGNOSIS — C9002 Multiple myeloma in relapse: Secondary | ICD-10-CM | POA: Diagnosis not present

## 2019-02-06 DIAGNOSIS — C9000 Multiple myeloma not having achieved remission: Secondary | ICD-10-CM

## 2019-02-06 DIAGNOSIS — G893 Neoplasm related pain (acute) (chronic): Secondary | ICD-10-CM | POA: Diagnosis not present

## 2019-02-06 DIAGNOSIS — Z79899 Other long term (current) drug therapy: Secondary | ICD-10-CM | POA: Diagnosis not present

## 2019-02-06 LAB — CBC WITH DIFFERENTIAL/PLATELET
Abs Immature Granulocytes: 0.01 10*3/uL (ref 0.00–0.07)
Basophils Absolute: 0 10*3/uL (ref 0.0–0.1)
Basophils Relative: 0 %
Eosinophils Absolute: 0.1 10*3/uL (ref 0.0–0.5)
Eosinophils Relative: 2 %
HCT: 33.3 % — ABNORMAL LOW (ref 39.0–52.0)
Hemoglobin: 10.9 g/dL — ABNORMAL LOW (ref 13.0–17.0)
Immature Granulocytes: 0 %
Lymphocytes Relative: 14 %
Lymphs Abs: 0.4 10*3/uL — ABNORMAL LOW (ref 0.7–4.0)
MCH: 32.9 pg (ref 26.0–34.0)
MCHC: 32.7 g/dL (ref 30.0–36.0)
MCV: 100.6 fL — ABNORMAL HIGH (ref 80.0–100.0)
Monocytes Absolute: 0.4 10*3/uL (ref 0.1–1.0)
Monocytes Relative: 13 %
Neutro Abs: 2.2 10*3/uL (ref 1.7–7.7)
Neutrophils Relative %: 71 %
Platelets: 123 10*3/uL — ABNORMAL LOW (ref 150–400)
RBC: 3.31 MIL/uL — ABNORMAL LOW (ref 4.22–5.81)
RDW: 14 % (ref 11.5–15.5)
WBC: 3.1 10*3/uL — ABNORMAL LOW (ref 4.0–10.5)
nRBC: 0 % (ref 0.0–0.2)

## 2019-02-06 LAB — CMP (CANCER CENTER ONLY)
ALT: 10 U/L (ref 0–44)
AST: 11 U/L — ABNORMAL LOW (ref 15–41)
Albumin: 3.5 g/dL (ref 3.5–5.0)
Alkaline Phosphatase: 88 U/L (ref 38–126)
Anion gap: 9 (ref 5–15)
BUN: 9 mg/dL (ref 8–23)
CO2: 23 mmol/L (ref 22–32)
Calcium: 8.4 mg/dL — ABNORMAL LOW (ref 8.9–10.3)
Chloride: 110 mmol/L (ref 98–111)
Creatinine: 0.72 mg/dL (ref 0.61–1.24)
GFR, Est AFR Am: >60 mL/min (ref >60)
GFR, Estimated: >60 mL/min (ref >60)
Glucose, Bld: 137 mg/dL — ABNORMAL HIGH (ref 70–99)
Potassium: 3.7 mmol/L (ref 3.5–5.1)
Sodium: 142 mmol/L (ref 135–145)
Total Bilirubin: 1 mg/dL (ref 0.3–1.2)
Total Protein: 6.2 g/dL — ABNORMAL LOW (ref 6.5–8.1)

## 2019-02-06 MED ORDER — PROCHLORPERAZINE MALEATE 10 MG PO TABS
10.0000 mg | ORAL_TABLET | Freq: Once | ORAL | Status: AC
  Administered 2019-02-06: 10:00:00 10 mg via ORAL

## 2019-02-06 MED ORDER — SODIUM CHLORIDE 0.9 % IV SOLN
Freq: Once | INTRAVENOUS | Status: AC
  Administered 2019-02-06: 10:00:00 via INTRAVENOUS
  Filled 2019-02-06: qty 250

## 2019-02-06 MED ORDER — SODIUM CHLORIDE 0.9 % IV SOLN
Freq: Once | INTRAVENOUS | Status: DC
  Filled 2019-02-06: qty 250

## 2019-02-06 MED ORDER — PROCHLORPERAZINE MALEATE 10 MG PO TABS
ORAL_TABLET | ORAL | Status: AC
  Filled 2019-02-06: qty 1

## 2019-02-06 MED ORDER — DEXTROSE 5 % IV SOLN
55.0000 mg/m2 | Freq: Once | INTRAVENOUS | Status: AC
  Administered 2019-02-06: 11:00:00 130 mg via INTRAVENOUS
  Filled 2019-02-06: qty 60

## 2019-02-06 NOTE — Patient Instructions (Signed)
[facility] Discharge Instructions for Patients Receiving Chemotherapy  Today you received the following chemotherapy agents:  Kyprolis  To help prevent nausea and vomiting after your treatment, we encourage you to take your nausea medication as directed.   If you develop nausea and vomiting that is not controlled by your nausea medication, call the clinic.   BELOW ARE SYMPTOMS THAT SHOULD BE REPORTED IMMEDIATELY:  *FEVER GREATER THAN 100.5 F  *CHILLS WITH OR WITHOUT FEVER  NAUSEA AND VOMITING THAT IS NOT CONTROLLED WITH YOUR NAUSEA MEDICATION  *UNUSUAL SHORTNESS OF BREATH  *UNUSUAL BRUISING OR BLEEDING  TENDERNESS IN MOUTH AND THROAT WITH OR WITHOUT PRESENCE OF ULCERS  *URINARY PROBLEMS  *BOWEL PROBLEMS  UNUSUAL RASH Items with * indicate a potential emergency and should be followed up as soon as possible.  Feel free to call the clinic should you have any questions or concerns. The clinic phone number is [phone].  Please show the CHEMO ALERT CARD at check-in to the Emergency Department and triage nurse.   

## 2019-02-11 NOTE — Progress Notes (Signed)
HEMATOLOGY/ONCOLOGY CLINIC NOTE  Date of Service: 02/12/19     Patient Care Team: [name], MD as PCP - General (Family Medicine) [name], MD as Consulting Physician (Hematology) [name], MD as Attending Physician (Radiation Oncology) [name], RN as Oncology Nurse Navigator (Oncology) [name] MD ([facility] _ primary oncology) phone number [phone] Email- [email or web address]  CHIEF COMPLAINTS  followup of multiple myeloma.  HISTORY OF PRESENTING ILLNESS:   Michael [name] is a wonderful 68 y.o. male , retired [employer] who has been referred to [place] by Dr .Sabra Heck, [name], MD for establishing local oncology care "In case needed for treatment/management of complications pertaining to myeloma"  Patient has a history of multiple myeloma and is currently being followed actively managed at [facility] by Dr.[name] MD who is his primary oncologist. Patient is currently on a study protocol and is being monitored for myeloma recurrence.  Based on available records being put together  -Patient was diagnosed with IgG kappa ISS stage I multiple myeloma with hyperdiploid complex cytogenetics in 2012. Bone marrow biopsy apparently showed 50% kappa restricted plasma cells with an initial M spike of 3.4 g/dL with evidence of lytic lesions on his PET/CT scan including right rib fracture and lesions of the lumbar and cervical spine. -Patient was treated under protocol 11-C-0221 with from 02/21/2013 with  Carfilzomib/Revlimid/Dexamethasone for a total of 8 cycles. He reports that his stem cells were collected and stored after 4 cycles -Posttreatment bone marrow biopsy showed less than 5% plasma cells with a negative flow cytometry and improvement in his previously PET avid lesions. Some concern for new focus of activity at C2 lytic lesion at L4 and several small lesions throughout the vertebrae. -Patient was on maintenance lenalidomide 10 mg by  mouth daily for 2 years or more until end of 2014. 08/19/2013 - bone marrow biopsy showed normocellular marrow with less than 5% plasma cells. PET CT scan showed decreased focal metabolic ligament prior rib lesions and no new lesions. Flow cytometry was negative for residual disease. Serum and urine electrophoresis and IFE showed no evidence of monoclonal protein. -Patient notes that he was off protocol for a few years due to lack of clinical trial research funding. -Patient notes that he is back on a follow-up protocol and continues to follow and receive his primary [facility] at this time. -Patient reports he had his last PET CT scan blood and urine tests and bone marrow examination in February 2018. The results of which are not available to [place] currently.  He notes that there was concern for a very slowly growing sternal FDG avid lesion that has been present for years. He has a follow-up at [facility] next month to determine the management of this lesion. He reports that was some consideration of considering radiation. he notes that this lesion has not been biopsied. No other focal bone pains at this time.  We discussed in detail about what our role will be and noted that we shall be available for any acute issues that need to be taken care of locally but that at this time the primary treatment and evaluation will be driven by his team at [facility] as per their research protocols and input. The patient and his wife are clearly aware of this and are in agreement with this plan.  INTERVAL HISTORY  Michael [name] comes is here for continued management of his Multiple Myeloma prior to C4D15 treatment. The patient's last visit with [place] was  on 01/29/19. The pt reports that he is doing well overall.  The pt reports that he vomited after his last infusion two weeks ago. He notes that he did not take an anti-nausea medication when he arrived home, and ate chili after his infusion, and notes a desire to  change both of these choices after future infusions. The pt notes that his lower left back pain continues to hurt, and endorses some bending and pulling when working in his garden. He notes that his rib pain has improved, as has his shoulder pain and endorses improved left shoulder range of motion. He notes that his fentanyl patch is controlling most of his pain and has not had to use much of his break through medications. The pt is not receiving PT at this time due to the Coronavirus pandemic. He denies any problems passing urine, and it trying to stay better hydrated.  Lab results today (02/12/19) of CBC w/diff is as follows: all values are WNL except for WBC at 3.0k, RBC at 3.28, HGB at 10.7, HCT at 32.8, PLT at 138k, Lymphs abs at 500. 02/12/19 CMP is stable.  On review of systems, pt reports one episode of vomiting, stable energy levels, lower left back pain, and denies problems passing urine, leg swelling, abdominal pain, rib pain, shoulder pain, and any other symptoms.   MEDICAL HISTORY:  Past Medical History:  Diagnosis Date  . Angioedema   . CPAP (continuous positive airway pressure) dependence    uses at night  . Gastroesophageal reflux disease without esophagitis   . History of radiation therapy 06/04/18- 06/15/18   25 Gy in 10 fractions to the laryngeal/ cartilage lesion  . Hyperlipidemia   . Multiple myeloma (HCC) 2012   [facility]  . Non morbid obesity due to excess calories   . Prediabetes   . Prediabetes   . Unspecified glaucoma(365.9)   . Urticaria, unspecified   Diabetes mellitus Glaucoma Left toes neuropathy  GERD  SURGICAL HISTORY: Past Surgical History:  Procedure Laterality Date  . IR RADIOLOGIST EVAL & MGMT  11/13/2018  . PORTACATH PLACEMENT    . PORTACATH PLACEMENT     removed 02/2016    SOCIAL HISTORY: Social History   Socioeconomic History  . Marital status: Married    Spouse name: Not on file  . Number of children: 2  . Years of education: Not on  file  . Highest education level: Not on file  Occupational History  . Occupation: Retired contractor for [employer]  Social Needs  . Financial resource strain: Not on file  . Food insecurity:    Worry: Not on file    Inability: Not on file  . Transportation needs:    Medical: Not on file    Non-medical: Not on file  Tobacco Use  . Smoking status: Never Smoker  . Smokeless tobacco: Never Used  Substance and Sexual Activity  . Alcohol use: Yes    Alcohol/week: 0.0 standard drinks    Comment: Occasional beer  . Drug use: No  . Sexual activity: Not on file  Lifestyle  . Physical activity:    Days per week: Not on file    Minutes per session: Not on file  . Stress: Not on file  Relationships  . Social connections:    Talks on phone: Not on file    Gets together: Not on file    Attends religious service: Not on file    Active member of club or organization: Not   on file    Attends meetings of clubs or organizations: Not on file    Relationship status: Not on file  . Intimate partner violence:    Fear of current or ex partner: No    Emotionally abused: No    Physically abused: No    Forced sexual activity: No  Other Topics Concern  . Not on file  Social History Narrative   Unable to [employer] partner violence- partner in room   Never smoker Married Retired from the [employer] in June 2017 and moved to [facility].  FAMILY HISTORY: Family History  Problem Relation Age of Onset  . Cancer Father     ALLERGIES:  has No Known Allergies.  MEDICATIONS:  Current Outpatient Medications  Medication Sig Dispense Refill  . acyclovir (ZOVIRAX) 400 MG tablet Take 1 tablet (400 mg total) by mouth 2 (two) times daily. 60 tablet 11  . aspirin 81 MG tablet Take 81 mg by mouth daily.    [name] atorvastatin (LIPITOR) 20 MG tablet Take 20 mg by mouth every evening.     [name] dexamethasone (DECADRON) 4 MG tablet 4m (5 tabs) with breakfast the day after each daratumumab  treatment 20 tablet 4  . fentaNYL (DURAGESIC) 25 MCG/HR Place 1 patch onto the skin every 3 (three) days. 10 patch 0  . Lido-Capsaicin-Men-Methyl Sal (MEDI-PATCH-LIDOCAINE) 0.5-0.035-5-20 % PTCH Apply 1 patch topically 2 (two) times daily as needed. 30 patch 0  . Multiple Vitamin (MULTIVITAMIN) tablet Take 1 tablet by mouth daily.    .[name] KitchenNARCAN 4 MG/0.1ML LIQD nasal spray kit Place 1 spray into the nose once.     . nystatin (MYCOSTATIN/NYSTOP) powder Apply 100,000 g topically daily as needed (rash).     . ondansetron (ZOFRAN) 8 MG tablet Take 1 tablet (8 mg total) by mouth 2 (two) times daily as needed (Nausea or vomiting). 30 tablet 1  . oxyCODONE (OXY IR/ROXICODONE) 5 MG immediate release tablet Take 1-2 tablets (5-10 mg total) by mouth every 4 (four) hours as needed for severe pain or breakthrough pain. 60 tablet 0  . pantoprazole (PROTONIX) 40 MG tablet Take 40 mg by mouth daily.     . polyethylene glycol (MIRALAX) packet Take 17 g by mouth daily.    . prochlorperazine (COMPAZINE) 10 MG tablet Take 1 tablet (10 mg total) by mouth every 6 (six) hours as needed (Nausea or vomiting). 30 tablet 1  . senna-docusate (SENOKOT-S) 8.6-50 MG tablet Take 2 tablets by mouth at bedtime. Takes 50 mg 60 tablet 3   No current facility-administered medications for this visit.     REVIEW OF SYSTEMS:    A 10+ POINT REVIEW OF SYSTEMS WAS OBTAINED including neurology, dermatology, psychiatry, cardiac, respiratory, lymph, extremities, GI, GU, Musculoskeletal, constitutional, breasts, reproductive, HEENT.  All pertinent positives are noted in the HPI.  All others are negative.  PHYSICAL EXAMINATION: ECOG PERFORMANCE STATUS: 1 - Symptomatic but completely ambulatory  Vitals:   02/12/19 0939  BP: (!) 144/80  Pulse: 65  Resp: 18  Temp: 98.7 F (37.1 C)  TempSrc: Oral  SpO2: 100%  Weight: 216 lb 3.2 oz (98.1 kg)  Height: 6' 1" (1.854 m)   Body mass index is 28.52 kg/m.   GENERAL:alert, in no acute  distress and comfortable SKIN: no acute rashes, no significant lesions EYES: conjunctiva are pink and non-injected, sclera anicteric OROPHARYNX: MMM, no exudates, no oropharyngeal erythema or ulceration NECK: supple, no JVD LYMPH:  no palpable lymphadenopathy in the cervical,  axillary or inguinal regions LUNGS: clear to auscultation b/l with normal respiratory effort HEART: regular rate & rhythm ABDOMEN:  normoactive bowel sounds , non tender, not distended. No palpable hepatosplenomegaly.  Extremity: no pedal edema PSYCH: alert & oriented x 3 with fluent speech NEURO: no focal motor/sensory deficits   LABORATORY DATA:  I have reviewed the data as listed  . CBC Latest Ref Rng & Units 02/12/2019 02/06/2019 01/29/2019  WBC 4.0 - 10.5 K/uL 3.0(L) 3.1(L) 2.5(L)  Hemoglobin 13.0 - 17.0 g/dL 10.7(L) 10.9(L) 10.8(L)  Hematocrit 39.0 - 52.0 % 32.8(L) 33.3(L) 33.5(L)  Platelets 150 - 400 K/uL 138(L) 123(L) 163  ANC 1200  . CMP Latest Ref Rng & Units 02/12/2019 02/06/2019 01/29/2019  Glucose 70 - 99 mg/dL 97 137(H) 101(H)  BUN 8 - 23 mg/dL _0 Creatinine 0.61 - 1.24 mg/dL 0.80 0.72 0.72  Sodium 135 - 145 mmol/L 142 142 142  Potassium 3.5 - 5.1 mmol/L 4.2 3.7 4.2  Chloride 98 - 111 mmol/L 108 110 108  CO2 22 - 32 mmol/L _1 Calcium 8.9 - 10.3 mg/dL 8.5(L) 8.4(L) 8.5(L)  Total Protein 6.5 - 8.1 g/dL 6.4(L) 6.2(L) 6.6  Total Bilirubin 0.3 - 1.2 mg/dL 0.8 1.0 1.1  Alkaline Phos 38 - 126 U/L 74 88 102  AST 15 - 41 U/L 15 11(L) 16  ALT 0 - 44 U/L _2 05/08/18 Left Neck Thyroid Cartilage Biopsy:        RADIOGRAPHIC STUDIES: I have personally reviewed the radiological images as listed and agreed with the findings in the report. No results found.  ASSESSMENT & PLAN:   68 y.o. caucasian male, retired [employer] with   1) Multiple Myeloma Patient was diagnosed with IgG kappa ISS stage I multiple myeloma with hyperdiploid complex cytogenetics in 2012. Bone marrow  biopsy apparently showed 50% kappa restricted plasma cells with an initial M spike of 3.4 g/dL with evidence of lytic lesions on his PET/CT scan including right rib fracture and lesions of the lumbar and cervical spine. -Patient was treated under protocol 11-C-0221 with from 02/21/2013 with  Carfilzomib/Revlimid/Dexamethasone for a total of 8 cycles. He reports that his stem cells were collected and stored after 4 cycles -Posttreatment bone marrow biopsy showed less than 5% plasma cells with a negative flow cytometry and improvement in his previously PET avid lesions. Some concern for new focus of activity at C2 lytic lesion at L4 and several small lesions throughout the vertebrae. -Patient was on maintenance lenalidomide 10 mg by mouth daily for 2 years or more until end of 2014. 08/19/2013 - bone marrow biopsy showed normocellular marrow with less than 5% plasma cells. PET CT scan showed decreased focal metabolic ligament prior rib lesions and no new lesions. Flow cytometry was negative for residual disease. Serum and urine electrophoresis and IFE showed no evidence of monoclonal protein. -Patient notes that he was off protocol for a few years due to lack of clinical trial research funding. -Patient returned to follow-up protocol and began to follow and receive his primary treatment at [facility]  -Patient's labs show minimal IgG kappa protein on IFE and a CT chest in May 2018 that did not show any overt signs of myeloma progression in the bones.  Rpt Myeloma labs 03/2017 - M spike of 0.3g/dl with a repeat M spike stable at 0.3 g/dL on 05/23/2017 which remains stable on labs today 07/25/17 Serum kappa lambda free light chain ratio not  significantly changed. PET/CT scan showed a single metabolically active sternal lesion without any overt bony destruction. Bone marrow biopsy done on 06/06/2017 - shows no overt involvement with multiple myeloma.  04/24/18 CT Soft Tissue Neck revealed Left laryngeal 3 cm soft  tissue mass appears to have originated within and is expanding the left thyroid cartilage. This is new since the August 2018 PET-CT, and there is absent lymphadenopathy. 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 Primary cartilaginous neoplasm, squamous cell carcinoma, and other differential considerations are less likely. Superimposed widespread multiple myeloma lesions in the visible skeleton.  05/08/18 Left neck thyroid cartilage biopsy revealed Plasma cell neoplasm.  05/22/18 PET/CT revealed Soft tissue mass centered around the destroyed left thyroid cartilage is hypermetabolic and consistent with known plasmacytoma. No adenopathy in the neck. 2. Hypermetabolic 3 cm cutaneous and subcutaneous soft tissue mass involving the posterior upper thorax suspicious for cutaneous manifestation of myeloma (plasmacytoma). 3. Persistent and slightly progressive hypermetabolic sternal lesion. 4. Diffuse lytic myelomatous lesions throughout the bony structures but no other hypermetabolic foci. 5. Focus of hypermetabolism in the left aspect of the lower prostate gland, not present on the prior study. This could be an area of infection/inflammation or potential prostate cancer. Recommend correlation with physical examination and PSA level.    Completed RT between 06/04/18 and 06/15/18 of 25 Gy by 10 fractions  The pt pursued a clinical [facility] with [facility] and immunotherapy in October 2019 through October 18, 2018 with further information available at http://clinicaltrials.gov/ct2/show/NCT03910439  10/18/18 M Protein increased to 3.5g, from 0.7g at our last visit on 07/10/18. 10/02/18 PET/[facility] indicated new hypermetabolism in left clavicle, left scapula, bilateral ribs, spine, and left proximal femur 10/18/18 MRI from [facility] ndicated a pathologic fracture at T12   11/23/18 NM Bone revealed Patchy uptake throughout the ribs and spine with areas of focally increased activity in both T12  pedicles and the right L1 pedicle. This focal activity could be stress related without clear corresponding fracture on available prior studies.  2) h/o Elevated bilirubin level . ? revlmid vs Gilberts  3) PET/CT abnormality in the Prostate. PSA levels WNL -Primary care physician to consider urology referral  4) Abnormal focus of FDG avid at a spot in the liver - will need to be monitor on f/u scans. No pain or other symptoms at this time.  PLAN:  -Discussed pt labwork today, 02/12/19; blood counts are stable -Last available M Protein was at 0.7g on 01/29/19 -The pt has no prohibitive toxicities from continuing C4D15 Daratumumab, Dexamethasone and 56mg/m2 Carfilzomib, tomorrow 02/13/19.  -Daratumumab on D1 and D15 every 28 days -Plan to achieve maximum response in 4-6 cycles, followed by maintenance monthly Daratumumab -Advised continued crowd avoidance, infection prevention strategies, and frequent hand washing -Advised that pt continue to follow with PT, but not over-exert on his own at home. Recommend back brace otc, or follow up with Dr. Murphy -Continue 25mcg Fentanyl patch and Oxycodone prn for break through pain -Recommend milk/dairy consumption for calcium and Vitamin D -Baseline laxative use of 2 pills Senna S at night, and one Miralax. Back off if diarrhea develops. -100-150cc Magnesium citrate if no bowel movement is achieved after 2-3 days -Continue Zometa every 4 weeks -Consume at least 60oz of water each day -Continue Acyclovir -Discussed Port placement- pt would like to wait on this -Continue salt and baking soda mouthwashes -Recommended that the pt continue to eat well, drink at least 48-64 oz of   water each day, and walk 20-30 minutes each day. -Will see the pt back in 4 weeks   Plz schedule next 2 cycles of treatment as ordered RTC with Dr Irene Limbo in 4 weeks  Plz cancel MD visit on 02/26/19 Labs D1,8 and 15 every cycle of treatment Continue Zometa q4weeks   All of the  patients questions were answered with apparent satisfaction. The patient knows to call the clinic with any problems, questions or concerns.  The total time spent in the appt was 25 minutes and more than 50% was on counseling and direct patient cares.    Sullivan Lone MD [place] [facility]IVMS [facility] Hematology/Oncology Physician [facility]  (Office):       [phone] (Work cell):  [phone] (Fax):           [phone]  I, Baldwin Jamaica, am acting as a scribe for Dr. Sullivan Lone.   .I have reviewed the above documentation for accuracy and completeness, and I agree with the above. [name] MD

## 2019-02-12 ENCOUNTER — Telehealth: Payer: Self-pay | Admitting: Hematology

## 2019-02-12 ENCOUNTER — Inpatient Hospital Stay (HOSPITAL_BASED_OUTPATIENT_CLINIC_OR_DEPARTMENT_OTHER): Payer: Medicare Other | Admitting: Hematology

## 2019-02-12 ENCOUNTER — Inpatient Hospital Stay: Payer: Medicare Other

## 2019-02-12 ENCOUNTER — Other Ambulatory Visit: Payer: Self-pay

## 2019-02-12 VITALS — BP 144/80 | HR 65 | Temp 98.7°F | Resp 18 | Ht 73.0 in | Wt 216.2 lb

## 2019-02-12 DIAGNOSIS — Z79899 Other long term (current) drug therapy: Secondary | ICD-10-CM

## 2019-02-12 DIAGNOSIS — C9002 Multiple myeloma in relapse: Secondary | ICD-10-CM | POA: Diagnosis not present

## 2019-02-12 DIAGNOSIS — C9000 Multiple myeloma not having achieved remission: Secondary | ICD-10-CM

## 2019-02-12 DIAGNOSIS — Z5112 Encounter for antineoplastic immunotherapy: Secondary | ICD-10-CM | POA: Diagnosis not present

## 2019-02-12 DIAGNOSIS — G893 Neoplasm related pain (acute) (chronic): Secondary | ICD-10-CM | POA: Diagnosis not present

## 2019-02-12 DIAGNOSIS — D72819 Decreased white blood cell count, unspecified: Secondary | ICD-10-CM

## 2019-02-12 LAB — CMP (CANCER CENTER ONLY)
ALT: 12 U/L (ref 0–44)
AST: 15 U/L (ref 15–41)
Albumin: 3.6 g/dL (ref 3.5–5.0)
Alkaline Phosphatase: 74 U/L (ref 38–126)
Anion gap: 7 (ref 5–15)
BUN: 9 mg/dL (ref 8–23)
CO2: 27 mmol/L (ref 22–32)
Calcium: 8.5 mg/dL — ABNORMAL LOW (ref 8.9–10.3)
Chloride: 108 mmol/L (ref 98–111)
Creatinine: 0.8 mg/dL (ref 0.61–1.24)
GFR, Est AFR Am: >60 mL/min (ref >60)
GFR, Estimated: >60 mL/min (ref >60)
Glucose, Bld: 97 mg/dL (ref 70–99)
Potassium: 4.2 mmol/L (ref 3.5–5.1)
Sodium: 142 mmol/L (ref 135–145)
Total Bilirubin: 0.8 mg/dL (ref 0.3–1.2)
Total Protein: 6.4 g/dL — ABNORMAL LOW (ref 6.5–8.1)

## 2019-02-12 LAB — CBC WITH DIFFERENTIAL/PLATELET
Abs Immature Granulocytes: 0.02 10*3/uL (ref 0.00–0.07)
Basophils Absolute: 0 10*3/uL (ref 0.0–0.1)
Basophils Relative: 0 %
Eosinophils Absolute: 0.1 10*3/uL (ref 0.0–0.5)
Eosinophils Relative: 2 %
HCT: 32.8 % — ABNORMAL LOW (ref 39.0–52.0)
Hemoglobin: 10.7 g/dL — ABNORMAL LOW (ref 13.0–17.0)
Immature Granulocytes: 1 %
Lymphocytes Relative: 16 %
Lymphs Abs: 0.5 10*3/uL — ABNORMAL LOW (ref 0.7–4.0)
MCH: 32.6 pg (ref 26.0–34.0)
MCHC: 32.6 g/dL (ref 30.0–36.0)
MCV: 100 fL (ref 80.0–100.0)
Monocytes Absolute: 0.4 10*3/uL (ref 0.1–1.0)
Monocytes Relative: 14 %
Neutro Abs: 2 10*3/uL (ref 1.7–7.7)
Neutrophils Relative %: 67 %
Platelets: 138 10*3/uL — ABNORMAL LOW (ref 150–400)
RBC: 3.28 MIL/uL — ABNORMAL LOW (ref 4.22–5.81)
RDW: 13.5 % (ref 11.5–15.5)
WBC: 3 10*3/uL — ABNORMAL LOW (ref 4.0–10.5)
nRBC: 0 % (ref 0.0–0.2)

## 2019-02-12 NOTE — Telephone Encounter (Signed)
Scheduled appt per 4/28 los. °

## 2019-02-13 ENCOUNTER — Other Ambulatory Visit: Payer: Medicare Other

## 2019-02-13 ENCOUNTER — Other Ambulatory Visit: Payer: Self-pay

## 2019-02-13 ENCOUNTER — Inpatient Hospital Stay: Payer: Medicare Other

## 2019-02-13 VITALS — BP 132/72 | HR 71 | Temp 98.6°F | Resp 16

## 2019-02-13 DIAGNOSIS — Z79899 Other long term (current) drug therapy: Secondary | ICD-10-CM | POA: Diagnosis not present

## 2019-02-13 DIAGNOSIS — C9002 Multiple myeloma in relapse: Secondary | ICD-10-CM | POA: Diagnosis not present

## 2019-02-13 DIAGNOSIS — C9000 Multiple myeloma not having achieved remission: Secondary | ICD-10-CM

## 2019-02-13 DIAGNOSIS — D72819 Decreased white blood cell count, unspecified: Secondary | ICD-10-CM | POA: Diagnosis not present

## 2019-02-13 DIAGNOSIS — Z7189 Other specified counseling: Secondary | ICD-10-CM

## 2019-02-13 DIAGNOSIS — Z5112 Encounter for antineoplastic immunotherapy: Secondary | ICD-10-CM | POA: Diagnosis not present

## 2019-02-13 DIAGNOSIS — G893 Neoplasm related pain (acute) (chronic): Secondary | ICD-10-CM | POA: Diagnosis not present

## 2019-02-13 MED ORDER — DIPHENHYDRAMINE HCL 25 MG PO CAPS
50.0000 mg | ORAL_CAPSULE | Freq: Once | ORAL | Status: AC
  Administered 2019-02-13: 50 mg via ORAL

## 2019-02-13 MED ORDER — METHYLPREDNISOLONE SODIUM SUCC 125 MG IJ SOLR
100.0000 mg | Freq: Once | INTRAMUSCULAR | Status: AC
  Administered 2019-02-13: 10:00:00 100 mg via INTRAVENOUS

## 2019-02-13 MED ORDER — SODIUM CHLORIDE 0.9 % IV SOLN
Freq: Once | INTRAVENOUS | Status: AC
  Administered 2019-02-13: 09:00:00 via INTRAVENOUS
  Filled 2019-02-13: qty 250

## 2019-02-13 MED ORDER — DEXTROSE 5 % IV SOLN
55.0000 mg/m2 | Freq: Once | INTRAVENOUS | Status: AC
  Administered 2019-02-13: 10:00:00 130 mg via INTRAVENOUS
  Filled 2019-02-13: qty 60

## 2019-02-13 MED ORDER — METHYLPREDNISOLONE SODIUM SUCC 125 MG IJ SOLR
INTRAMUSCULAR | Status: AC
  Filled 2019-02-13: qty 2

## 2019-02-13 MED ORDER — SODIUM CHLORIDE 0.9 % IV SOLN
15.6000 mg/kg | Freq: Once | INTRAVENOUS | Status: AC
  Administered 2019-02-13: 11:00:00 1700 mg via INTRAVENOUS
  Filled 2019-02-13: qty 80

## 2019-02-13 MED ORDER — MONTELUKAST SODIUM 10 MG PO TABS
ORAL_TABLET | ORAL | Status: AC
  Filled 2019-02-13: qty 1

## 2019-02-13 MED ORDER — PROCHLORPERAZINE MALEATE 10 MG PO TABS
10.0000 mg | ORAL_TABLET | Freq: Once | ORAL | Status: AC
  Administered 2019-02-13: 10:00:00 10 mg via ORAL

## 2019-02-13 MED ORDER — ACETAMINOPHEN 325 MG PO TABS
650.0000 mg | ORAL_TABLET | Freq: Once | ORAL | Status: AC
  Administered 2019-02-13: 10:00:00 650 mg via ORAL

## 2019-02-13 MED ORDER — FAMOTIDINE IN NACL 20-0.9 MG/50ML-% IV SOLN
20.0000 mg | Freq: Once | INTRAVENOUS | Status: AC
  Administered 2019-02-13: 10:00:00 20 mg via INTRAVENOUS

## 2019-02-13 MED ORDER — MONTELUKAST SODIUM 10 MG PO TABS
10.0000 mg | ORAL_TABLET | Freq: Once | ORAL | Status: AC
  Administered 2019-02-13: 10 mg via ORAL

## 2019-02-13 MED ORDER — PROCHLORPERAZINE MALEATE 10 MG PO TABS
ORAL_TABLET | ORAL | Status: AC
  Filled 2019-02-13: qty 1

## 2019-02-13 MED ORDER — FAMOTIDINE IN NACL 20-0.9 MG/50ML-% IV SOLN
INTRAVENOUS | Status: AC
  Filled 2019-02-13: qty 50

## 2019-02-13 MED ORDER — DIPHENHYDRAMINE HCL 25 MG PO CAPS
ORAL_CAPSULE | ORAL | Status: AC
  Filled 2019-02-13: qty 2

## 2019-02-13 MED ORDER — ACETAMINOPHEN 325 MG PO TABS
ORAL_TABLET | ORAL | Status: AC
  Filled 2019-02-13: qty 2

## 2019-02-13 NOTE — Patient Instructions (Addendum)
[facility] Discharge Instructions for Patients Receiving Chemotherapy  Today you received the following chemotherapy agents:  carfilzomib (Kyprolis), daratumumab (Darzalex)  To help prevent nausea and vomiting after your treatment, we encourage you to take your nausea medication as prescribed.   If you develop nausea and vomiting that is not controlled by your nausea medication, call the clinic.   BELOW ARE SYMPTOMS THAT SHOULD BE REPORTED IMMEDIATELY:  *FEVER GREATER THAN 100.5 F  *CHILLS WITH OR WITHOUT FEVER  NAUSEA AND VOMITING THAT IS NOT CONTROLLED WITH YOUR NAUSEA MEDICATION  *UNUSUAL SHORTNESS OF BREATH  *UNUSUAL BRUISING OR BLEEDING  TENDERNESS IN MOUTH AND THROAT WITH OR WITHOUT PRESENCE OF ULCERS  *URINARY PROBLEMS  *BOWEL PROBLEMS  UNUSUAL RASH Items with * indicate a potential emergency and should be followed up as soon as possible.  Feel free to call the clinic should you have any questions or concerns. The clinic phone number is [phone].  Please show the [facility] at check-in to the Emergency Department and triage nurse.   Coronavirus (COVID-19) Are you at risk?  Are you at risk for the Coronavirus (COVID-19)?  To be considered HIGH RISK for Coronavirus (COVID-19), you have to meet the following criteria:  . Traveled to [place], [place], [place], [place] or [place]; or in the [place] to [place], [place], [place], or [place]; and have fever, cough, and shortness of breath within the last 2 weeks of travel OR . Been in close contact with a person diagnosed with COVID-19 within the last 2 weeks and have fever, cough, and shortness of breath . IF YOU DO NOT MEET THESE CRITERIA, YOU ARE CONSIDERED LOW RISK FOR COVID-19.  What to do if you are HIGH RISK for COVID-19?  [name] If you are having a medical emergency, call 911. . Seek medical care right away. Before you go to a doctor's office, urgent care or emergency  department, call ahead and tell them about your recent travel, contact with someone diagnosed with COVID-19, and your symptoms. You should receive instructions from your physician's office regarding next steps of care.  . When you arrive at healthcare provider, tell the healthcare staff immediately you have returned from visiting [place], [place], [place], [place] or [place]; or traveled in the [place] to [place], [place], [place], or [place]; in the last two weeks or you have been in close contact with a person diagnosed with COVID-19 in the last 2 weeks.   . Tell the health care staff about your symptoms: fever, cough and shortness of breath. . After you have been seen by a medical provider, you will be either: o Tested for (COVID-19) and discharged home on quarantine except to seek medical care if symptoms worsen, and asked to  - Stay home and avoid contact with others until you get your results (4-5 days)  - Avoid travel on public transportation if possible (such as bus, train, or airplane) or o Sent to the Emergency Department by EMS for evaluation, COVID-19 testing, and possible admission depending on your condition and test results.  What to do if you are LOW RISK for COVID-19?  Reduce your risk of any infection by using the same precautions used for avoiding the common cold or flu:  [name] Wash your hands often with soap and warm water for at least 20 seconds.  If soap and water are not readily available, use an alcohol-based hand sanitizer with at least 60% alcohol.  [name]  If coughing or sneezing, cover your mouth and nose by coughing or sneezing into the elbow areas of your shirt or coat, into a tissue or into your sleeve (not your hands). . Avoid shaking hands with others and consider head nods or verbal greetings only. . Avoid touching your eyes, nose, or mouth with unwashed hands.  . Avoid close contact with people who are sick. . Avoid places or events with large numbers of people  in one location, like concerts or sporting events. . Carefully consider travel plans you have or are making. . If you are planning any travel outside or inside the [place], visit the CDC's Travelers' Health webpage for the latest health notices. . If you have some symptoms but not all symptoms, continue to monitor at home and seek medical attention if your symptoms worsen. . If you are having a medical emergency, call 911.   ADDITIONAL HEALTHCARE OPTIONS FOR PATIENTS  [facility] / e-Visit: [email or web address]         [facility]: [phone]  [facility]: [phone]                   [facility]: [phone]   

## 2019-02-21 ENCOUNTER — Telehealth: Payer: Self-pay | Admitting: *Deleted

## 2019-02-21 NOTE — Telephone Encounter (Signed)
Contacted Michael [name], Patient Care Coordinator of Clinical Trials at [facility]. Dr. Irene Limbo has requested patient's records from [facility] (patient signed ROI). Ms. Aldona Lento given fax # [phone]. She states she will fax requeted records.

## 2019-02-26 ENCOUNTER — Ambulatory Visit: Payer: Medicare Other | Admitting: Hematology

## 2019-02-26 ENCOUNTER — Other Ambulatory Visit: Payer: Self-pay | Admitting: Hematology

## 2019-02-26 ENCOUNTER — Other Ambulatory Visit: Payer: Medicare Other

## 2019-02-27 ENCOUNTER — Inpatient Hospital Stay: Payer: Medicare Other

## 2019-02-27 ENCOUNTER — Inpatient Hospital Stay: Payer: Medicare Other | Attending: Hematology

## 2019-02-27 ENCOUNTER — Other Ambulatory Visit: Payer: Self-pay

## 2019-02-27 VITALS — BP 132/64 | HR 70 | Temp 98.4°F | Resp 16 | Ht 73.0 in | Wt 218.5 lb

## 2019-02-27 DIAGNOSIS — G893 Neoplasm related pain (acute) (chronic): Secondary | ICD-10-CM | POA: Diagnosis not present

## 2019-02-27 DIAGNOSIS — Z7189 Other specified counseling: Secondary | ICD-10-CM

## 2019-02-27 DIAGNOSIS — D72819 Decreased white blood cell count, unspecified: Secondary | ICD-10-CM | POA: Diagnosis not present

## 2019-02-27 DIAGNOSIS — Z5112 Encounter for antineoplastic immunotherapy: Secondary | ICD-10-CM | POA: Diagnosis not present

## 2019-02-27 DIAGNOSIS — C9000 Multiple myeloma not having achieved remission: Secondary | ICD-10-CM

## 2019-02-27 DIAGNOSIS — C9002 Multiple myeloma in relapse: Secondary | ICD-10-CM

## 2019-02-27 DIAGNOSIS — Z79899 Other long term (current) drug therapy: Secondary | ICD-10-CM | POA: Diagnosis not present

## 2019-02-27 LAB — CMP (CANCER CENTER ONLY)
ALT: 11 U/L (ref 0–44)
AST: 18 U/L (ref 15–41)
Albumin: 3.6 g/dL (ref 3.5–5.0)
Alkaline Phosphatase: 71 U/L (ref 38–126)
Anion gap: 8 (ref 5–15)
BUN: 7 mg/dL — ABNORMAL LOW (ref 8–23)
CO2: 24 mmol/L (ref 22–32)
Calcium: 8.5 mg/dL — ABNORMAL LOW (ref 8.9–10.3)
Chloride: 108 mmol/L (ref 98–111)
Creatinine: 0.76 mg/dL (ref 0.61–1.24)
GFR, Est AFR Am: >60 mL/min (ref >60)
GFR, Estimated: >60 mL/min (ref >60)
Glucose, Bld: 148 mg/dL — ABNORMAL HIGH (ref 70–99)
Potassium: 4.2 mmol/L (ref 3.5–5.1)
Sodium: 140 mmol/L (ref 135–145)
Total Bilirubin: 0.7 mg/dL (ref 0.3–1.2)
Total Protein: 6.2 g/dL — ABNORMAL LOW (ref 6.5–8.1)

## 2019-02-27 LAB — CBC WITH DIFFERENTIAL/PLATELET
Abs Immature Granulocytes: 0.01 10*3/uL (ref 0.00–0.07)
Basophils Absolute: 0 10*3/uL (ref 0.0–0.1)
Basophils Relative: 0 %
Eosinophils Absolute: 0.1 10*3/uL (ref 0.0–0.5)
Eosinophils Relative: 3 %
HCT: 34 % — ABNORMAL LOW (ref 39.0–52.0)
Hemoglobin: 11 g/dL — ABNORMAL LOW (ref 13.0–17.0)
Immature Granulocytes: 0 %
Lymphocytes Relative: 19 %
Lymphs Abs: 0.5 10*3/uL — ABNORMAL LOW (ref 0.7–4.0)
MCH: 32.5 pg (ref 26.0–34.0)
MCHC: 32.4 g/dL (ref 30.0–36.0)
MCV: 100.6 fL — ABNORMAL HIGH (ref 80.0–100.0)
Monocytes Absolute: 0.4 10*3/uL (ref 0.1–1.0)
Monocytes Relative: 13 %
Neutro Abs: 1.7 10*3/uL (ref 1.7–7.7)
Neutrophils Relative %: 65 %
Platelets: 177 10*3/uL (ref 150–400)
RBC: 3.38 MIL/uL — ABNORMAL LOW (ref 4.22–5.81)
RDW: 13.3 % (ref 11.5–15.5)
WBC: 2.7 10*3/uL — ABNORMAL LOW (ref 4.0–10.5)
nRBC: 0 % (ref 0.0–0.2)

## 2019-02-27 MED ORDER — SODIUM CHLORIDE 0.9 % IV SOLN
Freq: Once | INTRAVENOUS | Status: AC
  Administered 2019-02-27: 09:00:00 via INTRAVENOUS
  Filled 2019-02-27: qty 250

## 2019-02-27 MED ORDER — ACETAMINOPHEN 325 MG PO TABS
650.0000 mg | ORAL_TABLET | Freq: Once | ORAL | Status: AC
  Administered 2019-02-27: 650 mg via ORAL

## 2019-02-27 MED ORDER — ACETAMINOPHEN 325 MG PO TABS
ORAL_TABLET | ORAL | Status: AC
  Filled 2019-02-27: qty 2

## 2019-02-27 MED ORDER — MONTELUKAST SODIUM 10 MG PO TABS
ORAL_TABLET | ORAL | Status: AC
  Filled 2019-02-27: qty 1

## 2019-02-27 MED ORDER — METHYLPREDNISOLONE SODIUM SUCC 125 MG IJ SOLR
100.0000 mg | Freq: Once | INTRAMUSCULAR | Status: AC
  Administered 2019-02-27: 100 mg via INTRAVENOUS

## 2019-02-27 MED ORDER — SODIUM CHLORIDE 0.9 % IV SOLN
Freq: Once | INTRAVENOUS | Status: AC
  Administered 2019-02-27: 10:00:00 via INTRAVENOUS
  Filled 2019-02-27: qty 250

## 2019-02-27 MED ORDER — MONTELUKAST SODIUM 10 MG PO TABS
10.0000 mg | ORAL_TABLET | Freq: Once | ORAL | Status: AC
  Administered 2019-02-27: 10 mg via ORAL

## 2019-02-27 MED ORDER — METHYLPREDNISOLONE SODIUM SUCC 125 MG IJ SOLR
INTRAMUSCULAR | Status: AC
  Filled 2019-02-27: qty 2

## 2019-02-27 MED ORDER — DIPHENHYDRAMINE HCL 25 MG PO CAPS
ORAL_CAPSULE | ORAL | Status: AC
  Filled 2019-02-27: qty 2

## 2019-02-27 MED ORDER — SODIUM CHLORIDE 0.9 % IV SOLN
15.6000 mg/kg | Freq: Once | INTRAVENOUS | Status: AC
  Administered 2019-02-27: 1700 mg via INTRAVENOUS
  Filled 2019-02-27: qty 80

## 2019-02-27 MED ORDER — DIPHENHYDRAMINE HCL 25 MG PO CAPS
50.0000 mg | ORAL_CAPSULE | Freq: Once | ORAL | Status: AC
  Administered 2019-02-27: 09:00:00 50 mg via ORAL

## 2019-02-27 MED ORDER — PROCHLORPERAZINE MALEATE 10 MG PO TABS
ORAL_TABLET | ORAL | Status: AC
  Filled 2019-02-27: qty 1

## 2019-02-27 MED ORDER — ZOLEDRONIC ACID 4 MG/100ML IV SOLN
4.0000 mg | Freq: Once | INTRAVENOUS | Status: AC
  Administered 2019-02-27: 4 mg via INTRAVENOUS
  Filled 2019-02-27: qty 100

## 2019-02-27 MED ORDER — FAMOTIDINE IN NACL 20-0.9 MG/50ML-% IV SOLN
INTRAVENOUS | Status: AC
  Filled 2019-02-27: qty 50

## 2019-02-27 MED ORDER — DEXTROSE 5 % IV SOLN
55.0000 mg/m2 | Freq: Once | INTRAVENOUS | Status: AC
  Administered 2019-02-27: 130 mg via INTRAVENOUS
  Filled 2019-02-27: qty 5

## 2019-02-27 MED ORDER — FAMOTIDINE IN NACL 20-0.9 MG/50ML-% IV SOLN
20.0000 mg | Freq: Once | INTRAVENOUS | Status: AC
  Administered 2019-02-27: 20 mg via INTRAVENOUS

## 2019-02-27 MED ORDER — PROCHLORPERAZINE MALEATE 10 MG PO TABS
10.0000 mg | ORAL_TABLET | Freq: Once | ORAL | Status: AC
  Administered 2019-02-27: 10 mg via ORAL

## 2019-02-27 NOTE — Patient Instructions (Signed)
[facility] Discharge Instructions for Patients Receiving Chemotherapy  Today you received the following chemotherapy agents: Kyprolis, Darzalex, Zometa  To help prevent nausea and vomiting after your treatment, we encourage you to take your nausea medication: As directed by your MD   If you develop nausea and vomiting that is not controlled by your nausea medication, call the clinic.   BELOW ARE SYMPTOMS THAT SHOULD BE REPORTED IMMEDIATELY:  *FEVER GREATER THAN 100.5 F  *CHILLS WITH OR WITHOUT FEVER  NAUSEA AND VOMITING THAT IS NOT CONTROLLED WITH YOUR NAUSEA MEDICATION  *UNUSUAL SHORTNESS OF BREATH  *UNUSUAL BRUISING OR BLEEDING  TENDERNESS IN MOUTH AND THROAT WITH OR WITHOUT PRESENCE OF ULCERS  *URINARY PROBLEMS  *BOWEL PROBLEMS  UNUSUAL RASH Items with * indicate a potential emergency and should be followed up as soon as possible.  Feel free to call the clinic should you have any questions or concerns. The clinic phone number is [phone].  Please show the [place] at check-in to the Emergency Department and triage nurse.  Coronavirus (COVID-19) Are you at risk?  Are you at risk for the Coronavirus (COVID-19)?  To be considered HIGH RISK for Coronavirus (COVID-19), you have to meet the following criteria:  . Traveled to [place], [place], [place], [place] or [place]; or in the [place] to [place], [place], [place], or [place]; and have fever, cough, and shortness of breath within the last 2 weeks of travel OR . Been in close contact with a person diagnosed with COVID-19 within the last 2 weeks and have fever, cough, and shortness of breath . IF YOU DO NOT MEET THESE CRITERIA, YOU ARE CONSIDERED LOW RISK FOR COVID-19.  What to do if you are HIGH RISK for COVID-19?  [name] If you are having a medical emergency, call 911. . Seek medical care right away. Before you go to a doctor's office, urgent care or emergency department, call ahead  and tell them about your recent travel, contact with someone diagnosed with COVID-19, and your symptoms. You should receive instructions from your physician's office regarding next steps of care.  . When you arrive at healthcare provider, tell the healthcare staff immediately you have returned from visiting [place], [place], [place], [place] or [place]; or traveled in the [place] to [place], [place], [place], or [place]; in the last two weeks or you have been in close contact with a person diagnosed with COVID-19 in the last 2 weeks.   . Tell the health care staff about your symptoms: fever, cough and shortness of breath. . After you have been seen by a medical provider, you will be either: o Tested for (COVID-19) and discharged home on quarantine except to seek medical care if symptoms worsen, and asked to  - Stay home and avoid contact with others until you get your results (4-5 days)  - Avoid travel on public transportation if possible (such as bus, train, or airplane) or o Sent to the Emergency Department by EMS for evaluation, COVID-19 testing, and possible admission depending on your condition and test results.  What to do if you are LOW RISK for COVID-19?  Reduce your risk of any infection by using the same precautions used for avoiding the common cold or flu:  [name] Wash your hands often with soap and warm water for at least 20 seconds.  If soap and water are not readily available, use an alcohol-based hand sanitizer with at least 60% alcohol.  [name]  If coughing or sneezing, cover your mouth and nose by coughing or sneezing into the elbow areas of your shirt or coat, into a tissue or into your sleeve (not your hands). . Avoid shaking hands with others and consider head nods or verbal greetings only. . Avoid touching your eyes, nose, or mouth with unwashed hands.  . Avoid close contact with people who are sick. . Avoid places or events with large numbers of people in one location, like  concerts or sporting events. . Carefully consider travel plans you have or are making. . If you are planning any travel outside or inside the [place], visit the CDC's Travelers' Health webpage for the latest health notices. . If you have some symptoms but not all symptoms, continue to monitor at home and seek medical attention if your symptoms worsen. . If you are having a medical emergency, call 911.   [place] / e-Visit: [email or web address]         [facility]: [facility]: [phone]                   [facility]: [phone]

## 2019-02-27 NOTE — Progress Notes (Signed)
Per Dr. Irene Limbo: OK to administer Zometa with Calcium of 8.5. Pharmacy updated on plan

## 2019-03-06 ENCOUNTER — Other Ambulatory Visit: Payer: Self-pay

## 2019-03-06 ENCOUNTER — Inpatient Hospital Stay: Payer: Medicare Other

## 2019-03-06 VITALS — BP 141/77 | HR 70 | Temp 98.6°F | Resp 18 | Wt 217.2 lb

## 2019-03-06 DIAGNOSIS — C9002 Multiple myeloma in relapse: Secondary | ICD-10-CM

## 2019-03-06 DIAGNOSIS — D72819 Decreased white blood cell count, unspecified: Secondary | ICD-10-CM | POA: Diagnosis not present

## 2019-03-06 DIAGNOSIS — G893 Neoplasm related pain (acute) (chronic): Secondary | ICD-10-CM | POA: Diagnosis not present

## 2019-03-06 DIAGNOSIS — Z7189 Other specified counseling: Secondary | ICD-10-CM

## 2019-03-06 DIAGNOSIS — C9000 Multiple myeloma not having achieved remission: Secondary | ICD-10-CM

## 2019-03-06 DIAGNOSIS — Z79899 Other long term (current) drug therapy: Secondary | ICD-10-CM | POA: Diagnosis not present

## 2019-03-06 DIAGNOSIS — Z5112 Encounter for antineoplastic immunotherapy: Secondary | ICD-10-CM | POA: Diagnosis not present

## 2019-03-06 LAB — CBC WITH DIFFERENTIAL/PLATELET
Abs Immature Granulocytes: 0.01 10*3/uL (ref 0.00–0.07)
Basophils Absolute: 0 10*3/uL (ref 0.0–0.1)
Basophils Relative: 0 %
Eosinophils Absolute: 0.1 10*3/uL (ref 0.0–0.5)
Eosinophils Relative: 2 %
HCT: 33.5 % — ABNORMAL LOW (ref 39.0–52.0)
Hemoglobin: 11.2 g/dL — ABNORMAL LOW (ref 13.0–17.0)
Immature Granulocytes: 0 %
Lymphocytes Relative: 13 %
Lymphs Abs: 0.4 10*3/uL — ABNORMAL LOW (ref 0.7–4.0)
MCH: 32.7 pg (ref 26.0–34.0)
MCHC: 33.4 g/dL (ref 30.0–36.0)
MCV: 98 fL (ref 80.0–100.0)
Monocytes Absolute: 0.4 10*3/uL (ref 0.1–1.0)
Monocytes Relative: 12 %
Neutro Abs: 2.1 10*3/uL (ref 1.7–7.7)
Neutrophils Relative %: 73 %
Platelets: 126 10*3/uL — ABNORMAL LOW (ref 150–400)
RBC: 3.42 MIL/uL — ABNORMAL LOW (ref 4.22–5.81)
RDW: 13.5 % (ref 11.5–15.5)
WBC: 2.9 10*3/uL — ABNORMAL LOW (ref 4.0–10.5)
nRBC: 0 % (ref 0.0–0.2)

## 2019-03-06 LAB — CMP (CANCER CENTER ONLY)
ALT: 11 U/L (ref 0–44)
AST: 10 U/L — ABNORMAL LOW (ref 15–41)
Albumin: 3.6 g/dL (ref 3.5–5.0)
Alkaline Phosphatase: 68 U/L (ref 38–126)
Anion gap: 6 (ref 5–15)
BUN: 8 mg/dL (ref 8–23)
CO2: 26 mmol/L (ref 22–32)
Calcium: 8.3 mg/dL — ABNORMAL LOW (ref 8.9–10.3)
Chloride: 109 mmol/L (ref 98–111)
Creatinine: 0.68 mg/dL (ref 0.61–1.24)
GFR, Est AFR Am: >60 mL/min (ref >60)
GFR, Estimated: >60 mL/min (ref >60)
Glucose, Bld: 86 mg/dL (ref 70–99)
Potassium: 3.7 mmol/L (ref 3.5–5.1)
Sodium: 141 mmol/L (ref 135–145)
Total Bilirubin: 1.3 mg/dL — ABNORMAL HIGH (ref 0.3–1.2)
Total Protein: 6.1 g/dL — ABNORMAL LOW (ref 6.5–8.1)

## 2019-03-06 MED ORDER — SODIUM CHLORIDE 0.9 % IV SOLN
Freq: Once | INTRAVENOUS | Status: AC
  Administered 2019-03-06: 15:00:00 via INTRAVENOUS
  Filled 2019-03-06: qty 250

## 2019-03-06 MED ORDER — SODIUM CHLORIDE 0.9 % IV SOLN
Freq: Once | INTRAVENOUS | Status: AC
  Administered 2019-03-06: 16:00:00 via INTRAVENOUS
  Filled 2019-03-06: qty 250

## 2019-03-06 MED ORDER — DEXTROSE 5 % IV SOLN
55.0000 mg/m2 | Freq: Once | INTRAVENOUS | Status: AC
  Administered 2019-03-06: 130 mg via INTRAVENOUS
  Filled 2019-03-06: qty 60

## 2019-03-06 MED ORDER — PROCHLORPERAZINE MALEATE 10 MG PO TABS
10.0000 mg | ORAL_TABLET | Freq: Once | ORAL | Status: AC
  Administered 2019-03-06: 15:00:00 10 mg via ORAL

## 2019-03-06 MED ORDER — PROCHLORPERAZINE MALEATE 10 MG PO TABS
ORAL_TABLET | ORAL | Status: AC
  Filled 2019-03-06: qty 1

## 2019-03-06 NOTE — Patient Instructions (Signed)
[facility] Discharge Instructions for Patients Receiving Chemotherapy  Today you received the following chemotherapy agents:  Kyprolis  To help prevent nausea and vomiting after your treatment, we encourage you to take your nausea medication as directed.   If you develop nausea and vomiting that is not controlled by your nausea medication, call the clinic.   BELOW ARE SYMPTOMS THAT SHOULD BE REPORTED IMMEDIATELY:  *FEVER GREATER THAN 100.5 F  *CHILLS WITH OR WITHOUT FEVER  NAUSEA AND VOMITING THAT IS NOT CONTROLLED WITH YOUR NAUSEA MEDICATION  *UNUSUAL SHORTNESS OF BREATH  *UNUSUAL BRUISING OR BLEEDING  TENDERNESS IN MOUTH AND THROAT WITH OR WITHOUT PRESENCE OF ULCERS  *URINARY PROBLEMS  *BOWEL PROBLEMS  UNUSUAL RASH Items with * indicate a potential emergency and should be followed up as soon as possible.  Feel free to call the clinic should you have any questions or concerns. The clinic phone number is [phone].  Please show the CHEMO ALERT CARD at check-in to the Emergency Department and triage nurse.   

## 2019-03-07 LAB — MULTIPLE MYELOMA PANEL, SERUM
Albumin SerPl Elph-Mcnc: 3.4 g/dL (ref 2.9–4.4)
Albumin/Glob SerPl: 1.6 (ref 0.7–1.7)
Alpha 1: 0.2 g/dL (ref 0.0–0.4)
Alpha2 Glob SerPl Elph-Mcnc: 0.7 g/dL (ref 0.4–1.0)
B-Globulin SerPl Elph-Mcnc: 0.7 g/dL (ref 0.7–1.3)
Gamma Glob SerPl Elph-Mcnc: 0.6 g/dL (ref 0.4–1.8)
Globulin, Total: 2.2 g/dL (ref 2.2–3.9)
IgA: 6 mg/dL — ABNORMAL LOW (ref 61–437)
IgG (Immunoglobin G), Serum: 735 mg/dL (ref 603–1613)
IgM (Immunoglobulin M), Srm: 5 mg/dL — ABNORMAL LOW (ref 20–172)
M Protein SerPl Elph-Mcnc: 0.4 g/dL — ABNORMAL HIGH
Total Protein ELP: 5.6 g/dL — ABNORMAL LOW (ref 6.0–8.5)
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
## 2019-03-12 ENCOUNTER — Inpatient Hospital Stay (HOSPITAL_BASED_OUTPATIENT_CLINIC_OR_DEPARTMENT_OTHER): Payer: Medicare Other | Admitting: Hematology

## 2019-03-12 ENCOUNTER — Other Ambulatory Visit: Payer: Self-pay

## 2019-03-12 ENCOUNTER — Inpatient Hospital Stay: Payer: Medicare Other

## 2019-03-12 VITALS — BP 146/71 | HR 70 | Temp 98.0°F | Resp 18 | Ht 73.0 in | Wt 217.4 lb

## 2019-03-12 DIAGNOSIS — D72819 Decreased white blood cell count, unspecified: Secondary | ICD-10-CM

## 2019-03-12 DIAGNOSIS — Z5112 Encounter for antineoplastic immunotherapy: Secondary | ICD-10-CM | POA: Diagnosis not present

## 2019-03-12 DIAGNOSIS — Z7189 Other specified counseling: Secondary | ICD-10-CM

## 2019-03-12 DIAGNOSIS — C9002 Multiple myeloma in relapse: Secondary | ICD-10-CM

## 2019-03-12 DIAGNOSIS — C9000 Multiple myeloma not having achieved remission: Secondary | ICD-10-CM

## 2019-03-12 DIAGNOSIS — Z79899 Other long term (current) drug therapy: Secondary | ICD-10-CM

## 2019-03-12 DIAGNOSIS — G893 Neoplasm related pain (acute) (chronic): Secondary | ICD-10-CM

## 2019-03-12 LAB — CBC WITH DIFFERENTIAL/PLATELET
Abs Immature Granulocytes: 0.01 10*3/uL (ref 0.00–0.07)
Basophils Absolute: 0 10*3/uL (ref 0.0–0.1)
Basophils Relative: 0 %
Eosinophils Absolute: 0.1 10*3/uL (ref 0.0–0.5)
Eosinophils Relative: 2 %
HCT: 34.5 % — ABNORMAL LOW (ref 39.0–52.0)
Hemoglobin: 11.3 g/dL — ABNORMAL LOW (ref 13.0–17.0)
Immature Granulocytes: 0 %
Lymphocytes Relative: 16 %
Lymphs Abs: 0.5 10*3/uL — ABNORMAL LOW (ref 0.7–4.0)
MCH: 32.7 pg (ref 26.0–34.0)
MCHC: 32.8 g/dL (ref 30.0–36.0)
MCV: 99.7 fL (ref 80.0–100.0)
Monocytes Absolute: 0.3 10*3/uL (ref 0.1–1.0)
Monocytes Relative: 10 %
Neutro Abs: 2.1 10*3/uL (ref 1.7–7.7)
Neutrophils Relative %: 72 %
Platelets: 150 10*3/uL (ref 150–400)
RBC: 3.46 MIL/uL — ABNORMAL LOW (ref 4.22–5.81)
RDW: 13.3 % (ref 11.5–15.5)
WBC: 3 10*3/uL — ABNORMAL LOW (ref 4.0–10.5)
nRBC: 0 % (ref 0.0–0.2)

## 2019-03-12 LAB — CMP (CANCER CENTER ONLY)
ALT: 12 U/L (ref 0–44)
AST: 11 U/L — ABNORMAL LOW (ref 15–41)
Albumin: 3.7 g/dL (ref 3.5–5.0)
Alkaline Phosphatase: 58 U/L (ref 38–126)
Anion gap: 9 (ref 5–15)
BUN: 9 mg/dL (ref 8–23)
CO2: 25 mmol/L (ref 22–32)
Calcium: 8.7 mg/dL — ABNORMAL LOW (ref 8.9–10.3)
Chloride: 110 mmol/L (ref 98–111)
Creatinine: 0.78 mg/dL (ref 0.61–1.24)
GFR, Est AFR Am: >60 mL/min (ref >60)
GFR, Estimated: >60 mL/min (ref >60)
Glucose, Bld: 148 mg/dL — ABNORMAL HIGH (ref 70–99)
Potassium: 3.6 mmol/L (ref 3.5–5.1)
Sodium: 144 mmol/L (ref 135–145)
Total Bilirubin: 0.8 mg/dL (ref 0.3–1.2)
Total Protein: 6.3 g/dL — ABNORMAL LOW (ref 6.5–8.1)

## 2019-03-12 MED ORDER — DEXAMETHASONE 4 MG PO TABS: ORAL_TABLET | ORAL | 4 refills | Status: DC

## 2019-03-13 ENCOUNTER — Telehealth: Payer: Self-pay | Admitting: Hematology

## 2019-03-13 ENCOUNTER — Inpatient Hospital Stay: Payer: Medicare Other

## 2019-03-13 ENCOUNTER — Other Ambulatory Visit: Payer: Self-pay

## 2019-03-13 VITALS — BP 158/85 | HR 61 | Temp 98.7°F | Resp 18

## 2019-03-13 DIAGNOSIS — C9002 Multiple myeloma in relapse: Secondary | ICD-10-CM | POA: Diagnosis not present

## 2019-03-13 DIAGNOSIS — Z7189 Other specified counseling: Secondary | ICD-10-CM

## 2019-03-13 DIAGNOSIS — D72819 Decreased white blood cell count, unspecified: Secondary | ICD-10-CM | POA: Diagnosis not present

## 2019-03-13 DIAGNOSIS — Z79899 Other long term (current) drug therapy: Secondary | ICD-10-CM | POA: Diagnosis not present

## 2019-03-13 DIAGNOSIS — G893 Neoplasm related pain (acute) (chronic): Secondary | ICD-10-CM | POA: Diagnosis not present

## 2019-03-13 DIAGNOSIS — C9000 Multiple myeloma not having achieved remission: Secondary | ICD-10-CM

## 2019-03-13 DIAGNOSIS — Z5112 Encounter for antineoplastic immunotherapy: Secondary | ICD-10-CM | POA: Diagnosis not present

## 2019-03-13 MED ORDER — METHYLPREDNISOLONE SODIUM SUCC 125 MG IJ SOLR
100.0000 mg | Freq: Once | INTRAMUSCULAR | Status: AC
  Administered 2019-03-13: 09:00:00 100 mg via INTRAVENOUS

## 2019-03-13 MED ORDER — METHYLPREDNISOLONE SODIUM SUCC 125 MG IJ SOLR
INTRAMUSCULAR | Status: AC
  Filled 2019-03-13: qty 2

## 2019-03-13 MED ORDER — PROCHLORPERAZINE MALEATE 10 MG PO TABS
ORAL_TABLET | ORAL | Status: AC
  Filled 2019-03-13: qty 1

## 2019-03-13 MED ORDER — DEXTROSE 5 % IV SOLN
55.0000 mg/m2 | Freq: Once | INTRAVENOUS | Status: DC
  Filled 2019-03-13: qty 65

## 2019-03-13 MED ORDER — SODIUM CHLORIDE 0.9 % IV SOLN
Freq: Once | INTRAVENOUS | Status: AC
  Administered 2019-03-13: 09:00:00 via INTRAVENOUS
  Filled 2019-03-13: qty 250

## 2019-03-13 MED ORDER — ACETAMINOPHEN 325 MG PO TABS
ORAL_TABLET | ORAL | Status: AC
  Filled 2019-03-13: qty 2

## 2019-03-13 MED ORDER — DIPHENHYDRAMINE HCL 25 MG PO CAPS
50.0000 mg | ORAL_CAPSULE | Freq: Once | ORAL | Status: AC
  Administered 2019-03-13: 50 mg via ORAL

## 2019-03-13 MED ORDER — DIPHENHYDRAMINE HCL 25 MG PO CAPS
ORAL_CAPSULE | ORAL | Status: AC
  Filled 2019-03-13: qty 2

## 2019-03-13 MED ORDER — FAMOTIDINE IN NACL 20-0.9 MG/50ML-% IV SOLN
20.0000 mg | Freq: Once | INTRAVENOUS | Status: AC
  Administered 2019-03-13: 20 mg via INTRAVENOUS

## 2019-03-13 MED ORDER — FAMOTIDINE IN NACL 20-0.9 MG/50ML-% IV SOLN
INTRAVENOUS | Status: AC
  Filled 2019-03-13: qty 50

## 2019-03-13 MED ORDER — SODIUM CHLORIDE 0.9 % IV SOLN
15.6000 mg/kg | Freq: Once | INTRAVENOUS | Status: AC
  Administered 2019-03-13: 11:00:00 1700 mg via INTRAVENOUS
  Filled 2019-03-13: qty 80

## 2019-03-13 MED ORDER — PROCHLORPERAZINE MALEATE 10 MG PO TABS
10.0000 mg | ORAL_TABLET | Freq: Once | ORAL | Status: AC
  Administered 2019-03-13: 10 mg via ORAL

## 2019-03-13 MED ORDER — DEXTROSE 5 % IV SOLN
55.0000 mg/m2 | Freq: Once | INTRAVENOUS | Status: AC
  Administered 2019-03-13: 10:00:00 130 mg via INTRAVENOUS
  Filled 2019-03-13: qty 60

## 2019-03-13 MED ORDER — MONTELUKAST SODIUM 10 MG PO TABS
10.0000 mg | ORAL_TABLET | Freq: Once | ORAL | Status: AC
  Administered 2019-03-13: 09:00:00 10 mg via ORAL

## 2019-03-13 MED ORDER — MONTELUKAST SODIUM 10 MG PO TABS
ORAL_TABLET | ORAL | Status: AC
  Filled 2019-03-13: qty 1

## 2019-03-13 MED ORDER — ACETAMINOPHEN 325 MG PO TABS
650.0000 mg | ORAL_TABLET | Freq: Once | ORAL | Status: AC
  Administered 2019-03-13: 09:00:00 650 mg via ORAL

## 2019-03-13 NOTE — Patient Instructions (Signed)
[facility] Discharge Instructions for Patients Receiving Chemotherapy  Today you received the following chemotherapy agents: Kyprolis and Darzalex  To help prevent nausea and vomiting after your treatment, we encourage you to take your nausea medication: As directed by your MD   If you develop nausea and vomiting that is not controlled by your nausea medication, call the clinic.   BELOW ARE SYMPTOMS THAT SHOULD BE REPORTED IMMEDIATELY:  *FEVER GREATER THAN 100.5 F  *CHILLS WITH OR WITHOUT FEVER  NAUSEA AND VOMITING THAT IS NOT CONTROLLED WITH YOUR NAUSEA MEDICATION  *UNUSUAL SHORTNESS OF BREATH  *UNUSUAL BRUISING OR BLEEDING  TENDERNESS IN MOUTH AND THROAT WITH OR WITHOUT PRESENCE OF ULCERS  *URINARY PROBLEMS  *BOWEL PROBLEMS  UNUSUAL RASH Items with * indicate a potential emergency and should be followed up as soon as possible.  Feel free to call the clinic should you have any questions or concerns. The clinic phone number is [phone].  Please show the [facility] at check-in to the Emergency Department and triage nurse.  Coronavirus (COVID-19) Are you at risk?  Are you at risk for the Coronavirus (COVID-19)?  To be considered HIGH RISK for Coronavirus (COVID-19), you have to meet the following criteria:  . Traveled to [place], [place], [place], [place] or [place]; or in the [place] to [place], [place], [place], or [place]; and have fever, cough, and shortness of breath within the last 2 weeks of travel OR . Been in close contact with a person diagnosed with COVID-19 within the last 2 weeks and have fever, cough, and shortness of breath . IF YOU DO NOT MEET THESE CRITERIA, YOU ARE CONSIDERED LOW RISK FOR COVID-19.  What to do if you are HIGH RISK for COVID-19?  [name] If you are having a medical emergency, call 911. . Seek medical care right away. Before you go to a doctor's office, urgent care or emergency department, call ahead and  tell them about your recent travel, contact with someone diagnosed with COVID-19, and your symptoms. You should receive instructions from your physician's office regarding next steps of care.  . When you arrive at healthcare provider, tell the healthcare staff immediately you have returned from visiting [place], [place], [place], [place] or [place]; or traveled in the [place] to [place], [place], [place], or [place]; in the last two weeks or you have been in close contact with a person diagnosed with COVID-19 in the last 2 weeks.   . Tell the health care staff about your symptoms: fever, cough and shortness of breath. . After you have been seen by a medical provider, you will be either: o Tested for (COVID-19) and discharged home on quarantine except to seek medical care if symptoms worsen, and asked to  - Stay home and avoid contact with others until you get your results (4-5 days)  - Avoid travel on public transportation if possible (such as bus, train, or airplane) or o Sent to the Emergency Department by EMS for evaluation, COVID-19 testing, and possible admission depending on your condition and test results.  What to do if you are LOW RISK for COVID-19?  Reduce your risk of any infection by using the same precautions used for avoiding the common cold or flu:  [name] Wash your hands often with soap and warm water for at least 20 seconds.  If soap and water are not readily available, use an alcohol-based hand sanitizer with at least 60% alcohol.  [name]  If coughing or sneezing, cover your mouth and nose by coughing or sneezing into the elbow areas of your shirt or coat, into a tissue or into your sleeve (not your hands). . Avoid shaking hands with others and consider head nods or verbal greetings only. . Avoid touching your eyes, nose, or mouth with unwashed hands.  . Avoid close contact with people who are sick. . Avoid places or events with large numbers of people in one location, like  concerts or sporting events. . Carefully consider travel plans you have or are making. . If you are planning any travel outside or inside the [place], visit the CDC's Travelers' Health webpage for the latest health notices. . If you have some symptoms but not all symptoms, continue to monitor at home and seek medical attention if your symptoms worsen. . If you are having a medical emergency, call 911.   [place] / e-Visit: [email or web address]         [facility]: [facility]: [phone]                   [facility]: [phone]

## 2019-03-13 NOTE — Telephone Encounter (Signed)
Scheduled appt per 5/26 los.  Appts were already made had to make a few adjustments, ytreied contacting the patient but the patient did not answer.

## 2019-03-25 DIAGNOSIS — R7303 Prediabetes: Secondary | ICD-10-CM | POA: Diagnosis not present

## 2019-03-25 DIAGNOSIS — Z0000 Encounter for general adult medical examination without abnormal findings: Secondary | ICD-10-CM | POA: Diagnosis not present

## 2019-03-25 DIAGNOSIS — E7800 Pure hypercholesterolemia, unspecified: Secondary | ICD-10-CM | POA: Diagnosis not present

## 2019-03-26 ENCOUNTER — Ambulatory Visit: Payer: Medicare Other | Admitting: Hematology

## 2019-03-26 ENCOUNTER — Other Ambulatory Visit: Payer: Medicare Other

## 2019-03-27 ENCOUNTER — Inpatient Hospital Stay: Payer: Medicare Other

## 2019-03-27 ENCOUNTER — Ambulatory Visit: Payer: Medicare Other

## 2019-03-27 ENCOUNTER — Other Ambulatory Visit: Payer: Medicare Other

## 2019-03-27 ENCOUNTER — Other Ambulatory Visit: Payer: Self-pay

## 2019-03-27 ENCOUNTER — Inpatient Hospital Stay: Payer: Medicare Other | Attending: Hematology

## 2019-03-27 VITALS — BP 120/74 | HR 67 | Temp 98.5°F | Resp 16 | Ht 73.0 in | Wt 214.5 lb

## 2019-03-27 DIAGNOSIS — Z5112 Encounter for antineoplastic immunotherapy: Secondary | ICD-10-CM | POA: Diagnosis not present

## 2019-03-27 DIAGNOSIS — R932 Abnormal findings on diagnostic imaging of liver and biliary tract: Secondary | ICD-10-CM | POA: Diagnosis not present

## 2019-03-27 DIAGNOSIS — Z7189 Other specified counseling: Secondary | ICD-10-CM

## 2019-03-27 DIAGNOSIS — R9389 Abnormal findings on diagnostic imaging of other specified body structures: Secondary | ICD-10-CM | POA: Diagnosis not present

## 2019-03-27 DIAGNOSIS — C9002 Multiple myeloma in relapse: Secondary | ICD-10-CM | POA: Diagnosis not present

## 2019-03-27 DIAGNOSIS — C9000 Multiple myeloma not having achieved remission: Secondary | ICD-10-CM

## 2019-03-27 LAB — CBC WITH DIFFERENTIAL/PLATELET
Abs Immature Granulocytes: 0.01 10*3/uL (ref 0.00–0.07)
Basophils Absolute: 0 10*3/uL (ref 0.0–0.1)
Basophils Relative: 1 %
Eosinophils Absolute: 0.1 10*3/uL (ref 0.0–0.5)
Eosinophils Relative: 2 %
HCT: 35.9 % — ABNORMAL LOW (ref 39.0–52.0)
Hemoglobin: 11.8 g/dL — ABNORMAL LOW (ref 13.0–17.0)
Immature Granulocytes: 0 %
Lymphocytes Relative: 8 %
Lymphs Abs: 0.4 10*3/uL — ABNORMAL LOW (ref 0.7–4.0)
MCH: 32.5 pg (ref 26.0–34.0)
MCHC: 32.9 g/dL (ref 30.0–36.0)
MCV: 98.9 fL (ref 80.0–100.0)
Monocytes Absolute: 0.4 10*3/uL (ref 0.1–1.0)
Monocytes Relative: 8 %
Neutro Abs: 3.9 10*3/uL (ref 1.7–7.7)
Neutrophils Relative %: 81 %
Platelets: 214 10*3/uL (ref 150–400)
RBC: 3.63 MIL/uL — ABNORMAL LOW (ref 4.22–5.81)
RDW: 13.8 % (ref 11.5–15.5)
WBC: 4.8 10*3/uL (ref 4.0–10.5)
nRBC: 0 % (ref 0.0–0.2)

## 2019-03-27 LAB — CMP (CANCER CENTER ONLY)
ALT: 12 U/L (ref 0–44)
AST: 12 U/L — ABNORMAL LOW (ref 15–41)
Albumin: 4.1 g/dL (ref 3.5–5.0)
Alkaline Phosphatase: 76 U/L (ref 38–126)
Anion gap: 10 (ref 5–15)
BUN: 11 mg/dL (ref 8–23)
CO2: 21 mmol/L — ABNORMAL LOW (ref 22–32)
Calcium: 8.8 mg/dL — ABNORMAL LOW (ref 8.9–10.3)
Chloride: 107 mmol/L (ref 98–111)
Creatinine: 0.71 mg/dL (ref 0.61–1.24)
GFR, Est AFR Am: >60 mL/min (ref >60)
GFR, Estimated: >60 mL/min (ref >60)
Glucose, Bld: 112 mg/dL — ABNORMAL HIGH (ref 70–99)
Potassium: 3.8 mmol/L (ref 3.5–5.1)
Sodium: 138 mmol/L (ref 135–145)
Total Bilirubin: 1 mg/dL (ref 0.3–1.2)
Total Protein: 6.5 g/dL (ref 6.5–8.1)

## 2019-03-27 MED ORDER — DIPHENHYDRAMINE HCL 25 MG PO CAPS
ORAL_CAPSULE | ORAL | Status: AC
  Filled 2019-03-27: qty 2

## 2019-03-27 MED ORDER — MONTELUKAST SODIUM 10 MG PO TABS
ORAL_TABLET | ORAL | Status: AC
  Filled 2019-03-27: qty 1

## 2019-03-27 MED ORDER — ACETAMINOPHEN 325 MG PO TABS
650.0000 mg | ORAL_TABLET | Freq: Once | ORAL | Status: AC
  Administered 2019-03-27: 10:00:00 650 mg via ORAL

## 2019-03-27 MED ORDER — SODIUM CHLORIDE 0.9 % IV SOLN
15.6000 mg/kg | Freq: Once | INTRAVENOUS | Status: AC
  Administered 2019-03-27: 12:00:00 1700 mg via INTRAVENOUS
  Filled 2019-03-27: qty 80

## 2019-03-27 MED ORDER — DEXTROSE 5 % IV SOLN
55.0000 mg/m2 | Freq: Once | INTRAVENOUS | Status: AC
  Administered 2019-03-27: 11:00:00 130 mg via INTRAVENOUS
  Filled 2019-03-27: qty 60

## 2019-03-27 MED ORDER — FAMOTIDINE IN NACL 20-0.9 MG/50ML-% IV SOLN
20.0000 mg | Freq: Once | INTRAVENOUS | Status: AC
  Administered 2019-03-27: 10:00:00 20 mg via INTRAVENOUS

## 2019-03-27 MED ORDER — DIPHENHYDRAMINE HCL 25 MG PO CAPS
50.0000 mg | ORAL_CAPSULE | Freq: Once | ORAL | Status: AC
  Administered 2019-03-27: 10:00:00 50 mg via ORAL

## 2019-03-27 MED ORDER — METHYLPREDNISOLONE SODIUM SUCC 125 MG IJ SOLR
INTRAMUSCULAR | Status: AC
  Filled 2019-03-27: qty 2

## 2019-03-27 MED ORDER — PROCHLORPERAZINE MALEATE 10 MG PO TABS
10.0000 mg | ORAL_TABLET | Freq: Once | ORAL | Status: AC
  Administered 2019-03-27: 10:00:00 10 mg via ORAL

## 2019-03-27 MED ORDER — ACETAMINOPHEN 325 MG PO TABS
ORAL_TABLET | ORAL | Status: AC
  Filled 2019-03-27: qty 2

## 2019-03-27 MED ORDER — PROCHLORPERAZINE MALEATE 10 MG PO TABS
ORAL_TABLET | ORAL | Status: AC
  Filled 2019-03-27: qty 1

## 2019-03-27 MED ORDER — MONTELUKAST SODIUM 10 MG PO TABS
10.0000 mg | ORAL_TABLET | Freq: Once | ORAL | Status: AC
  Administered 2019-03-27: 10:00:00 10 mg via ORAL

## 2019-03-27 MED ORDER — SODIUM CHLORIDE 0.9 % IV SOLN
Freq: Once | INTRAVENOUS | Status: AC
  Administered 2019-03-27: 09:00:00 via INTRAVENOUS
  Filled 2019-03-27: qty 250

## 2019-03-27 MED ORDER — METHYLPREDNISOLONE SODIUM SUCC 125 MG IJ SOLR
100.0000 mg | Freq: Once | INTRAMUSCULAR | Status: AC
  Administered 2019-03-27: 100 mg via INTRAVENOUS

## 2019-03-27 MED ORDER — FAMOTIDINE IN NACL 20-0.9 MG/50ML-% IV SOLN
INTRAVENOUS | Status: AC
  Filled 2019-03-27: qty 50

## 2019-03-27 MED ORDER — ZOLEDRONIC ACID 4 MG/100ML IV SOLN
4.0000 mg | Freq: Once | INTRAVENOUS | Status: AC
  Administered 2019-03-27: 11:00:00 4 mg via INTRAVENOUS
  Filled 2019-03-27: qty 100

## 2019-03-27 NOTE — Patient Instructions (Signed)
[facility] Discharge Instructions for Patients Receiving Chemotherapy  Today you received the following chemotherapy agents:  carfilzomib (Kyprolis), daratumumab (Darzalex)  To help prevent nausea and vomiting after your treatment, we encourage you to take your nausea medication as prescribed.   If you develop nausea and vomiting that is not controlled by your nausea medication, call the clinic.   BELOW ARE SYMPTOMS THAT SHOULD BE REPORTED IMMEDIATELY:  *FEVER GREATER THAN 100.5 F  *CHILLS WITH OR WITHOUT FEVER  NAUSEA AND VOMITING THAT IS NOT CONTROLLED WITH YOUR NAUSEA MEDICATION  *UNUSUAL SHORTNESS OF BREATH  *UNUSUAL BRUISING OR BLEEDING  TENDERNESS IN MOUTH AND THROAT WITH OR WITHOUT PRESENCE OF ULCERS  *URINARY PROBLEMS  *BOWEL PROBLEMS  UNUSUAL RASH Items with * indicate a potential emergency and should be followed up as soon as possible.  Feel free to call the clinic should you have any questions or concerns. The clinic phone number is [phone].  Please show the [facility] at check-in to the Emergency Department and triage nurse.  Coronavirus (COVID-19) Are you at risk?  Are you at risk for the Coronavirus (COVID-19)?  To be considered HIGH RISK for Coronavirus (COVID-19), you have to meet the following criteria:  . Traveled to [place], [place], [place], [place] or [place]; or in the [place] to [place], [place], [place], or [place]; and have fever, cough, and shortness of breath within the last 2 weeks of travel OR . Been in close contact with a person diagnosed with COVID-19 within the last 2 weeks and have fever, cough, and shortness of breath . IF YOU DO NOT MEET THESE CRITERIA, YOU ARE CONSIDERED LOW RISK FOR COVID-19.  What to do if you are HIGH RISK for COVID-19?  [name] If you are having a medical emergency, call 911. . Seek medical care right away. Before you go to a doctor's office, urgent care or emergency department,  call ahead and tell them about your recent travel, contact with someone diagnosed with COVID-19, and your symptoms. You should receive instructions from your physician's office regarding next steps of care.  . When you arrive at healthcare provider, tell the healthcare staff immediately you have returned from visiting [place], [place], [place], [place] or [place]; or traveled in the [place] to [place], [place], [place], or [place]; in the last two weeks or you have been in close contact with a person diagnosed with COVID-19 in the last 2 weeks.   . Tell the health care staff about your symptoms: fever, cough and shortness of breath. . After you have been seen by a medical provider, you will be either: o Tested for (COVID-19) and discharged home on quarantine except to seek medical care if symptoms worsen, and asked to  - Stay home and avoid contact with others until you get your results (4-5 days)  - Avoid travel on public transportation if possible (such as bus, train, or airplane) or o Sent to the Emergency Department by EMS for evaluation, COVID-19 testing, and possible admission depending on your condition and test results.  What to do if you are LOW RISK for COVID-19?  Reduce your risk of any infection by using the same precautions used for avoiding the common cold or flu:  [name] Wash your hands often with soap and warm water for at least 20 seconds.  If soap and water are not readily available, use an alcohol-based hand sanitizer with at least 60% alcohol.  [name]  If coughing or sneezing, cover your mouth and nose by coughing or sneezing into the elbow areas of your shirt or coat, into a tissue or into your sleeve (not your hands). . Avoid shaking hands with others and consider head nods or verbal greetings only. . Avoid touching your eyes, nose, or mouth with unwashed hands.  . Avoid close contact with people who are sick. . Avoid places or events with large numbers of people in one  location, like concerts or sporting events. . Carefully consider travel plans you have or are making. . If you are planning any travel outside or inside the [place], visit the CDC's Travelers' Health webpage for the latest health notices. . If you have some symptoms but not all symptoms, continue to monitor at home and seek medical attention if your symptoms worsen. . If you are having a medical emergency, call 911.   [place] / e-Visit: [email or web address]         [facility]: [facility]: [phone]                   [facility]: [phone]

## 2019-03-27 NOTE — Progress Notes (Signed)
Per Dr Irene Limbo OK for Zometa with Ca 8.8

## 2019-03-28 LAB — MULTIPLE MYELOMA PANEL, SERUM
Albumin SerPl Elph-Mcnc: 3.6 g/dL (ref 2.9–4.4)
Albumin/Glob SerPl: 1.7 (ref 0.7–1.7)
Alpha 1: 0.2 g/dL (ref 0.0–0.4)
Alpha2 Glob SerPl Elph-Mcnc: 0.7 g/dL (ref 0.4–1.0)
B-Globulin SerPl Elph-Mcnc: 0.7 g/dL (ref 0.7–1.3)
Gamma Glob SerPl Elph-Mcnc: 0.6 g/dL (ref 0.4–1.8)
Globulin, Total: 2.2 g/dL (ref 2.2–3.9)
IgA: 6 mg/dL — ABNORMAL LOW (ref 61–437)
IgG (Immunoglobin G), Serum: 668 mg/dL (ref 603–1613)
IgM (Immunoglobulin M), Srm: <5 mg/dL — ABNORMAL LOW (ref 20–172)
M Protein SerPl Elph-Mcnc: 0.4 g/dL — ABNORMAL HIGH
Total Protein ELP: 5.8 g/dL — ABNORMAL LOW (ref 6.0–8.5)

## 2019-03-29 DIAGNOSIS — G4733 Obstructive sleep apnea (adult) (pediatric): Secondary | ICD-10-CM | POA: Diagnosis not present

## 2019-03-29 DIAGNOSIS — E663 Overweight: Secondary | ICD-10-CM | POA: Diagnosis not present

## 2019-03-29 DIAGNOSIS — C9000 Multiple myeloma not having achieved remission: Secondary | ICD-10-CM | POA: Diagnosis not present

## 2019-03-29 DIAGNOSIS — K219 Gastro-esophageal reflux disease without esophagitis: Secondary | ICD-10-CM | POA: Diagnosis not present

## 2019-03-29 DIAGNOSIS — E7800 Pure hypercholesterolemia, unspecified: Secondary | ICD-10-CM | POA: Diagnosis not present

## 2019-04-01 ENCOUNTER — Telehealth: Payer: Self-pay | Admitting: *Deleted

## 2019-04-01 NOTE — Telephone Encounter (Signed)
Received records request from [name] patient care coord to fax last 2 months OV and labs/scans to [facility] for f/u (patient participating in [place] study). Records faxed and fax confirmation recieved

## 2019-04-02 NOTE — Progress Notes (Signed)
HEMATOLOGY/ONCOLOGY CLINIC NOTE  Date of Service: 04/03/19     Patient Care Team: [name], MD as PCP - General (Family Medicine) [name], MD as Consulting Physician (Hematology) [name], MD as Attending Physician (Radiation Oncology) [name], RN as Oncology Nurse Navigator (Oncology) [name] MD ([facility] _ primary oncology) phone number [phone] Email- [email or web address]  CHIEF COMPLAINTS  followup of multiple myeloma.  HISTORY OF PRESENTING ILLNESS:   Michael [name] is a wonderful 68 y.o. 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 Patient is currently on a study protocol and is being monitored for myeloma recurrence.  Based on available records being put together  -Patient was diagnosed with IgG kappa ISS stage I multiple myeloma with hyperdiploid complex cytogenetics in 2012. Bone marrow biopsy apparently showed 50% kappa restricted plasma cells with an initial M spike of 3.4 g/dL with evidence of lytic lesions on his PET/CT scan including right rib fracture and lesions of the lumbar and cervical spine. -Patient was treated under protocol 11-C-0221 with from 02/21/2013 with  Carfilzomib/Revlimid/Dexamethasone for a total of 8 cycles. He reports that his stem cells were collected and stored after 4 cycles -Posttreatment bone marrow biopsy showed less than 5% plasma cells with a negative flow cytometry and improvement in his previously PET avid lesions. Some concern for new focus of activity at C2 lytic lesion at L4 and several small lesions throughout the vertebrae. -Patient was on maintenance lenalidomide 10 mg by  mouth daily for 2 years or more until end of 2014. 08/19/2013 - bone marrow biopsy showed normocellular marrow with less than 5% plasma cells. PET CT scan showed decreased focal metabolic ligament prior rib lesions and no new lesions. Flow cytometry was negative for residual disease. Serum and urine electrophoresis and IFE showed no evidence of monoclonal protein. -Patient notes that he was off protocol for a few years due to lack of clinical trial research funding. -Patient notes that he is back on a follow-up protocol and continues to follow and receive his primary treatment [facility] at this time. -Patient reports he had his last PET CT scan blood and urine tests and bone marrow examination in February 2018. The results of which are not available to [place] currently.  He notes that there was concern for a very slowly growing sternal FDG avid lesion that has been present for years. He has a follow-up at [facility] next month to determine the management of this lesion. He reports that was some consideration of considering radiation. he notes that this lesion has not been biopsied. No other focal bone pains at this time.  We discussed in detail about what our role will be and noted that we shall be available for any acute issues that need to be taken care of locally but that at this time the primary treatment and evaluation will be driven by his team at [facility] as per their research protocols and input. The patient and his wife are clearly aware of this and are in agreement with this plan.  INTERVAL HISTORY  [name] comes is here for continued management of his Multiple Myeloma on C6D8 treatment. The patient's last visit with [place] was on  03/12/19. The pt reports that he is doing well overall.  The pt reports that he was able to contact Michael [name] and [facility] in the interim. He notes that his stem cells are still preserved.   The pt denies any new concerns in the interim. He notes that he has been  continuing to do his best to eat well, but endorses a lower appetite in general. The pt notes that his pain has continued to improve and he has been able to hold his Fentanyl patch and has not needed to use any Oxycodone in a couple weeks now.  Lab results today (04/03/19) of CBC w/diff and CMP is as follows: all values are WNL except for WBC at 3.9k, RBC at 3.48, HGB at 11.3, HCT at 34.1, PLT at 125k, Lymphs abs at 500, Glucose at 100, Calcium at 8.6, Total Protein at 6.1, AST at 11.  On review of systems, pt reports stable energy levels, much improved pain, and denies concerns for infections, and any other symptoms.    MEDICAL HISTORY:  Past Medical History:  Diagnosis Date  . Angioedema   . CPAP (continuous positive airway pressure) dependence    uses at night  . Gastroesophageal reflux disease without esophagitis   . History of radiation therapy 06/04/18- 06/15/18   25 Gy in 10 fractions to the laryngeal/ cartilage lesion  . Hyperlipidemia   . Multiple myeloma ([place]) 2012   Treated at [facility]  . Non morbid obesity due to excess calories   . Prediabetes   . Prediabetes   . Unspecified glaucoma(365.9)   . Urticaria, unspecified   Diabetes mellitus Glaucoma Left toes neuropathy  GERD  SURGICAL HISTORY: Past Surgical History:  Procedure Laterality Date  . IR RADIOLOGIST EVAL & MGMT  11/13/2018  . PORTACATH PLACEMENT    . PORTACATH PLACEMENT     removed 02/2016    SOCIAL HISTORY: Social History   Socioeconomic History  . Marital status: Married    Spouse name: Not on file  . Number of children: 2  . Years of education: Not on file  . Highest education level: Not on file  Occupational History  . Occupation: Retired Chief Strategy Officer for [employer]  . Financial resource strain: Not on file  . Food insecurity    Worry: Not on file    Inability: Not on file  . Transportation needs    Medical: Not on file    Non-medical: Not on file  Tobacco Use  . Smoking status:  Never Smoker  . Smokeless tobacco: Never Used  Substance and Sexual Activity  . Alcohol use: Yes    Alcohol/week: 0.0 standard drinks    Comment: Occasional beer  . Drug use: No  . Sexual activity: Not on file  Lifestyle  . Physical activity    Days per week: Not on file    Minutes per session: Not on file  . Stress: Not on file  Relationships  . Social Herbalist on phone: Not on file    Gets together: Not on file    Attends religious service: Not on file    Active member of club or organization: Not on file    Attends meetings of clubs or organizations: Not on file    Relationship status: Not on file  . Intimate partner violence    Fear of current or ex partner: No    Emotionally abused: No    Physically abused: No    Forced  sexual activity: No  Other Topics Concern  . Not on file  Social History Narrative   Unable to [employer] partner violence- partner in room   Never smoker Married Retired from the [employer] in June 2017 and moved to [facility] [place].  FAMILY HISTORY: Family History  Problem Relation Age of Onset  . Cancer Father     ALLERGIES:  has No Known Allergies.  MEDICATIONS:  Current Outpatient Medications  Medication Sig Dispense Refill  . acyclovir (ZOVIRAX) 400 MG tablet Take 1 tablet (400 mg total) by mouth 2 (two) times daily. 60 tablet 11  . aspirin 81 MG tablet Take 81 mg by mouth daily.    [name] atorvastatin (LIPITOR) 20 MG tablet Take 20 mg by mouth every evening.     [name] dexamethasone (DECADRON) 4 MG tablet 1m (5 tabs) with breakfast the day after each daratumumab treatment 20 tablet 4  . fentaNYL (DURAGESIC) 25 MCG/HR Place 1 patch onto the skin every 3 (three) days. 10 patch 0  . Lido-Capsaicin-Men-Methyl Sal (MEDI-PATCH-LIDOCAINE) 0.5-0.035-5-20 % PTCH Apply 1 patch topically 2 (two) times daily as needed. 30 patch 0  . Multiple Vitamin (MULTIVITAMIN) tablet Take 1 tablet by mouth daily.    .[name] KitchenNARCAN 4 MG/0.1ML LIQD  nasal spray kit Place 1 spray into the nose once.     . nystatin (MYCOSTATIN/NYSTOP) powder Apply 100,000 g topically daily as needed (rash).     . ondansetron (ZOFRAN) 8 MG tablet Take 1 tablet (8 mg total) by mouth 2 (two) times daily as needed (Nausea or vomiting). 30 tablet 1  . oxyCODONE (OXY IR/ROXICODONE) 5 MG immediate release tablet Take 1-2 tablets (5-10 mg total) by mouth every 4 (four) hours as needed for severe pain or breakthrough pain. 60 tablet 0  . pantoprazole (PROTONIX) 40 MG tablet Take 40 mg by mouth daily.     . polyethylene glycol (MIRALAX) packet Take 17 g by mouth daily.    . prochlorperazine (COMPAZINE) 10 MG tablet Take 1 tablet (10 mg total) by mouth every 6 (six) hours as needed (Nausea or vomiting). 30 tablet 1  . senna-docusate (SENOKOT-S) 8.6-50 MG tablet Take 2 tablets by mouth at bedtime. Takes 50 mg 60 tablet 3   No current facility-administered medications for this visit.     REVIEW OF SYSTEMS:    A 10+ POINT REVIEW OF SYSTEMS WAS OBTAINED including neurology, dermatology, psychiatry, cardiac, respiratory, lymph, extremities, GI, GU, Musculoskeletal, constitutional, breasts, reproductive, HEENT.  All pertinent positives are noted in the HPI.  All others are negative.   PHYSICAL EXAMINATION: ECOG PERFORMANCE STATUS: 1 - Symptomatic but completely ambulatory  Vitals:   04/03/19 1015  BP: (!) 147/84  Pulse: 69  Resp: 18  Temp: 98.7 F (37.1 C)  TempSrc: Oral  SpO2: 100%  Weight: 219 lb 9.6 oz (99.6 kg)  Height: 6' 1"  (1.854 m)   Body mass index is 28.97 kg/m.   GENERAL:alert, in no acute distress and comfortable SKIN: no acute rashes, no significant lesions EYES: conjunctiva are pink and non-injected, sclera anicteric OROPHARYNX: MMM, no exudates, no oropharyngeal erythema or ulceration NECK: supple, no JVD LYMPH:  no palpable lymphadenopathy in the cervical, axillary or inguinal regions LUNGS: clear to auscultation b/l with normal respiratory  effort HEART: regular rate & rhythm ABDOMEN:  normoactive bowel sounds , non tender, not distended. No palpable hepatosplenomegaly.  Extremity: no pedal edema PSYCH: alert & oriented x 3 with fluent speech NEURO: no focal motor/sensory deficits  LABORATORY DATA:  I have reviewed the data as listed  . CBC Latest Ref Rng & Units 04/03/2019 03/27/2019 03/12/2019  WBC 4.0 - 10.5 K/uL 3.9(L) 4.8 3.0(L)  Hemoglobin 13.0 - 17.0 g/dL 11.3(L) 11.8(L) 11.3(L)  Hematocrit 39.0 - 52.0 % 34.1(L) 35.9(L) 34.5(L)  Platelets 150 - 400 K/uL 125(L) 214 150  ANC 3000  . CMP Latest Ref Rng & Units 04/03/2019 03/27/2019 03/12/2019  Glucose 70 - 99 mg/dL 100(H) 112(H) 148(H)  BUN 8 - 23 mg/dL 11 11 9   Creatinine 0.61 - 1.24 mg/dL 0.71 0.71 0.78  Sodium 135 - 145 mmol/L 143 138 144  Potassium 3.5 - 5.1 mmol/L 3.7 3.8 3.6  Chloride 98 - 111 mmol/L 110 107 110  CO2 22 - 32 mmol/L 24 21(L) 25  Calcium 8.9 - 10.3 mg/dL 8.6(L) 8.8(L) 8.7(L)  Total Protein 6.5 - 8.1 g/dL 6.1(L) 6.5 6.3(L)  Total Bilirubin 0.3 - 1.2 mg/dL 1.0 1.0 0.8  Alkaline Phos 38 - 126 U/L 68 76 58  AST 15 - 41 U/L 11(L) 12(L) 11(L)  ALT 0 - 44 U/L 13 12 12      05/08/18 Left Neck Thyroid Cartilage Biopsy:        RADIOGRAPHIC STUDIES: I have personally reviewed the radiological images as listed and agreed with the findings in the report. No results found.  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 Bone marrow biopsy apparently showed 50% kappa restricted plasma cells with an initial M spike of 3.4 g/dL with evidence of lytic lesions on his PET/CT scan including right rib fracture and lesions of the lumbar and cervical spine. -Patient was treated under protocol 11-C-0221 with from 02/21/2013 with  Carfilzomib/Revlimid/Dexamethasone for a total of 8 cycles. He reports  that his stem cells were collected and stored after 4 cycles -Posttreatment bone marrow biopsy showed less than 5% plasma cells with a negative flow cytometry and improvement in his previously PET avid lesions. Some concern for new focus of activity at C2 lytic lesion at L4 and several small lesions throughout the vertebrae. -Patient was on maintenance lenalidomide 10 mg by mouth daily for 2 years or more until end of 2014. 08/19/2013 - bone marrow biopsy showed normocellular marrow with less than 5% plasma cells. PET CT scan showed decreased focal metabolic ligament prior rib lesions and no new lesions. Flow cytometry was negative for residual disease. Serum and urine electrophoresis and IFE showed no evidence of monoclonal protein. -Patient notes that he was off protocol for a few years due to lack of clinical trial research funding. -Patient returned to follow-up protocol and began to follow and receive his primary treatment at [facility]  -Patient's labs show minimal IgG kappa protein on IFE and a CT chest in May 2018 that did not show any overt signs of myeloma progression in the bones.  Rpt Myeloma labs 03/2017 - M spike of 0.3g/dl with a repeat M spike stable at 0.3 g/dL on 05/23/2017 which remains stable on labs today 07/25/17 Serum kappa lambda free light chain ratio not significantly changed. PET/CT scan showed a single metabolically active sternal lesion without any overt bony destruction. Bone marrow biopsy done on 06/06/2017 - shows no overt involvement with multiple myeloma.  04/24/18 CT Soft Tissue Neck revealed Left laryngeal 3 cm soft tissue mass appears to have originated within and is expanding  the left thyroid cartilage. This is new since the August 2018 PET-CT, and there is absent lymphadenopathy. 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 Primary cartilaginous neoplasm, squamous cell carcinoma, and other differential considerations are less likely.  Superimposed widespread multiple myeloma lesions in the visible skeleton.  05/08/18 Left neck thyroid cartilage biopsy revealed Plasma cell neoplasm.  05/22/18 PET/CT revealed Soft tissue mass centered around the destroyed left thyroid cartilage is hypermetabolic and consistent with known plasmacytoma. No adenopathy in the neck. 2. Hypermetabolic 3 cm cutaneous and subcutaneous soft tissue mass involving the posterior upper thorax suspicious for cutaneous manifestation of myeloma (plasmacytoma). 3. Persistent and slightly progressive hypermetabolic sternal lesion. 4. Diffuse lytic myelomatous lesions throughout the bony structures but no other hypermetabolic foci. 5. Focus of hypermetabolism in the left aspect of the lower prostate gland, not present on the prior study. This could be an area of infection/inflammation or potential prostate cancer. Recommend correlation with physical examination and PSA level.    Completed RT between 06/04/18 and 06/15/18 of 25 Gy by 10 fractions  The pt pursued a clinical trial at the [facility] with RT and immunotherapy in October 2019 through October 18, 2018 with further information available at [email or web address]  10/18/18 M Protein increased to 3.5g, from 0.7g at our last visit on 07/10/18. 10/02/18 PET/CT from [facility] indicated new hypermetabolism in left clavicle, left scapula, bilateral ribs, spine, and left proximal femur 10/18/18 MRI from [facility] ndicated a pathologic fracture at T12  10/2018 M Protein at 4.1g  11/23/18 NM Bone revealed Patchy uptake throughout the ribs and spine with areas of focally increased activity in both T12 pedicles and the right L1 pedicle. This focal activity could be stress related without clear corresponding fracture on available prior studies.  2) h/o Elevated bilirubin level . ? revlmid vs Gilberts  3) PET/CT abnormality in the Prostate. PSA levels WNL -Primary care physician to consider urology referral  4) Abnormal focus  of FDG avid at a spot in the liver - will need to be monitor on f/u scans. No pain or other symptoms at this time.  PLAN:  -Discussed pt labwork today, 04/03/19; blood counts and chemistries are stable -Discussed the last available 03/27/19 MMP which revealed M Protein stable at 0.4g, consistent with very good partial response as pt was at 4.1g in January 2002. -The pt has no prohibitive toxicities from continuing C6D8 Daratumumab, Dexamethasone, and 35m/m2 Carfilzomib at this time. -Daratumumab on D1 and D15 every 28 days -Continuing treatment until response reaches a plateau -Discussed again possibility of maintenance Daratumumab or Carfilzomib vs autologous stem cell transplant as potentially available courses of action. Pt prefers to consider the possibility of transplant after the coronavirus pandemic tapers further. -Recommend pt use his best judgement when it comes to driving. He does not have a condition which would prohibit him from driving. -Advised continued crowd avoidance, infection prevention strategies, and frequent hand washing -Advised that pt continue to follow with PT, but not over-exert on his own at home. Recommend back brace otc, or follow up with Dr. MPercell Miller-Recommend milk/dairy consumption for calcium and Vitamin D -Baseline laxative use of 2 pills Senna S at night, and one Miralax. Back off if diarrhea develops. -100-150cc Magnesium citrate if no bowel movement is achieved after 2-3 days -Continue Zometa every 4 weeks -Consume at least 60oz of water each day -Continue Acyclovir -Continue salt and baking soda mouthwashes -Recommended that the pt continue to eat well, drink at least 48-64  oz of water each day, and walk 20-30 minutes each day. -Will see the pt back on C7D8   Plz schedule C7 and C8 of treatment Labs on D1,8 and 15 each cycle RTC with Dr Irene Limbo C7D8 Continue Zometa [id number]-plz schedule next 4 doses   All of the patients questions were answered with  apparent satisfaction. The patient knows to call the clinic with any problems, questions or concerns.  The total time spent in the appt was 25 minutes and more than 50% was on counseling and direct patient cares.    Sullivan Lone MD [place] AAHIVMS [facility] Hematology/Oncology Physician [facility]  (Office):       [phone] (Work cell):  [phone] (Fax):           [phone]  I, Baldwin Jamaica, am acting as a scribe for Dr. Sullivan Lone.   .I have reviewed the above documentation for accuracy and completeness, and I agree with the above. [name] MD

## 2019-04-03 ENCOUNTER — Inpatient Hospital Stay: Payer: Medicare Other

## 2019-04-03 ENCOUNTER — Inpatient Hospital Stay (HOSPITAL_BASED_OUTPATIENT_CLINIC_OR_DEPARTMENT_OTHER): Payer: Medicare Other | Admitting: Hematology

## 2019-04-03 ENCOUNTER — Other Ambulatory Visit: Payer: Self-pay

## 2019-04-03 ENCOUNTER — Other Ambulatory Visit: Payer: Medicare Other

## 2019-04-03 VITALS — BP 147/84 | HR 69 | Temp 98.7°F | Resp 18 | Ht 73.0 in | Wt 219.6 lb

## 2019-04-03 DIAGNOSIS — C9002 Multiple myeloma in relapse: Secondary | ICD-10-CM

## 2019-04-03 DIAGNOSIS — R932 Abnormal findings on diagnostic imaging of liver and biliary tract: Secondary | ICD-10-CM

## 2019-04-03 DIAGNOSIS — R9389 Abnormal findings on diagnostic imaging of other specified body structures: Secondary | ICD-10-CM | POA: Diagnosis not present

## 2019-04-03 DIAGNOSIS — C9000 Multiple myeloma not having achieved remission: Secondary | ICD-10-CM

## 2019-04-03 DIAGNOSIS — Z7189 Other specified counseling: Secondary | ICD-10-CM

## 2019-04-03 DIAGNOSIS — Z5112 Encounter for antineoplastic immunotherapy: Secondary | ICD-10-CM

## 2019-04-03 DIAGNOSIS — G893 Neoplasm related pain (acute) (chronic): Secondary | ICD-10-CM

## 2019-04-03 LAB — CMP (CANCER CENTER ONLY)
ALT: 13 U/L (ref 0–44)
AST: 11 U/L — ABNORMAL LOW (ref 15–41)
Albumin: 3.7 g/dL (ref 3.5–5.0)
Alkaline Phosphatase: 68 U/L (ref 38–126)
Anion gap: 9 (ref 5–15)
BUN: 11 mg/dL (ref 8–23)
CO2: 24 mmol/L (ref 22–32)
Calcium: 8.6 mg/dL — ABNORMAL LOW (ref 8.9–10.3)
Chloride: 110 mmol/L (ref 98–111)
Creatinine: 0.71 mg/dL (ref 0.61–1.24)
GFR, Est AFR Am: >60 mL/min (ref >60)
GFR, Estimated: >60 mL/min (ref >60)
Glucose, Bld: 100 mg/dL — ABNORMAL HIGH (ref 70–99)
Potassium: 3.7 mmol/L (ref 3.5–5.1)
Sodium: 143 mmol/L (ref 135–145)
Total Bilirubin: 1 mg/dL (ref 0.3–1.2)
Total Protein: 6.1 g/dL — ABNORMAL LOW (ref 6.5–8.1)

## 2019-04-03 LAB — CBC WITH DIFFERENTIAL/PLATELET
Abs Immature Granulocytes: 0.02 10*3/uL (ref 0.00–0.07)
Basophils Absolute: 0 10*3/uL (ref 0.0–0.1)
Basophils Relative: 0 %
Eosinophils Absolute: 0.1 10*3/uL (ref 0.0–0.5)
Eosinophils Relative: 1 %
HCT: 34.1 % — ABNORMAL LOW (ref 39.0–52.0)
Hemoglobin: 11.3 g/dL — ABNORMAL LOW (ref 13.0–17.0)
Immature Granulocytes: 1 %
Lymphocytes Relative: 12 %
Lymphs Abs: 0.5 10*3/uL — ABNORMAL LOW (ref 0.7–4.0)
MCH: 32.5 pg (ref 26.0–34.0)
MCHC: 33.1 g/dL (ref 30.0–36.0)
MCV: 98 fL (ref 80.0–100.0)
Monocytes Absolute: 0.4 10*3/uL (ref 0.1–1.0)
Monocytes Relative: 11 %
Neutro Abs: 3 10*3/uL (ref 1.7–7.7)
Neutrophils Relative %: 75 %
Platelets: 125 10*3/uL — ABNORMAL LOW (ref 150–400)
RBC: 3.48 MIL/uL — ABNORMAL LOW (ref 4.22–5.81)
RDW: 14.1 % (ref 11.5–15.5)
WBC: 3.9 10*3/uL — ABNORMAL LOW (ref 4.0–10.5)
nRBC: 0 % (ref 0.0–0.2)

## 2019-04-03 MED ORDER — PROCHLORPERAZINE MALEATE 10 MG PO TABS
ORAL_TABLET | ORAL | Status: AC
  Filled 2019-04-03: qty 1

## 2019-04-03 MED ORDER — SODIUM CHLORIDE 0.9 % IV SOLN
Freq: Once | INTRAVENOUS | Status: AC
  Administered 2019-04-03: 12:00:00 via INTRAVENOUS
  Filled 2019-04-03: qty 250

## 2019-04-03 MED ORDER — DEXTROSE 5 % IV SOLN
55.0000 mg/m2 | Freq: Once | INTRAVENOUS | Status: AC
  Administered 2019-04-03: 130 mg via INTRAVENOUS
  Filled 2019-04-03: qty 5

## 2019-04-03 MED ORDER — PROCHLORPERAZINE MALEATE 10 MG PO TABS
10.0000 mg | ORAL_TABLET | Freq: Once | ORAL | Status: AC
  Administered 2019-04-03: 10 mg via ORAL

## 2019-04-03 MED ORDER — SODIUM CHLORIDE 0.9 % IV SOLN
Freq: Once | INTRAVENOUS | Status: AC
  Filled 2019-04-03: qty 250

## 2019-04-03 NOTE — Patient Instructions (Signed)
[facility] Discharge Instructions for Patients Receiving Chemotherapy  Today you received the following chemotherapy agents: Carfilzomib (Kyprolis)  To help prevent nausea and vomiting after your treatment, we encourage you to take your nausea medication as directed.    If you develop nausea and vomiting that is not controlled by your nausea medication, call the clinic.   BELOW ARE SYMPTOMS THAT SHOULD BE REPORTED IMMEDIATELY:  *FEVER GREATER THAN 100.5 F  *CHILLS WITH OR WITHOUT FEVER  NAUSEA AND VOMITING THAT IS NOT CONTROLLED WITH YOUR NAUSEA MEDICATION  *UNUSUAL SHORTNESS OF BREATH  *UNUSUAL BRUISING OR BLEEDING  TENDERNESS IN MOUTH AND THROAT WITH OR WITHOUT PRESENCE OF ULCERS  *URINARY PROBLEMS  *BOWEL PROBLEMS  UNUSUAL RASH Items with * indicate a potential emergency and should be followed up as soon as possible.  Feel free to call the clinic should you have any questions or concerns. The clinic phone number is [phone].  Please show the CHEMO ALERT CARD at check-in to the Emergency Department and triage nurse.   

## 2019-04-04 ENCOUNTER — Telehealth: Payer: Self-pay | Admitting: Hematology

## 2019-04-04 NOTE — Telephone Encounter (Signed)
Scheduled appt based off of 6/17 los.   Cycle 7 and 8 only had treatments for Day 1 based off of treatment plan.

## 2019-04-09 ENCOUNTER — Inpatient Hospital Stay: Payer: Medicare Other

## 2019-04-09 ENCOUNTER — Other Ambulatory Visit: Payer: Self-pay

## 2019-04-09 ENCOUNTER — Inpatient Hospital Stay: Payer: Medicare Other | Admitting: Hematology

## 2019-04-09 DIAGNOSIS — R9389 Abnormal findings on diagnostic imaging of other specified body structures: Secondary | ICD-10-CM | POA: Diagnosis not present

## 2019-04-09 DIAGNOSIS — Z7189 Other specified counseling: Secondary | ICD-10-CM

## 2019-04-09 DIAGNOSIS — R932 Abnormal findings on diagnostic imaging of liver and biliary tract: Secondary | ICD-10-CM | POA: Diagnosis not present

## 2019-04-09 DIAGNOSIS — Z5112 Encounter for antineoplastic immunotherapy: Secondary | ICD-10-CM | POA: Diagnosis not present

## 2019-04-09 DIAGNOSIS — C9000 Multiple myeloma not having achieved remission: Secondary | ICD-10-CM

## 2019-04-09 DIAGNOSIS — C9002 Multiple myeloma in relapse: Secondary | ICD-10-CM | POA: Diagnosis not present

## 2019-04-09 LAB — CBC WITH DIFFERENTIAL/PLATELET
Abs Immature Granulocytes: 0.02 10*3/uL (ref 0.00–0.07)
Basophils Absolute: 0 10*3/uL (ref 0.0–0.1)
Basophils Relative: 0 %
Eosinophils Absolute: 0.1 10*3/uL (ref 0.0–0.5)
Eosinophils Relative: 1 %
HCT: 31.8 % — ABNORMAL LOW (ref 39.0–52.0)
Hemoglobin: 10.5 g/dL — ABNORMAL LOW (ref 13.0–17.0)
Immature Granulocytes: 1 %
Lymphocytes Relative: 11 %
Lymphs Abs: 0.4 10*3/uL — ABNORMAL LOW (ref 0.7–4.0)
MCH: 32.6 pg (ref 26.0–34.0)
MCHC: 33 g/dL (ref 30.0–36.0)
MCV: 98.8 fL (ref 80.0–100.0)
Monocytes Absolute: 0.3 10*3/uL (ref 0.1–1.0)
Monocytes Relative: 8 %
Neutro Abs: 2.8 10*3/uL (ref 1.7–7.7)
Neutrophils Relative %: 79 %
Platelets: 137 10*3/uL — ABNORMAL LOW (ref 150–400)
RBC: 3.22 MIL/uL — ABNORMAL LOW (ref 4.22–5.81)
RDW: 13.8 % (ref 11.5–15.5)
WBC: 3.6 10*3/uL — ABNORMAL LOW (ref 4.0–10.5)
nRBC: 0 % (ref 0.0–0.2)

## 2019-04-09 LAB — CMP (CANCER CENTER ONLY)
ALT: 25 U/L (ref 0–44)
AST: 12 U/L — ABNORMAL LOW (ref 15–41)
Albumin: 3.6 g/dL (ref 3.5–5.0)
Alkaline Phosphatase: 58 U/L (ref 38–126)
Anion gap: 8 (ref 5–15)
BUN: 10 mg/dL (ref 8–23)
CO2: 26 mmol/L (ref 22–32)
Calcium: 8.7 mg/dL — ABNORMAL LOW (ref 8.9–10.3)
Chloride: 107 mmol/L (ref 98–111)
Creatinine: 0.79 mg/dL (ref 0.61–1.24)
GFR, Est AFR Am: >60 mL/min (ref >60)
GFR, Estimated: >60 mL/min (ref >60)
Glucose, Bld: 101 mg/dL — ABNORMAL HIGH (ref 70–99)
Potassium: 3.6 mmol/L (ref 3.5–5.1)
Sodium: 141 mmol/L (ref 135–145)
Total Bilirubin: 1 mg/dL (ref 0.3–1.2)
Total Protein: 6.1 g/dL — ABNORMAL LOW (ref 6.5–8.1)

## 2019-04-10 ENCOUNTER — Other Ambulatory Visit: Payer: Self-pay

## 2019-04-10 ENCOUNTER — Telehealth: Payer: Self-pay | Admitting: Hematology

## 2019-04-10 ENCOUNTER — Inpatient Hospital Stay: Payer: Medicare Other

## 2019-04-10 VITALS — BP 159/84 | HR 58 | Temp 98.5°F | Resp 18

## 2019-04-10 DIAGNOSIS — Z7189 Other specified counseling: Secondary | ICD-10-CM

## 2019-04-10 DIAGNOSIS — R9389 Abnormal findings on diagnostic imaging of other specified body structures: Secondary | ICD-10-CM | POA: Diagnosis not present

## 2019-04-10 DIAGNOSIS — C9000 Multiple myeloma not having achieved remission: Secondary | ICD-10-CM

## 2019-04-10 DIAGNOSIS — C9002 Multiple myeloma in relapse: Secondary | ICD-10-CM | POA: Diagnosis not present

## 2019-04-10 DIAGNOSIS — Z5112 Encounter for antineoplastic immunotherapy: Secondary | ICD-10-CM | POA: Diagnosis not present

## 2019-04-10 DIAGNOSIS — R932 Abnormal findings on diagnostic imaging of liver and biliary tract: Secondary | ICD-10-CM | POA: Diagnosis not present

## 2019-04-10 MED ORDER — DEXTROSE 5 % IV SOLN
55.0000 mg/m2 | Freq: Once | INTRAVENOUS | Status: DC
  Filled 2019-04-10: qty 65

## 2019-04-10 MED ORDER — ACETAMINOPHEN 325 MG PO TABS
650.0000 mg | ORAL_TABLET | Freq: Once | ORAL | Status: AC
  Administered 2019-04-10: 650 mg via ORAL

## 2019-04-10 MED ORDER — SODIUM CHLORIDE 0.9 % IV SOLN
15.6000 mg/kg | Freq: Once | INTRAVENOUS | Status: AC
  Administered 2019-04-10: 1700 mg via INTRAVENOUS
  Filled 2019-04-10: qty 80

## 2019-04-10 MED ORDER — FAMOTIDINE IN NACL 20-0.9 MG/50ML-% IV SOLN
20.0000 mg | Freq: Once | INTRAVENOUS | Status: AC
  Administered 2019-04-10: 20 mg via INTRAVENOUS

## 2019-04-10 MED ORDER — DEXTROSE 5 % IV SOLN
55.0000 mg/m2 | Freq: Once | INTRAVENOUS | Status: AC
  Administered 2019-04-10: 130 mg via INTRAVENOUS
  Filled 2019-04-10: qty 60

## 2019-04-10 MED ORDER — DIPHENHYDRAMINE HCL 25 MG PO CAPS
ORAL_CAPSULE | ORAL | Status: AC
  Filled 2019-04-10: qty 2

## 2019-04-10 MED ORDER — ACETAMINOPHEN 325 MG PO TABS
ORAL_TABLET | ORAL | Status: AC
  Filled 2019-04-10: qty 2

## 2019-04-10 MED ORDER — MONTELUKAST SODIUM 10 MG PO TABS
ORAL_TABLET | ORAL | Status: AC
  Filled 2019-04-10: qty 1

## 2019-04-10 MED ORDER — FAMOTIDINE IN NACL 20-0.9 MG/50ML-% IV SOLN
INTRAVENOUS | Status: AC
  Filled 2019-04-10: qty 50

## 2019-04-10 MED ORDER — DIPHENHYDRAMINE HCL 25 MG PO CAPS
50.0000 mg | ORAL_CAPSULE | Freq: Once | ORAL | Status: AC
  Administered 2019-04-10: 50 mg via ORAL

## 2019-04-10 MED ORDER — MONTELUKAST SODIUM 10 MG PO TABS
10.0000 mg | ORAL_TABLET | Freq: Once | ORAL | Status: AC
  Administered 2019-04-10: 10 mg via ORAL

## 2019-04-10 MED ORDER — METHYLPREDNISOLONE SODIUM SUCC 125 MG IJ SOLR
INTRAMUSCULAR | Status: AC
  Filled 2019-04-10: qty 2

## 2019-04-10 MED ORDER — METHYLPREDNISOLONE SODIUM SUCC 125 MG IJ SOLR
100.0000 mg | Freq: Once | INTRAMUSCULAR | Status: AC
  Administered 2019-04-10: 100 mg via INTRAVENOUS

## 2019-04-10 MED ORDER — PROCHLORPERAZINE MALEATE 10 MG PO TABS
ORAL_TABLET | ORAL | Status: AC
  Filled 2019-04-10: qty 1

## 2019-04-10 MED ORDER — SODIUM CHLORIDE 0.9 % IV SOLN
Freq: Once | INTRAVENOUS | Status: AC
  Administered 2019-04-10: 09:00:00 via INTRAVENOUS
  Filled 2019-04-10: qty 250

## 2019-04-10 MED ORDER — PROCHLORPERAZINE MALEATE 10 MG PO TABS
10.0000 mg | ORAL_TABLET | Freq: Once | ORAL | Status: AC
  Administered 2019-04-10: 10 mg via ORAL

## 2019-04-10 NOTE — Telephone Encounter (Signed)
No los per 6/23.

## 2019-04-10 NOTE — Patient Instructions (Signed)
[facility] Discharge Instructions for Patients Receiving Chemotherapy  Today you received the following chemotherapy agents:  carfilzomib (Kyprolis), daratumumab (Darzalex)  To help prevent nausea and vomiting after your treatment, we encourage you to take your nausea medication as prescribed.   If you develop nausea and vomiting that is not controlled by your nausea medication, call the clinic.   BELOW ARE SYMPTOMS THAT SHOULD BE REPORTED IMMEDIATELY:  *FEVER GREATER THAN 100.5 F  *CHILLS WITH OR WITHOUT FEVER  NAUSEA AND VOMITING THAT IS NOT CONTROLLED WITH YOUR NAUSEA MEDICATION  *UNUSUAL SHORTNESS OF BREATH  *UNUSUAL BRUISING OR BLEEDING  TENDERNESS IN MOUTH AND THROAT WITH OR WITHOUT PRESENCE OF ULCERS  *URINARY PROBLEMS  *BOWEL PROBLEMS  UNUSUAL RASH Items with * indicate a potential emergency and should be followed up as soon as possible.  Feel free to call the clinic should you have any questions or concerns. The clinic phone number is [phone].  Please show the [facility] at check-in to the Emergency Department and triage nurse.  Coronavirus (COVID-19) Are you at risk?  Are you at risk for the Coronavirus (COVID-19)?  To be considered HIGH RISK for Coronavirus (COVID-19), you have to meet the following criteria:  . Traveled to [place], [place], [place], [place] or [place]; or in the [place] to [place], [place], [place], or [place]; and have fever, cough, and shortness of breath within the last 2 weeks of travel OR . Been in close contact with a person diagnosed with COVID-19 within the last 2 weeks and have fever, cough, and shortness of breath . IF YOU DO NOT MEET THESE CRITERIA, YOU ARE CONSIDERED LOW RISK FOR COVID-19.  What to do if you are HIGH RISK for COVID-19?  [name] If you are having a medical emergency, call 911. . Seek medical care right away. Before you go to a doctor's office, urgent care or emergency department,  call ahead and tell them about your recent travel, contact with someone diagnosed with COVID-19, and your symptoms. You should receive instructions from your physician's office regarding next steps of care.  . When you arrive at healthcare provider, tell the healthcare staff immediately you have returned from visiting [place], [place], [place], [place] or [place]; or traveled in the [place] to [place], [place], [place], or [place]; in the last two weeks or you have been in close contact with a person diagnosed with COVID-19 in the last 2 weeks.   . Tell the health care staff about your symptoms: fever, cough and shortness of breath. . After you have been seen by a medical provider, you will be either: o Tested for (COVID-19) and discharged home on quarantine except to seek medical care if symptoms worsen, and asked to  - Stay home and avoid contact with others until you get your results (4-5 days)  - Avoid travel on public transportation if possible (such as bus, train, or airplane) or o Sent to the Emergency Department by EMS for evaluation, COVID-19 testing, and possible admission depending on your condition and test results.  What to do if you are LOW RISK for COVID-19?  Reduce your risk of any infection by using the same precautions used for avoiding the common cold or flu:  [name] Wash your hands often with soap and warm water for at least 20 seconds.  If soap and water are not readily available, use an alcohol-based hand sanitizer with at least 60% alcohol.  [name]  If coughing or sneezing, cover your mouth and nose by coughing or sneezing into the elbow areas of your shirt or coat, into a tissue or into your sleeve (not your hands). . Avoid shaking hands with others and consider head nods or verbal greetings only. . Avoid touching your eyes, nose, or mouth with unwashed hands.  . Avoid close contact with people who are sick. . Avoid places or events with large numbers of people in one  location, like concerts or sporting events. . Carefully consider travel plans you have or are making. . If you are planning any travel outside or inside the [place], visit the CDC's Travelers' Health webpage for the latest health notices. . If you have some symptoms but not all symptoms, continue to monitor at home and seek medical attention if your symptoms worsen. . If you are having a medical emergency, call 911.   [place] / e-Visit: [email or web address]         [facility]: [facility]: [phone]                   [facility]: [phone]

## 2019-04-15 NOTE — Progress Notes (Signed)
This encounter was created in error - please disregard.
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
## 2019-04-23 ENCOUNTER — Inpatient Hospital Stay (HOSPITAL_BASED_OUTPATIENT_CLINIC_OR_DEPARTMENT_OTHER): Payer: Medicare Other | Admitting: Hematology

## 2019-04-23 ENCOUNTER — Other Ambulatory Visit: Payer: Self-pay

## 2019-04-23 ENCOUNTER — Inpatient Hospital Stay: Payer: Medicare Other | Attending: Hematology

## 2019-04-23 ENCOUNTER — Telehealth: Payer: Self-pay | Admitting: Hematology

## 2019-04-23 VITALS — BP 149/85 | HR 72 | Temp 98.2°F | Resp 18 | Ht 73.0 in | Wt 217.5 lb

## 2019-04-23 DIAGNOSIS — E119 Type 2 diabetes mellitus without complications: Secondary | ICD-10-CM

## 2019-04-23 DIAGNOSIS — Z5112 Encounter for antineoplastic immunotherapy: Secondary | ICD-10-CM | POA: Insufficient documentation

## 2019-04-23 DIAGNOSIS — Z7189 Other specified counseling: Secondary | ICD-10-CM

## 2019-04-23 DIAGNOSIS — E785 Hyperlipidemia, unspecified: Secondary | ICD-10-CM | POA: Insufficient documentation

## 2019-04-23 DIAGNOSIS — C9002 Multiple myeloma in relapse: Secondary | ICD-10-CM | POA: Diagnosis not present

## 2019-04-23 DIAGNOSIS — Z7982 Long term (current) use of aspirin: Secondary | ICD-10-CM | POA: Insufficient documentation

## 2019-04-23 DIAGNOSIS — Z79899 Other long term (current) drug therapy: Secondary | ICD-10-CM | POA: Insufficient documentation

## 2019-04-23 DIAGNOSIS — C9000 Multiple myeloma not having achieved remission: Secondary | ICD-10-CM

## 2019-04-23 DIAGNOSIS — G893 Neoplasm related pain (acute) (chronic): Secondary | ICD-10-CM

## 2019-04-23 LAB — CBC WITH DIFFERENTIAL/PLATELET
Abs Immature Granulocytes: 0.02 10*3/uL (ref 0.00–0.07)
Basophils Absolute: 0 10*3/uL (ref 0.0–0.1)
Basophils Relative: 1 %
Eosinophils Absolute: 0.1 10*3/uL (ref 0.0–0.5)
Eosinophils Relative: 1 %
HCT: 36.8 % — ABNORMAL LOW (ref 39.0–52.0)
Hemoglobin: 11.8 g/dL — ABNORMAL LOW (ref 13.0–17.0)
Immature Granulocytes: 1 %
Lymphocytes Relative: 11 %
Lymphs Abs: 0.4 10*3/uL — ABNORMAL LOW (ref 0.7–4.0)
MCH: 32.9 pg (ref 26.0–34.0)
MCHC: 32.1 g/dL (ref 30.0–36.0)
MCV: 102.5 fL — ABNORMAL HIGH (ref 80.0–100.0)
Monocytes Absolute: 0.3 10*3/uL (ref 0.1–1.0)
Monocytes Relative: 9 %
Neutro Abs: 2.7 10*3/uL (ref 1.7–7.7)
Neutrophils Relative %: 77 %
Platelets: 205 10*3/uL (ref 150–400)
RBC: 3.59 MIL/uL — ABNORMAL LOW (ref 4.22–5.81)
RDW: 14 % (ref 11.5–15.5)
WBC: 3.6 10*3/uL — ABNORMAL LOW (ref 4.0–10.5)
nRBC: 0 % (ref 0.0–0.2)

## 2019-04-23 LAB — CMP (CANCER CENTER ONLY)
ALT: 16 U/L (ref 0–44)
AST: 14 U/L — ABNORMAL LOW (ref 15–41)
Albumin: 3.9 g/dL (ref 3.5–5.0)
Alkaline Phosphatase: 83 U/L (ref 38–126)
Anion gap: 7 (ref 5–15)
BUN: 9 mg/dL (ref 8–23)
CO2: 28 mmol/L (ref 22–32)
Calcium: 8.8 mg/dL — ABNORMAL LOW (ref 8.9–10.3)
Chloride: 108 mmol/L (ref 98–111)
Creatinine: 0.77 mg/dL (ref 0.61–1.24)
GFR, Est AFR Am: >60 mL/min (ref >60)
GFR, Estimated: >60 mL/min (ref >60)
Glucose, Bld: 124 mg/dL — ABNORMAL HIGH (ref 70–99)
Potassium: 4.1 mmol/L (ref 3.5–5.1)
Sodium: 143 mmol/L (ref 135–145)
Total Bilirubin: 1.1 mg/dL (ref 0.3–1.2)
Total Protein: 6.3 g/dL — ABNORMAL LOW (ref 6.5–8.1)

## 2019-04-23 NOTE — Telephone Encounter (Signed)
Scheduled appt per 7/7 los.

## 2019-04-24 ENCOUNTER — Inpatient Hospital Stay: Payer: Medicare Other

## 2019-04-24 ENCOUNTER — Other Ambulatory Visit: Payer: Self-pay

## 2019-04-24 VITALS — BP 139/79 | HR 68 | Temp 98.2°F | Resp 18

## 2019-04-24 DIAGNOSIS — Z7189 Other specified counseling: Secondary | ICD-10-CM

## 2019-04-24 DIAGNOSIS — C9000 Multiple myeloma not having achieved remission: Secondary | ICD-10-CM

## 2019-04-24 DIAGNOSIS — Z79899 Other long term (current) drug therapy: Secondary | ICD-10-CM | POA: Diagnosis not present

## 2019-04-24 DIAGNOSIS — Z7982 Long term (current) use of aspirin: Secondary | ICD-10-CM | POA: Diagnosis not present

## 2019-04-24 DIAGNOSIS — Z5112 Encounter for antineoplastic immunotherapy: Secondary | ICD-10-CM | POA: Diagnosis not present

## 2019-04-24 DIAGNOSIS — E785 Hyperlipidemia, unspecified: Secondary | ICD-10-CM | POA: Diagnosis not present

## 2019-04-24 DIAGNOSIS — C9002 Multiple myeloma in relapse: Secondary | ICD-10-CM

## 2019-04-24 DIAGNOSIS — E119 Type 2 diabetes mellitus without complications: Secondary | ICD-10-CM | POA: Diagnosis not present

## 2019-04-24 MED ORDER — PROCHLORPERAZINE MALEATE 10 MG PO TABS
10.0000 mg | ORAL_TABLET | Freq: Once | ORAL | Status: AC
  Administered 2019-04-24: 09:00:00 10 mg via ORAL

## 2019-04-24 MED ORDER — FAMOTIDINE IN NACL 20-0.9 MG/50ML-% IV SOLN
20.0000 mg | Freq: Once | INTRAVENOUS | Status: AC
  Administered 2019-04-24: 20 mg via INTRAVENOUS

## 2019-04-24 MED ORDER — DEXTROSE 5 % IV SOLN
55.0000 mg/m2 | Freq: Once | INTRAVENOUS | Status: AC
  Administered 2019-04-24: 130 mg via INTRAVENOUS
  Filled 2019-04-24: qty 60

## 2019-04-24 MED ORDER — ZOLEDRONIC ACID 4 MG/100ML IV SOLN
4.0000 mg | Freq: Once | INTRAVENOUS | Status: AC
  Administered 2019-04-24: 10:00:00 4 mg via INTRAVENOUS
  Filled 2019-04-24: qty 100

## 2019-04-24 MED ORDER — FAMOTIDINE IN NACL 20-0.9 MG/50ML-% IV SOLN
INTRAVENOUS | Status: AC
  Filled 2019-04-24: qty 50

## 2019-04-24 MED ORDER — ACETAMINOPHEN 325 MG PO TABS
650.0000 mg | ORAL_TABLET | Freq: Once | ORAL | Status: AC
  Administered 2019-04-24: 09:00:00 650 mg via ORAL

## 2019-04-24 MED ORDER — ACETAMINOPHEN 325 MG PO TABS
ORAL_TABLET | ORAL | Status: AC
  Filled 2019-04-24: qty 2

## 2019-04-24 MED ORDER — DIPHENHYDRAMINE HCL 25 MG PO CAPS
ORAL_CAPSULE | ORAL | Status: AC
  Filled 2019-04-24: qty 2

## 2019-04-24 MED ORDER — METHYLPREDNISOLONE SODIUM SUCC 125 MG IJ SOLR
100.0000 mg | Freq: Once | INTRAMUSCULAR | Status: AC
  Administered 2019-04-24: 100 mg via INTRAVENOUS

## 2019-04-24 MED ORDER — MONTELUKAST SODIUM 10 MG PO TABS
10.0000 mg | ORAL_TABLET | Freq: Once | ORAL | Status: AC
  Administered 2019-04-24: 10 mg via ORAL

## 2019-04-24 MED ORDER — PROCHLORPERAZINE MALEATE 10 MG PO TABS
ORAL_TABLET | ORAL | Status: AC
  Filled 2019-04-24: qty 1

## 2019-04-24 MED ORDER — MONTELUKAST SODIUM 10 MG PO TABS
ORAL_TABLET | ORAL | Status: AC
  Filled 2019-04-24: qty 1

## 2019-04-24 MED ORDER — SODIUM CHLORIDE 0.9 % IV SOLN
15.6000 mg/kg | Freq: Once | INTRAVENOUS | Status: AC
  Administered 2019-04-24: 1700 mg via INTRAVENOUS
  Filled 2019-04-24: qty 80

## 2019-04-24 MED ORDER — DIPHENHYDRAMINE HCL 25 MG PO CAPS
50.0000 mg | ORAL_CAPSULE | Freq: Once | ORAL | Status: AC
  Administered 2019-04-24: 50 mg via ORAL

## 2019-04-24 MED ORDER — METHYLPREDNISOLONE SODIUM SUCC 125 MG IJ SOLR
INTRAMUSCULAR | Status: AC
  Filled 2019-04-24: qty 2

## 2019-04-24 MED ORDER — SODIUM CHLORIDE 0.9 % IV SOLN
Freq: Once | INTRAVENOUS | Status: AC
  Administered 2019-04-24: 09:00:00 via INTRAVENOUS
  Filled 2019-04-24: qty 250

## 2019-04-24 NOTE — Patient Instructions (Signed)
[facility] Discharge Instructions for Patients Receiving Chemotherapy  Today you received the following chemotherapy agents:  carfilzomib (Kyprolis), daratumumab (Darzalex), Zometa  To help prevent nausea and vomiting after your treatment, we encourage you to take your nausea medication as prescribed.   If you develop nausea and vomiting that is not controlled by your nausea medication, call the clinic.   BELOW ARE SYMPTOMS THAT SHOULD BE REPORTED IMMEDIATELY:  *FEVER GREATER THAN 100.5 F  *CHILLS WITH OR WITHOUT FEVER  NAUSEA AND VOMITING THAT IS NOT CONTROLLED WITH YOUR NAUSEA MEDICATION  *UNUSUAL SHORTNESS OF BREATH  *UNUSUAL BRUISING OR BLEEDING  TENDERNESS IN MOUTH AND THROAT WITH OR WITHOUT PRESENCE OF ULCERS  *URINARY PROBLEMS  *BOWEL PROBLEMS  UNUSUAL RASH Items with * indicate a potential emergency and should be followed up as soon as possible.  Feel free to call the clinic should you have any questions or concerns. The clinic phone number is [phone].  Please show the [facility] at check-in to the Emergency Department and triage nurse.  Coronavirus (COVID-19) Are you at risk?  Are you at risk for the Coronavirus (COVID-19)?  To be considered HIGH RISK for Coronavirus (COVID-19), you have to meet the following criteria:  . Traveled to [place], [place], [place], [place] or [place]; or in the [place] to [place], [place], [place], or [place]; and have fever, cough, and shortness of breath within the last 2 weeks of travel OR . Been in close contact with a person diagnosed with COVID-19 within the last 2 weeks and have fever, cough, and shortness of breath . IF YOU DO NOT MEET THESE CRITERIA, YOU ARE CONSIDERED LOW RISK FOR COVID-19.  What to do if you are HIGH RISK for COVID-19?  [name] If you are having a medical emergency, call 911. . Seek medical care right away. Before you go to a doctor's office, urgent care or emergency  department, call ahead and tell them about your recent travel, contact with someone diagnosed with COVID-19, and your symptoms. You should receive instructions from your physician's office regarding next steps of care.  . When you arrive at healthcare provider, tell the healthcare staff immediately you have returned from visiting [place], [place], [place], [place] or [place]; or traveled in the [place] to [place], [place], [place], or [place]; in the last two weeks or you have been in close contact with a person diagnosed with COVID-19 in the last 2 weeks.   . Tell the health care staff about your symptoms: fever, cough and shortness of breath. . After you have been seen by a medical provider, you will be either: o Tested for (COVID-19) and discharged home on quarantine except to seek medical care if symptoms worsen, and asked to  - Stay home and avoid contact with others until you get your results (4-5 days)  - Avoid travel on public transportation if possible (such as bus, train, or airplane) or o Sent to the Emergency Department by EMS for evaluation, COVID-19 testing, and possible admission depending on your condition and test results.  What to do if you are LOW RISK for COVID-19?  Reduce your risk of any infection by using the same precautions used for avoiding the common cold or flu:  [name] Wash your hands often with soap and warm water for at least 20 seconds.  If soap and water are not readily available, use an alcohol-based hand sanitizer with at least 60% alcohol.  [name]  If coughing or sneezing, cover your mouth and nose by coughing or sneezing into the elbow areas of your shirt or coat, into a tissue or into your sleeve (not your hands). . Avoid shaking hands with others and consider head nods or verbal greetings only. . Avoid touching your eyes, nose, or mouth with unwashed hands.  . Avoid close contact with people who are sick. . Avoid places or events with large numbers of people  in one location, like concerts or sporting events. . Carefully consider travel plans you have or are making. . If you are planning any travel outside or inside the [place], visit the CDC's Travelers' Health webpage for the latest health notices. . If you have some symptoms but not all symptoms, continue to monitor at home and seek medical attention if your symptoms worsen. . If you are having a medical emergency, call 911.   [place] / e-Visit: [email or web address]         [facility]: [facility]: [phone]                   [facility]: [phone]

## 2019-05-01 ENCOUNTER — Ambulatory Visit: Payer: Medicare Other | Admitting: Hematology

## 2019-05-01 ENCOUNTER — Other Ambulatory Visit: Payer: Self-pay

## 2019-05-01 ENCOUNTER — Inpatient Hospital Stay: Payer: Medicare Other

## 2019-05-01 ENCOUNTER — Other Ambulatory Visit: Payer: Medicare Other

## 2019-05-01 VITALS — BP 148/75 | HR 65 | Temp 98.7°F | Resp 17

## 2019-05-01 DIAGNOSIS — Z5112 Encounter for antineoplastic immunotherapy: Secondary | ICD-10-CM

## 2019-05-01 DIAGNOSIS — C9002 Multiple myeloma in relapse: Secondary | ICD-10-CM | POA: Diagnosis not present

## 2019-05-01 DIAGNOSIS — E785 Hyperlipidemia, unspecified: Secondary | ICD-10-CM | POA: Diagnosis not present

## 2019-05-01 DIAGNOSIS — Z7982 Long term (current) use of aspirin: Secondary | ICD-10-CM | POA: Diagnosis not present

## 2019-05-01 DIAGNOSIS — G893 Neoplasm related pain (acute) (chronic): Secondary | ICD-10-CM

## 2019-05-01 DIAGNOSIS — C9000 Multiple myeloma not having achieved remission: Secondary | ICD-10-CM

## 2019-05-01 DIAGNOSIS — Z79899 Other long term (current) drug therapy: Secondary | ICD-10-CM | POA: Diagnosis not present

## 2019-05-01 DIAGNOSIS — E119 Type 2 diabetes mellitus without complications: Secondary | ICD-10-CM | POA: Diagnosis not present

## 2019-05-01 DIAGNOSIS — Z7189 Other specified counseling: Secondary | ICD-10-CM

## 2019-05-01 LAB — CMP (CANCER CENTER ONLY)
ALT: 12 U/L (ref 0–44)
AST: 12 U/L — ABNORMAL LOW (ref 15–41)
Albumin: 3.8 g/dL (ref 3.5–5.0)
Alkaline Phosphatase: 73 U/L (ref 38–126)
Anion gap: 8 (ref 5–15)
BUN: 21 mg/dL (ref 8–23)
CO2: 23 mmol/L (ref 22–32)
Calcium: 8.6 mg/dL — ABNORMAL LOW (ref 8.9–10.3)
Chloride: 109 mmol/L (ref 98–111)
Creatinine: 0.77 mg/dL (ref 0.61–1.24)
GFR, Est AFR Am: >60 mL/min (ref >60)
GFR, Estimated: >60 mL/min (ref >60)
Glucose, Bld: 108 mg/dL — ABNORMAL HIGH (ref 70–99)
Potassium: 3.3 mmol/L — ABNORMAL LOW (ref 3.5–5.1)
Sodium: 140 mmol/L (ref 135–145)
Total Bilirubin: 1.6 mg/dL — ABNORMAL HIGH (ref 0.3–1.2)
Total Protein: 6.2 g/dL — ABNORMAL LOW (ref 6.5–8.1)

## 2019-05-01 LAB — CBC WITH DIFFERENTIAL/PLATELET
Abs Immature Granulocytes: 0.02 10*3/uL (ref 0.00–0.07)
Basophils Absolute: 0 10*3/uL (ref 0.0–0.1)
Basophils Relative: 0 %
Eosinophils Absolute: 0.1 10*3/uL (ref 0.0–0.5)
Eosinophils Relative: 1 %
HCT: 32.3 % — ABNORMAL LOW (ref 39.0–52.0)
Hemoglobin: 10.9 g/dL — ABNORMAL LOW (ref 13.0–17.0)
Immature Granulocytes: 0 %
Lymphocytes Relative: 11 %
Lymphs Abs: 0.6 10*3/uL — ABNORMAL LOW (ref 0.7–4.0)
MCH: 33.5 pg (ref 26.0–34.0)
MCHC: 33.7 g/dL (ref 30.0–36.0)
MCV: 99.4 fL (ref 80.0–100.0)
Monocytes Absolute: 0.5 10*3/uL (ref 0.1–1.0)
Monocytes Relative: 9 %
Neutro Abs: 4.2 10*3/uL (ref 1.7–7.7)
Neutrophils Relative %: 79 %
Platelets: 128 10*3/uL — ABNORMAL LOW (ref 150–400)
RBC: 3.25 MIL/uL — ABNORMAL LOW (ref 4.22–5.81)
RDW: 13.7 % (ref 11.5–15.5)
WBC: 5.4 10*3/uL (ref 4.0–10.5)
nRBC: 0 % (ref 0.0–0.2)

## 2019-05-01 MED ORDER — PROCHLORPERAZINE MALEATE 10 MG PO TABS
10.0000 mg | ORAL_TABLET | Freq: Once | ORAL | Status: AC
  Administered 2019-05-01: 10 mg via ORAL

## 2019-05-01 MED ORDER — DEXTROSE 5 % IV SOLN
55.0000 mg/m2 | Freq: Once | INTRAVENOUS | Status: AC
  Administered 2019-05-01: 130 mg via INTRAVENOUS
  Filled 2019-05-01: qty 60

## 2019-05-01 MED ORDER — SODIUM CHLORIDE 0.9 % IV SOLN
Freq: Once | INTRAVENOUS | Status: AC
  Administered 2019-05-01: 15:00:00 via INTRAVENOUS
  Filled 2019-05-01: qty 250

## 2019-05-01 MED ORDER — SODIUM CHLORIDE 0.9 % IV SOLN
Freq: Once | INTRAVENOUS | Status: AC
  Administered 2019-05-01: 16:00:00 via INTRAVENOUS
  Filled 2019-05-01: qty 250

## 2019-05-01 MED ORDER — PROCHLORPERAZINE MALEATE 10 MG PO TABS
ORAL_TABLET | ORAL | Status: AC
  Filled 2019-05-01: qty 1

## 2019-05-01 NOTE — Patient Instructions (Signed)
[facility] Discharge Instructions for Patients Receiving Chemotherapy  Today you received the following chemotherapy agents:  Kyprolis  To help prevent nausea and vomiting after your treatment, we encourage you to take your nausea medication as directed.   If you develop nausea and vomiting that is not controlled by your nausea medication, call the clinic.   BELOW ARE SYMPTOMS THAT SHOULD BE REPORTED IMMEDIATELY:  *FEVER GREATER THAN 100.5 F  *CHILLS WITH OR WITHOUT FEVER  NAUSEA AND VOMITING THAT IS NOT CONTROLLED WITH YOUR NAUSEA MEDICATION  *UNUSUAL SHORTNESS OF BREATH  *UNUSUAL BRUISING OR BLEEDING  TENDERNESS IN MOUTH AND THROAT WITH OR WITHOUT PRESENCE OF ULCERS  *URINARY PROBLEMS  *BOWEL PROBLEMS  UNUSUAL RASH Items with * indicate a potential emergency and should be followed up as soon as possible.  Feel free to call the clinic should you have any questions or concerns. The clinic phone number is [phone].  Please show the CHEMO ALERT CARD at check-in to the Emergency Department and triage nurse.   

## 2019-05-08 ENCOUNTER — Inpatient Hospital Stay: Payer: Medicare Other

## 2019-05-08 ENCOUNTER — Other Ambulatory Visit: Payer: Self-pay

## 2019-05-08 ENCOUNTER — Other Ambulatory Visit: Payer: Medicare Other

## 2019-05-08 VITALS — BP 149/75 | HR 65 | Temp 98.0°F | Resp 16

## 2019-05-08 DIAGNOSIS — C9000 Multiple myeloma not having achieved remission: Secondary | ICD-10-CM

## 2019-05-08 DIAGNOSIS — Z5112 Encounter for antineoplastic immunotherapy: Secondary | ICD-10-CM

## 2019-05-08 DIAGNOSIS — Z7189 Other specified counseling: Secondary | ICD-10-CM

## 2019-05-08 DIAGNOSIS — C9002 Multiple myeloma in relapse: Secondary | ICD-10-CM | POA: Diagnosis not present

## 2019-05-08 DIAGNOSIS — E785 Hyperlipidemia, unspecified: Secondary | ICD-10-CM | POA: Diagnosis not present

## 2019-05-08 DIAGNOSIS — G893 Neoplasm related pain (acute) (chronic): Secondary | ICD-10-CM

## 2019-05-08 DIAGNOSIS — E119 Type 2 diabetes mellitus without complications: Secondary | ICD-10-CM | POA: Diagnosis not present

## 2019-05-08 DIAGNOSIS — Z7982 Long term (current) use of aspirin: Secondary | ICD-10-CM | POA: Diagnosis not present

## 2019-05-08 DIAGNOSIS — Z79899 Other long term (current) drug therapy: Secondary | ICD-10-CM | POA: Diagnosis not present

## 2019-05-08 LAB — CBC WITH DIFFERENTIAL/PLATELET
Abs Immature Granulocytes: 0.01 10*3/uL (ref 0.00–0.07)
Basophils Absolute: 0 10*3/uL (ref 0.0–0.1)
Basophils Relative: 1 %
Eosinophils Absolute: 0.1 10*3/uL (ref 0.0–0.5)
Eosinophils Relative: 2 %
HCT: 33.3 % — ABNORMAL LOW (ref 39.0–52.0)
Hemoglobin: 11.2 g/dL — ABNORMAL LOW (ref 13.0–17.0)
Immature Granulocytes: 0 %
Lymphocytes Relative: 12 %
Lymphs Abs: 0.5 10*3/uL — ABNORMAL LOW (ref 0.7–4.0)
MCH: 33.3 pg (ref 26.0–34.0)
MCHC: 33.6 g/dL (ref 30.0–36.0)
MCV: 99.1 fL (ref 80.0–100.0)
Monocytes Absolute: 0.4 10*3/uL (ref 0.1–1.0)
Monocytes Relative: 8 %
Neutro Abs: 3.3 10*3/uL (ref 1.7–7.7)
Neutrophils Relative %: 77 %
Platelets: 160 10*3/uL (ref 150–400)
RBC: 3.36 MIL/uL — ABNORMAL LOW (ref 4.22–5.81)
RDW: 13.4 % (ref 11.5–15.5)
WBC: 4.3 10*3/uL (ref 4.0–10.5)
nRBC: 0 % (ref 0.0–0.2)

## 2019-05-08 LAB — CMP (CANCER CENTER ONLY)
ALT: 20 U/L (ref 0–44)
AST: 16 U/L (ref 15–41)
Albumin: 3.9 g/dL (ref 3.5–5.0)
Alkaline Phosphatase: 63 U/L (ref 38–126)
Anion gap: 9 (ref 5–15)
BUN: 13 mg/dL (ref 8–23)
CO2: 23 mmol/L (ref 22–32)
Calcium: 9 mg/dL (ref 8.9–10.3)
Chloride: 111 mmol/L (ref 98–111)
Creatinine: 0.78 mg/dL (ref 0.61–1.24)
GFR, Est AFR Am: >60 mL/min (ref >60)
GFR, Estimated: >60 mL/min (ref >60)
Glucose, Bld: 106 mg/dL — ABNORMAL HIGH (ref 70–99)
Potassium: 3.7 mmol/L (ref 3.5–5.1)
Sodium: 143 mmol/L (ref 135–145)
Total Bilirubin: 0.9 mg/dL (ref 0.3–1.2)
Total Protein: 6.3 g/dL — ABNORMAL LOW (ref 6.5–8.1)

## 2019-05-08 MED ORDER — MONTELUKAST SODIUM 10 MG PO TABS
ORAL_TABLET | ORAL | Status: AC
  Filled 2019-05-08: qty 1

## 2019-05-08 MED ORDER — PROCHLORPERAZINE MALEATE 10 MG PO TABS
10.0000 mg | ORAL_TABLET | Freq: Once | ORAL | Status: AC
  Administered 2019-05-08: 09:00:00 10 mg via ORAL

## 2019-05-08 MED ORDER — METHYLPREDNISOLONE SODIUM SUCC 125 MG IJ SOLR
INTRAMUSCULAR | Status: AC
  Filled 2019-05-08: qty 2

## 2019-05-08 MED ORDER — METHYLPREDNISOLONE SODIUM SUCC 125 MG IJ SOLR
100.0000 mg | Freq: Once | INTRAMUSCULAR | Status: AC
  Administered 2019-05-08: 100 mg via INTRAVENOUS

## 2019-05-08 MED ORDER — ACETAMINOPHEN 325 MG PO TABS
650.0000 mg | ORAL_TABLET | Freq: Once | ORAL | Status: AC
  Administered 2019-05-08: 650 mg via ORAL

## 2019-05-08 MED ORDER — DEXTROSE 5 % IV SOLN
55.0000 mg/m2 | Freq: Once | INTRAVENOUS | Status: AC
  Administered 2019-05-08: 10:00:00 130 mg via INTRAVENOUS
  Filled 2019-05-08: qty 60

## 2019-05-08 MED ORDER — FAMOTIDINE IN NACL 20-0.9 MG/50ML-% IV SOLN
20.0000 mg | Freq: Once | INTRAVENOUS | Status: AC
  Administered 2019-05-08: 20 mg via INTRAVENOUS

## 2019-05-08 MED ORDER — SODIUM CHLORIDE 0.9 % IV SOLN
Freq: Once | INTRAVENOUS | Status: AC
  Administered 2019-05-08: 09:00:00 via INTRAVENOUS
  Filled 2019-05-08: qty 250

## 2019-05-08 MED ORDER — PROCHLORPERAZINE MALEATE 10 MG PO TABS
ORAL_TABLET | ORAL | Status: AC
  Filled 2019-05-08: qty 1

## 2019-05-08 MED ORDER — ACETAMINOPHEN 325 MG PO TABS
ORAL_TABLET | ORAL | Status: AC
  Filled 2019-05-08: qty 2

## 2019-05-08 MED ORDER — FAMOTIDINE IN NACL 20-0.9 MG/50ML-% IV SOLN
INTRAVENOUS | Status: AC
  Filled 2019-05-08: qty 50

## 2019-05-08 MED ORDER — DIPHENHYDRAMINE HCL 25 MG PO CAPS
ORAL_CAPSULE | ORAL | Status: AC
  Filled 2019-05-08: qty 2

## 2019-05-08 MED ORDER — DIPHENHYDRAMINE HCL 25 MG PO CAPS
50.0000 mg | ORAL_CAPSULE | Freq: Once | ORAL | Status: AC
  Administered 2019-05-08: 50 mg via ORAL

## 2019-05-08 MED ORDER — SODIUM CHLORIDE 0.9 % IV SOLN
15.6000 mg/kg | Freq: Once | INTRAVENOUS | Status: AC
  Administered 2019-05-08: 11:00:00 1700 mg via INTRAVENOUS
  Filled 2019-05-08: qty 80

## 2019-05-08 MED ORDER — MONTELUKAST SODIUM 10 MG PO TABS
10.0000 mg | ORAL_TABLET | Freq: Once | ORAL | Status: AC
  Administered 2019-05-08: 10 mg via ORAL

## 2019-05-08 NOTE — Patient Instructions (Signed)
[facility] Discharge Instructions for Patients Receiving Chemotherapy  Today you received the following chemotherapy agents:  carfilzomib (Kyprolis), daratumumab (Darzalex)  To help prevent nausea and vomiting after your treatment, we encourage you to take your nausea medication as prescribed.   If you develop nausea and vomiting that is not controlled by your nausea medication, call the clinic.   BELOW ARE SYMPTOMS THAT SHOULD BE REPORTED IMMEDIATELY:  *FEVER GREATER THAN 100.5 F  *CHILLS WITH OR WITHOUT FEVER  NAUSEA AND VOMITING THAT IS NOT CONTROLLED WITH YOUR NAUSEA MEDICATION  *UNUSUAL SHORTNESS OF BREATH  *UNUSUAL BRUISING OR BLEEDING  TENDERNESS IN MOUTH AND THROAT WITH OR WITHOUT PRESENCE OF ULCERS  *URINARY PROBLEMS  *BOWEL PROBLEMS  UNUSUAL RASH Items with * indicate a potential emergency and should be followed up as soon as possible.  Feel free to call the clinic should you have any questions or concerns. The clinic phone number is [phone].  Please show the [place] at check-in to the Emergency Department and triage nurse.  Coronavirus (COVID-19) Are you at risk?  Are you at risk for the Coronavirus (COVID-19)?  To be considered HIGH RISK for Coronavirus (COVID-19), you have to meet the following criteria:  . Traveled to [place], [place], [place], [place] or [place]; or in the [place] to [place], [place], [place], or [place]; and have fever, cough, and shortness of breath within the last 2 weeks of travel OR . Been in close contact with a person diagnosed with COVID-19 within the last 2 weeks and have fever, cough, and shortness of breath . IF YOU DO NOT MEET THESE CRITERIA, YOU ARE CONSIDERED LOW RISK FOR COVID-19.  What to do if you are HIGH RISK for COVID-19?  [name] If you are having a medical emergency, call 911. . Seek medical care right away. Before you go to a doctor's office, urgent care or emergency department,  call ahead and tell them about your recent travel, contact with someone diagnosed with COVID-19, and your symptoms. You should receive instructions from your physician's office regarding next steps of care.  . When you arrive at healthcare provider, tell the healthcare staff immediately you have returned from visiting [place], [place], [place], [place] or [place]; or traveled in the [place] to [place], [place], [place], or [place]; in the last two weeks or you have been in close contact with a person diagnosed with COVID-19 in the last 2 weeks.   . Tell the health care staff about your symptoms: fever, cough and shortness of breath. . After you have been seen by a medical provider, you will be either: o Tested for (COVID-19) and discharged home on quarantine except to seek medical care if symptoms worsen, and asked to  - Stay home and avoid contact with others until you get your results (4-5 days)  - Avoid travel on public transportation if possible (such as bus, train, or airplane) or o Sent to the Emergency Department by EMS for evaluation, COVID-19 testing, and possible admission depending on your condition and test results.  What to do if you are LOW RISK for COVID-19?  Reduce your risk of any infection by using the same precautions used for avoiding the common cold or flu:  [name] Wash your hands often with soap and warm water for at least 20 seconds.  If soap and water are not readily available, use an alcohol-based hand sanitizer with at least 60% alcohol.  [name]  If coughing or sneezing, cover your mouth and nose by coughing or sneezing into the elbow areas of your shirt or coat, into a tissue or into your sleeve (not your hands). . Avoid shaking hands with others and consider head nods or verbal greetings only. . Avoid touching your eyes, nose, or mouth with unwashed hands.  . Avoid close contact with people who are sick. . Avoid places or events with large numbers of people in one  location, like concerts or sporting events. . Carefully consider travel plans you have or are making. . If you are planning any travel outside or inside the [place], visit the CDC's Travelers' Health webpage for the latest health notices. . If you have some symptoms but not all symptoms, continue to monitor at home and seek medical attention if your symptoms worsen. . If you are having a medical emergency, call 911.   [place] / e-Visit: [email or web address]         [facility]: [facility]: [phone]                   [facility]: [phone]

## 2019-05-10 LAB — MULTIPLE MYELOMA PANEL, SERUM
Albumin SerPl Elph-Mcnc: 3.8 g/dL (ref 2.9–4.4)
Albumin/Glob SerPl: 2 — ABNORMAL HIGH (ref 0.7–1.7)
Alpha 1: 0.2 g/dL (ref 0.0–0.4)
Alpha2 Glob SerPl Elph-Mcnc: 0.6 g/dL (ref 0.4–1.0)
B-Globulin SerPl Elph-Mcnc: 0.8 g/dL (ref 0.7–1.3)
Gamma Glob SerPl Elph-Mcnc: 0.4 g/dL (ref 0.4–1.8)
Globulin, Total: 2 g/dL — ABNORMAL LOW (ref 2.2–3.9)
IgA: 9 mg/dL — ABNORMAL LOW (ref 61–437)
IgG (Immunoglobin G), Serum: 542 mg/dL — ABNORMAL LOW (ref 603–1613)
IgM (Immunoglobulin M), Srm: 14 mg/dL — ABNORMAL LOW (ref 20–172)
M Protein SerPl Elph-Mcnc: 0.3 g/dL — ABNORMAL HIGH
Total Protein ELP: 5.8 g/dL — ABNORMAL LOW (ref 6.0–8.5)

## 2019-05-20 NOTE — Progress Notes (Signed)
HEMATOLOGY/ONCOLOGY CLINIC NOTE  Date of Service: 05/21/19     Patient Care Team: [name], MD as PCP - General (Family Medicine) [name], MD as Consulting Physician (Hematology) [name], MD as Attending Physician (Radiation Oncology) [name], RN as Oncology Nurse Navigator (Oncology) [name] MD ([facility] _ primary oncology) phone number [phone] Email- [email or web address]  CHIEF COMPLAINTS  followup of multiple myeloma.  HISTORY OF PRESENTING ILLNESS:   Michael [name] is a wonderful 68 y.o. 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 Patient is currently on a study protocol and is being monitored for myeloma recurrence.  Based on available records being put together  -Patient was diagnosed with IgG kappa ISS stage I multiple myeloma with hyperdiploid complex cytogenetics in 2012. Bone marrow biopsy apparently showed 50% kappa restricted plasma cells with an initial M spike of 3.4 g/dL with evidence of lytic lesions on his PET/CT scan including right rib fracture and lesions of the lumbar and cervical spine. -Patient was treated under protocol 11-C-0221 with from 02/21/2013 with  Carfilzomib/Revlimid/Dexamethasone for a total of 8 cycles. He reports that his stem cells were collected and stored after 4 cycles -Posttreatment bone marrow biopsy showed less than 5% plasma cells with a negative flow cytometry and improvement in his previously PET avid lesions. Some concern for new focus of activity at C2 lytic lesion at L4 and several small lesions throughout the vertebrae. -Patient was on maintenance lenalidomide 10 mg by  mouth daily for 2 years or more until end of 2014. 08/19/2013 - bone marrow biopsy showed normocellular marrow with less than 5% plasma cells. PET CT scan showed decreased focal metabolic ligament prior rib lesions and no new lesions. Flow cytometry was negative for residual disease. Serum and urine electrophoresis and IFE showed no evidence of monoclonal protein. -Patient notes that he was off protocol for a few years due to lack of clinical trial research funding. -Patient notes that he is back on a follow-up protocol and continues to follow and receive his primary treatment at [facility] at this time. -Patient reports he had his last PET CT scan blood and urine tests and bone marrow examination in February 2018. The results of which are not available to [place] currently.  He notes that there was concern for a very slowly growing sternal FDG avid lesion that has been present for years. He has a follow-up at [facility] next month to determine the management of this lesion. He reports that was some consideration of considering radiation. he notes that this lesion has not been biopsied. No other focal bone pains at this time.  We discussed in detail about what our role will be and noted that we shall be available for any acute issues that need to be taken care of locally but that at this time the primary treatment and evaluation will be driven by his team at [facility] as per their research protocols and input. The patient and his wife are clearly aware of this and are in agreement with this plan.  INTERVAL HISTORY  Michael [name] comes is here for continued management of his Multiple Myeloma on C8D1 Daratumumab, Dexamethasone, and 53m/m2 Carfilzomib. The patient's last visit  with [place] was on 04/23/2019. The pt reports that he is doing well overall.  The pt reports difficulty swallowing that began about 3 wks ago. He also has some new left rib pain, which may because he recently mowed his lawn.  Lab results  today (05/21/2019) of CBC w/diff and CMP is as follows: all values are WNL except for WBC at 3.3k, RBC at 3.60, HGB at 12.0, HCT at 36.8, MCV at 102.2, lymphs abs at 0.4, glucose at 108, total protein at 6.3, AST at 13  On review of systems, pt reports rib pain, difficulty swallowing, and denies any other symptoms.   MEDICAL HISTORY:  Past Medical History:  Diagnosis Date  . Angioedema   . CPAP (continuous positive airway pressure) dependence    uses at night  . Gastroesophageal reflux disease without esophagitis   . History of radiation therapy 06/04/18- 06/15/18   25 Gy in 10 fractions to the laryngeal/ cartilage lesion  . Hyperlipidemia   . Multiple myeloma ([place]) 2012   Treated at [facility]  . Non morbid obesity due to excess calories   . Prediabetes   . Prediabetes   . Unspecified glaucoma(365.9)   . Urticaria, unspecified   Diabetes mellitus Glaucoma Left toes neuropathy  GERD  SURGICAL HISTORY: Past Surgical History:  Procedure Laterality Date  . IR RADIOLOGIST EVAL & MGMT  11/13/2018  . PORTACATH PLACEMENT    . PORTACATH PLACEMENT     removed 02/2016    SOCIAL HISTORY: Social History   Socioeconomic History  . Marital status: Married    Spouse name: Not on file  . Number of children: 2  . Years of education: Not on file  . Highest education level: Not on file  Occupational History  . Occupation: Retired Chief Strategy Officer for [employer]  . Financial resource strain: Not on file  . Food insecurity    Worry: Not on file    Inability: Not on file  . Transportation needs    Medical: Not on file    Non-medical: Not on file  Tobacco Use  . Smoking status: Never Smoker  . Smokeless tobacco: Never Used  Substance and Sexual Activity  . Alcohol use: Yes    Alcohol/week: 0.0 standard drinks    Comment: Occasional beer  . Drug use: No  . Sexual activity: Not on file  Lifestyle  . Physical activity    Days per week: Not on file    Minutes per session: Not on  file  . Stress: Not on file  Relationships  . Social Herbalist on phone: Not on file    Gets together: Not on file    Attends religious service: Not on file    Active member of club or organization: Not on file    Attends meetings of clubs or organizations: Not on file    Relationship status: Not on file  . Intimate partner violence    Fear of current or ex partner: No    Emotionally abused: No    Physically abused: No    Forced sexual activity: No  Other Topics Concern  . Not on file  Social History Narrative   Unable to [employer] partner violence- partner in room   Never smoker Married Retired from the [employer] in June 2017 and moved to [facility].  FAMILY HISTORY: Family History  Problem Relation Age of Onset  . Cancer Father     ALLERGIES:  has No  Known Allergies.  MEDICATIONS:  Current Outpatient Medications  Medication Sig Dispense Refill  . acyclovir (ZOVIRAX) 400 MG tablet Take 1 tablet (400 mg total) by mouth 2 (two) times daily. 60 tablet 11  . aspirin 81 MG tablet Take 81 mg by mouth daily.    [name] atorvastatin (LIPITOR) 20 MG tablet Take 20 mg by mouth every evening.     [name] dexamethasone (DECADRON) 4 MG tablet 86m (5 tabs) with breakfast the day after each daratumumab treatment 20 tablet 4  . Lido-Capsaicin-Men-Methyl Sal (MEDI-PATCH-LIDOCAINE) 0.5-0.035-5-20 % PTCH Apply 1 patch topically 2 (two) times daily as needed. (Patient not taking: Reported on 04/09/2019) 30 patch 0  . Multiple Vitamin (MULTIVITAMIN) tablet Take 1 tablet by mouth daily.    .[name] KitchenNARCAN 4 MG/0.1ML LIQD nasal spray kit Place 1 spray into the nose once.     . nystatin (MYCOSTATIN/NYSTOP) powder Apply 100,000 g topically daily as needed (rash).     . ondansetron (ZOFRAN) 8 MG tablet Take 1 tablet (8 mg total) by mouth 2 (two) times daily as needed (Nausea or vomiting). 30 tablet 1  . oxyCODONE (OXY IR/ROXICODONE) 5 MG immediate release tablet Take 1-2 tablets (5-10  mg total) by mouth every 4 (four) hours as needed for severe pain or breakthrough pain. 60 tablet 0  . pantoprazole (PROTONIX) 40 MG tablet Take 40 mg by mouth daily.     . prochlorperazine (COMPAZINE) 10 MG tablet Take 1 tablet (10 mg total) by mouth every 6 (six) hours as needed (Nausea or vomiting). 30 tablet 1  . senna-docusate (SENOKOT-S) 8.6-50 MG tablet Take 2 tablets by mouth at bedtime. Takes 50 mg 60 tablet 3   No current facility-administered medications for this visit.     REVIEW OF SYSTEMS:    A 10+ POINT REVIEW OF SYSTEMS WAS OBTAINED including neurology, dermatology, psychiatry, cardiac, respiratory, lymph, extremities, GI, GU, Musculoskeletal, constitutional, breasts, reproductive, HEENT.  All pertinent positives are noted in the HPI.  All others are negative.   PHYSICAL EXAMINATION: ECOG PERFORMANCE STATUS: 1 - Symptomatic but completely ambulatory  Vitals:   05/21/19 1004  BP: (!) 156/69  Pulse: 66  Resp: 18  Temp: 98.2 F (36.8 C)  TempSrc: Temporal  SpO2: 100%  Weight: 226 lb (102.5 kg)  Height: _0  (1.854 m)   Body mass index is 29.82 kg/m.   GENERAL:alert, in no acute distress and comfortable SKIN: no acute rashes, no significant lesions EYES: conjunctiva are pink and non-injected, sclera anicteric OROPHARYNX: MMM, no exudates, no oropharyngeal erythema or ulceration NECK: supple, no JVD LYMPH:  no palpable lymphadenopathy in the cervical, axillary or inguinal regions LUNGS: clear to auscultation b/l with normal respiratory effort HEART: regular rate & rhythm ABDOMEN:  normoactive bowel sounds , non tender, not distended. No palpable hepatosplenomegaly.  Extremity: no pedal edema PSYCH: alert & oriented x 3 with fluent speech NEURO: no focal motor/sensory deficits   LABORATORY DATA:  I have reviewed the data as listed  Most recent MMP 05/08/2019 Component     Latest Ref Rng & Units 05/08/2019  IgG (Immunoglobin G), Serum     603 - 1,613 mg/dL  542 (L)  IgA     61 - 437 mg/dL 9 (L)  IgM (Immunoglobulin M), Srm     20 - 172 mg/dL 14 (L)  Total Protein ELP     6.0 - 8.5 g/dL 5.8 (L)  Albumin SerPl Elph-Mcnc     2.9 - 4.4 g/dL 3.8  Alpha 1  0.0 - 0.4 g/dL 0.2  Alpha2 Glob SerPl Elph-Mcnc     0.4 - 1.0 g/dL 0.6  B-Globulin SerPl Elph-Mcnc     0.7 - 1.3 g/dL 0.8  Gamma Glob SerPl Elph-Mcnc     0.4 - 1.8 g/dL 0.4  M Protein SerPl Elph-Mcnc     Not Observed g/dL 0.3 (H)  Globulin, Total     2.2 - 3.9 g/dL 2.0 (L)  Albumin/Glob SerPl     0.7 - 1.7 2.0 (H)  IFE 1      Comment  Please Note (HCV):      Comment   . CBC Latest Ref Rng & Units 05/21/2019 05/08/2019 05/01/2019  WBC 4.0 - 10.5 K/uL 3.3(L) 4.3 5.4  Hemoglobin 13.0 - 17.0 g/dL 12.0(L) 11.2(L) 10.9(L)  Hematocrit 39.0 - 52.0 % 36.8(L) 33.3(L) 32.3(L)  Platelets 150 - 400 K/uL 190 160 128(L)    . CMP Latest Ref Rng & Units 05/21/2019 05/08/2019 05/01/2019  Glucose 70 - 99 mg/dL 108(H) 106(H) 108(H)  BUN 8 - 23 mg/dL _0 Creatinine 0.61 - 1.24 mg/dL 0.77 0.78 0.77  Sodium 135 - 145 mmol/L 142 143 140  Potassium 3.5 - 5.1 mmol/L 4.2 3.7 3.3(L)  Chloride 98 - 111 mmol/L 107 111 109  CO2 22 - 32 mmol/L _1 Calcium 8.9 - 10.3 mg/dL 9.0 9.0 8.6(L)  Total Protein 6.5 - 8.1 g/dL 6.3(L) 6.3(L) 6.2(L)  Total Bilirubin 0.3 - 1.2 mg/dL 1.2 0.9 1.6(H)  Alkaline Phos 38 - 126 U/L 70 63 73  AST 15 - 41 U/L 13(L) 16 12(L)  ALT 0 - 44 U/L _2 05/08/18 Left Neck Thyroid Cartilage Biopsy:        RADIOGRAPHIC STUDIES: I have personally reviewed the radiological images as listed and agreed with the findings in the report. No results found.  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 Bone marrow biopsy apparently showed 50% kappa restricted plasma cells with an initial M spike of 3.4  g/dL with evidence of lytic lesions on his PET/CT scan including right rib fracture and lesions of the lumbar and cervical spine. -Patient was treated under protocol 11-C-0221 with from 02/21/2013 with  Carfilzomib/Revlimid/Dexamethasone for a total of 8 cycles. He reports that his stem cells were collected and stored after 4 cycles -Posttreatment bone marrow biopsy showed less than 5% plasma cells with a negative flow cytometry and improvement in his previously PET avid lesions. Some concern for new focus of activity at C2 lytic lesion at L4 and several small lesions throughout the vertebrae. -Patient was on maintenance lenalidomide 10 mg by mouth daily for 2 years or more until end of 2014. 08/19/2013 - bone marrow biopsy showed normocellular marrow with less than 5% plasma cells. PET CT scan showed decreased focal metabolic ligament prior rib lesions and no new lesions. Flow cytometry was negative for residual disease. Serum and urine electrophoresis and IFE showed no evidence of monoclonal protein. -Patient notes that he was off protocol for a few years due to lack of clinical trial research funding. -Patient returned to follow-up protocol and began to follow and receive his primary treatment at [facility]  -Patient's labs show minimal IgG kappa protein on IFE and a CT chest in May 2018 that did not show any overt signs  of myeloma progression in the bones.  Rpt Myeloma labs 03/2017 - M spike of 0.3g/dl with a repeat M spike stable at 0.3 g/dL on 05/23/2017 which remains stable on labs today 07/25/17 Serum kappa lambda free light chain ratio not significantly changed. PET/CT scan showed a single metabolically active sternal lesion without any overt bony destruction. Bone marrow biopsy done on 06/06/2017 - shows no overt involvement with multiple myeloma.  04/24/18 CT Soft Tissue Neck revealed Left laryngeal 3 cm soft tissue mass appears to have originated within and is expanding the left thyroid cartilage.  This is new since the August 2018 PET-CT, and there is absent lymphadenopathy. 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 Primary cartilaginous neoplasm, squamous cell carcinoma, and other differential considerations are less likely. Superimposed widespread multiple myeloma lesions in the visible skeleton.  05/08/18 Left neck thyroid cartilage biopsy revealed Plasma cell neoplasm.  05/22/18 PET/CT revealed Soft tissue mass centered around the destroyed left thyroid cartilage is hypermetabolic and consistent with known plasmacytoma. No adenopathy in the neck. 2. Hypermetabolic 3 cm cutaneous and subcutaneous soft tissue mass involving the posterior upper thorax suspicious for cutaneous manifestation of myeloma (plasmacytoma). 3. Persistent and slightly progressive hypermetabolic sternal lesion. 4. Diffuse lytic myelomatous lesions throughout the bony structures but no other hypermetabolic foci. 5. Focus of hypermetabolism in the left aspect of the lower prostate gland, not present on the prior study. This could be an area of infection/inflammation or potential prostate cancer. Recommend correlation with physical examination and PSA level.    Completed RT between 06/04/18 and 06/15/18 of 25 Gy by 10 fractions  The pt pursued a clinical trial at the [facility] with RT and immunotherapy in October 2019 through October 18, 2018 with further information available at [email or web address]  10/18/18 M Protein increased to 3.5g, from 0.7g at our last visit on 07/10/18. 10/02/18 PET/CT from [facility] indicated new hypermetabolism in left clavicle, left scapula, bilateral ribs, spine, and left proximal femur 10/18/18 MRI from [facility] ndicated a pathologic fracture at T12  10/2018 M Protein at 4.1g  11/23/18 NM Bone revealed Patchy uptake throughout the ribs and spine with areas of focally increased activity in both T12 pedicles and the right L1 pedicle. This focal activity could be  stress related without clear corresponding fracture on available prior studies.  2) h/o Elevated bilirubin level . ? revlmid vs [place]  3) PET/CT abnormality in the Prostate. PSA levels WNL -Primary care physician to consider urology referral  4) Abnormal focus of FDG avid at a spot in the liver - will need to be monitor on f/u scans. No pain or other symptoms at this time.  PLAN:  -Discussed pt labwork today, 05/21/2019; M protein is continuing to decrease -Discussed that RT can cause tightening in the throat. Recommend speech therapy evaluation and barium swallow if his swallowing worsens. -Pt meets criteria for "very good partial response" -- It is difficult to tell if IgG Kappa elevation is due to Daratumumab or myeloma. -Discuss that when treatment response plateaus, will switch to maintenance treatment. The pt is not interested in a transplant at this time because of covid-19.  -The pt has no prohibitive toxicities from continuing C8D1 Daratumumab, Dexamethasone, and 55m/m2 Carfilzomib at this time. -Regarding maintenance therapy: would consider adding Pomalidomide to monthly Daratumumab vs every two weeks Carfilzomib -Continue Zometa every 4 weeks -Continue Acyclovir -Cancel D8 Carfilzomib -Plan to repeat PET scan and BM Bx after next cycle, then will determine which maintenance treatment  is most appropriate -Refill Dexamethasone -Will see the pt back in 4 wks   Patient starting C8D1 tomorrow. Plz cancel C8D8 Carfilzomib rx and labs (Keep D1 and D15) CT bone marrow bx in 3 weeks PET/CT in 3 weeks Plz schedule C9 of treatment as ordered RTC with Dr Irene Limbo in 4 weeks with C9D1   All of the patients questions were answered with apparent satisfaction. The patient knows to call the clinic with any problems, questions or concerns.  The total time spent in the appt was 25 minutes and more than 50% was on counseling and direct patient cares.   Sullivan Lone MD [place] AAHIVMS [facility]  Hematology/Oncology Physician [facility]  (Office):       [phone] (Work cell):  [phone] (Fax):           [phone]  I, De Burrs, am acting as a scribe for Dr. Irene Limbo  .I have reviewed the above documentation for accuracy and completeness, and I agree with the above. [name] MD

## 2019-05-21 ENCOUNTER — Inpatient Hospital Stay: Payer: Medicare Other | Attending: Hematology

## 2019-05-21 ENCOUNTER — Other Ambulatory Visit: Payer: Self-pay

## 2019-05-21 ENCOUNTER — Inpatient Hospital Stay (HOSPITAL_BASED_OUTPATIENT_CLINIC_OR_DEPARTMENT_OTHER): Payer: Medicare Other | Admitting: Hematology

## 2019-05-21 ENCOUNTER — Telehealth: Payer: Self-pay | Admitting: Hematology

## 2019-05-21 VITALS — BP 156/69 | HR 66 | Temp 98.2°F | Resp 18 | Ht 73.0 in | Wt 226.0 lb

## 2019-05-21 DIAGNOSIS — C9000 Multiple myeloma not having achieved remission: Secondary | ICD-10-CM

## 2019-05-21 DIAGNOSIS — Z7982 Long term (current) use of aspirin: Secondary | ICD-10-CM | POA: Insufficient documentation

## 2019-05-21 DIAGNOSIS — C9002 Multiple myeloma in relapse: Secondary | ICD-10-CM | POA: Diagnosis not present

## 2019-05-21 DIAGNOSIS — E119 Type 2 diabetes mellitus without complications: Secondary | ICD-10-CM | POA: Insufficient documentation

## 2019-05-21 DIAGNOSIS — Z79899 Other long term (current) drug therapy: Secondary | ICD-10-CM | POA: Diagnosis not present

## 2019-05-21 DIAGNOSIS — Z5112 Encounter for antineoplastic immunotherapy: Secondary | ICD-10-CM | POA: Diagnosis not present

## 2019-05-21 DIAGNOSIS — G893 Neoplasm related pain (acute) (chronic): Secondary | ICD-10-CM

## 2019-05-21 DIAGNOSIS — E785 Hyperlipidemia, unspecified: Secondary | ICD-10-CM | POA: Diagnosis not present

## 2019-05-21 LAB — CMP (CANCER CENTER ONLY)
ALT: 17 U/L (ref 0–44)
AST: 13 U/L — ABNORMAL LOW (ref 15–41)
Albumin: 4 g/dL (ref 3.5–5.0)
Alkaline Phosphatase: 70 U/L (ref 38–126)
Anion gap: 8 (ref 5–15)
BUN: 8 mg/dL (ref 8–23)
CO2: 27 mmol/L (ref 22–32)
Calcium: 9 mg/dL (ref 8.9–10.3)
Chloride: 107 mmol/L (ref 98–111)
Creatinine: 0.77 mg/dL (ref 0.61–1.24)
GFR, Est AFR Am: >60 mL/min (ref >60)
GFR, Estimated: >60 mL/min (ref >60)
Glucose, Bld: 108 mg/dL — ABNORMAL HIGH (ref 70–99)
Potassium: 4.2 mmol/L (ref 3.5–5.1)
Sodium: 142 mmol/L (ref 135–145)
Total Bilirubin: 1.2 mg/dL (ref 0.3–1.2)
Total Protein: 6.3 g/dL — ABNORMAL LOW (ref 6.5–8.1)

## 2019-05-21 LAB — CBC WITH DIFFERENTIAL/PLATELET
Abs Immature Granulocytes: 0.01 10*3/uL (ref 0.00–0.07)
Basophils Absolute: 0 10*3/uL (ref 0.0–0.1)
Basophils Relative: 1 %
Eosinophils Absolute: 0.1 10*3/uL (ref 0.0–0.5)
Eosinophils Relative: 2 %
HCT: 36.8 % — ABNORMAL LOW (ref 39.0–52.0)
Hemoglobin: 12 g/dL — ABNORMAL LOW (ref 13.0–17.0)
Immature Granulocytes: 0 %
Lymphocytes Relative: 13 %
Lymphs Abs: 0.4 10*3/uL — ABNORMAL LOW (ref 0.7–4.0)
MCH: 33.3 pg (ref 26.0–34.0)
MCHC: 32.6 g/dL (ref 30.0–36.0)
MCV: 102.2 fL — ABNORMAL HIGH (ref 80.0–100.0)
Monocytes Absolute: 0.3 10*3/uL (ref 0.1–1.0)
Monocytes Relative: 10 %
Neutro Abs: 2.5 10*3/uL (ref 1.7–7.7)
Neutrophils Relative %: 74 %
Platelets: 190 10*3/uL (ref 150–400)
RBC: 3.6 MIL/uL — ABNORMAL LOW (ref 4.22–5.81)
RDW: 13.5 % (ref 11.5–15.5)
WBC: 3.3 10*3/uL — ABNORMAL LOW (ref 4.0–10.5)
nRBC: 0 % (ref 0.0–0.2)

## 2019-05-21 NOTE — Telephone Encounter (Signed)
Scheduled appt per 8/4 los.  Spoke with patient and he is aware of his appt date and time.

## 2019-05-22 ENCOUNTER — Other Ambulatory Visit: Payer: Self-pay

## 2019-05-22 ENCOUNTER — Other Ambulatory Visit: Payer: Medicare Other

## 2019-05-22 ENCOUNTER — Inpatient Hospital Stay: Payer: Medicare Other

## 2019-05-22 VITALS — BP 138/82 | HR 74 | Temp 98.5°F | Resp 20 | Wt 222.5 lb

## 2019-05-22 DIAGNOSIS — Z79899 Other long term (current) drug therapy: Secondary | ICD-10-CM | POA: Diagnosis not present

## 2019-05-22 DIAGNOSIS — C9000 Multiple myeloma not having achieved remission: Secondary | ICD-10-CM

## 2019-05-22 DIAGNOSIS — Z5112 Encounter for antineoplastic immunotherapy: Secondary | ICD-10-CM | POA: Diagnosis not present

## 2019-05-22 DIAGNOSIS — Z7982 Long term (current) use of aspirin: Secondary | ICD-10-CM | POA: Diagnosis not present

## 2019-05-22 DIAGNOSIS — C9002 Multiple myeloma in relapse: Secondary | ICD-10-CM

## 2019-05-22 DIAGNOSIS — E119 Type 2 diabetes mellitus without complications: Secondary | ICD-10-CM | POA: Diagnosis not present

## 2019-05-22 DIAGNOSIS — Z7189 Other specified counseling: Secondary | ICD-10-CM

## 2019-05-22 DIAGNOSIS — E785 Hyperlipidemia, unspecified: Secondary | ICD-10-CM | POA: Diagnosis not present

## 2019-05-22 MED ORDER — DIPHENHYDRAMINE HCL 25 MG PO CAPS
ORAL_CAPSULE | ORAL | Status: AC
  Filled 2019-05-22: qty 2

## 2019-05-22 MED ORDER — SODIUM CHLORIDE 0.9 % IV SOLN
Freq: Once | INTRAVENOUS | Status: AC
  Administered 2019-05-22: 09:00:00 via INTRAVENOUS
  Filled 2019-05-22: qty 250

## 2019-05-22 MED ORDER — FAMOTIDINE IN NACL 20-0.9 MG/50ML-% IV SOLN
INTRAVENOUS | Status: AC
  Filled 2019-05-22: qty 50

## 2019-05-22 MED ORDER — PROCHLORPERAZINE MALEATE 10 MG PO TABS
10.0000 mg | ORAL_TABLET | Freq: Once | ORAL | Status: AC
  Administered 2019-05-22: 10 mg via ORAL

## 2019-05-22 MED ORDER — ACETAMINOPHEN 325 MG PO TABS
ORAL_TABLET | ORAL | Status: AC
  Filled 2019-05-22: qty 1

## 2019-05-22 MED ORDER — METHYLPREDNISOLONE SODIUM SUCC 125 MG IJ SOLR
INTRAMUSCULAR | Status: AC
  Filled 2019-05-22: qty 2

## 2019-05-22 MED ORDER — METHYLPREDNISOLONE SODIUM SUCC 125 MG IJ SOLR
100.0000 mg | Freq: Once | INTRAMUSCULAR | Status: AC
  Administered 2019-05-22: 100 mg via INTRAVENOUS

## 2019-05-22 MED ORDER — ACETAMINOPHEN 325 MG PO TABS
650.0000 mg | ORAL_TABLET | Freq: Once | ORAL | Status: AC
  Administered 2019-05-22: 650 mg via ORAL

## 2019-05-22 MED ORDER — DEXTROSE 5 % IV SOLN
55.0000 mg/m2 | Freq: Once | INTRAVENOUS | Status: AC
  Administered 2019-05-22: 130 mg via INTRAVENOUS
  Filled 2019-05-22: qty 60

## 2019-05-22 MED ORDER — MONTELUKAST SODIUM 10 MG PO TABS
ORAL_TABLET | ORAL | Status: AC
  Filled 2019-05-22: qty 1

## 2019-05-22 MED ORDER — DIPHENHYDRAMINE HCL 25 MG PO CAPS
50.0000 mg | ORAL_CAPSULE | Freq: Once | ORAL | Status: AC
  Administered 2019-05-22: 50 mg via ORAL

## 2019-05-22 MED ORDER — SODIUM CHLORIDE 0.9 % IV SOLN
15.6000 mg/kg | Freq: Once | INTRAVENOUS | Status: AC
  Administered 2019-05-22: 1700 mg via INTRAVENOUS
  Filled 2019-05-22: qty 80

## 2019-05-22 MED ORDER — MONTELUKAST SODIUM 10 MG PO TABS
10.0000 mg | ORAL_TABLET | Freq: Once | ORAL | Status: AC
  Administered 2019-05-22: 10 mg via ORAL

## 2019-05-22 MED ORDER — ACETAMINOPHEN 325 MG PO TABS
ORAL_TABLET | ORAL | Status: AC
  Filled 2019-05-22: qty 2

## 2019-05-22 MED ORDER — PROCHLORPERAZINE MALEATE 10 MG PO TABS
ORAL_TABLET | ORAL | Status: AC
  Filled 2019-05-22: qty 1

## 2019-05-22 MED ORDER — FAMOTIDINE IN NACL 20-0.9 MG/50ML-% IV SOLN
20.0000 mg | Freq: Once | INTRAVENOUS | Status: AC
  Administered 2019-05-22: 20 mg via INTRAVENOUS

## 2019-05-22 MED ORDER — ZOLEDRONIC ACID 4 MG/100ML IV SOLN
4.0000 mg | Freq: Once | INTRAVENOUS | Status: AC
  Administered 2019-05-22: 4 mg via INTRAVENOUS
  Filled 2019-05-22: qty 100

## 2019-05-22 NOTE — Patient Instructions (Signed)
[facility] Discharge Instructions for Patients Receiving Chemotherapy  Today you received the following Immunotherapy agents: Kyprolis, Darzalex and other agent: Zometa  To help prevent nausea and vomiting after your treatment, we encourage you to take your nausea medication as directed by your MD.   If you develop nausea and vomiting that is not controlled by your nausea medication, call the clinic.   BELOW ARE SYMPTOMS THAT SHOULD BE REPORTED IMMEDIATELY:  *FEVER GREATER THAN 100.5 F  *CHILLS WITH OR WITHOUT FEVER  NAUSEA AND VOMITING THAT IS NOT CONTROLLED WITH YOUR NAUSEA MEDICATION  *UNUSUAL SHORTNESS OF BREATH  *UNUSUAL BRUISING OR BLEEDING  TENDERNESS IN MOUTH AND THROAT WITH OR WITHOUT PRESENCE OF ULCERS  *URINARY PROBLEMS  *BOWEL PROBLEMS  UNUSUAL RASH Items with * indicate a potential emergency and should be followed up as soon as possible.  Feel free to call the clinic should you have any questions or concerns. The clinic phone number is [phone].  Please show the [facility] at check-in to the Emergency Department and triage nurse. Coronavirus (COVID-19) Are you at risk?  Are you at risk for the Coronavirus (COVID-19)?  To be considered HIGH RISK for Coronavirus (COVID-19), you have to meet the following criteria:  . Traveled to [place], [place], [place], [place] or [place]; or in the [place] to [place], [place], [place], or [place]; and have fever, cough, and shortness of breath within the last 2 weeks of travel OR . Been in close contact with a person diagnosed with COVID-19 within the last 2 weeks and have fever, cough, and shortness of breath . IF YOU DO NOT MEET THESE CRITERIA, YOU ARE CONSIDERED LOW RISK FOR COVID-19.  What to do if you are HIGH RISK for COVID-19?  [name] If you are having a medical emergency, call 911. . Seek medical care right away. Before you go to a doctor's office, urgent care or emergency  department, call ahead and tell them about your recent travel, contact with someone diagnosed with COVID-19, and your symptoms. You should receive instructions from your physician's office regarding next steps of care.  . When you arrive at healthcare provider, tell the healthcare staff immediately you have returned from visiting [place], [place], [place], [place] or [place]; or traveled in the [place] to [place], [place], [place], or [place]; in the last two weeks or you have been in close contact with a person diagnosed with COVID-19 in the last 2 weeks.   . Tell the health care staff about your symptoms: fever, cough and shortness of breath. . After you have been seen by a medical provider, you will be either: o Tested for (COVID-19) and discharged home on quarantine except to seek medical care if symptoms worsen, and asked to  - Stay home and avoid contact with others until you get your results (4-5 days)  - Avoid travel on public transportation if possible (such as bus, train, or airplane) or o Sent to the Emergency Department by EMS for evaluation, COVID-19 testing, and possible admission depending on your condition and test results.  What to do if you are LOW RISK for COVID-19?  Reduce your risk of any infection by using the same precautions used for avoiding the common cold or flu:  [name] Wash your hands often with soap and warm water for at least 20 seconds.  If soap and water are not readily available, use an alcohol-based hand sanitizer with at least 60%  alcohol.  . If coughing or sneezing, cover your mouth and nose by coughing or sneezing into the elbow areas of your shirt or coat, into a tissue or into your sleeve (not your hands). . Avoid shaking hands with others and consider head nods or verbal greetings only. . Avoid touching your eyes, nose, or mouth with unwashed hands.  . Avoid close contact with people who are sick. . Avoid places or events with large numbers of people  in one location, like concerts or sporting events. . Carefully consider travel plans you have or are making. . If you are planning any travel outside or inside the [place], visit the CDC's Travelers' Health webpage for the latest health notices. . If you have some symptoms but not all symptoms, continue to monitor at home and seek medical attention if your symptoms worsen. . If you are having a medical emergency, call 911.   [place] / e-Visit: [email or web address]         [facility]: [facility]: [phone]                   [facility]: [phone]

## 2019-05-29 ENCOUNTER — Other Ambulatory Visit: Payer: Self-pay | Admitting: *Deleted

## 2019-05-29 ENCOUNTER — Encounter: Payer: Self-pay | Admitting: Hematology

## 2019-05-29 ENCOUNTER — Ambulatory Visit: Payer: Medicare Other

## 2019-05-29 ENCOUNTER — Other Ambulatory Visit: Payer: Medicare Other

## 2019-05-29 DIAGNOSIS — Z7189 Other specified counseling: Secondary | ICD-10-CM

## 2019-05-29 DIAGNOSIS — C9000 Multiple myeloma not having achieved remission: Secondary | ICD-10-CM

## 2019-05-29 MED ORDER — DEXAMETHASONE 4 MG PO TABS: ORAL_TABLET | ORAL | 4 refills | Status: DC

## 2019-05-29 NOTE — Telephone Encounter (Signed)
Patient requested refill of dexamethasone

## 2019-06-05 ENCOUNTER — Other Ambulatory Visit: Payer: Self-pay

## 2019-06-05 ENCOUNTER — Other Ambulatory Visit: Payer: Medicare Other

## 2019-06-05 ENCOUNTER — Inpatient Hospital Stay: Payer: Medicare Other

## 2019-06-05 VITALS — BP 130/61 | HR 68 | Temp 98.4°F | Resp 17

## 2019-06-05 DIAGNOSIS — Z5112 Encounter for antineoplastic immunotherapy: Secondary | ICD-10-CM | POA: Diagnosis not present

## 2019-06-05 DIAGNOSIS — E785 Hyperlipidemia, unspecified: Secondary | ICD-10-CM | POA: Diagnosis not present

## 2019-06-05 DIAGNOSIS — Z7982 Long term (current) use of aspirin: Secondary | ICD-10-CM | POA: Diagnosis not present

## 2019-06-05 DIAGNOSIS — C9000 Multiple myeloma not having achieved remission: Secondary | ICD-10-CM

## 2019-06-05 DIAGNOSIS — Z7189 Other specified counseling: Secondary | ICD-10-CM

## 2019-06-05 DIAGNOSIS — C9002 Multiple myeloma in relapse: Secondary | ICD-10-CM | POA: Diagnosis not present

## 2019-06-05 DIAGNOSIS — E119 Type 2 diabetes mellitus without complications: Secondary | ICD-10-CM | POA: Diagnosis not present

## 2019-06-05 DIAGNOSIS — G893 Neoplasm related pain (acute) (chronic): Secondary | ICD-10-CM

## 2019-06-05 DIAGNOSIS — Z79899 Other long term (current) drug therapy: Secondary | ICD-10-CM | POA: Diagnosis not present

## 2019-06-05 LAB — CMP (CANCER CENTER ONLY)
ALT: 13 U/L (ref 0–44)
AST: 14 U/L — ABNORMAL LOW (ref 15–41)
Albumin: 3.9 g/dL (ref 3.5–5.0)
Alkaline Phosphatase: 70 U/L (ref 38–126)
Anion gap: 8 (ref 5–15)
BUN: 15 mg/dL (ref 8–23)
CO2: 23 mmol/L (ref 22–32)
Calcium: 8.4 mg/dL — ABNORMAL LOW (ref 8.9–10.3)
Chloride: 107 mmol/L (ref 98–111)
Creatinine: 0.72 mg/dL (ref 0.61–1.24)
GFR, Est AFR Am: >60 mL/min (ref >60)
GFR, Estimated: >60 mL/min (ref >60)
Glucose, Bld: 115 mg/dL — ABNORMAL HIGH (ref 70–99)
Potassium: 3.7 mmol/L (ref 3.5–5.1)
Sodium: 138 mmol/L (ref 135–145)
Total Bilirubin: 0.8 mg/dL (ref 0.3–1.2)
Total Protein: 6 g/dL — ABNORMAL LOW (ref 6.5–8.1)

## 2019-06-05 LAB — CBC WITH DIFFERENTIAL/PLATELET
Abs Immature Granulocytes: 0.02 10*3/uL (ref 0.00–0.07)
Basophils Absolute: 0 10*3/uL (ref 0.0–0.1)
Basophils Relative: 1 %
Eosinophils Absolute: 0.1 10*3/uL (ref 0.0–0.5)
Eosinophils Relative: 2 %
HCT: 35.2 % — ABNORMAL LOW (ref 39.0–52.0)
Hemoglobin: 11.8 g/dL — ABNORMAL LOW (ref 13.0–17.0)
Immature Granulocytes: 1 %
Lymphocytes Relative: 14 %
Lymphs Abs: 0.6 10*3/uL — ABNORMAL LOW (ref 0.7–4.0)
MCH: 33.1 pg (ref 26.0–34.0)
MCHC: 33.5 g/dL (ref 30.0–36.0)
MCV: 98.6 fL (ref 80.0–100.0)
Monocytes Absolute: 0.4 10*3/uL (ref 0.1–1.0)
Monocytes Relative: 11 %
Neutro Abs: 2.9 10*3/uL (ref 1.7–7.7)
Neutrophils Relative %: 71 %
Platelets: 182 10*3/uL (ref 150–400)
RBC: 3.57 MIL/uL — ABNORMAL LOW (ref 4.22–5.81)
RDW: 13 % (ref 11.5–15.5)
WBC: 4.1 10*3/uL (ref 4.0–10.5)
nRBC: 0 % (ref 0.0–0.2)

## 2019-06-05 MED ORDER — METHYLPREDNISOLONE SODIUM SUCC 125 MG IJ SOLR
100.0000 mg | Freq: Once | INTRAMUSCULAR | Status: AC
  Administered 2019-06-05: 100 mg via INTRAVENOUS

## 2019-06-05 MED ORDER — ACETAMINOPHEN 325 MG PO TABS
ORAL_TABLET | ORAL | Status: AC
  Filled 2019-06-05: qty 2

## 2019-06-05 MED ORDER — PROCHLORPERAZINE MALEATE 10 MG PO TABS
10.0000 mg | ORAL_TABLET | Freq: Once | ORAL | Status: AC
  Administered 2019-06-05: 10 mg via ORAL

## 2019-06-05 MED ORDER — DIPHENHYDRAMINE HCL 25 MG PO CAPS
ORAL_CAPSULE | ORAL | Status: AC
  Filled 2019-06-05: qty 2

## 2019-06-05 MED ORDER — FAMOTIDINE IN NACL 20-0.9 MG/50ML-% IV SOLN
20.0000 mg | Freq: Once | INTRAVENOUS | Status: AC
  Administered 2019-06-05: 09:00:00 20 mg via INTRAVENOUS

## 2019-06-05 MED ORDER — MONTELUKAST SODIUM 10 MG PO TABS
ORAL_TABLET | ORAL | Status: AC
  Filled 2019-06-05: qty 1

## 2019-06-05 MED ORDER — SODIUM CHLORIDE 0.9 % IV SOLN
Freq: Once | INTRAVENOUS | Status: AC
  Administered 2019-06-05: 09:00:00 via INTRAVENOUS
  Filled 2019-06-05: qty 250

## 2019-06-05 MED ORDER — METHYLPREDNISOLONE SODIUM SUCC 125 MG IJ SOLR
INTRAMUSCULAR | Status: AC
  Filled 2019-06-05: qty 2

## 2019-06-05 MED ORDER — DIPHENHYDRAMINE HCL 25 MG PO CAPS
50.0000 mg | ORAL_CAPSULE | Freq: Once | ORAL | Status: AC
  Administered 2019-06-05: 50 mg via ORAL

## 2019-06-05 MED ORDER — SODIUM CHLORIDE 0.9 % IV SOLN
15.6000 mg/kg | Freq: Once | INTRAVENOUS | Status: AC
  Administered 2019-06-05: 1700 mg via INTRAVENOUS
  Filled 2019-06-05: qty 80

## 2019-06-05 MED ORDER — DEXTROSE 5 % IV SOLN
55.0000 mg/m2 | Freq: Once | INTRAVENOUS | Status: AC
  Administered 2019-06-05: 130 mg via INTRAVENOUS
  Filled 2019-06-05: qty 60

## 2019-06-05 MED ORDER — PROCHLORPERAZINE MALEATE 10 MG PO TABS
ORAL_TABLET | ORAL | Status: AC
  Filled 2019-06-05: qty 1

## 2019-06-05 MED ORDER — MONTELUKAST SODIUM 10 MG PO TABS
10.0000 mg | ORAL_TABLET | Freq: Once | ORAL | Status: AC
  Administered 2019-06-05: 10 mg via ORAL

## 2019-06-05 MED ORDER — FAMOTIDINE IN NACL 20-0.9 MG/50ML-% IV SOLN
INTRAVENOUS | Status: AC
  Filled 2019-06-05: qty 50

## 2019-06-05 MED ORDER — ACETAMINOPHEN 325 MG PO TABS
650.0000 mg | ORAL_TABLET | Freq: Once | ORAL | Status: AC
  Administered 2019-06-05: 650 mg via ORAL

## 2019-06-05 NOTE — Patient Instructions (Signed)
[facility] Discharge Instructions for Patients Receiving Chemotherapy  Today you received the following Immunotherapy agents: Kyprolis and Darzalex To help prevent nausea and vomiting after your treatment, we encourage you to take your nausea medication as directed by your MD.   If you develop nausea and vomiting that is not controlled by your nausea medication, call the clinic.   BELOW ARE SYMPTOMS THAT SHOULD BE REPORTED IMMEDIATELY:  *FEVER GREATER THAN 100.5 F  *CHILLS WITH OR WITHOUT FEVER  NAUSEA AND VOMITING THAT IS NOT CONTROLLED WITH YOUR NAUSEA MEDICATION  *UNUSUAL SHORTNESS OF BREATH  *UNUSUAL BRUISING OR BLEEDING  TENDERNESS IN MOUTH AND THROAT WITH OR WITHOUT PRESENCE OF ULCERS  *URINARY PROBLEMS  *BOWEL PROBLEMS  UNUSUAL RASH Items with * indicate a potential emergency and should be followed up as soon as possible.  Feel free to call the clinic should you have any questions or concerns. The clinic phone number is [phone].  Please show the [facility] at check-in to the Emergency Department and triage nurse. Coronavirus (COVID-19) Are you at risk?  Are you at risk for the Coronavirus (COVID-19)?  To be considered HIGH RISK for Coronavirus (COVID-19), you have to meet the following criteria:  . Traveled to [place], [place], [place], [place] or [place]; or in the [place] to [place], [place], [employer], or [place]; and have fever, cough, and shortness of breath within the last 2 weeks of travel OR . Been in close contact with a person diagnosed with COVID-19 within the last 2 weeks and have fever, cough, and shortness of breath . IF YOU DO NOT MEET THESE CRITERIA, YOU ARE CONSIDERED LOW RISK FOR COVID-19.  What to do if you are HIGH RISK for COVID-19?  [name] If you are having a medical emergency, call 911. . Seek medical care right away. Before you go to a doctor's office, urgent care or emergency department, call ahead and tell  them about your recent travel, contact with someone diagnosed with COVID-19, and your symptoms. You should receive instructions from your physician's office regarding next steps of care.  . When you arrive at healthcare provider, tell the healthcare staff immediately you have returned from visiting [place], [place], [place], [place] or [place]; or traveled in the [place] to [place], [place], [place], or [place]; in the last two weeks or you have been in close contact with a person diagnosed with COVID-19 in the last 2 weeks.   . Tell the health care staff about your symptoms: fever, cough and shortness of breath. . After you have been seen by a medical provider, you will be either: o Tested for (COVID-19) and discharged home on quarantine except to seek medical care if symptoms worsen, and asked to  - Stay home and avoid contact with others until you get your results (4-5 days)  - Avoid travel on public transportation if possible (such as bus, train, or airplane) or o Sent to the Emergency Department by EMS for evaluation, COVID-19 testing, and possible admission depending on your condition and test results.  What to do if you are LOW RISK for COVID-19?  Reduce your risk of any infection by using the same precautions used for avoiding the common cold or flu:  [name] Wash your hands often with soap and warm water for at least 20 seconds.  If soap and water are not readily available, use an alcohol-based hand sanitizer with at least 60% alcohol.  . If  coughing or sneezing, cover your mouth and nose by coughing or sneezing into the elbow areas of your shirt or coat, into a tissue or into your sleeve (not your hands). . Avoid shaking hands with others and consider head nods or verbal greetings only. . Avoid touching your eyes, nose, or mouth with unwashed hands.  . Avoid close contact with people who are sick. . Avoid places or events with large numbers of people in one location, like concerts or  sporting events. . Carefully consider travel plans you have or are making. . If you are planning any travel outside or inside the [place], visit the CDC's Travelers' Health webpage for the latest health notices. . If you have some symptoms but not all symptoms, continue to monitor at home and seek medical attention if your symptoms worsen. . If you are having a medical emergency, call 911.   [place] / e-Visit: [email or web address]         [facility]: [facility]: [phone]                   [facility]: [phone]
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
## 2019-06-18 ENCOUNTER — Inpatient Hospital Stay (HOSPITAL_BASED_OUTPATIENT_CLINIC_OR_DEPARTMENT_OTHER): Payer: Medicare Other | Admitting: Hematology

## 2019-06-18 ENCOUNTER — Inpatient Hospital Stay: Payer: Medicare Other | Attending: Hematology

## 2019-06-18 ENCOUNTER — Other Ambulatory Visit: Payer: Self-pay

## 2019-06-18 VITALS — BP 149/87 | HR 63 | Temp 98.7°F | Resp 18 | Ht 73.0 in | Wt 227.2 lb

## 2019-06-18 DIAGNOSIS — Z5112 Encounter for antineoplastic immunotherapy: Secondary | ICD-10-CM | POA: Diagnosis not present

## 2019-06-18 DIAGNOSIS — G893 Neoplasm related pain (acute) (chronic): Secondary | ICD-10-CM

## 2019-06-18 DIAGNOSIS — Z23 Encounter for immunization: Secondary | ICD-10-CM | POA: Diagnosis not present

## 2019-06-18 DIAGNOSIS — C9000 Multiple myeloma not having achieved remission: Secondary | ICD-10-CM

## 2019-06-18 DIAGNOSIS — Z79899 Other long term (current) drug therapy: Secondary | ICD-10-CM | POA: Diagnosis not present

## 2019-06-18 DIAGNOSIS — Z7982 Long term (current) use of aspirin: Secondary | ICD-10-CM | POA: Insufficient documentation

## 2019-06-18 DIAGNOSIS — C9002 Multiple myeloma in relapse: Secondary | ICD-10-CM | POA: Diagnosis not present

## 2019-06-18 DIAGNOSIS — E785 Hyperlipidemia, unspecified: Secondary | ICD-10-CM | POA: Diagnosis not present

## 2019-06-18 DIAGNOSIS — E119 Type 2 diabetes mellitus without complications: Secondary | ICD-10-CM | POA: Diagnosis not present

## 2019-06-18 LAB — CBC WITH DIFFERENTIAL/PLATELET
Abs Immature Granulocytes: 0.02 10*3/uL (ref 0.00–0.07)
Basophils Absolute: 0 10*3/uL (ref 0.0–0.1)
Basophils Relative: 1 %
Eosinophils Absolute: 0.1 10*3/uL (ref 0.0–0.5)
Eosinophils Relative: 3 %
HCT: 39.6 % (ref 39.0–52.0)
Hemoglobin: 13 g/dL (ref 13.0–17.0)
Immature Granulocytes: 1 %
Lymphocytes Relative: 13 %
Lymphs Abs: 0.6 10*3/uL — ABNORMAL LOW (ref 0.7–4.0)
MCH: 33 pg (ref 26.0–34.0)
MCHC: 32.8 g/dL (ref 30.0–36.0)
MCV: 100.5 fL — ABNORMAL HIGH (ref 80.0–100.0)
Monocytes Absolute: 0.5 10*3/uL (ref 0.1–1.0)
Monocytes Relative: 12 %
Neutro Abs: 3.1 10*3/uL (ref 1.7–7.7)
Neutrophils Relative %: 70 %
Platelets: 189 10*3/uL (ref 150–400)
RBC: 3.94 MIL/uL — ABNORMAL LOW (ref 4.22–5.81)
RDW: 13 % (ref 11.5–15.5)
WBC: 4.3 10*3/uL (ref 4.0–10.5)
nRBC: 0 % (ref 0.0–0.2)

## 2019-06-18 LAB — CMP (CANCER CENTER ONLY)
ALT: 17 U/L (ref 0–44)
AST: 15 U/L (ref 15–41)
Albumin: 4.1 g/dL (ref 3.5–5.0)
Alkaline Phosphatase: 72 U/L (ref 38–126)
Anion gap: 8 (ref 5–15)
BUN: 18 mg/dL (ref 8–23)
CO2: 26 mmol/L (ref 22–32)
Calcium: 9 mg/dL (ref 8.9–10.3)
Chloride: 106 mmol/L (ref 98–111)
Creatinine: 0.81 mg/dL (ref 0.61–1.24)
GFR, Est AFR Am: >60 mL/min (ref >60)
GFR, Estimated: >60 mL/min (ref >60)
Glucose, Bld: 112 mg/dL — ABNORMAL HIGH (ref 70–99)
Potassium: 3.9 mmol/L (ref 3.5–5.1)
Sodium: 140 mmol/L (ref 135–145)
Total Bilirubin: 1.2 mg/dL (ref 0.3–1.2)
Total Protein: 6.3 g/dL — ABNORMAL LOW (ref 6.5–8.1)

## 2019-06-19 ENCOUNTER — Other Ambulatory Visit: Payer: Self-pay

## 2019-06-19 ENCOUNTER — Inpatient Hospital Stay: Payer: Medicare Other

## 2019-06-19 ENCOUNTER — Telehealth: Payer: Self-pay | Admitting: Hematology

## 2019-06-19 VITALS — BP 132/72 | HR 67 | Temp 98.4°F | Resp 20

## 2019-06-19 DIAGNOSIS — Z5112 Encounter for antineoplastic immunotherapy: Secondary | ICD-10-CM | POA: Diagnosis not present

## 2019-06-19 DIAGNOSIS — E119 Type 2 diabetes mellitus without complications: Secondary | ICD-10-CM | POA: Diagnosis not present

## 2019-06-19 DIAGNOSIS — Z7982 Long term (current) use of aspirin: Secondary | ICD-10-CM | POA: Diagnosis not present

## 2019-06-19 DIAGNOSIS — E785 Hyperlipidemia, unspecified: Secondary | ICD-10-CM | POA: Diagnosis not present

## 2019-06-19 DIAGNOSIS — C9000 Multiple myeloma not having achieved remission: Secondary | ICD-10-CM

## 2019-06-19 DIAGNOSIS — Z23 Encounter for immunization: Secondary | ICD-10-CM | POA: Diagnosis not present

## 2019-06-19 DIAGNOSIS — C9002 Multiple myeloma in relapse: Secondary | ICD-10-CM | POA: Diagnosis not present

## 2019-06-19 DIAGNOSIS — Z7189 Other specified counseling: Secondary | ICD-10-CM

## 2019-06-19 MED ORDER — METHYLPREDNISOLONE SODIUM SUCC 125 MG IJ SOLR
100.0000 mg | Freq: Once | INTRAMUSCULAR | Status: AC
  Administered 2019-06-19: 09:00:00 100 mg via INTRAVENOUS

## 2019-06-19 MED ORDER — METHYLPREDNISOLONE SODIUM SUCC 125 MG IJ SOLR
INTRAMUSCULAR | Status: AC
  Filled 2019-06-19: qty 2

## 2019-06-19 MED ORDER — DIPHENHYDRAMINE HCL 25 MG PO CAPS
ORAL_CAPSULE | ORAL | Status: AC
  Filled 2019-06-19: qty 2

## 2019-06-19 MED ORDER — MONTELUKAST SODIUM 10 MG PO TABS
ORAL_TABLET | ORAL | Status: AC
  Filled 2019-06-19: qty 1

## 2019-06-19 MED ORDER — DIPHENHYDRAMINE HCL 25 MG PO CAPS
50.0000 mg | ORAL_CAPSULE | Freq: Once | ORAL | Status: AC
  Administered 2019-06-19: 50 mg via ORAL

## 2019-06-19 MED ORDER — ZOLEDRONIC ACID 4 MG/100ML IV SOLN
4.0000 mg | Freq: Once | INTRAVENOUS | Status: AC
  Administered 2019-06-19: 4 mg via INTRAVENOUS
  Filled 2019-06-19: qty 100

## 2019-06-19 MED ORDER — PROCHLORPERAZINE MALEATE 10 MG PO TABS
10.0000 mg | ORAL_TABLET | Freq: Once | ORAL | Status: AC
  Administered 2019-06-19: 10 mg via ORAL

## 2019-06-19 MED ORDER — SODIUM CHLORIDE 0.9 % IV SOLN
Freq: Once | INTRAVENOUS | Status: AC
  Administered 2019-06-19: 09:00:00 via INTRAVENOUS
  Filled 2019-06-19: qty 250

## 2019-06-19 MED ORDER — DIPHENHYDRAMINE HCL 25 MG PO CAPS
ORAL_CAPSULE | ORAL | Status: AC
  Filled 2019-06-19: qty 1

## 2019-06-19 MED ORDER — SODIUM CHLORIDE 0.9 % IV SOLN
15.6000 mg/kg | Freq: Once | INTRAVENOUS | Status: AC
  Administered 2019-06-19: 1700 mg via INTRAVENOUS
  Filled 2019-06-19: qty 80

## 2019-06-19 MED ORDER — PROCHLORPERAZINE MALEATE 10 MG PO TABS
ORAL_TABLET | ORAL | Status: AC
  Filled 2019-06-19: qty 1

## 2019-06-19 MED ORDER — FAMOTIDINE IN NACL 20-0.9 MG/50ML-% IV SOLN
INTRAVENOUS | Status: AC
  Filled 2019-06-19: qty 50

## 2019-06-19 MED ORDER — FAMOTIDINE IN NACL 20-0.9 MG/50ML-% IV SOLN
20.0000 mg | Freq: Once | INTRAVENOUS | Status: AC
  Administered 2019-06-19: 09:00:00 20 mg via INTRAVENOUS

## 2019-06-19 MED ORDER — ACETAMINOPHEN 325 MG PO TABS
ORAL_TABLET | ORAL | Status: AC
  Filled 2019-06-19: qty 2

## 2019-06-19 MED ORDER — ACETAMINOPHEN 325 MG PO TABS
650.0000 mg | ORAL_TABLET | Freq: Once | ORAL | Status: AC
  Administered 2019-06-19: 650 mg via ORAL

## 2019-06-19 MED ORDER — DEXTROSE 5 % IV SOLN
55.0000 mg/m2 | Freq: Once | INTRAVENOUS | Status: AC
  Administered 2019-06-19: 130 mg via INTRAVENOUS
  Filled 2019-06-19: qty 60

## 2019-06-19 MED ORDER — MONTELUKAST SODIUM 10 MG PO TABS
10.0000 mg | ORAL_TABLET | Freq: Once | ORAL | Status: AC
  Administered 2019-06-19: 10 mg via ORAL

## 2019-06-19 NOTE — Telephone Encounter (Signed)
Scheduled appt per 9/1 los.  Patient will get a print at his appt today (9/2)

## 2019-07-03 ENCOUNTER — Inpatient Hospital Stay: Payer: Medicare Other

## 2019-07-03 ENCOUNTER — Other Ambulatory Visit: Payer: Self-pay

## 2019-07-03 VITALS — BP 132/56 | HR 77 | Temp 98.5°F | Resp 17

## 2019-07-03 DIAGNOSIS — C9000 Multiple myeloma not having achieved remission: Secondary | ICD-10-CM

## 2019-07-03 DIAGNOSIS — Z5112 Encounter for antineoplastic immunotherapy: Secondary | ICD-10-CM | POA: Diagnosis not present

## 2019-07-03 DIAGNOSIS — Z7982 Long term (current) use of aspirin: Secondary | ICD-10-CM | POA: Diagnosis not present

## 2019-07-03 DIAGNOSIS — E119 Type 2 diabetes mellitus without complications: Secondary | ICD-10-CM | POA: Diagnosis not present

## 2019-07-03 DIAGNOSIS — C9002 Multiple myeloma in relapse: Secondary | ICD-10-CM | POA: Diagnosis not present

## 2019-07-03 DIAGNOSIS — E785 Hyperlipidemia, unspecified: Secondary | ICD-10-CM | POA: Diagnosis not present

## 2019-07-03 DIAGNOSIS — Z7189 Other specified counseling: Secondary | ICD-10-CM

## 2019-07-03 DIAGNOSIS — Z23 Encounter for immunization: Secondary | ICD-10-CM | POA: Diagnosis not present

## 2019-07-03 LAB — CMP (CANCER CENTER ONLY)
ALT: 16 U/L (ref 0–44)
AST: 15 U/L (ref 15–41)
Albumin: 4.1 g/dL (ref 3.5–5.0)
Alkaline Phosphatase: 70 U/L (ref 38–126)
Anion gap: 9 (ref 5–15)
BUN: 11 mg/dL (ref 8–23)
CO2: 25 mmol/L (ref 22–32)
Calcium: 8.8 mg/dL — ABNORMAL LOW (ref 8.9–10.3)
Chloride: 107 mmol/L (ref 98–111)
Creatinine: 0.82 mg/dL (ref 0.61–1.24)
GFR, Est AFR Am: >60 mL/min (ref >60)
GFR, Estimated: >60 mL/min (ref >60)
Glucose, Bld: 164 mg/dL — ABNORMAL HIGH (ref 70–99)
Potassium: 3.7 mmol/L (ref 3.5–5.1)
Sodium: 141 mmol/L (ref 135–145)
Total Bilirubin: 1.1 mg/dL (ref 0.3–1.2)
Total Protein: 5.9 g/dL — ABNORMAL LOW (ref 6.5–8.1)

## 2019-07-03 LAB — CBC WITH DIFFERENTIAL/PLATELET
Abs Immature Granulocytes: 0.01 10*3/uL (ref 0.00–0.07)
Basophils Absolute: 0 10*3/uL (ref 0.0–0.1)
Basophils Relative: 1 %
Eosinophils Absolute: 0.1 10*3/uL (ref 0.0–0.5)
Eosinophils Relative: 3 %
HCT: 38.4 % — ABNORMAL LOW (ref 39.0–52.0)
Hemoglobin: 12.7 g/dL — ABNORMAL LOW (ref 13.0–17.0)
Immature Granulocytes: 0 %
Lymphocytes Relative: 13 %
Lymphs Abs: 0.5 10*3/uL — ABNORMAL LOW (ref 0.7–4.0)
MCH: 32.8 pg (ref 26.0–34.0)
MCHC: 33.1 g/dL (ref 30.0–36.0)
MCV: 99.2 fL (ref 80.0–100.0)
Monocytes Absolute: 0.4 10*3/uL (ref 0.1–1.0)
Monocytes Relative: 9 %
Neutro Abs: 3 10*3/uL (ref 1.7–7.7)
Neutrophils Relative %: 74 %
Platelets: 205 10*3/uL (ref 150–400)
RBC: 3.87 MIL/uL — ABNORMAL LOW (ref 4.22–5.81)
RDW: 13.1 % (ref 11.5–15.5)
WBC: 4.1 10*3/uL (ref 4.0–10.5)
nRBC: 0 % (ref 0.0–0.2)

## 2019-07-03 MED ORDER — SODIUM CHLORIDE 0.9 % IV SOLN
Freq: Once | INTRAVENOUS | Status: AC
  Administered 2019-07-03: 10:00:00 via INTRAVENOUS
  Filled 2019-07-03: qty 250

## 2019-07-03 MED ORDER — PROCHLORPERAZINE MALEATE 10 MG PO TABS
10.0000 mg | ORAL_TABLET | Freq: Once | ORAL | Status: AC
  Administered 2019-07-03: 10:00:00 10 mg via ORAL

## 2019-07-03 MED ORDER — MONTELUKAST SODIUM 10 MG PO TABS
10.0000 mg | ORAL_TABLET | Freq: Once | ORAL | Status: AC
  Administered 2019-07-03: 10 mg via ORAL

## 2019-07-03 MED ORDER — METHYLPREDNISOLONE SODIUM SUCC 125 MG IJ SOLR
INTRAMUSCULAR | Status: AC
  Filled 2019-07-03: qty 2

## 2019-07-03 MED ORDER — FAMOTIDINE IN NACL 20-0.9 MG/50ML-% IV SOLN
20.0000 mg | Freq: Once | INTRAVENOUS | Status: AC
  Administered 2019-07-03: 20 mg via INTRAVENOUS

## 2019-07-03 MED ORDER — METHYLPREDNISOLONE SODIUM SUCC 125 MG IJ SOLR
100.0000 mg | Freq: Once | INTRAMUSCULAR | Status: AC
  Administered 2019-07-03: 100 mg via INTRAVENOUS

## 2019-07-03 MED ORDER — ACETAMINOPHEN 325 MG PO TABS
ORAL_TABLET | ORAL | Status: AC
  Filled 2019-07-03: qty 2

## 2019-07-03 MED ORDER — FAMOTIDINE IN NACL 20-0.9 MG/50ML-% IV SOLN
INTRAVENOUS | Status: AC
  Filled 2019-07-03: qty 50

## 2019-07-03 MED ORDER — ACETAMINOPHEN 325 MG PO TABS
650.0000 mg | ORAL_TABLET | Freq: Once | ORAL | Status: AC
  Administered 2019-07-03: 10:00:00 650 mg via ORAL

## 2019-07-03 MED ORDER — DEXTROSE 5 % IV SOLN
55.0000 mg/m2 | Freq: Once | INTRAVENOUS | Status: AC
  Administered 2019-07-03: 130 mg via INTRAVENOUS
  Filled 2019-07-03: qty 60

## 2019-07-03 MED ORDER — DIPHENHYDRAMINE HCL 25 MG PO CAPS
ORAL_CAPSULE | ORAL | Status: AC
  Filled 2019-07-03: qty 2

## 2019-07-03 MED ORDER — SODIUM CHLORIDE 0.9 % IV SOLN
15.6000 mg/kg | Freq: Once | INTRAVENOUS | Status: AC
  Administered 2019-07-03: 1700 mg via INTRAVENOUS
  Filled 2019-07-03: qty 80

## 2019-07-03 MED ORDER — MONTELUKAST SODIUM 10 MG PO TABS
ORAL_TABLET | ORAL | Status: AC
  Filled 2019-07-03: qty 1

## 2019-07-03 MED ORDER — PROCHLORPERAZINE MALEATE 10 MG PO TABS
ORAL_TABLET | ORAL | Status: AC
  Filled 2019-07-03: qty 1

## 2019-07-03 MED ORDER — DIPHENHYDRAMINE HCL 25 MG PO CAPS
50.0000 mg | ORAL_CAPSULE | Freq: Once | ORAL | Status: AC
  Administered 2019-07-03: 10:00:00 50 mg via ORAL

## 2019-07-03 NOTE — Patient Instructions (Signed)
[facility] Discharge Instructions for Patients Receiving Chemotherapy  Today you received the following immunotherapy agents:  Kyprolis & Darzolex  To help prevent nausea and vomiting after your treatment, we encourage you to take your nausea medication as needed. If you develop nausea and vomiting that is not controlled by your nausea medication, call the clinic.   BELOW ARE SYMPTOMS THAT SHOULD BE REPORTED IMMEDIATELY:  *FEVER GREATER THAN 100.5 F  *CHILLS WITH OR WITHOUT FEVER  NAUSEA AND VOMITING THAT IS NOT CONTROLLED WITH YOUR NAUSEA MEDICATION  *UNUSUAL SHORTNESS OF BREATH  *UNUSUAL BRUISING OR BLEEDING  TENDERNESS IN MOUTH AND THROAT WITH OR WITHOUT PRESENCE OF ULCERS  *URINARY PROBLEMS  *BOWEL PROBLEMS  UNUSUAL RASH Items with * indicate a potential emergency and should be followed up as soon as possible.  Feel free to call the clinic should you have any questions or concerns. The clinic phone number is [phone].  Please show the [facility] at check-in to the Emergency Department and triage nurse.

## 2019-07-04 LAB — KAPPA/LAMBDA LIGHT CHAINS
Kappa free light chain: 5.7 mg/L (ref 3.3–19.4)
Kappa, lambda light chain ratio: >3.8 — ABNORMAL HIGH (ref 0.26–1.65)
Lambda free light chains: <1.5 mg/L — ABNORMAL LOW (ref 5.7–26.3)

## 2019-07-05 LAB — MULTIPLE MYELOMA PANEL, SERUM
Albumin SerPl Elph-Mcnc: 3.8 g/dL (ref 2.9–4.4)
Albumin/Glob SerPl: 1.8 — ABNORMAL HIGH (ref 0.7–1.7)
Alpha 1: 0.2 g/dL (ref 0.0–0.4)
Alpha2 Glob SerPl Elph-Mcnc: 0.7 g/dL (ref 0.4–1.0)
B-Globulin SerPl Elph-Mcnc: 0.8 g/dL (ref 0.7–1.3)
Gamma Glob SerPl Elph-Mcnc: 0.4 g/dL (ref 0.4–1.8)
Globulin, Total: 2.2 g/dL (ref 2.2–3.9)
IgA: 7 mg/dL — ABNORMAL LOW (ref 61–437)
IgG (Immunoglobin G), Serum: 474 mg/dL — ABNORMAL LOW (ref 603–1613)
IgM (Immunoglobulin M), Srm: 8 mg/dL — ABNORMAL LOW (ref 20–172)
M Protein SerPl Elph-Mcnc: 0.1 g/dL — ABNORMAL HIGH
Total Protein ELP: 6 g/dL (ref 6.0–8.5)

## 2019-07-15 NOTE — Progress Notes (Signed)
HEMATOLOGY/ONCOLOGY CLINIC NOTE  Date of Service: 07/16/19     Patient Care Team: [name], MD as PCP - General (Family Medicine) [name], MD as Consulting Physician (Hematology) [name], MD as Attending Physician (Radiation Oncology) [name], RN as Oncology Nurse Navigator (Oncology) [name] MD ([facility] _ primary oncology) phone number [phone] Email- [email or web address]  CHIEF COMPLAINTS  followup of multiple myeloma.  HISTORY OF PRESENTING ILLNESS:   Michael [name] is a wonderful 68 y.o. 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 Patient is currently on a study protocol and is being monitored for myeloma recurrence.  Based on available records being put together  -Patient was diagnosed with IgG kappa ISS stage I multiple myeloma with hyperdiploid complex cytogenetics in 2012. Bone marrow biopsy apparently showed 50% kappa restricted plasma cells with an initial M spike of 3.4 g/dL with evidence of lytic lesions on his PET/CT scan including right rib fracture and lesions of the lumbar and cervical spine. -Patient was treated under protocol 11-C-0221 with from 02/21/2013 with  Carfilzomib/Revlimid/Dexamethasone for a total of 8 cycles. He reports that his stem cells were collected and stored after 4 cycles -Posttreatment bone marrow biopsy showed less than 5% plasma cells with a negative flow cytometry and improvement in his previously PET avid lesions. Some concern for new focus of activity at C2 lytic lesion at L4 and several small lesions throughout the vertebrae. -Patient was on maintenance lenalidomide 10 mg by  mouth daily for 2 years or more until end of 2014. 08/19/2013 - bone marrow biopsy showed normocellular marrow with less than 5% plasma cells. PET CT scan showed decreased focal metabolic ligament prior rib lesions and no new lesions. Flow cytometry was negative for residual disease. Serum and urine electrophoresis and IFE showed no evidence of monoclonal protein. -Patient notes that he was off protocol for a few years due to lack of clinical trial research funding. -Patient notes that he is back on a follow-up protocol and continues to follow and receive his primary treatment at [facility] at this time. -Patient reports he had his last PET CT scan blood and urine tests and bone marrow examination in February 2018. The results of which are not available to [place] currently.  He notes that there was concern for a very slowly growing sternal FDG avid lesion that has been present for years. He has a follow-up at [facility] next month to determine the management of this lesion. He reports that was some consideration of considering radiation. he notes that this lesion has not been biopsied. No other focal bone pains at this time.  We discussed in detail about what our role will be and noted that we shall be available for any acute issues that need to be taken care of locally but that at this time the primary treatment and evaluation will be driven by his team at [facility] as per their research protocols and input. The patient and his wife are clearly aware of this and are in agreement with this plan.  INTERVAL HISTORY  Michael [name] comes is here for continued management of his Multiple Myeloma. He is here before his C10D1 Daratumumab, Dexamethasone, and 6m/m2 Carfilzomib.  The patient's last visit with [place] was on 06/18/2019. The pt reports that he is doing well overall.  The pt reports some back pain that he thinks could be related to his sleeping conditions. He will try a new mattress to see if that alleviates  some of the pain. Pt reports that he used to get nauseous with the double dose of Carfilzomib. He will see his Dermatologist in October to treat the skin cancer on his head with a topical treatment. Pt does not take his Oxycodone often, when his back begins to hurt he typically takes an Advil. He notes that his weight has been steady, he has been getting calcium in his diet and he has been trying to take in an adequate amount of water. Pt occasionally lifts things that are heavy. He has not gotten his seasonal flu shot and is also interested in the Shignrix.   Lab results today (07/16/19) of CBC w/diff and CMP is as follows: all values are WNL except for RBC at 3.81, Hgb at 12.7, HCT at 38.1, Lymphs Abs at 0.5K, Glucose at 110, Total Protein at 6.2, AST at 14.  On review of systems, pt reports back pain and denies unexpected weight loss and any other symptoms.    MEDICAL HISTORY:  Past Medical History:  Diagnosis Date  . Angioedema   . CPAP (continuous positive airway pressure) dependence    uses at night  . Gastroesophageal reflux disease without esophagitis   . History of radiation therapy 06/04/18- 06/15/18   25 Gy in 10 fractions to the laryngeal/ cartilage lesion  . Hyperlipidemia   . Multiple myeloma ([place]) 2012   Treated at [facility]  . Non morbid obesity due to excess calories   . Prediabetes   . Prediabetes   . Unspecified glaucoma(365.9)   . Urticaria, unspecified   Diabetes mellitus Glaucoma Left toes neuropathy  GERD  SURGICAL HISTORY: Past Surgical History:  Procedure Laterality Date  . IR RADIOLOGIST EVAL & MGMT  11/13/2018  . PORTACATH PLACEMENT    . PORTACATH PLACEMENT     removed 02/2016    SOCIAL HISTORY: Social History   Socioeconomic History  . Marital status: Married    Spouse name: Not on file  . Number of children: 2  . Years of education: Not on file  . Highest education level: Not on file  Occupational History  . Occupation: Retired Chief Strategy Officer for [employer]  . Financial resource strain: Not on file  . Food insecurity    Worry: Not on file    Inability: Not on file  . Transportation needs    Medical: Not on file    Non-medical: Not on file  Tobacco Use  . Smoking status: Never Smoker  . Smokeless tobacco: Never Used  Substance and Sexual Activity  . Alcohol use: Yes    Alcohol/week: 0.0 standard drinks    Comment: Occasional beer  . Drug use: No  . Sexual activity: Not on file  Lifestyle  . Physical activity    Days per week: Not on file    Minutes per session: Not on file  . Stress: Not on file  Relationships  . Social Herbalist on phone: Not on file    Gets together: Not on file    Attends religious service: Not on file    Active member of club or organization: Not on file    Attends meetings of clubs or organizations: Not on file  Relationship status: Not on file  . Intimate partner violence    Fear of current or ex partner: No    Emotionally abused: No    Physically abused: No    Forced sexual activity: No  Other Topics Concern  . Not on file  Social History Narrative   Unable to [employer] partner violence- partner in room   Never smoker Married Retired from the [employer] in June 2017 and moved to [facility].  FAMILY HISTORY: Family History  Problem Relation Age of Onset  . Cancer Father     ALLERGIES:  has No Known Allergies.  MEDICATIONS:  Current Outpatient Medications  Medication Sig Dispense Refill  . acyclovir (ZOVIRAX) 400 MG tablet Take 1 tablet (400 mg total) by mouth 2 (two) times daily. 60 tablet 11  . aspirin 81 MG tablet Take 81 mg by mouth daily.    [name] atorvastatin (LIPITOR) 20 MG tablet Take 20 mg by mouth every evening.     [name] dexamethasone (DECADRON) 4 MG tablet 55m (5 tabs) with breakfast the day after each daratumumab treatment 20 tablet 4  . Multiple Vitamin (MULTIVITAMIN) tablet Take 1 tablet by mouth daily.    . ondansetron (ZOFRAN)  8 MG tablet Take 1 tablet (8 mg total) by mouth 2 (two) times daily as needed (Nausea or vomiting). 30 tablet 1  . oxyCODONE (OXY IR/ROXICODONE) 5 MG immediate release tablet Take 1-2 tablets (5-10 mg total) by mouth every 4 (four) hours as needed for severe pain or breakthrough pain. (Patient taking differently: Take 5-10 mg by mouth every 4 (four) hours as needed for severe pain or breakthrough pain. Patient reports taking only as needed - rarely) 60 tablet 0  . pantoprazole (PROTONIX) 40 MG tablet Take 40 mg by mouth daily.     . prochlorperazine (COMPAZINE) 10 MG tablet Take 1 tablet (10 mg total) by mouth every 6 (six) hours as needed (Nausea or vomiting). 30 tablet 1  . senna-docusate (SENOKOT-S) 8.6-50 MG tablet Take 2 tablets by mouth at bedtime. Takes 50 mg 60 tablet 3   No current facility-administered medications for this visit.     REVIEW OF SYSTEMS:    A 10+ POINT REVIEW OF SYSTEMS WAS OBTAINED including neurology, dermatology, psychiatry, cardiac, respiratory, lymph, extremities, GI, GU, Musculoskeletal, constitutional, breasts, reproductive, HEENT.  All pertinent positives are noted in the HPI.  All others are negative.   PHYSICAL EXAMINATION: ECOG PERFORMANCE STATUS: 1 - Symptomatic but completely ambulatory  Vitals:   07/16/19 0901  BP: (!) 141/80  Pulse: 68  Resp: 18  Temp: 98.7 F (37.1 C)  TempSrc: Temporal  SpO2: 100%  Weight: 233 lb 4.8 oz (105.8 kg)  Height: 6' 1"  (1.854 m)   Body mass index is 30.78 kg/m.     GENERAL:alert, in no acute distress and comfortable SKIN: no acute rashes, no significant lesions EYES: conjunctiva are pink and non-injected, sclera anicteric OROPHARYNX: MMM, no exudates, no oropharyngeal erythema or ulceration NECK: supple, no JVD LYMPH:  no palpable lymphadenopathy in the cervical, axillary or inguinal regions LUNGS: clear to auscultation b/l with normal respiratory effort HEART: regular rate & rhythm ABDOMEN:  normoactive  bowel sounds , non tender, not distended. No palpable hepatosplenomegaly.  Extremity: no pedal edema PSYCH: alert & oriented x 3 with fluent speech NEURO: no focal motor/sensory deficits   LABORATORY DATA:  I have reviewed the data as listed . CBC Latest Ref Rng & Units 07/16/2019 07/03/2019 06/18/2019  WBC 4.0 -  10.5 K/uL 4.3 4.1 4.3  Hemoglobin 13.0 - 17.0 g/dL 12.7(L) 12.7(L) 13.0  Hematocrit 39.0 - 52.0 % 38.1(L) 38.4(L) 39.6  Platelets 150 - 400 K/uL 201 205 189    . CMP Latest Ref Rng & Units 07/16/2019 07/03/2019 06/18/2019  Glucose 70 - 99 mg/dL 110(H) 164(H) 112(H)  BUN 8 - 23 mg/dL 16 11 18   Creatinine 0.61 - 1.24 mg/dL 0.83 0.82 0.81  Sodium 135 - 145 mmol/L 142 141 140  Potassium 3.5 - 5.1 mmol/L 4.7 3.7 3.9  Chloride 98 - 111 mmol/L 107 107 106  CO2 22 - 32 mmol/L 29 25 26   Calcium 8.9 - 10.3 mg/dL 9.2 8.8(L) 9.0  Total Protein 6.5 - 8.1 g/dL 6.2(L) 5.9(L) 6.3(L)  Total Bilirubin 0.3 - 1.2 mg/dL 1.0 1.1 1.2  Alkaline Phos 38 - 126 U/L 73 70 72  AST 15 - 41 U/L 14(L) 15 15  ALT 0 - 44 U/L 16 16 17      05/08/18 Left Neck Thyroid Cartilage Biopsy:        RADIOGRAPHIC STUDIES: I have personally reviewed the radiological images as listed and agreed with the findings in the report. No results found.  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 Bone marrow biopsy apparently showed 50% kappa restricted plasma cells with an initial M spike of 3.4 g/dL with evidence of lytic lesions on his PET/CT scan including right rib fracture and lesions of the lumbar and cervical spine. -Patient was treated under protocol 11-C-0221 with from 02/21/2013 with  Carfilzomib/Revlimid/Dexamethasone for a total of 8 cycles. He reports that his stem cells were collected and stored after 4 cycles -Posttreatment bone marrow biopsy  showed less than 5% plasma cells with a negative flow cytometry and improvement in his previously PET avid lesions. Some concern for new focus of activity at C2 lytic lesion at L4 and several small lesions throughout the vertebrae. -Patient was on maintenance lenalidomide 10 mg by mouth daily for 2 years or more until end of 2014. 08/19/2013 - bone marrow biopsy showed normocellular marrow with less than 5% plasma cells. PET CT scan showed decreased focal metabolic ligament prior rib lesions and no new lesions. Flow cytometry was negative for residual disease. Serum and urine electrophoresis and IFE showed no evidence of monoclonal protein. -Patient notes that he was off protocol for a few years due to lack of clinical trial research funding. -Patient returned to follow-up protocol and began to follow and receive his primary treatment at [facility]  -Patient's labs show minimal IgG kappa protein on IFE and a CT chest in May 2018 that did not show any overt signs of myeloma progression in the bones.  Rpt Myeloma labs 03/2017 - M spike of 0.3g/dl with a repeat M spike stable at 0.3 g/dL on 05/23/2017 which remains stable on labs today 07/25/17 Serum kappa lambda free light chain ratio not significantly changed. PET/CT scan showed a single metabolically active sternal lesion without any overt bony destruction. Bone marrow biopsy done on 06/06/2017 - shows no overt involvement with multiple myeloma.  04/24/18 CT Soft Tissue Neck revealed Left laryngeal 3 cm soft tissue mass appears to have originated within and is expanding the left thyroid cartilage. This is new since the August 2018 PET-CT, and there is absent lymphadenopathy. Consider Multiple Myeloma of the laryngeal cartilage in this  clinical setting. Primary cartilaginous neoplasm, squamous cell carcinoma, and other differential considerations are less likely. Superimposed widespread multiple myeloma lesions in the visible skeleton.  05/08/18 Left neck thyroid  cartilage biopsy revealed Plasma cell neoplasm.  05/22/18 PET/CT revealed Soft tissue mass centered around the destroyed left thyroid cartilage is hypermetabolic and consistent with known plasmacytoma. No adenopathy in the neck. 2. Hypermetabolic 3 cm cutaneous and subcutaneous soft tissue mass involving the posterior upper thorax suspicious for cutaneous manifestation of myeloma (plasmacytoma). 3. Persistent and slightly progressive hypermetabolic sternal lesion. 4. Diffuse lytic myelomatous lesions throughout the bony structures but no other hypermetabolic foci. 5. Focus of hypermetabolism in the left aspect of the lower prostate gland, not present on the prior study. This could be an area of infection/inflammation or potential prostate cancer. Recommend correlation with physical examination and PSA level.    Completed RT between 06/04/18 and 06/15/18 of 25 Gy by 10 fractions  The pt pursued a clinical trial at the [facility] with RT and immunotherapy in October 2019 through October 18, 2018 with further information available at [email or web address]  10/18/18 M Protein increased to 3.5g, from 0.7g at our last visit on 07/10/18. 10/02/18 PET/CT from [facility] indicated new hypermetabolism in left clavicle, left scapula, bilateral ribs, spine, and left proximal femur 10/18/18 MRI from [facility] ndicated a pathologic fracture at T12  10/2018 M Protein at 4.1g  11/23/18 NM Bone revealed Patchy uptake throughout the ribs and spine with areas of focally increased activity in both T12 pedicles and the right L1 pedicle. This focal activity could be stress related without clear corresponding fracture on available prior studies.  2) h/o Elevated bilirubin level . ? revlmid vs Gilberts  3) PET/CT abnormality in the Prostate. PSA levels WNL -Primary care physician to consider urology referral  4) Abnormal focus of FDG avid at a spot in the liver - will need to be monitor on f/u scans. No pain or other symptoms  at this time.  PLAN:  -Discussed pt labwork today, 07/16/19; all values are WNL except for RBC at 3.81, Hgb at 12.7, HCT at 38.1, Lymphs Abs at 0.5K, Glucose at 110, Total Protein at 6.2, AST at 14. -Discussed 07/03/2019 K/L light chains is as follows: Kappa free light chain at 5.7, Lamda free light chains at <1.5, K/L light chain ratio at >3.80 -Discussed 07/03/2019 MMP is as follows: all values are WNL except for IgG at 474, IgA at 7, IgM at 8, M Protein at 0.1,  Albumin/Glob at 1.8, IFE 1 shows "Immunofixation shows IgG monoclonal protein with kappa light chain specificity." -Advised pt to get Shignrix with PCP after we complete treatment for best response -Discuss that when treatment response plateaus, will switch to maintenance treatment. The pt is not interested in a transplant at this time because of covid-19.  -The pt has no prohibitive toxicities from continuing C10D1 Daratumumab, Dexamethasone, and 29m/m2 Carfilzomib at this time. -Continue Zometa every 4 weeks -Continue Acyclovir -Will consider removing Carflizomib after a few more cycles -Will schedule 2 more cycles to get the deepest response  -Recommended a higher dosage, 1x per week Vitamin D supplement -Will give flu shot with next treatment  -Refill Dexamethasone  FOLLOW UP: Please schedule next 2 cycles of treatment as per orders with labs MD visit with D1 of each cycle.    The total time spent in the appt was 25 minutes and more than 50% was on counseling and direct patient cares.  All of the patient's questions were answered  with apparent satisfaction. The patient knows to call the clinic with any problems, questions or concerns.    Sullivan Lone MD [place] AAHIVMS [facility] Hematology/Oncology Physician [facility]  (Office):       [phone] (Work cell):  [phone] (Fax):           [phone]  I, Yevette Edwards, am acting as a scribe for Dr. Sullivan Lone.   .I have reviewed the above  documentation for accuracy and completeness, and I agree with the above. [name] MD

## 2019-07-16 ENCOUNTER — Inpatient Hospital Stay: Payer: Medicare Other

## 2019-07-16 ENCOUNTER — Telehealth: Payer: Self-pay | Admitting: Hematology

## 2019-07-16 ENCOUNTER — Inpatient Hospital Stay (HOSPITAL_BASED_OUTPATIENT_CLINIC_OR_DEPARTMENT_OTHER): Payer: Medicare Other | Admitting: Hematology

## 2019-07-16 ENCOUNTER — Other Ambulatory Visit: Payer: Self-pay

## 2019-07-16 VITALS — BP 141/80 | HR 68 | Temp 98.7°F | Resp 18 | Ht 73.0 in | Wt 233.3 lb

## 2019-07-16 DIAGNOSIS — Z7189 Other specified counseling: Secondary | ICD-10-CM

## 2019-07-16 DIAGNOSIS — Z5112 Encounter for antineoplastic immunotherapy: Secondary | ICD-10-CM

## 2019-07-16 DIAGNOSIS — Z7982 Long term (current) use of aspirin: Secondary | ICD-10-CM | POA: Diagnosis not present

## 2019-07-16 DIAGNOSIS — Z23 Encounter for immunization: Secondary | ICD-10-CM | POA: Diagnosis not present

## 2019-07-16 DIAGNOSIS — C9000 Multiple myeloma not having achieved remission: Secondary | ICD-10-CM | POA: Diagnosis not present

## 2019-07-16 DIAGNOSIS — C9002 Multiple myeloma in relapse: Secondary | ICD-10-CM | POA: Diagnosis not present

## 2019-07-16 DIAGNOSIS — E119 Type 2 diabetes mellitus without complications: Secondary | ICD-10-CM | POA: Diagnosis not present

## 2019-07-16 DIAGNOSIS — E785 Hyperlipidemia, unspecified: Secondary | ICD-10-CM | POA: Diagnosis not present

## 2019-07-16 LAB — CMP (CANCER CENTER ONLY)
ALT: 16 U/L (ref 0–44)
AST: 14 U/L — ABNORMAL LOW (ref 15–41)
Albumin: 4.2 g/dL (ref 3.5–5.0)
Alkaline Phosphatase: 73 U/L (ref 38–126)
Anion gap: 6 (ref 5–15)
BUN: 16 mg/dL (ref 8–23)
CO2: 29 mmol/L (ref 22–32)
Calcium: 9.2 mg/dL (ref 8.9–10.3)
Chloride: 107 mmol/L (ref 98–111)
Creatinine: 0.83 mg/dL (ref 0.61–1.24)
GFR, Est AFR Am: >60 mL/min (ref >60)
GFR, Estimated: >60 mL/min (ref >60)
Glucose, Bld: 110 mg/dL — ABNORMAL HIGH (ref 70–99)
Potassium: 4.7 mmol/L (ref 3.5–5.1)
Sodium: 142 mmol/L (ref 135–145)
Total Bilirubin: 1 mg/dL (ref 0.3–1.2)
Total Protein: 6.2 g/dL — ABNORMAL LOW (ref 6.5–8.1)

## 2019-07-16 LAB — CBC WITH DIFFERENTIAL/PLATELET
Abs Immature Granulocytes: 0.02 10*3/uL (ref 0.00–0.07)
Basophils Absolute: 0 10*3/uL (ref 0.0–0.1)
Basophils Relative: 1 %
Eosinophils Absolute: 0.1 10*3/uL (ref 0.0–0.5)
Eosinophils Relative: 2 %
HCT: 38.1 % — ABNORMAL LOW (ref 39.0–52.0)
Hemoglobin: 12.7 g/dL — ABNORMAL LOW (ref 13.0–17.0)
Immature Granulocytes: 1 %
Lymphocytes Relative: 12 %
Lymphs Abs: 0.5 10*3/uL — ABNORMAL LOW (ref 0.7–4.0)
MCH: 33.3 pg (ref 26.0–34.0)
MCHC: 33.3 g/dL (ref 30.0–36.0)
MCV: 100 fL (ref 80.0–100.0)
Monocytes Absolute: 0.5 10*3/uL (ref 0.1–1.0)
Monocytes Relative: 12 %
Neutro Abs: 3.1 10*3/uL (ref 1.7–7.7)
Neutrophils Relative %: 72 %
Platelets: 201 10*3/uL (ref 150–400)
RBC: 3.81 MIL/uL — ABNORMAL LOW (ref 4.22–5.81)
RDW: 13 % (ref 11.5–15.5)
WBC: 4.3 10*3/uL (ref 4.0–10.5)
nRBC: 0 % (ref 0.0–0.2)

## 2019-07-16 MED ORDER — DEXAMETHASONE 4 MG PO TABS: ORAL_TABLET | ORAL | 4 refills | Status: DC

## 2019-07-16 NOTE — Telephone Encounter (Signed)
Scheduled appt per 9/29 los.

## 2019-07-17 ENCOUNTER — Inpatient Hospital Stay: Payer: Medicare Other

## 2019-07-17 ENCOUNTER — Other Ambulatory Visit: Payer: Self-pay

## 2019-07-17 VITALS — BP 129/70 | HR 63 | Temp 98.2°F | Resp 18

## 2019-07-17 DIAGNOSIS — C9000 Multiple myeloma not having achieved remission: Secondary | ICD-10-CM

## 2019-07-17 DIAGNOSIS — Z23 Encounter for immunization: Secondary | ICD-10-CM | POA: Diagnosis not present

## 2019-07-17 DIAGNOSIS — Z7189 Other specified counseling: Secondary | ICD-10-CM

## 2019-07-17 DIAGNOSIS — E785 Hyperlipidemia, unspecified: Secondary | ICD-10-CM | POA: Diagnosis not present

## 2019-07-17 DIAGNOSIS — C9002 Multiple myeloma in relapse: Secondary | ICD-10-CM

## 2019-07-17 DIAGNOSIS — Z7982 Long term (current) use of aspirin: Secondary | ICD-10-CM | POA: Diagnosis not present

## 2019-07-17 DIAGNOSIS — Z5112 Encounter for antineoplastic immunotherapy: Secondary | ICD-10-CM | POA: Diagnosis not present

## 2019-07-17 DIAGNOSIS — E119 Type 2 diabetes mellitus without complications: Secondary | ICD-10-CM | POA: Diagnosis not present

## 2019-07-17 MED ORDER — METHYLPREDNISOLONE SODIUM SUCC 125 MG IJ SOLR
100.0000 mg | Freq: Once | INTRAMUSCULAR | Status: AC
  Administered 2019-07-17: 100 mg via INTRAVENOUS

## 2019-07-17 MED ORDER — FAMOTIDINE IN NACL 20-0.9 MG/50ML-% IV SOLN
20.0000 mg | Freq: Once | INTRAVENOUS | Status: AC
  Administered 2019-07-17: 20 mg via INTRAVENOUS

## 2019-07-17 MED ORDER — DEXTROSE 5 % IV SOLN
55.0000 mg/m2 | Freq: Once | INTRAVENOUS | Status: AC
  Administered 2019-07-17: 130 mg via INTRAVENOUS
  Filled 2019-07-17: qty 5

## 2019-07-17 MED ORDER — ZOLEDRONIC ACID 4 MG/100ML IV SOLN
4.0000 mg | Freq: Once | INTRAVENOUS | Status: AC
  Administered 2019-07-17: 10:00:00 4 mg via INTRAVENOUS
  Filled 2019-07-17: qty 100

## 2019-07-17 MED ORDER — INFLUENZA VAC A&B SA ADJ QUAD 0.5 ML IM PRSY
PREFILLED_SYRINGE | INTRAMUSCULAR | Status: AC
  Filled 2019-07-17: qty 0.5

## 2019-07-17 MED ORDER — PROCHLORPERAZINE MALEATE 10 MG PO TABS
ORAL_TABLET | ORAL | Status: AC
  Filled 2019-07-17: qty 1

## 2019-07-17 MED ORDER — SODIUM CHLORIDE 0.9 % IV SOLN
Freq: Once | INTRAVENOUS | Status: AC
  Administered 2019-07-17: 09:00:00 via INTRAVENOUS
  Filled 2019-07-17: qty 250

## 2019-07-17 MED ORDER — MONTELUKAST SODIUM 10 MG PO TABS
10.0000 mg | ORAL_TABLET | Freq: Once | ORAL | Status: AC
  Administered 2019-07-17: 10 mg via ORAL

## 2019-07-17 MED ORDER — DIPHENHYDRAMINE HCL 25 MG PO CAPS
ORAL_CAPSULE | ORAL | Status: AC
  Filled 2019-07-17: qty 2

## 2019-07-17 MED ORDER — ACETAMINOPHEN 325 MG PO TABS
650.0000 mg | ORAL_TABLET | Freq: Once | ORAL | Status: AC
  Administered 2019-07-17: 650 mg via ORAL

## 2019-07-17 MED ORDER — INFLUENZA VAC A&B SA ADJ QUAD 0.5 ML IM PRSY
0.5000 mL | PREFILLED_SYRINGE | Freq: Once | INTRAMUSCULAR | Status: AC
  Administered 2019-07-17: 0.5 mL via INTRAMUSCULAR

## 2019-07-17 MED ORDER — FAMOTIDINE IN NACL 20-0.9 MG/50ML-% IV SOLN
INTRAVENOUS | Status: AC
  Filled 2019-07-17: qty 50

## 2019-07-17 MED ORDER — MONTELUKAST SODIUM 10 MG PO TABS
ORAL_TABLET | ORAL | Status: AC
  Filled 2019-07-17: qty 1

## 2019-07-17 MED ORDER — SODIUM CHLORIDE 0.9 % IV SOLN
15.6000 mg/kg | Freq: Once | INTRAVENOUS | Status: AC
  Administered 2019-07-17: 1700 mg via INTRAVENOUS
  Filled 2019-07-17: qty 5

## 2019-07-17 MED ORDER — PROCHLORPERAZINE MALEATE 10 MG PO TABS
10.0000 mg | ORAL_TABLET | Freq: Once | ORAL | Status: AC
  Administered 2019-07-17: 10 mg via ORAL

## 2019-07-17 MED ORDER — METHYLPREDNISOLONE SODIUM SUCC 125 MG IJ SOLR
INTRAMUSCULAR | Status: AC
  Filled 2019-07-17: qty 2

## 2019-07-17 MED ORDER — ACETAMINOPHEN 325 MG PO TABS
ORAL_TABLET | ORAL | Status: AC
  Filled 2019-07-17: qty 2

## 2019-07-17 MED ORDER — DIPHENHYDRAMINE HCL 25 MG PO CAPS
50.0000 mg | ORAL_CAPSULE | Freq: Once | ORAL | Status: AC
  Administered 2019-07-17: 50 mg via ORAL

## 2019-07-17 NOTE — Patient Instructions (Signed)
[facility] Discharge Instructions for Patients Receiving Chemotherapy  Today you received the following Immunotherapy agents: Kyprolis, Darzalex and other agent: Zometa  To help prevent nausea and vomiting after your treatment, we encourage you to take your nausea medication as directed by your MD.   If you develop nausea and vomiting that is not controlled by your nausea medication, call the clinic.   BELOW ARE SYMPTOMS THAT SHOULD BE REPORTED IMMEDIATELY:  *FEVER GREATER THAN 100.5 F  *CHILLS WITH OR WITHOUT FEVER  NAUSEA AND VOMITING THAT IS NOT CONTROLLED WITH YOUR NAUSEA MEDICATION  *UNUSUAL SHORTNESS OF BREATH  *UNUSUAL BRUISING OR BLEEDING  TENDERNESS IN MOUTH AND THROAT WITH OR WITHOUT PRESENCE OF ULCERS  *URINARY PROBLEMS  *BOWEL PROBLEMS  UNUSUAL RASH Items with * indicate a potential emergency and should be followed up as soon as possible.  Feel free to call the clinic should you have any questions or concerns. The clinic phone number is [phone].  Please show the CHEMO ALERT CARD at check-in to the Emergency Department and triage nurse. Coronavirus (COVID-19) Are you at risk?  Are you at risk for the Coronavirus (COVID-19)?  To be considered HIGH RISK for Coronavirus (COVID-19), you have to meet the following criteria:  . Traveled to [place], [place], [place], [place] or [place]; or in the [place] to [place], [place], [place], or [place]; and have fever, cough, and shortness of breath within the last 2 weeks of travel OR . Been in close contact with a person diagnosed with COVID-19 within the last 2 weeks and have fever, cough, and shortness of breath . IF YOU DO NOT MEET THESE CRITERIA, YOU ARE CONSIDERED LOW RISK FOR COVID-19.  What to do if you are HIGH RISK for COVID-19?  . If you are having a medical emergency, call 911. . Seek medical care right away. Before you go to a doctor's office, urgent care or emergency  department, call ahead and tell them about your recent travel, contact with someone diagnosed with COVID-19, and your symptoms. You should receive instructions from your physician's office regarding next steps of care.  . When you arrive at healthcare provider, tell the healthcare staff immediately you have returned from visiting [place], [place], [place], [place] or [place]; or traveled in the [place] to [place], [place], [place], or [place]; in the last two weeks or you have been in close contact with a person diagnosed with COVID-19 in the last 2 weeks.   . Tell the health care staff about your symptoms: fever, cough and shortness of breath. . After you have been seen by a medical provider, you will be either: o Tested for (COVID-19) and discharged home on quarantine except to seek medical care if symptoms worsen, and asked to  - Stay home and avoid contact with others until you get your results (4-5 days)  - Avoid travel on public transportation if possible (such as bus, train, or airplane) or o Sent to the Emergency Department by EMS for evaluation, COVID-19 testing, and possible admission depending on your condition and test results.  What to do if you are LOW RISK for COVID-19?  Reduce your risk of any infection by using the same precautions used for avoiding the common cold or flu:  . Wash your hands often with soap and warm water for at least 20 seconds.  If soap and water are not readily available, use an alcohol-based hand sanitizer with at least 60%   alcohol.  . If coughing or sneezing, cover your mouth and nose by coughing or sneezing into the elbow areas of your shirt or coat, into a tissue or into your sleeve (not your hands). . Avoid shaking hands with others and consider head nods or verbal greetings only. . Avoid touching your eyes, nose, or mouth with unwashed hands.  . Avoid close contact with people who are sick. . Avoid places or events with large numbers of people  in one location, like concerts or sporting events. . Carefully consider travel plans you have or are making. . If you are planning any travel outside or inside the [place], visit the CDC's Travelers' Health webpage for the latest health notices. . If you have some symptoms but not all symptoms, continue to monitor at home and seek medical attention if your symptoms worsen. . If you are having a medical emergency, call 911.   ADDITIONAL HEALTHCARE OPTIONS FOR PATIENTS  [facility] / e-Visit: [email or web address]         [facility]: [phone]  [facility]: [phone]                   [facility]: [phone]    

## 2019-07-18 DIAGNOSIS — S61412A Laceration without foreign body of left hand, initial encounter: Secondary | ICD-10-CM | POA: Diagnosis not present

## 2019-07-31 ENCOUNTER — Inpatient Hospital Stay: Payer: Medicare Other

## 2019-07-31 ENCOUNTER — Other Ambulatory Visit: Payer: Self-pay

## 2019-07-31 ENCOUNTER — Inpatient Hospital Stay: Payer: Medicare Other | Attending: Hematology

## 2019-07-31 VITALS — BP 116/69 | HR 66 | Temp 98.1°F | Resp 17 | Ht 73.0 in | Wt 234.0 lb

## 2019-07-31 DIAGNOSIS — E785 Hyperlipidemia, unspecified: Secondary | ICD-10-CM | POA: Insufficient documentation

## 2019-07-31 DIAGNOSIS — E119 Type 2 diabetes mellitus without complications: Secondary | ICD-10-CM | POA: Diagnosis not present

## 2019-07-31 DIAGNOSIS — Z79899 Other long term (current) drug therapy: Secondary | ICD-10-CM | POA: Insufficient documentation

## 2019-07-31 DIAGNOSIS — Z7982 Long term (current) use of aspirin: Secondary | ICD-10-CM | POA: Insufficient documentation

## 2019-07-31 DIAGNOSIS — C9000 Multiple myeloma not having achieved remission: Secondary | ICD-10-CM

## 2019-07-31 DIAGNOSIS — C9002 Multiple myeloma in relapse: Secondary | ICD-10-CM | POA: Insufficient documentation

## 2019-07-31 DIAGNOSIS — Z5112 Encounter for antineoplastic immunotherapy: Secondary | ICD-10-CM | POA: Diagnosis not present

## 2019-07-31 DIAGNOSIS — Z7189 Other specified counseling: Secondary | ICD-10-CM

## 2019-07-31 LAB — CMP (CANCER CENTER ONLY)
ALT: 15 U/L (ref 0–44)
AST: 13 U/L — ABNORMAL LOW (ref 15–41)
Albumin: 4 g/dL (ref 3.5–5.0)
Alkaline Phosphatase: 77 U/L (ref 38–126)
Anion gap: 12 (ref 5–15)
BUN: 10 mg/dL (ref 8–23)
CO2: 24 mmol/L (ref 22–32)
Calcium: 9 mg/dL (ref 8.9–10.3)
Chloride: 106 mmol/L (ref 98–111)
Creatinine: 0.83 mg/dL (ref 0.61–1.24)
GFR, Est AFR Am: >60 mL/min (ref >60)
GFR, Estimated: >60 mL/min (ref >60)
Glucose, Bld: 119 mg/dL — ABNORMAL HIGH (ref 70–99)
Potassium: 3.7 mmol/L (ref 3.5–5.1)
Sodium: 142 mmol/L (ref 135–145)
Total Bilirubin: 0.8 mg/dL (ref 0.3–1.2)
Total Protein: 6.5 g/dL (ref 6.5–8.1)

## 2019-07-31 LAB — CBC WITH DIFFERENTIAL/PLATELET
Abs Immature Granulocytes: 0.04 10*3/uL (ref 0.00–0.07)
Basophils Absolute: 0 10*3/uL (ref 0.0–0.1)
Basophils Relative: 1 %
Eosinophils Absolute: 0.2 10*3/uL (ref 0.0–0.5)
Eosinophils Relative: 3 %
HCT: 38.6 % — ABNORMAL LOW (ref 39.0–52.0)
Hemoglobin: 13.1 g/dL (ref 13.0–17.0)
Immature Granulocytes: 1 %
Lymphocytes Relative: 11 %
Lymphs Abs: 0.6 10*3/uL — ABNORMAL LOW (ref 0.7–4.0)
MCH: 33.2 pg (ref 26.0–34.0)
MCHC: 33.9 g/dL (ref 30.0–36.0)
MCV: 97.7 fL (ref 80.0–100.0)
Monocytes Absolute: 0.4 10*3/uL (ref 0.1–1.0)
Monocytes Relative: 8 %
Neutro Abs: 4.3 10*3/uL (ref 1.7–7.7)
Neutrophils Relative %: 76 %
Platelets: 215 10*3/uL (ref 150–400)
RBC: 3.95 MIL/uL — ABNORMAL LOW (ref 4.22–5.81)
RDW: 12.9 % (ref 11.5–15.5)
WBC: 5.6 10*3/uL (ref 4.0–10.5)
nRBC: 0 % (ref 0.0–0.2)

## 2019-07-31 MED ORDER — FAMOTIDINE IN NACL 20-0.9 MG/50ML-% IV SOLN
INTRAVENOUS | Status: AC
  Filled 2019-07-31: qty 50

## 2019-07-31 MED ORDER — MONTELUKAST SODIUM 10 MG PO TABS
10.0000 mg | ORAL_TABLET | Freq: Once | ORAL | Status: AC
  Administered 2019-07-31: 10:00:00 10 mg via ORAL

## 2019-07-31 MED ORDER — ACETAMINOPHEN 325 MG PO TABS
650.0000 mg | ORAL_TABLET | Freq: Once | ORAL | Status: AC
  Administered 2019-07-31: 10:00:00 650 mg via ORAL

## 2019-07-31 MED ORDER — DIPHENHYDRAMINE HCL 25 MG PO CAPS
50.0000 mg | ORAL_CAPSULE | Freq: Once | ORAL | Status: AC
  Administered 2019-07-31: 50 mg via ORAL

## 2019-07-31 MED ORDER — SODIUM CHLORIDE 0.9 % IV SOLN
Freq: Once | INTRAVENOUS | Status: AC
  Administered 2019-07-31: 10:00:00 via INTRAVENOUS
  Filled 2019-07-31: qty 250

## 2019-07-31 MED ORDER — MONTELUKAST SODIUM 10 MG PO TABS
ORAL_TABLET | ORAL | Status: AC
  Filled 2019-07-31: qty 1

## 2019-07-31 MED ORDER — METHYLPREDNISOLONE SODIUM SUCC 125 MG IJ SOLR
100.0000 mg | Freq: Once | INTRAMUSCULAR | Status: AC
  Administered 2019-07-31: 100 mg via INTRAVENOUS

## 2019-07-31 MED ORDER — DEXTROSE 5 % IV SOLN
55.0000 mg/m2 | Freq: Once | INTRAVENOUS | Status: AC
  Administered 2019-07-31: 130 mg via INTRAVENOUS
  Filled 2019-07-31: qty 60

## 2019-07-31 MED ORDER — PROCHLORPERAZINE MALEATE 10 MG PO TABS
ORAL_TABLET | ORAL | Status: AC
  Filled 2019-07-31: qty 1

## 2019-07-31 MED ORDER — SODIUM CHLORIDE 0.9 % IV SOLN
15.6000 mg/kg | Freq: Once | INTRAVENOUS | Status: AC
  Administered 2019-07-31: 1700 mg via INTRAVENOUS
  Filled 2019-07-31: qty 80

## 2019-07-31 MED ORDER — DIPHENHYDRAMINE HCL 25 MG PO CAPS
ORAL_CAPSULE | ORAL | Status: AC
  Filled 2019-07-31: qty 2

## 2019-07-31 MED ORDER — ACETAMINOPHEN 325 MG PO TABS
ORAL_TABLET | ORAL | Status: AC
  Filled 2019-07-31: qty 2

## 2019-07-31 MED ORDER — PROCHLORPERAZINE MALEATE 10 MG PO TABS
10.0000 mg | ORAL_TABLET | Freq: Once | ORAL | Status: AC
  Administered 2019-07-31: 10 mg via ORAL

## 2019-07-31 MED ORDER — FAMOTIDINE IN NACL 20-0.9 MG/50ML-% IV SOLN
20.0000 mg | Freq: Once | INTRAVENOUS | Status: AC
  Administered 2019-07-31: 20 mg via INTRAVENOUS

## 2019-07-31 MED ORDER — METHYLPREDNISOLONE SODIUM SUCC 125 MG IJ SOLR
INTRAMUSCULAR | Status: AC
  Filled 2019-07-31: qty 2

## 2019-07-31 NOTE — Patient Instructions (Signed)
[facility] Discharge Instructions for Patients Receiving Chemotherapy  Today you received the following chemotherapy agents: Carfilzomib (Kyprolis) and Daratumumab (Darzalex)  To help prevent nausea and vomiting after your treatment, we encourage you to take your nausea medication as directed.   If you develop nausea and vomiting that is not controlled by your nausea medication, call the clinic.   BELOW ARE SYMPTOMS THAT SHOULD BE REPORTED IMMEDIATELY:  *FEVER GREATER THAN 100.5 F  *CHILLS WITH OR WITHOUT FEVER  NAUSEA AND VOMITING THAT IS NOT CONTROLLED WITH YOUR NAUSEA MEDICATION  *UNUSUAL SHORTNESS OF BREATH  *UNUSUAL BRUISING OR BLEEDING  TENDERNESS IN MOUTH AND THROAT WITH OR WITHOUT PRESENCE OF ULCERS  *URINARY PROBLEMS  *BOWEL PROBLEMS  UNUSUAL RASH Items with * indicate a potential emergency and should be followed up as soon as possible.  Feel free to call the clinic should you have any questions or concerns. The clinic phone number is [phone].  Please show the [facility] at check-in to the Emergency Department and triage nurse.  Coronavirus (COVID-19) Are you at risk?  Are you at risk for the Coronavirus (COVID-19)?  To be considered HIGH RISK for Coronavirus (COVID-19), you have to meet the following criteria:  . Traveled to [place], [place], [place], [place] or [place]; or in the [place] to [place], [place], [place], or [place]; and have fever, cough, and shortness of breath within the last 2 weeks of travel OR . Been in close contact with a person diagnosed with COVID-19 within the last 2 weeks and have fever, cough, and shortness of breath . IF YOU DO NOT MEET THESE CRITERIA, YOU ARE CONSIDERED LOW RISK FOR COVID-19.  What to do if you are HIGH RISK for COVID-19?  [name] If you are having a medical emergency, call 911. . Seek medical care right away. Before you go to a doctor's office, urgent care or emergency department,  call ahead and tell them about your recent travel, contact with someone diagnosed with COVID-19, and your symptoms. You should receive instructions from your physician's office regarding next steps of care.  . When you arrive at healthcare provider, tell the healthcare staff immediately you have returned from visiting [place], [place], [place], [place] or [place]; or traveled in the [place] to [place], [place], [place], or [place]; in the last two weeks or you have been in close contact with a person diagnosed with COVID-19 in the last 2 weeks.   . Tell the health care staff about your symptoms: fever, cough and shortness of breath. . After you have been seen by a medical provider, you will be either: o Tested for (COVID-19) and discharged home on quarantine except to seek medical care if symptoms worsen, and asked to  - Stay home and avoid contact with others until you get your results (4-5 days)  - Avoid travel on public transportation if possible (such as bus, train, or airplane) or o Sent to the Emergency Department by EMS for evaluation, COVID-19 testing, and possible admission depending on your condition and test results.  What to do if you are LOW RISK for COVID-19?  Reduce your risk of any infection by using the same precautions used for avoiding the common cold or flu:  [name] Wash your hands often with soap and warm water for at least 20 seconds.  If soap and water are not readily available, use an alcohol-based hand sanitizer with at least 60% alcohol.  [name]  If coughing or sneezing, cover your mouth and nose by coughing or sneezing into the elbow areas of your shirt or coat, into a tissue or into your sleeve (not your hands). . Avoid shaking hands with others and consider head nods or verbal greetings only. . Avoid touching your eyes, nose, or mouth with unwashed hands.  . Avoid close contact with people who are sick. . Avoid places or events with large numbers of people in one  location, like concerts or sporting events. . Carefully consider travel plans you have or are making. . If you are planning any travel outside or inside the [place], visit the CDC's Travelers' Health webpage for the latest health notices. . If you have some symptoms but not all symptoms, continue to monitor at home and seek medical attention if your symptoms worsen. . If you are having a medical emergency, call 911.   ADDITIONAL HEALTHCARE OPTIONS FOR PATIENTS  [facility] / e-Visit: [email or web address]         [facility]: [phone]  [facility]: [phone]                   [facility]: [phone]   

## 2019-08-01 LAB — KAPPA/LAMBDA LIGHT CHAINS
Kappa free light chain: 4.8 mg/L (ref 3.3–19.4)
Kappa, lambda light chain ratio: >3.2 — ABNORMAL HIGH (ref 0.26–1.65)
Lambda free light chains: <1.5 mg/L — ABNORMAL LOW (ref 5.7–26.3)

## 2019-08-02 LAB — MULTIPLE MYELOMA PANEL, SERUM
Albumin SerPl Elph-Mcnc: 3.8 g/dL (ref 2.9–4.4)
Albumin/Glob SerPl: 1.7 (ref 0.7–1.7)
Alpha 1: 0.2 g/dL (ref 0.0–0.4)
Alpha2 Glob SerPl Elph-Mcnc: 0.8 g/dL (ref 0.4–1.0)
B-Globulin SerPl Elph-Mcnc: 0.8 g/dL (ref 0.7–1.3)
Gamma Glob SerPl Elph-Mcnc: 0.4 g/dL (ref 0.4–1.8)
Globulin, Total: 2.3 g/dL (ref 2.2–3.9)
IgA: 6 mg/dL — ABNORMAL LOW (ref 61–437)
IgG (Immunoglobin G), Serum: 486 mg/dL — ABNORMAL LOW (ref 603–1613)
IgM (Immunoglobulin M), Srm: <5 mg/dL — ABNORMAL LOW (ref 20–172)
M Protein SerPl Elph-Mcnc: 0.2 g/dL — ABNORMAL HIGH
Total Protein ELP: 6.1 g/dL (ref 6.0–8.5)
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
## 2019-08-13 ENCOUNTER — Other Ambulatory Visit: Payer: Self-pay

## 2019-08-13 ENCOUNTER — Inpatient Hospital Stay: Payer: Medicare Other

## 2019-08-13 ENCOUNTER — Telehealth: Payer: Self-pay | Admitting: Hematology

## 2019-08-13 ENCOUNTER — Inpatient Hospital Stay (HOSPITAL_BASED_OUTPATIENT_CLINIC_OR_DEPARTMENT_OTHER): Payer: Medicare Other | Admitting: Hematology

## 2019-08-13 VITALS — BP 140/80 | HR 62 | Temp 98.5°F | Resp 17 | Ht 73.0 in | Wt 236.4 lb

## 2019-08-13 DIAGNOSIS — Z5112 Encounter for antineoplastic immunotherapy: Secondary | ICD-10-CM

## 2019-08-13 DIAGNOSIS — C9000 Multiple myeloma not having achieved remission: Secondary | ICD-10-CM | POA: Diagnosis not present

## 2019-08-13 DIAGNOSIS — E119 Type 2 diabetes mellitus without complications: Secondary | ICD-10-CM | POA: Diagnosis not present

## 2019-08-13 DIAGNOSIS — Z7982 Long term (current) use of aspirin: Secondary | ICD-10-CM | POA: Diagnosis not present

## 2019-08-13 DIAGNOSIS — C9002 Multiple myeloma in relapse: Secondary | ICD-10-CM | POA: Diagnosis not present

## 2019-08-13 DIAGNOSIS — Z79899 Other long term (current) drug therapy: Secondary | ICD-10-CM | POA: Diagnosis not present

## 2019-08-13 DIAGNOSIS — E785 Hyperlipidemia, unspecified: Secondary | ICD-10-CM | POA: Diagnosis not present

## 2019-08-13 LAB — CBC WITH DIFFERENTIAL/PLATELET
Abs Immature Granulocytes: 0.02 10*3/uL (ref 0.00–0.07)
Basophils Absolute: 0 10*3/uL (ref 0.0–0.1)
Basophils Relative: 1 %
Eosinophils Absolute: 0.2 10*3/uL (ref 0.0–0.5)
Eosinophils Relative: 4 %
HCT: 37.5 % — ABNORMAL LOW (ref 39.0–52.0)
Hemoglobin: 12.7 g/dL — ABNORMAL LOW (ref 13.0–17.0)
Immature Granulocytes: 1 %
Lymphocytes Relative: 12 %
Lymphs Abs: 0.5 10*3/uL — ABNORMAL LOW (ref 0.7–4.0)
MCH: 32.7 pg (ref 26.0–34.0)
MCHC: 33.9 g/dL (ref 30.0–36.0)
MCV: 96.6 fL (ref 80.0–100.0)
Monocytes Absolute: 0.4 10*3/uL (ref 0.1–1.0)
Monocytes Relative: 9 %
Neutro Abs: 3.2 10*3/uL (ref 1.7–7.7)
Neutrophils Relative %: 73 %
Platelets: 203 10*3/uL (ref 150–400)
RBC: 3.88 MIL/uL — ABNORMAL LOW (ref 4.22–5.81)
RDW: 13.2 % (ref 11.5–15.5)
WBC: 4.3 10*3/uL (ref 4.0–10.5)
nRBC: 0 % (ref 0.0–0.2)

## 2019-08-13 LAB — CMP (CANCER CENTER ONLY)
ALT: 19 U/L (ref 0–44)
AST: 13 U/L — ABNORMAL LOW (ref 15–41)
Albumin: 4 g/dL (ref 3.5–5.0)
Alkaline Phosphatase: 73 U/L (ref 38–126)
Anion gap: 9 (ref 5–15)
BUN: 8 mg/dL (ref 8–23)
CO2: 25 mmol/L (ref 22–32)
Calcium: 9.2 mg/dL (ref 8.9–10.3)
Chloride: 108 mmol/L (ref 98–111)
Creatinine: 0.8 mg/dL (ref 0.61–1.24)
GFR, Est AFR Am: >60 mL/min (ref >60)
GFR, Estimated: >60 mL/min (ref >60)
Glucose, Bld: 117 mg/dL — ABNORMAL HIGH (ref 70–99)
Potassium: 3.6 mmol/L (ref 3.5–5.1)
Sodium: 142 mmol/L (ref 135–145)
Total Bilirubin: 0.9 mg/dL (ref 0.3–1.2)
Total Protein: 6.2 g/dL — ABNORMAL LOW (ref 6.5–8.1)

## 2019-08-13 NOTE — Telephone Encounter (Signed)
Per 10/27 los appts already scheduled. °

## 2019-08-14 ENCOUNTER — Inpatient Hospital Stay: Payer: Medicare Other

## 2019-08-14 ENCOUNTER — Other Ambulatory Visit: Payer: Self-pay

## 2019-08-14 VITALS — BP 146/78 | HR 65 | Temp 98.3°F | Resp 16

## 2019-08-14 DIAGNOSIS — Z7189 Other specified counseling: Secondary | ICD-10-CM

## 2019-08-14 DIAGNOSIS — E785 Hyperlipidemia, unspecified: Secondary | ICD-10-CM | POA: Diagnosis not present

## 2019-08-14 DIAGNOSIS — Z79899 Other long term (current) drug therapy: Secondary | ICD-10-CM | POA: Diagnosis not present

## 2019-08-14 DIAGNOSIS — Z5112 Encounter for antineoplastic immunotherapy: Secondary | ICD-10-CM | POA: Diagnosis not present

## 2019-08-14 DIAGNOSIS — C9002 Multiple myeloma in relapse: Secondary | ICD-10-CM

## 2019-08-14 DIAGNOSIS — Z7982 Long term (current) use of aspirin: Secondary | ICD-10-CM | POA: Diagnosis not present

## 2019-08-14 DIAGNOSIS — E119 Type 2 diabetes mellitus without complications: Secondary | ICD-10-CM | POA: Diagnosis not present

## 2019-08-14 DIAGNOSIS — C9000 Multiple myeloma not having achieved remission: Secondary | ICD-10-CM

## 2019-08-14 MED ORDER — MONTELUKAST SODIUM 10 MG PO TABS
10.0000 mg | ORAL_TABLET | Freq: Once | ORAL | Status: AC
  Administered 2019-08-14: 10 mg via ORAL

## 2019-08-14 MED ORDER — SODIUM CHLORIDE 0.9 % IV SOLN
15.6000 mg/kg | Freq: Once | INTRAVENOUS | Status: AC
  Administered 2019-08-14: 1700 mg via INTRAVENOUS
  Filled 2019-08-14: qty 80

## 2019-08-14 MED ORDER — PROCHLORPERAZINE MALEATE 10 MG PO TABS
ORAL_TABLET | ORAL | Status: AC
  Filled 2019-08-14: qty 1

## 2019-08-14 MED ORDER — FAMOTIDINE IN NACL 20-0.9 MG/50ML-% IV SOLN
20.0000 mg | Freq: Once | INTRAVENOUS | Status: AC
  Administered 2019-08-14: 20 mg via INTRAVENOUS

## 2019-08-14 MED ORDER — ACETAMINOPHEN 325 MG PO TABS
650.0000 mg | ORAL_TABLET | Freq: Once | ORAL | Status: AC
  Administered 2019-08-14: 650 mg via ORAL

## 2019-08-14 MED ORDER — DIPHENHYDRAMINE HCL 25 MG PO CAPS
50.0000 mg | ORAL_CAPSULE | Freq: Once | ORAL | Status: AC
  Administered 2019-08-14: 50 mg via ORAL

## 2019-08-14 MED ORDER — ZOLEDRONIC ACID 4 MG/100ML IV SOLN
4.0000 mg | Freq: Once | INTRAVENOUS | Status: AC
  Administered 2019-08-14: 4 mg via INTRAVENOUS
  Filled 2019-08-14: qty 100

## 2019-08-14 MED ORDER — ACETAMINOPHEN 325 MG PO TABS
ORAL_TABLET | ORAL | Status: AC
  Filled 2019-08-14: qty 2

## 2019-08-14 MED ORDER — PROCHLORPERAZINE MALEATE 10 MG PO TABS
10.0000 mg | ORAL_TABLET | Freq: Once | ORAL | Status: AC
  Administered 2019-08-14: 10 mg via ORAL

## 2019-08-14 MED ORDER — DIPHENHYDRAMINE HCL 25 MG PO CAPS
ORAL_CAPSULE | ORAL | Status: AC
  Filled 2019-08-14: qty 2

## 2019-08-14 MED ORDER — SODIUM CHLORIDE 0.9 % IV SOLN
Freq: Once | INTRAVENOUS | Status: AC
  Administered 2019-08-14: 09:00:00 via INTRAVENOUS
  Filled 2019-08-14: qty 250

## 2019-08-14 MED ORDER — METHYLPREDNISOLONE SODIUM SUCC 125 MG IJ SOLR
100.0000 mg | Freq: Once | INTRAMUSCULAR | Status: AC
  Administered 2019-08-14: 100 mg via INTRAVENOUS

## 2019-08-14 MED ORDER — METHYLPREDNISOLONE SODIUM SUCC 125 MG IJ SOLR
INTRAMUSCULAR | Status: AC
  Filled 2019-08-14: qty 2

## 2019-08-14 MED ORDER — FAMOTIDINE IN NACL 20-0.9 MG/50ML-% IV SOLN
INTRAVENOUS | Status: AC
  Filled 2019-08-14: qty 50

## 2019-08-14 MED ORDER — MONTELUKAST SODIUM 10 MG PO TABS
ORAL_TABLET | ORAL | Status: AC
  Filled 2019-08-14: qty 1

## 2019-08-14 MED ORDER — DEXTROSE 5 % IV SOLN
55.0000 mg/m2 | Freq: Once | INTRAVENOUS | Status: AC
  Administered 2019-08-14: 11:00:00 130 mg via INTRAVENOUS
  Filled 2019-08-14: qty 60

## 2019-08-14 NOTE — Patient Instructions (Signed)
[facility] Discharge Instructions for Patients Receiving Chemotherapy  Today you received the following chemotherapy agents: Carfilzomib (Kyprolis) and Daratumumab (Darzalex)  To help prevent nausea and vomiting after your treatment, we encourage you to take your nausea medication as directed.   If you develop nausea and vomiting that is not controlled by your nausea medication, call the clinic.   BELOW ARE SYMPTOMS THAT SHOULD BE REPORTED IMMEDIATELY:  *FEVER GREATER THAN 100.5 F  *CHILLS WITH OR WITHOUT FEVER  NAUSEA AND VOMITING THAT IS NOT CONTROLLED WITH YOUR NAUSEA MEDICATION  *UNUSUAL SHORTNESS OF BREATH  *UNUSUAL BRUISING OR BLEEDING  TENDERNESS IN MOUTH AND THROAT WITH OR WITHOUT PRESENCE OF ULCERS  *URINARY PROBLEMS  *BOWEL PROBLEMS  UNUSUAL RASH Items with * indicate a potential emergency and should be followed up as soon as possible.  Feel free to call the clinic should you have any questions or concerns. The clinic phone number is [phone].  Please show the [facility] at check-in to the Emergency Department and triage nurse.  Coronavirus (COVID-19) Are you at risk?  Are you at risk for the Coronavirus (COVID-19)?  To be considered HIGH RISK for Coronavirus (COVID-19), you have to meet the following criteria:  . Traveled to [place], [place], [place], [place] or [place]; or in the [place] to [place], [place], [place], or [place]; and have fever, cough, and shortness of breath within the last 2 weeks of travel OR . Been in close contact with a person diagnosed with COVID-19 within the last 2 weeks and have fever, cough, and shortness of breath . IF YOU DO NOT MEET THESE CRITERIA, YOU ARE CONSIDERED LOW RISK FOR COVID-19.  What to do if you are HIGH RISK for COVID-19?  [name] If you are having a medical emergency, call 911. . Seek medical care right away. Before you go to a doctor's office, urgent care or emergency department,  call ahead and tell them about your recent travel, contact with someone diagnosed with COVID-19, and your symptoms. You should receive instructions from your physician's office regarding next steps of care.  . When you arrive at healthcare provider, tell the healthcare staff immediately you have returned from visiting [place], [place], [place], [place] or [place]; or traveled in the [place] to [place], [place], [place], or [place]; in the last two weeks or you have been in close contact with a person diagnosed with COVID-19 in the last 2 weeks.   . Tell the health care staff about your symptoms: fever, cough and shortness of breath. . After you have been seen by a medical provider, you will be either: o Tested for (COVID-19) and discharged home on quarantine except to seek medical care if symptoms worsen, and asked to  - Stay home and avoid contact with others until you get your results (4-5 days)  - Avoid travel on public transportation if possible (such as bus, train, or airplane) or o Sent to the Emergency Department by EMS for evaluation, COVID-19 testing, and possible admission depending on your condition and test results.  What to do if you are LOW RISK for COVID-19?  Reduce your risk of any infection by using the same precautions used for avoiding the common cold or flu:  [name] Wash your hands often with soap and warm water for at least 20 seconds.  If soap and water are not readily available, use an alcohol-based hand sanitizer with at least 60% alcohol.  [name]  If coughing or sneezing, cover your mouth and nose by coughing or sneezing into the elbow areas of your shirt or coat, into a tissue or into your sleeve (not your hands). . Avoid shaking hands with others and consider head nods or verbal greetings only. . Avoid touching your eyes, nose, or mouth with unwashed hands.  . Avoid close contact with people who are sick. . Avoid places or events with large numbers of people in one  location, like concerts or sporting events. . Carefully consider travel plans you have or are making. . If you are planning any travel outside or inside the [place], visit the CDC's Travelers' Health webpage for the latest health notices. . If you have some symptoms but not all symptoms, continue to monitor at home and seek medical attention if your symptoms worsen. . If you are having a medical emergency, call 911.   ADDITIONAL HEALTHCARE OPTIONS FOR PATIENTS  [facility] / e-Visit: [email or web address]         [facility]: [phone]  [facility]: [phone]                   [facility]: [phone]   

## 2019-08-15 ENCOUNTER — Other Ambulatory Visit: Payer: Self-pay | Admitting: Radiology

## 2019-08-16 ENCOUNTER — Other Ambulatory Visit: Payer: Self-pay

## 2019-08-16 ENCOUNTER — Encounter (HOSPITAL_COMMUNITY): Payer: Self-pay

## 2019-08-16 ENCOUNTER — Ambulatory Visit (HOSPITAL_COMMUNITY)
Admission: RE | Admit: 2019-08-16 | Discharge: 2019-08-16 | Disposition: A | Discharge status: Home / Self Care | Payer: Medicare Other | Source: Ambulatory Visit | Attending: Hematology | Admitting: Hematology

## 2019-08-16 DIAGNOSIS — Z79899 Other long term (current) drug therapy: Secondary | ICD-10-CM | POA: Diagnosis not present

## 2019-08-16 DIAGNOSIS — K219 Gastro-esophageal reflux disease without esophagitis: Secondary | ICD-10-CM | POA: Diagnosis not present

## 2019-08-16 DIAGNOSIS — Z7982 Long term (current) use of aspirin: Secondary | ICD-10-CM | POA: Diagnosis not present

## 2019-08-16 DIAGNOSIS — E785 Hyperlipidemia, unspecified: Secondary | ICD-10-CM | POA: Diagnosis not present

## 2019-08-16 DIAGNOSIS — D539 Nutritional anemia, unspecified: Secondary | ICD-10-CM | POA: Diagnosis not present

## 2019-08-16 DIAGNOSIS — H409 Unspecified glaucoma: Secondary | ICD-10-CM | POA: Diagnosis not present

## 2019-08-16 DIAGNOSIS — D72829 Elevated white blood cell count, unspecified: Secondary | ICD-10-CM | POA: Diagnosis not present

## 2019-08-16 DIAGNOSIS — C9000 Multiple myeloma not having achieved remission: Secondary | ICD-10-CM | POA: Diagnosis not present

## 2019-08-16 DIAGNOSIS — M549 Dorsalgia, unspecified: Secondary | ICD-10-CM | POA: Insufficient documentation

## 2019-08-16 DIAGNOSIS — D72820 Lymphocytosis (symptomatic): Secondary | ICD-10-CM | POA: Diagnosis not present

## 2019-08-16 DIAGNOSIS — Z5112 Encounter for antineoplastic immunotherapy: Secondary | ICD-10-CM

## 2019-08-16 DIAGNOSIS — C9002 Multiple myeloma in relapse: Secondary | ICD-10-CM | POA: Diagnosis not present

## 2019-08-16 LAB — CBC WITH DIFFERENTIAL/PLATELET
Abs Immature Granulocytes: 0.1 10*3/uL — ABNORMAL HIGH (ref 0.00–0.07)
Basophils Absolute: 0 10*3/uL (ref 0.0–0.1)
Basophils Relative: 0 %
Eosinophils Absolute: 0 10*3/uL (ref 0.0–0.5)
Eosinophils Relative: 0 %
HCT: 36.6 % — ABNORMAL LOW (ref 39.0–52.0)
Hemoglobin: 12 g/dL — ABNORMAL LOW (ref 13.0–17.0)
Immature Granulocytes: 1 %
Lymphocytes Relative: 5 %
Lymphs Abs: 0.5 10*3/uL — ABNORMAL LOW (ref 0.7–4.0)
MCH: 33.1 pg (ref 26.0–34.0)
MCHC: 32.8 g/dL (ref 30.0–36.0)
MCV: 100.8 fL — ABNORMAL HIGH (ref 80.0–100.0)
Monocytes Absolute: 1.3 10*3/uL — ABNORMAL HIGH (ref 0.1–1.0)
Monocytes Relative: 11 %
Neutro Abs: 9.6 10*3/uL — ABNORMAL HIGH (ref 1.7–7.7)
Neutrophils Relative %: 83 %
Platelets: 205 10*3/uL (ref 150–400)
RBC: 3.63 MIL/uL — ABNORMAL LOW (ref 4.22–5.81)
RDW: 13.5 % (ref 11.5–15.5)
WBC: 11.5 10*3/uL — ABNORMAL HIGH (ref 4.0–10.5)
nRBC: 0.3 % — ABNORMAL HIGH (ref 0.0–0.2)

## 2019-08-16 LAB — PROTIME-INR
INR: 1.1 (ref 0.8–1.2)
Prothrombin Time: 13.9 seconds (ref 11.4–15.2)

## 2019-08-16 MED ORDER — LIDOCAINE HCL (PF) 1 % IJ SOLN
INTRAMUSCULAR | Status: AC | PRN
  Administered 2019-08-16: 10 mL

## 2019-08-16 MED ORDER — FENTANYL CITRATE (PF) 100 MCG/2ML IJ SOLN
INTRAMUSCULAR | Status: AC | PRN
  Administered 2019-08-16 (×2): 50 ug via INTRAVENOUS

## 2019-08-16 MED ORDER — MIDAZOLAM HCL 2 MG/2ML IJ SOLN
INTRAMUSCULAR | Status: AC
  Filled 2019-08-16: qty 4

## 2019-08-16 MED ORDER — SODIUM CHLORIDE 0.9 % IV SOLN
INTRAVENOUS | Status: DC
  Administered 2019-08-16: 09:00:00 via INTRAVENOUS

## 2019-08-16 MED ORDER — MIDAZOLAM HCL 2 MG/2ML IJ SOLN
INTRAMUSCULAR | Status: AC | PRN
  Administered 2019-08-16 (×4): 1 mg via INTRAVENOUS

## 2019-08-16 MED ORDER — FENTANYL CITRATE (PF) 100 MCG/2ML IJ SOLN
INTRAMUSCULAR | Status: AC
  Filled 2019-08-16: qty 2

## 2019-08-16 NOTE — Discharge Instructions (Signed)
Bone Marrow Aspiration and Bone Marrow Biopsy, Adult, Care After °This sheet gives you information about how to care for yourself after your procedure. Your health care provider may also give you more specific instructions. If you have problems or questions, contact your health care provider. °What can I expect after the procedure? °After the procedure, it is common to have: °· Mild pain and tenderness. °· Swelling. °· Bruising. °Follow these instructions at home: °Puncture site care ° °  ° °· Follow instructions from your health care provider about how to take care of the puncture site. Make sure you: °? Wash your hands with soap and water before you change your bandage (dressing). If soap and water are not available, use hand sanitizer. °? Change your dressing as told by your health care provider. °· Check your puncture site every day for signs of infection. Check for: °? More redness, swelling, or pain. °? More fluid or blood. °? Warmth. °? Pus or a bad smell. °General instructions °· Take over-the-counter and prescription medicines only as told by your health care provider. °· Do not take baths, swim, or use a hot tub until your health care provider approves. Ask if you can take a shower or have a sponge bath. °· Return to your normal activities as told by your health care provider. Ask your health care provider what activities are safe for you. °· Do not drive for 24 hours if you were given a medicine to help you relax (sedative) during your procedure. °· Keep all follow-up visits as told by your health care provider. This is important. °Contact a health care provider if: °· Your pain is not controlled with medicine. °Get help right away if: °· You have a fever. °· You have more redness, swelling, or pain around the puncture site. °· You have more fluid or blood coming from the puncture site. °· Your puncture site feels warm to the touch. °· You have pus or a bad smell coming from the puncture site. °These  symptoms may represent a serious problem that is an emergency. Do not wait to see if the symptoms will go away. Get medical help right away. Call your local emergency services (911 in the [place] Do not drive yourself to the hospital. °Summary °· After the procedure, it is common to have mild pain, tenderness, swelling, and bruising. °· Follow instructions from your health care provider about how to take care of the puncture site. °· Get help right away if you have any symptoms of infection or if you have more blood or fluid coming from the puncture site. °This information is not intended to replace advice given to you by your health care provider. Make sure you discuss any questions you have with your health care provider. °Document Released: 04/22/2005 Document Revised: 01/16/2018 Document Reviewed: 03/16/2016 °Elsevier Patient Education © 2020 [employer] Inc[employer] ° ° ° ° °Moderate Conscious Sedation, Adult, Care After °These instructions provide you with information about caring for yourself after your procedure. Your health care provider may also give you more specific instructions. Your treatment has been planned according to current medical practices, but problems sometimes occur. Call your health care provider if you have any problems or questions after your procedure. °What can I expect after the procedure? °After your procedure, it is common: °· To feel sleepy for several hours. °· To feel clumsy and have poor balance for several hours. °· To have poor judgment for several hours. °· To vomit if you eat too soon. °  Follow these instructions at home: °For at least 24 hours after the procedure: ° °· Do not: °? Participate in activities where you could fall or become injured. °? Drive. °? Use heavy machinery. °? Drink alcohol. °? Take sleeping pills or medicines that cause drowsiness. °? Make important decisions or sign legal documents. °? Take care of children on your own. °· Rest. °Eating and drinking °· Follow the  diet recommended by your health care provider. °· If you vomit: °? Drink water, juice, or soup when you can drink without vomiting. °? Make sure you have little or no nausea before eating solid foods. °General instructions °· Have a responsible adult stay with you until you are awake and alert. °· Take over-the-counter and prescription medicines only as told by your health care provider. °· If you smoke, do not smoke without supervision. °· Keep all follow-up visits as told by your health care provider. This is important. °Contact a health care provider if: °· You keep feeling nauseous or you keep vomiting. °· You feel light-headed. °· You develop a rash. °· You have a fever. °Get help right away if: °· You have trouble breathing. °This information is not intended to replace advice given to you by your health care provider. Make sure you discuss any questions you have with your health care provider. °Document Released: 07/24/2013 Document Revised: 09/15/2017 Document Reviewed: 01/23/2016 °Elsevier Patient Education © 2020 Elsevier Inc. ° °

## 2019-08-16 NOTE — Procedures (Signed)
Interventional Radiology Procedure Note  Procedure: CT guided bone marrow aspiration and biopsy  Complications: None  EBL: < 10 mL  Findings: Aspirate and core biopsy performed of bone marrow in right iliac bone.  Plan: Bedrest supine x 1 hrs  Anastasio Wogan T. Yaret Hush, M.D Pager:  [phone]   

## 2019-08-16 NOTE — H&P (Signed)
Referring Physician(s): [name]  Supervising Physician: Aletta Edouard  Patient Status:  [name] OP  Chief Complaint: "I'm here for another bone marrow biopsy"   Subjective: Patient familiar to IR service from bone marrow biopsy in 2018 and  left thyroid/laryngeal mass biopsy in 2019.  He presents again today for CT-guided bone marrow biopsy to evaluate response to treatment for relapsing multiple myeloma.  He currently denies fever, headache, chest pain, dyspnea, cough, abdominal pain, nausea, vomiting or bleeding.  He does back pain.  Past Medical History:  Diagnosis Date  . Angioedema   . CPAP (continuous positive airway pressure) dependence    uses at night  . Gastroesophageal reflux disease without esophagitis   . History of radiation therapy 06/04/18- 06/15/18   25 Gy in 10 fractions to the laryngeal/ cartilage lesion  . Hyperlipidemia   . Multiple myeloma ([facility]) 2012   Treated at [facility]  . Non morbid obesity due to excess calories   . Prediabetes   . Prediabetes   . Unspecified glaucoma(365.9)   . Urticaria, unspecified    Past Surgical History:  Procedure Laterality Date  . IR RADIOLOGIST EVAL & MGMT  11/13/2018  . PORTACATH PLACEMENT    . PORTACATH PLACEMENT     removed 02/2016      Allergies: Patient has no known allergies.  Medications: Prior to Admission medications   Medication Sig Start Date End Date Taking? Authorizing Provider  acyclovir (ZOVIRAX) 400 MG tablet Take 1 tablet (400 mg total) by mouth 2 (two) times daily. 11/06/18   [name], MD  aspirin 81 MG tablet Take 81 mg by mouth daily.    [provider]  atorvastatin (LIPITOR) 20 MG tablet Take 20 mg by mouth every evening.     [provider]  dexamethasone (DECADRON) 4 MG tablet 13m (5 tabs) with breakfast the day after each daratumumab treatment 07/16/19   KBrunetta Genera MD  Multiple Vitamin (MULTIVITAMIN) tablet Take 1 tablet by mouth daily.     [provider]  ondansetron (ZOFRAN) 8 MG tablet Take 1 tablet (8 mg total) by mouth 2 (two) times daily as needed (Nausea or vomiting). 11/06/18   KBrunetta Genera MD  oxyCODONE (OXY IR/ROXICODONE) 5 MG immediate release tablet Take 1-2 tablets (5-10 mg total) by mouth every 4 (four) hours as needed for severe pain or breakthrough pain. Patient taking differently: Take 5-10 mg by mouth every 4 (four) hours as needed for severe pain or breakthrough pain. Patient reports taking only as needed - rarely 01/02/19   KBrunetta Genera MD  pantoprazole (PROTONIX) 40 MG tablet Take 40 mg by mouth daily.     [provider]  prochlorperazine (COMPAZINE) 10 MG tablet Take 1 tablet (10 mg total) by mouth every 6 (six) hours as needed (Nausea or vomiting). 11/06/18   KBrunetta Genera MD  senna-docusate (SENOKOT-S) 8.6-50 MG tablet Take 2 tablets by mouth at bedtime. Takes 50 mg 01/29/19   KBrunetta Genera MD     Vital Signs: Afebrile, heart rate 76, respirations 16, O2 sat 97% room air   Physical Exam awake, alert.  Chest clear to auscultation bilaterally.  Heart with regular rate and rhythm.  Abdomen soft, protuberant,  positive bowel sounds, nontender.  Trace pretibial edema bilaterally.  Imaging: No results found.  Labs:  CBC: Recent Labs    07/03/19 0811 07/16/19 0838 07/31/19 0847 08/13/19 0902  WBC 4.1 4.3 5.6 4.3  HGB 12.7* 12.7* 13.1  12.7*  HCT 38.4* 38.1* 38.6* 37.5*  PLT 205 201 215 203    COAGS: No results for input(s): INR, APTT in the last 8760 hours.  BMP: Recent Labs    07/03/19 0811 07/16/19 0838 07/31/19 0847 08/13/19 0902  NA 141 142 142 142  K 3.7 4.7 3.7 3.6  CL 107 107 106 108  CO2 25 29 24 25   GLUCOSE 164* 110* 119* 117*  BUN 11 16 10 8   CALCIUM 8.8* 9.2 9.0 9.2  CREATININE 0.82 0.83 0.83 0.80  GFRNONAA >60 >60 >60 >60  GFRAA >60 >60 >60 >60    LIVER FUNCTION TESTS: Recent Labs    07/03/19 0811 07/16/19 0838  07/31/19 0847 08/13/19 0902  BILITOT 1.1 1.0 0.8 0.9  AST 15 14* 13* 13*  ALT 16 16 15 19   ALKPHOS 70 73 77 73  PROT 5.9* 6.2* 6.5 6.2*  ALBUMIN 4.1 4.2 4.0 4.0    Assessment and Plan: Patient with history of relapsing multiple myeloma; presents today for CT-guided bone marrow biopsy to evaluate treatment response.Risks and benefits of procedure was discussed with the patient  including, but not limited to bleeding, infection, damage to adjacent structures or low yield requiring additional tests.  All of the questions were answered and there is agreement to proceed.  Consent signed and in chart.   LABS PENDING  Electronically Signed: D. Rowe Robert, PA-C 08/16/2019, 9:10 AM   I spent a total of  20 minutes at the the patient's bedside AND on the patient's hospital floor or unit, greater than 50% of which was counseling/coordinating care for CT guided bone marrow biopsy

## 2019-08-20 LAB — SURGICAL PATHOLOGY

## 2019-08-21 DIAGNOSIS — R490 Dysphonia: Secondary | ICD-10-CM | POA: Diagnosis not present

## 2019-08-21 DIAGNOSIS — Z7289 Other problems related to lifestyle: Secondary | ICD-10-CM | POA: Diagnosis not present

## 2019-08-21 DIAGNOSIS — J343 Hypertrophy of nasal turbinates: Secondary | ICD-10-CM | POA: Diagnosis not present

## 2019-08-21 DIAGNOSIS — C9001 Multiple myeloma in remission: Secondary | ICD-10-CM | POA: Diagnosis not present

## 2019-08-21 DIAGNOSIS — R1313 Dysphagia, pharyngeal phase: Secondary | ICD-10-CM | POA: Diagnosis not present

## 2019-08-21 DIAGNOSIS — Z923 Personal history of irradiation: Secondary | ICD-10-CM | POA: Diagnosis not present

## 2019-08-26 ENCOUNTER — Encounter (HOSPITAL_COMMUNITY): Payer: Self-pay | Admitting: Hematology

## 2019-08-28 ENCOUNTER — Inpatient Hospital Stay: Payer: Medicare Other | Attending: Hematology

## 2019-08-28 ENCOUNTER — Inpatient Hospital Stay: Payer: Medicare Other

## 2019-08-28 ENCOUNTER — Other Ambulatory Visit: Payer: Self-pay

## 2019-08-28 VITALS — BP 138/72 | HR 60 | Temp 98.2°F | Resp 18

## 2019-08-28 DIAGNOSIS — C9000 Multiple myeloma not having achieved remission: Secondary | ICD-10-CM

## 2019-08-28 DIAGNOSIS — E785 Hyperlipidemia, unspecified: Secondary | ICD-10-CM | POA: Diagnosis not present

## 2019-08-28 DIAGNOSIS — C9002 Multiple myeloma in relapse: Secondary | ICD-10-CM | POA: Diagnosis not present

## 2019-08-28 DIAGNOSIS — Z5112 Encounter for antineoplastic immunotherapy: Secondary | ICD-10-CM | POA: Diagnosis not present

## 2019-08-28 DIAGNOSIS — Z7982 Long term (current) use of aspirin: Secondary | ICD-10-CM | POA: Insufficient documentation

## 2019-08-28 DIAGNOSIS — Z79899 Other long term (current) drug therapy: Secondary | ICD-10-CM | POA: Insufficient documentation

## 2019-08-28 DIAGNOSIS — Z7189 Other specified counseling: Secondary | ICD-10-CM

## 2019-08-28 DIAGNOSIS — E119 Type 2 diabetes mellitus without complications: Secondary | ICD-10-CM | POA: Insufficient documentation

## 2019-08-28 LAB — CBC WITH DIFFERENTIAL/PLATELET
Abs Immature Granulocytes: 0.03 10*3/uL (ref 0.00–0.07)
Basophils Absolute: 0.1 10*3/uL (ref 0.0–0.1)
Basophils Relative: 1 %
Eosinophils Absolute: 0.2 10*3/uL (ref 0.0–0.5)
Eosinophils Relative: 3 %
HCT: 37.4 % — ABNORMAL LOW (ref 39.0–52.0)
Hemoglobin: 12.6 g/dL — ABNORMAL LOW (ref 13.0–17.0)
Immature Granulocytes: 1 %
Lymphocytes Relative: 10 %
Lymphs Abs: 0.5 10*3/uL — ABNORMAL LOW (ref 0.7–4.0)
MCH: 33.4 pg (ref 26.0–34.0)
MCHC: 33.7 g/dL (ref 30.0–36.0)
MCV: 99.2 fL (ref 80.0–100.0)
Monocytes Absolute: 0.4 10*3/uL (ref 0.1–1.0)
Monocytes Relative: 8 %
Neutro Abs: 3.8 10*3/uL (ref 1.7–7.7)
Neutrophils Relative %: 77 %
Platelets: 221 10*3/uL (ref 150–400)
RBC: 3.77 MIL/uL — ABNORMAL LOW (ref 4.22–5.81)
RDW: 13.3 % (ref 11.5–15.5)
WBC: 5 10*3/uL (ref 4.0–10.5)
nRBC: 0 % (ref 0.0–0.2)

## 2019-08-28 LAB — CMP (CANCER CENTER ONLY)
ALT: 15 U/L (ref 0–44)
AST: 13 U/L — ABNORMAL LOW (ref 15–41)
Albumin: 4 g/dL (ref 3.5–5.0)
Alkaline Phosphatase: 67 U/L (ref 38–126)
Anion gap: 10 (ref 5–15)
BUN: 13 mg/dL (ref 8–23)
CO2: 25 mmol/L (ref 22–32)
Calcium: 9.2 mg/dL (ref 8.9–10.3)
Chloride: 104 mmol/L (ref 98–111)
Creatinine: 0.83 mg/dL (ref 0.61–1.24)
GFR, Est AFR Am: >60 mL/min (ref >60)
GFR, Estimated: >60 mL/min (ref >60)
Glucose, Bld: 120 mg/dL — ABNORMAL HIGH (ref 70–99)
Potassium: 4 mmol/L (ref 3.5–5.1)
Sodium: 139 mmol/L (ref 135–145)
Total Bilirubin: 1 mg/dL (ref 0.3–1.2)
Total Protein: 6.1 g/dL — ABNORMAL LOW (ref 6.5–8.1)

## 2019-08-28 MED ORDER — PROCHLORPERAZINE MALEATE 10 MG PO TABS
ORAL_TABLET | ORAL | Status: AC
  Filled 2019-08-28: qty 1

## 2019-08-28 MED ORDER — FAMOTIDINE IN NACL 20-0.9 MG/50ML-% IV SOLN
20.0000 mg | Freq: Once | INTRAVENOUS | Status: AC
  Administered 2019-08-28: 20 mg via INTRAVENOUS

## 2019-08-28 MED ORDER — METHYLPREDNISOLONE SODIUM SUCC 125 MG IJ SOLR
100.0000 mg | Freq: Once | INTRAMUSCULAR | Status: AC
  Administered 2019-08-28: 100 mg via INTRAVENOUS

## 2019-08-28 MED ORDER — DIPHENHYDRAMINE HCL 25 MG PO CAPS
50.0000 mg | ORAL_CAPSULE | Freq: Once | ORAL | Status: AC
  Administered 2019-08-28: 50 mg via ORAL

## 2019-08-28 MED ORDER — FAMOTIDINE IN NACL 20-0.9 MG/50ML-% IV SOLN
INTRAVENOUS | Status: AC
  Filled 2019-08-28: qty 50

## 2019-08-28 MED ORDER — SODIUM CHLORIDE 0.9 % IV SOLN
Freq: Once | INTRAVENOUS | Status: AC
  Administered 2019-08-28: 09:00:00 via INTRAVENOUS
  Filled 2019-08-28: qty 250

## 2019-08-28 MED ORDER — MONTELUKAST SODIUM 10 MG PO TABS
10.0000 mg | ORAL_TABLET | Freq: Once | ORAL | Status: AC
  Administered 2019-08-28: 10 mg via ORAL

## 2019-08-28 MED ORDER — DIPHENHYDRAMINE HCL 25 MG PO CAPS
ORAL_CAPSULE | ORAL | Status: AC
  Filled 2019-08-28: qty 2

## 2019-08-28 MED ORDER — SODIUM CHLORIDE 0.9 % IV SOLN
15.6000 mg/kg | Freq: Once | INTRAVENOUS | Status: AC
  Administered 2019-08-28: 1700 mg via INTRAVENOUS
  Filled 2019-08-28: qty 80

## 2019-08-28 MED ORDER — DEXTROSE 5 % IV SOLN
55.0000 mg/m2 | Freq: Once | INTRAVENOUS | Status: AC
  Administered 2019-08-28: 130 mg via INTRAVENOUS
  Filled 2019-08-28: qty 5

## 2019-08-28 MED ORDER — PROCHLORPERAZINE MALEATE 10 MG PO TABS
10.0000 mg | ORAL_TABLET | Freq: Once | ORAL | Status: AC
  Administered 2019-08-28: 09:00:00 10 mg via ORAL

## 2019-08-28 MED ORDER — ACETAMINOPHEN 325 MG PO TABS
650.0000 mg | ORAL_TABLET | Freq: Once | ORAL | Status: AC
  Administered 2019-08-28: 650 mg via ORAL

## 2019-08-28 MED ORDER — METHYLPREDNISOLONE SODIUM SUCC 125 MG IJ SOLR
INTRAMUSCULAR | Status: AC
  Filled 2019-08-28: qty 2

## 2019-08-28 MED ORDER — ACETAMINOPHEN 325 MG PO TABS
ORAL_TABLET | ORAL | Status: AC
  Filled 2019-08-28: qty 2

## 2019-08-28 MED ORDER — MONTELUKAST SODIUM 10 MG PO TABS
ORAL_TABLET | ORAL | Status: AC
  Filled 2019-08-28: qty 1

## 2019-08-28 NOTE — Progress Notes (Signed)
Pressure dressing clean, dry, and intact at discharge.  No complaints from patient. VSS.

## 2019-08-28 NOTE — Patient Instructions (Signed)
[facility] Discharge Instructions for Patients Receiving Chemotherapy  Today you received the following chemotherapy agents: Carfilzomib (Kyprolis) and Daratumumab (Darzalex)  To help prevent nausea and vomiting after your treatment, we encourage you to take your nausea medication as directed.   If you develop nausea and vomiting that is not controlled by your nausea medication, call the clinic.   BELOW ARE SYMPTOMS THAT SHOULD BE REPORTED IMMEDIATELY:  *FEVER GREATER THAN 100.5 F  *CHILLS WITH OR WITHOUT FEVER  NAUSEA AND VOMITING THAT IS NOT CONTROLLED WITH YOUR NAUSEA MEDICATION  *UNUSUAL SHORTNESS OF BREATH  *UNUSUAL BRUISING OR BLEEDING  TENDERNESS IN MOUTH AND THROAT WITH OR WITHOUT PRESENCE OF ULCERS  *URINARY PROBLEMS  *BOWEL PROBLEMS  UNUSUAL RASH Items with * indicate a potential emergency and should be followed up as soon as possible.  Feel free to call the clinic should you have any questions or concerns. The clinic phone number is [phone].  Please show the [facility] at check-in to the Emergency Department and triage nurse.  Coronavirus (COVID-19) Are you at risk?  Are you at risk for the Coronavirus (COVID-19)?  To be considered HIGH RISK for Coronavirus (COVID-19), you have to meet the following criteria:  . Traveled to [place], [place], [place], [place] or [place]; or in the [place] to [place], [place], [place], or [place]; and have fever, cough, and shortness of breath within the last 2 weeks of travel OR . Been in close contact with a person diagnosed with COVID-19 within the last 2 weeks and have fever, cough, and shortness of breath . IF YOU DO NOT MEET THESE CRITERIA, YOU ARE CONSIDERED LOW RISK FOR COVID-19.  What to do if you are HIGH RISK for COVID-19?  [name] If you are having a medical emergency, call 911. . Seek medical care right away. Before you go to a doctor's office, urgent care or emergency department,  call ahead and tell them about your recent travel, contact with someone diagnosed with COVID-19, and your symptoms. You should receive instructions from your physician's office regarding next steps of care.  . When you arrive at healthcare provider, tell the healthcare staff immediately you have returned from visiting [place], [place], [place], [place] or [place]; or traveled in the [place] to [place], [place], [place], or [place]; in the last two weeks or you have been in close contact with a person diagnosed with COVID-19 in the last 2 weeks.   . Tell the health care staff about your symptoms: fever, cough and shortness of breath. . After you have been seen by a medical provider, you will be either: o Tested for (COVID-19) and discharged home on quarantine except to seek medical care if symptoms worsen, and asked to  - Stay home and avoid contact with others until you get your results (4-5 days)  - Avoid travel on public transportation if possible (such as bus, train, or airplane) or o Sent to the Emergency Department by EMS for evaluation, COVID-19 testing, and possible admission depending on your condition and test results.  What to do if you are LOW RISK for COVID-19?  Reduce your risk of any infection by using the same precautions used for avoiding the common cold or flu:  [name] Wash your hands often with soap and warm water for at least 20 seconds.  If soap and water are not readily available, use an alcohol-based hand sanitizer with at least 60% alcohol.  [name]  If coughing or sneezing, cover your mouth and nose by coughing or sneezing into the elbow areas of your shirt or coat, into a tissue or into your sleeve (not your hands). . Avoid shaking hands with others and consider head nods or verbal greetings only. . Avoid touching your eyes, nose, or mouth with unwashed hands.  . Avoid close contact with people who are sick. . Avoid places or events with large numbers of people in one  location, like concerts or sporting events. . Carefully consider travel plans you have or are making. . If you are planning any travel outside or inside the [place], visit the CDC's Travelers' Health webpage for the latest health notices. . If you have some symptoms but not all symptoms, continue to monitor at home and seek medical attention if your symptoms worsen. . If you are having a medical emergency, call 911.   ADDITIONAL HEALTHCARE OPTIONS FOR PATIENTS  [facility] / e-Visit: [email or web address]         [facility]: [phone]  [facility]: [phone]                   [facility]: [phone]   

## 2019-08-31 LAB — MULTIPLE MYELOMA PANEL, SERUM
Albumin SerPl Elph-Mcnc: 3.7 g/dL (ref 2.9–4.4)
Albumin/Glob SerPl: 1.7 (ref 0.7–1.7)
Alpha 1: 0.2 g/dL (ref 0.0–0.4)
Alpha2 Glob SerPl Elph-Mcnc: 0.6 g/dL (ref 0.4–1.0)
B-Globulin SerPl Elph-Mcnc: 0.9 g/dL (ref 0.7–1.3)
Gamma Glob SerPl Elph-Mcnc: 0.4 g/dL (ref 0.4–1.8)
Globulin, Total: 2.2 g/dL (ref 2.2–3.9)
IgA: 5 mg/dL — ABNORMAL LOW (ref 61–437)
IgG (Immunoglobin G), Serum: 446 mg/dL — ABNORMAL LOW (ref 603–1613)
IgM (Immunoglobulin M), Srm: <5 mg/dL — ABNORMAL LOW (ref 20–172)
M Protein SerPl Elph-Mcnc: 0.2 g/dL — ABNORMAL HIGH
Total Protein ELP: 5.9 g/dL — ABNORMAL LOW (ref 6.0–8.5)

## 2019-09-03 ENCOUNTER — Other Ambulatory Visit: Payer: Self-pay

## 2019-09-03 ENCOUNTER — Ambulatory Visit (HOSPITAL_COMMUNITY)
Admission: RE | Admit: 2019-09-03 | Discharge: 2019-09-03 | Disposition: A | Discharge status: Home / Self Care | Payer: Medicare Other | Source: Ambulatory Visit | Attending: Hematology | Admitting: Hematology

## 2019-09-03 DIAGNOSIS — E785 Hyperlipidemia, unspecified: Secondary | ICD-10-CM | POA: Insufficient documentation

## 2019-09-03 DIAGNOSIS — C9000 Multiple myeloma not having achieved remission: Secondary | ICD-10-CM | POA: Insufficient documentation

## 2019-09-03 DIAGNOSIS — Z7982 Long term (current) use of aspirin: Secondary | ICD-10-CM | POA: Diagnosis not present

## 2019-09-03 DIAGNOSIS — Z5112 Encounter for antineoplastic immunotherapy: Secondary | ICD-10-CM | POA: Insufficient documentation

## 2019-09-03 DIAGNOSIS — C7951 Secondary malignant neoplasm of bone: Secondary | ICD-10-CM | POA: Diagnosis not present

## 2019-09-03 DIAGNOSIS — Z79899 Other long term (current) drug therapy: Secondary | ICD-10-CM | POA: Insufficient documentation

## 2019-09-03 DIAGNOSIS — R7303 Prediabetes: Secondary | ICD-10-CM | POA: Diagnosis not present

## 2019-09-03 LAB — GLUCOSE, CAPILLARY: Glucose-Capillary: 121 mg/dL — ABNORMAL HIGH (ref 70–99)

## 2019-09-03 MED ORDER — FLUDEOXYGLUCOSE F - 18 (FDG) INJECTION
11.8700 | Freq: Once | INTRAVENOUS | Status: AC | PRN
  Administered 2019-09-03: 11.87 via INTRAVENOUS

## 2019-09-04 DIAGNOSIS — L821 Other seborrheic keratosis: Secondary | ICD-10-CM | POA: Diagnosis not present

## 2019-09-04 DIAGNOSIS — D1801 Hemangioma of skin and subcutaneous tissue: Secondary | ICD-10-CM | POA: Diagnosis not present

## 2019-09-04 DIAGNOSIS — L570 Actinic keratosis: Secondary | ICD-10-CM | POA: Diagnosis not present

## 2019-09-04 DIAGNOSIS — L723 Sebaceous cyst: Secondary | ICD-10-CM | POA: Diagnosis not present

## 2019-09-05 ENCOUNTER — Telehealth: Payer: Self-pay | Admitting: Hematology

## 2019-09-05 NOTE — Telephone Encounter (Signed)
Due to [facility] 11/24. Lab/fu 11/24 cxd. Patient will have lab/tx 11/25, lab/fu 12/8 and tx only 12/9. Left message for patient on both home/cell. Schedule mailed.

## 2019-09-10 ENCOUNTER — Ambulatory Visit: Payer: Medicare Other | Admitting: Hematology

## 2019-09-10 ENCOUNTER — Other Ambulatory Visit: Payer: Medicare Other

## 2019-09-11 ENCOUNTER — Other Ambulatory Visit: Payer: Self-pay

## 2019-09-11 ENCOUNTER — Inpatient Hospital Stay: Payer: Medicare Other

## 2019-09-11 VITALS — BP 123/59 | HR 70 | Temp 98.3°F | Resp 16

## 2019-09-11 DIAGNOSIS — C9000 Multiple myeloma not having achieved remission: Secondary | ICD-10-CM

## 2019-09-11 DIAGNOSIS — Z5112 Encounter for antineoplastic immunotherapy: Secondary | ICD-10-CM

## 2019-09-11 DIAGNOSIS — C9002 Multiple myeloma in relapse: Secondary | ICD-10-CM | POA: Diagnosis not present

## 2019-09-11 DIAGNOSIS — E785 Hyperlipidemia, unspecified: Secondary | ICD-10-CM | POA: Diagnosis not present

## 2019-09-11 DIAGNOSIS — Z7982 Long term (current) use of aspirin: Secondary | ICD-10-CM | POA: Diagnosis not present

## 2019-09-11 DIAGNOSIS — Z7189 Other specified counseling: Secondary | ICD-10-CM

## 2019-09-11 DIAGNOSIS — Z79899 Other long term (current) drug therapy: Secondary | ICD-10-CM | POA: Diagnosis not present

## 2019-09-11 DIAGNOSIS — E119 Type 2 diabetes mellitus without complications: Secondary | ICD-10-CM | POA: Diagnosis not present

## 2019-09-11 LAB — CMP (CANCER CENTER ONLY)
ALT: 18 U/L (ref 0–44)
AST: 14 U/L — ABNORMAL LOW (ref 15–41)
Albumin: 4 g/dL (ref 3.5–5.0)
Alkaline Phosphatase: 77 U/L (ref 38–126)
Anion gap: 11 (ref 5–15)
BUN: 9 mg/dL (ref 8–23)
CO2: 22 mmol/L (ref 22–32)
Calcium: 8.2 mg/dL — ABNORMAL LOW (ref 8.9–10.3)
Chloride: 109 mmol/L (ref 98–111)
Creatinine: 0.77 mg/dL (ref 0.61–1.24)
GFR, Est AFR Am: >60 mL/min (ref >60)
GFR, Estimated: >60 mL/min (ref >60)
Glucose, Bld: 119 mg/dL — ABNORMAL HIGH (ref 70–99)
Potassium: 3.5 mmol/L (ref 3.5–5.1)
Sodium: 142 mmol/L (ref 135–145)
Total Bilirubin: 1.4 mg/dL — ABNORMAL HIGH (ref 0.3–1.2)
Total Protein: 6.2 g/dL — ABNORMAL LOW (ref 6.5–8.1)

## 2019-09-11 LAB — CBC WITH DIFFERENTIAL/PLATELET
Abs Immature Granulocytes: 0.02 10*3/uL (ref 0.00–0.07)
Basophils Absolute: 0 10*3/uL (ref 0.0–0.1)
Basophils Relative: 1 %
Eosinophils Absolute: 0.1 10*3/uL (ref 0.0–0.5)
Eosinophils Relative: 2 %
HCT: 36.7 % — ABNORMAL LOW (ref 39.0–52.0)
Hemoglobin: 12.6 g/dL — ABNORMAL LOW (ref 13.0–17.0)
Immature Granulocytes: 0 %
Lymphocytes Relative: 5 %
Lymphs Abs: 0.3 10*3/uL — ABNORMAL LOW (ref 0.7–4.0)
MCH: 33 pg (ref 26.0–34.0)
MCHC: 34.3 g/dL (ref 30.0–36.0)
MCV: 96.1 fL (ref 80.0–100.0)
Monocytes Absolute: 0.4 10*3/uL (ref 0.1–1.0)
Monocytes Relative: 9 %
Neutro Abs: 4.3 10*3/uL (ref 1.7–7.7)
Neutrophils Relative %: 83 %
Platelets: 189 10*3/uL (ref 150–400)
RBC: 3.82 MIL/uL — ABNORMAL LOW (ref 4.22–5.81)
RDW: 13.2 % (ref 11.5–15.5)
WBC: 5.2 10*3/uL (ref 4.0–10.5)
nRBC: 0 % (ref 0.0–0.2)

## 2019-09-11 MED ORDER — PROCHLORPERAZINE MALEATE 10 MG PO TABS
ORAL_TABLET | ORAL | Status: AC
  Filled 2019-09-11: qty 1

## 2019-09-11 MED ORDER — FAMOTIDINE IN NACL 20-0.9 MG/50ML-% IV SOLN
INTRAVENOUS | Status: AC
  Filled 2019-09-11: qty 50

## 2019-09-11 MED ORDER — FAMOTIDINE IN NACL 20-0.9 MG/50ML-% IV SOLN
20.0000 mg | Freq: Once | INTRAVENOUS | Status: AC
  Administered 2019-09-11: 20 mg via INTRAVENOUS

## 2019-09-11 MED ORDER — ACETAMINOPHEN 325 MG PO TABS
650.0000 mg | ORAL_TABLET | Freq: Once | ORAL | Status: AC
  Administered 2019-09-11: 650 mg via ORAL

## 2019-09-11 MED ORDER — SODIUM CHLORIDE 0.9 % IV SOLN
15.6000 mg/kg | Freq: Once | INTRAVENOUS | Status: AC
  Administered 2019-09-11: 1700 mg via INTRAVENOUS
  Filled 2019-09-11: qty 80

## 2019-09-11 MED ORDER — DIPHENHYDRAMINE HCL 25 MG PO CAPS
50.0000 mg | ORAL_CAPSULE | Freq: Once | ORAL | Status: AC
  Administered 2019-09-11: 50 mg via ORAL

## 2019-09-11 MED ORDER — METHYLPREDNISOLONE SODIUM SUCC 125 MG IJ SOLR
100.0000 mg | Freq: Once | INTRAMUSCULAR | Status: AC
  Administered 2019-09-11: 100 mg via INTRAVENOUS

## 2019-09-11 MED ORDER — MONTELUKAST SODIUM 10 MG PO TABS
10.0000 mg | ORAL_TABLET | Freq: Once | ORAL | Status: AC
  Administered 2019-09-11: 10 mg via ORAL

## 2019-09-11 MED ORDER — METHYLPREDNISOLONE SODIUM SUCC 125 MG IJ SOLR
INTRAMUSCULAR | Status: AC
  Filled 2019-09-11: qty 2

## 2019-09-11 MED ORDER — DEXTROSE 5 % IV SOLN
55.0000 mg/m2 | Freq: Once | INTRAVENOUS | Status: AC
  Administered 2019-09-11: 130 mg via INTRAVENOUS
  Filled 2019-09-11: qty 60

## 2019-09-11 MED ORDER — DIPHENHYDRAMINE HCL 25 MG PO CAPS
ORAL_CAPSULE | ORAL | Status: AC
  Filled 2019-09-11: qty 2

## 2019-09-11 MED ORDER — ZOLEDRONIC ACID 4 MG/100ML IV SOLN: 4.0000 mg | Freq: Once | INTRAVENOUS | Status: DC

## 2019-09-11 MED ORDER — SODIUM CHLORIDE 0.9 % IV SOLN
Freq: Once | INTRAVENOUS | Status: AC
  Administered 2019-09-11: 09:00:00 via INTRAVENOUS
  Filled 2019-09-11: qty 250

## 2019-09-11 MED ORDER — PROCHLORPERAZINE MALEATE 10 MG PO TABS
10.0000 mg | ORAL_TABLET | Freq: Once | ORAL | Status: AC
  Administered 2019-09-11: 10 mg via ORAL

## 2019-09-11 MED ORDER — MONTELUKAST SODIUM 10 MG PO TABS
ORAL_TABLET | ORAL | Status: AC
  Filled 2019-09-11: qty 1

## 2019-09-11 MED ORDER — ACETAMINOPHEN 325 MG PO TABS
ORAL_TABLET | ORAL | Status: AC
  Filled 2019-09-11: qty 2

## 2019-09-11 NOTE — Patient Instructions (Signed)
[facility] Discharge Instructions for Patients Receiving Chemotherapy  Today you received the following chemotherapy agents: Carfilzomib (Kyprolis) and Daratumumab (Darzalex)  To help prevent nausea and vomiting after your treatment, we encourage you to take your nausea medication as directed.   If you develop nausea and vomiting that is not controlled by your nausea medication, call the clinic.   BELOW ARE SYMPTOMS THAT SHOULD BE REPORTED IMMEDIATELY:  *FEVER GREATER THAN 100.5 F  *CHILLS WITH OR WITHOUT FEVER  NAUSEA AND VOMITING THAT IS NOT CONTROLLED WITH YOUR NAUSEA MEDICATION  *UNUSUAL SHORTNESS OF BREATH  *UNUSUAL BRUISING OR BLEEDING  TENDERNESS IN MOUTH AND THROAT WITH OR WITHOUT PRESENCE OF ULCERS  *URINARY PROBLEMS  *BOWEL PROBLEMS  UNUSUAL RASH Items with * indicate a potential emergency and should be followed up as soon as possible.  Feel free to call the clinic should you have any questions or concerns. The clinic phone number is [phone].  Please show the [facility] at check-in to the Emergency Department and triage nurse.

## 2019-09-11 NOTE — Progress Notes (Signed)
Zometa held today per order from Dr. Alvy Bimler. Ca 8.2. Pt encouraged to take calcium + Vit D as prescribed at home.

## 2019-09-23 NOTE — Progress Notes (Signed)
HEMATOLOGY/ONCOLOGY CLINIC NOTE  Date of Service: 09/24/19     Patient Care Team: [name], MD as PCP - General (Family Medicine) [name], MD as Consulting Physician (Hematology) [name], MD as Attending Physician (Radiation Oncology) [name], RN as Oncology Nurse Navigator (Oncology) [name] MD ([facility] _ primary oncology) phone number [phone] Email- [email or web address]  CHIEF COMPLAINTS  followup of multiple myeloma.  HISTORY OF PRESENTING ILLNESS:   Michael [name] is a wonderful 68 y.o. 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 Patient is currently on a study protocol and is being monitored for myeloma recurrence.  Based on available records being put together  -Patient was diagnosed with IgG kappa ISS stage I multiple myeloma with hyperdiploid complex cytogenetics in 2012. Bone marrow biopsy apparently showed 50% kappa restricted plasma cells with an initial M spike of 3.4 g/dL with evidence of lytic lesions on his PET/CT scan including right rib fracture and lesions of the lumbar and cervical spine. -Patient was treated under protocol 11-C-0221 with from 02/21/2013 with  Carfilzomib/Revlimid/Dexamethasone for a total of 8 cycles. He reports that his stem cells were collected and stored after 4 cycles -Posttreatment bone marrow biopsy showed less than 5% plasma cells with a negative flow cytometry and improvement in his previously PET avid lesions. Some concern for new focus of activity at C2 lytic lesion at L4 and several small lesions throughout the vertebrae. -Patient was on maintenance lenalidomide 10 mg by  mouth daily for 2 years or more until end of 2014. 08/19/2013 - bone marrow biopsy showed normocellular marrow with less than 5% plasma cells. PET CT scan showed decreased focal metabolic ligament prior rib lesions and no new lesions. Flow cytometry was negative for residual disease. Serum and urine electrophoresis and IFE showed no evidence of monoclonal protein. -Patient notes that he was off protocol for a few years due to lack of clinical trial research funding. -Patient notes that he is back on a follow-up protocol and continues to follow and receive his primary treatment at [facility] at this time. -Patient reports he had his last PET CT scan blood and urine tests and bone marrow examination in February 2018. The results of which are not available to [place] currently.  He notes that there was concern for a very slowly growing sternal FDG avid lesion that has been present for years. He has a follow-up at [facility] next month to determine the management of this lesion. He reports that was some consideration of considering radiation. he notes that this lesion has not been biopsied. No other focal bone pains at this time.  We discussed in detail about what our role will be and noted that we shall be available for any acute issues that need to be taken care of locally but that at this time the primary treatment and evaluation will be driven by his team at [facility] as per their research protocols and input. The patient and his wife are clearly aware of this and are in agreement with this plan.  INTERVAL HISTORY [name] is here for continued management of his Multiple Myeloma. We are joined by his wife Michael [name], via phone. The patient's last visit  with [place] was on 08/13/2019. The pt reports that he is doing well overall.  The pt reports that he is having some constipation at this time and is having less back pain. He is interested in moving forward with the transplant process but is concerned about doing so  at this time in light of the risks with the current pandemic. Pt is interested in receiving the Covid-19 vaccine when it becomes available. Pt saw his ENT, Dr. Redmond Baseman prior to his PET/CT due to having a very severe choking episode. Dr. Redmond Baseman did not find anything upon examination but is considering a Barium swallow study.   Of note since the patient's last visit, pt has had PET/CT scan ([id number]) completed on 09/03/2019 with results revealing "1. No evidence of active multiple myeloma on whole-body FDG PET scan. 2. Stable lytic lesions within the spine and sternum consistent treated metastasis. 3. No soft tissue plasmacytoma. 4. New compression fracture at T12 compared to PET-CT scan 10/02/2018."  Pt has had BM Bx Surgical Pathology ([id number]) completed on 08/16/2019 with results revealing "DIAGNOSIS: BONE MARROW, ASPIRATE, CLOT, CORE: -Hypercellular bone marrow for age with trilineage hematopoiesis and less than 1% plasma cells PERIPHERAL BLOOD: -Macrocytic anemia -Leukocytosis ".   Pt has had FISH Panel completed on 08/16/2019 with results revealing "No mutations detected."  Lab results today (09/24/19) of CBC w/diff and CMP is as follows: all values are WNL except for RBC at 3.75, Hgb at 12.5, HCT at 37.4, Glucose at 114, Total Protein at 6.2.  On review of systems, pt reports constipation, improved back pain and denies abdominal pain, leg swelling and any other symptoms.   MEDICAL HISTORY:  Past Medical History:  Diagnosis Date   Angioedema    CPAP (continuous positive airway pressure) dependence    uses at night   Gastroesophageal reflux disease without esophagitis    History of radiation therapy 06/04/18- 06/15/18   25 Gy in 10 fractions to the laryngeal/ cartilage lesion   Hyperlipidemia    Multiple myeloma ([place]) 2012   Treated at [facility]   Non morbid obesity due to excess calories    Prediabetes    Prediabetes    Unspecified glaucoma(365.9)    Urticaria, unspecified     Diabetes mellitus Glaucoma Left toes neuropathy  GERD  SURGICAL HISTORY: Past Surgical History:  Procedure Laterality Date   IR RADIOLOGIST EVAL & MGMT  11/13/2018   PORTACATH PLACEMENT     PORTACATH PLACEMENT     removed 02/2016    SOCIAL HISTORY: Social History   Socioeconomic History   Marital status: Married    Spouse name: Not on file   Number of children: 2   Years of education: Not on file   Highest education level: Not on file  Occupational History   Occupation: Retired Chief Strategy Officer for [employer] resource strain: Not on file   Food insecurity    Worry: Not on file    Inability: Not on file   Transportation needs    Medical: Not on file    Non-medical: Not on file  Tobacco Use   Smoking status: Never Smoker   Smokeless tobacco: Never Used  Substance and Sexual Activity   Alcohol use: Yes    Alcohol/week: 0.0 standard drinks    Comment: Occasional beer   Drug use: No   Sexual activity: Not on file  Lifestyle   Physical activity    Days per week: Not on file    Minutes  per session: Not on file   Stress: Not on file  Relationships   Social connections    Talks on phone: Not on file    Gets together: Not on file    Attends religious service: Not on file    Active member of club or organization: Not on file    Attends meetings of clubs or organizations: Not on file    Relationship status: Not on file   Intimate partner violence    Fear of current or ex partner: No    Emotionally abused: No    Physically abused: No    Forced sexual activity: No  Other Topics Concern   Not on file  Social History Narrative   Unable to asesss intmate partner violence- partner in room   Never smoker Married Retired from the [employer] in June 2017 and moved to [place].  FAMILY HISTORY: Family History  Problem Relation Age of Onset   Cancer Father     ALLERGIES:  has No Known  Allergies.  MEDICATIONS:  Current Outpatient Medications  Medication Sig Dispense Refill   acyclovir (ZOVIRAX) 400 MG tablet Take 1 tablet (400 mg total) by mouth 2 (two) times daily. 60 tablet 11   aspirin 81 MG tablet Take 81 mg by mouth daily.     atorvastatin (LIPITOR) 20 MG tablet Take 20 mg by mouth every evening.      Calcium Citrate-Vitamin D (CALCIUM CITRATE + D3 MAXIMUM PO) Take 630 mg by mouth.      dexamethasone (DECADRON) 4 MG tablet 44m (5 tabs) with breakfast the day after each daratumumab treatment 20 tablet 4   Multiple Vitamin (MULTIVITAMIN) tablet Take 1 tablet by mouth daily.     ondansetron (ZOFRAN) 8 MG tablet Take 1 tablet (8 mg total) by mouth 2 (two) times daily as needed (Nausea or vomiting). 30 tablet 1   oxyCODONE (OXY IR/ROXICODONE) 5 MG immediate release tablet Take 1-2 tablets (5-10 mg total) by mouth every 4 (four) hours as needed for severe pain or breakthrough pain. (Patient taking differently: Take 5-10 mg by mouth every 4 (four) hours as needed for severe pain or breakthrough pain. Patient reports taking only as needed - rarely) 60 tablet 0   pantoprazole (PROTONIX) 40 MG tablet Take 40 mg by mouth daily.      prochlorperazine (COMPAZINE) 10 MG tablet Take 1 tablet (10 mg total) by mouth every 6 (six) hours as needed (Nausea or vomiting). 30 tablet 1   senna-docusate (SENOKOT-S) 8.6-50 MG tablet Take 2 tablets by mouth at bedtime. Takes 50 mg 60 tablet 3   No current facility-administered medications for this visit.     REVIEW OF SYSTEMS:   A 10+ POINT REVIEW OF SYSTEMS WAS OBTAINED including neurology, dermatology, psychiatry, cardiac, respiratory, lymph, extremities, GI, GU, Musculoskeletal, constitutional, breasts, reproductive, HEENT.  All pertinent positives are noted in the HPI.  All others are negative.   PHYSICAL EXAMINATION: ECOG PERFORMANCE STATUS: 1 - Symptomatic but completely ambulatory  Vitals:   09/24/19 1601  BP: (!) 157/76   Pulse: 67  Resp: 18  Temp: 98.1 F (36.7 C)  TempSrc: Temporal  SpO2: 100%  Weight: 244 lb 11.2 oz (111 kg)  Height: 6' 1"  (1.854 m)   Body mass index is 32.28 kg/m.   GENERAL:alert, in no acute distress and comfortable SKIN: no acute rashes, no significant lesions EYES: conjunctiva are pink and non-injected, sclera anicteric OROPHARYNX: MMM, no exudates, no oropharyngeal erythema or ulceration NECK:  supple, no JVD LYMPH:  no palpable lymphadenopathy in the cervical, axillary or inguinal regions LUNGS: clear to auscultation b/l with normal respiratory effort HEART: regular rate & rhythm ABDOMEN:  normoactive bowel sounds , non tender, not distended. No palpable hepatosplenomegaly.  Extremity: no pedal edema PSYCH: alert & oriented x 3 with fluent speech NEURO: no focal motor/sensory deficits  LABORATORY DATA:  I have reviewed the data as listed . CBC Latest Ref Rng & Units 09/24/2019 09/11/2019 08/28/2019  WBC 4.0 - 10.5 K/uL 5.1 5.2 5.0  Hemoglobin 13.0 - 17.0 g/dL 12.5(L) 12.6(L) 12.6(L)  Hematocrit 39.0 - 52.0 % 37.4(L) 36.7(L) 37.4(L)  Platelets 150 - 400 K/uL 244 189 221    . CMP Latest Ref Rng & Units 09/24/2019 09/11/2019 08/28/2019  Glucose 70 - 99 mg/dL 114(H) 119(H) 120(H)  BUN 8 - 23 mg/dL 8 9 13   Creatinine 0.61 - 1.24 mg/dL 0.93 0.77 0.83  Sodium 135 - 145 mmol/L 143 142 139  Potassium 3.5 - 5.1 mmol/L 3.7 3.5 4.0  Chloride 98 - 111 mmol/L 105 109 104  CO2 22 - 32 mmol/L 29 22 25   Calcium 8.9 - 10.3 mg/dL 9.0 8.2(L) 9.2  Total Protein 6.5 - 8.1 g/dL 6.2(L) 6.2(L) 6.1(L)  Total Bilirubin 0.3 - 1.2 mg/dL 1.2 1.4(H) 1.0  Alkaline Phos 38 - 126 U/L 62 77 67  AST 15 - 41 U/L 23 14(L) 13(L)  ALT 0 - 44 U/L 21 18 15    08/16/2019 BM Bx Report ([id number]):   08/16/2019 FISH Panel:      05/08/18 Left Neck Thyroid Cartilage Biopsy:        RADIOGRAPHIC STUDIES: I have personally reviewed the radiological images as listed and agreed with the  findings in the report. Nm Pet Image Restage (ps) Whole Body  Result Date: 09/03/2019 CLINICAL DATA:  Subsequent treatment strategy for multiple myeloma. EXAM: NUCLEAR MEDICINE PET WHOLE BODY TECHNIQUE: 11.8 mCi F-18 FDG was injected intravenously. Full-ring PET imaging was performed from the skull base to thigh after the radiotracer. CT data was obtained and used for attenuation correction and anatomic localization. Fasting blood glucose: 121 mg/dl COMPARISON:  PET-CT 10/02/2018, biopsy CT 08/16/2019 FINDINGS: Mediastinal blood pool activity: SUV max 2.6 HEAD/NECK: No hypermetabolic activity in the scalp. No hypermetabolic cervical lymph nodes. Incidental CT findings: none CHEST: No hypermetabolic mediastinal or hilar nodes. No suspicious pulmonary nodules on the CT scan. Incidental CT findings: Calcified RIGHT hilar lymph nodes. ABDOMEN/PELVIS: No abnormal hypermetabolic activity within the liver, pancreas, adrenal glands, or spleen. No hypermetabolic lymph nodes in the abdomen or pelvis. Incidental CT findings: Prostate normal. Atherosclerotic calcification of the aorta. SKELETON: No foci of hypermetabolic activity to suggest active myeloma. Lytic lesions are noted in the sternum. Pathologic fracture in the lower thoracic spine (T12) is is new from most recent PET-CT scan 10/02/2018 Incidental CT findings: No new lytic lesions identified. EXTREMITIES: No abnormal hypermetabolic activity in the lower extremities. Incidental CT findings: none IMPRESSION: 1. No evidence of active multiple myeloma on whole-body FDG PET scan. 2. Stable lytic lesions within the spine and sternum consistent treated metastasis. 3. No soft tissue plasmacytoma. 4. New compression fracture at T12 compared to PET-CT scan 10/02/2018 Electronically Signed   By: Suzy Bouchard M.D.   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 Bone marrow biopsy apparently showed 50% kappa restricted plasma cells with an initial M spike of 3.4 g/dL with evidence of lytic lesions on his PET/CT scan including right rib fracture and lesions of the lumbar and cervical spine. -Patient was treated under protocol 11-C-0221 with from 02/21/2013 with  Carfilzomib/Revlimid/Dexamethasone for a total of 8 cycles. He reports that his stem cells were collected and stored after 4 cycles -Posttreatment bone marrow biopsy showed less than 5% plasma cells with a negative flow cytometry and improvement in his previously PET avid lesions. Some concern for new focus of activity at C2 lytic lesion at L4 and several small lesions throughout the vertebrae. -Patient was on maintenance lenalidomide 10 mg by mouth daily for 2 years or more until end of 2014. 08/19/2013 - bone marrow biopsy showed normocellular marrow with less than 5% plasma cells. PET CT scan showed decreased focal metabolic ligament prior rib lesions and no new lesions. Flow cytometry was negative for residual disease. Serum and urine electrophoresis and IFE showed no evidence of monoclonal protein. -Patient notes that he was off protocol for a few years due to lack of clinical trial research funding. -Patient returned to follow-up protocol and began to follow and receive his primary treatment at [facility]  -Patient's labs show minimal IgG kappa protein on IFE and a CT chest in May 2018 that did not show any overt signs of myeloma progression in the bones.  Rpt Myeloma labs 03/2017 - M spike of 0.3g/dl with a repeat M spike stable at 0.3 g/dL on 05/23/2017 which remains stable on labs today 07/25/17 Serum kappa lambda free light chain ratio not significantly changed. PET/CT scan showed a single metabolically active sternal lesion without any overt bony destruction. Bone marrow biopsy done on 06/06/2017 - shows no overt  involvement with multiple myeloma.  04/24/18 CT Soft Tissue Neck revealed Left laryngeal 3 cm soft tissue mass appears to have originated within and is expanding the left thyroid cartilage. This is new since the August 2018 PET-CT, and there is absent lymphadenopathy. 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 Primary cartilaginous neoplasm, squamous cell carcinoma, and other differential considerations are less likely. Superimposed widespread multiple myeloma lesions in the visible skeleton.  05/08/18 Left neck thyroid cartilage biopsy revealed Plasma cell neoplasm.  05/22/18 PET/CT revealed Soft tissue mass centered around the destroyed left thyroid cartilage is hypermetabolic and consistent with known plasmacytoma. No adenopathy in the neck. 2. Hypermetabolic 3 cm cutaneous and subcutaneous soft tissue mass involving the posterior upper thorax suspicious for cutaneous manifestation of myeloma (plasmacytoma). 3. Persistent and slightly progressive hypermetabolic sternal lesion. 4. Diffuse lytic myelomatous lesions throughout the bony structures but no other hypermetabolic foci. 5. Focus of hypermetabolism in the left aspect of the lower prostate gland, not present on the prior study. This could be an area of infection/inflammation or potential prostate cancer. Recommend correlation with physical examination and PSA level.    Completed RT between 06/04/18 and 06/15/18 of 25 Gy by 10 fractions  The pt pursued a clinical trial at the [facility] with RT and immunotherapy in October 2019 through October 18, 2018 with further information available at [email or web address]  10/18/18 M Protein increased to 3.5g, from 0.7g at our last visit on 07/10/18. 10/02/18 PET/CT from [facility] indicated new hypermetabolism in left clavicle, left scapula, bilateral ribs, spine, and left proximal femur 10/18/18 MRI from [facility] ndicated a pathologic fracture at T12  10/2018 M Protein at  4.1g  11/23/18 NM  Bone revealed Patchy uptake throughout the ribs and spine with areas of focally increased activity in both T12 pedicles and the right L1 pedicle. This focal activity could be stress related without clear corresponding fracture on available prior studies.  2) h/o Elevated bilirubin level . ? revlmid vs [place]  3) PET/CT abnormality in the Prostate. PSA levels WNL -Primary care physician to consider urology referral  4) Abnormal focus of FDG avid at a spot in the liver - will need to be monitor on f/u scans. No pain or other symptoms at this time.  PLAN:  -Discussed pt labwork today, 09/24/19; all values are WNL except for RBC at 3.75, Hgb at 12.5, HCT at 37.4, Glucose at 114, Total Protein at 6.2. -08/28/2019 M protein stable at 0.2 g/dL - could be from Daratumumab  -Discussed 09/03/2019 PET/CT scan ([id number]) which revealed  "1. No evidence of active multiple myeloma on whole-body FDG PET scan. 2. Stable lytic lesions within the spine and sternum consistent treated metastasis. 3. No soft tissue plasmacytoma. 4. New compression fracture at T12 compared to PET-CT scan 10/02/2018." -Discussed 08/16/2019 BM Bx Surgical Pathology [phone]) which revealed  "DIAGNOSIS: BONE MARROW, ASPIRATE, CLOT, CORE: -Hypercellular bone marrow for age with trilineage hematopoiesis and less than 1% plasma cells PERIPHERAL BLOOD: -Macrocytic anemia -Leukocytosis ".  -Discussed 08/16/2019 FISH Panel which revealed "No mutations detected." -Continue Zometa every 3 months  -Continue Acyclovir -Continue Vitamin D/Calcium supplement -Pt is interested in a transplant but would like to wait for the pandemic to be over before beginning the process  -Will continue Carfilzomib every 2 weeks for maintenance  -Will give 56 mg/m^2 of Carfilzomib for C12D15, will consider increasing to 70 mg/m^2 for C14D1 -The pt has no prohibitive toxicities from continuing C12D15 of Carfilzomib at this time.  -Will  see back in 1 month with labs   FOLLOW UP: -Switching to -Carfilzomib maintenance from tomorrow q2weeks with labs  -MD visit in 4 weeks  The total time spent in the appt was 25 minutes and more than 50% was on counseling and direct patient cares.  All of the patient's questions were answered with apparent satisfaction. The patient knows to call the clinic with any problems, questions or concerns.    Sullivan Lone MD [place] AAHIVMS [facility] Hematology/Oncology Physician [facility]  (Office):       [phone] (Work cell):  [phone] (Fax):           [phone]  I, Yevette Edwards, am acting as a scribe for Dr. Sullivan Lone.   .I have reviewed the above documentation for accuracy and completeness, and I agree with the above. [name] MD

## 2019-09-24 ENCOUNTER — Other Ambulatory Visit: Payer: Self-pay

## 2019-09-24 ENCOUNTER — Inpatient Hospital Stay: Payer: Medicare Other | Attending: Hematology | Admitting: Hematology

## 2019-09-24 ENCOUNTER — Inpatient Hospital Stay: Payer: Medicare Other

## 2019-09-24 VITALS — BP 157/76 | HR 67 | Temp 98.1°F | Resp 18 | Ht 73.0 in | Wt 244.7 lb

## 2019-09-24 DIAGNOSIS — Z79899 Other long term (current) drug therapy: Secondary | ICD-10-CM | POA: Diagnosis not present

## 2019-09-24 DIAGNOSIS — E119 Type 2 diabetes mellitus without complications: Secondary | ICD-10-CM | POA: Diagnosis not present

## 2019-09-24 DIAGNOSIS — C9002 Multiple myeloma in relapse: Secondary | ICD-10-CM | POA: Diagnosis not present

## 2019-09-24 DIAGNOSIS — C9000 Multiple myeloma not having achieved remission: Secondary | ICD-10-CM

## 2019-09-24 DIAGNOSIS — Z5112 Encounter for antineoplastic immunotherapy: Secondary | ICD-10-CM | POA: Insufficient documentation

## 2019-09-24 DIAGNOSIS — Z7982 Long term (current) use of aspirin: Secondary | ICD-10-CM | POA: Diagnosis not present

## 2019-09-24 DIAGNOSIS — E785 Hyperlipidemia, unspecified: Secondary | ICD-10-CM | POA: Insufficient documentation

## 2019-09-24 DIAGNOSIS — E559 Vitamin D deficiency, unspecified: Secondary | ICD-10-CM

## 2019-09-24 LAB — CBC WITH DIFFERENTIAL/PLATELET
Abs Immature Granulocytes: 0.02 10*3/uL (ref 0.00–0.07)
Basophils Absolute: 0 10*3/uL (ref 0.0–0.1)
Basophils Relative: 1 %
Eosinophils Absolute: 0.1 10*3/uL (ref 0.0–0.5)
Eosinophils Relative: 2 %
HCT: 37.4 % — ABNORMAL LOW (ref 39.0–52.0)
Hemoglobin: 12.5 g/dL — ABNORMAL LOW (ref 13.0–17.0)
Immature Granulocytes: 0 %
Lymphocytes Relative: 14 %
Lymphs Abs: 0.7 10*3/uL (ref 0.7–4.0)
MCH: 33.3 pg (ref 26.0–34.0)
MCHC: 33.4 g/dL (ref 30.0–36.0)
MCV: 99.7 fL (ref 80.0–100.0)
Monocytes Absolute: 0.4 10*3/uL (ref 0.1–1.0)
Monocytes Relative: 8 %
Neutro Abs: 3.8 10*3/uL (ref 1.7–7.7)
Neutrophils Relative %: 75 %
Platelets: 244 10*3/uL (ref 150–400)
RBC: 3.75 MIL/uL — ABNORMAL LOW (ref 4.22–5.81)
RDW: 13.5 % (ref 11.5–15.5)
WBC: 5.1 10*3/uL (ref 4.0–10.5)
nRBC: 0 % (ref 0.0–0.2)

## 2019-09-24 LAB — CMP (CANCER CENTER ONLY)
ALT: 21 U/L (ref 0–44)
AST: 23 U/L (ref 15–41)
Albumin: 4 g/dL (ref 3.5–5.0)
Alkaline Phosphatase: 62 U/L (ref 38–126)
Anion gap: 9 (ref 5–15)
BUN: 8 mg/dL (ref 8–23)
CO2: 29 mmol/L (ref 22–32)
Calcium: 9 mg/dL (ref 8.9–10.3)
Chloride: 105 mmol/L (ref 98–111)
Creatinine: 0.93 mg/dL (ref 0.61–1.24)
GFR, Est AFR Am: >60 mL/min (ref >60)
GFR, Estimated: >60 mL/min (ref >60)
Glucose, Bld: 114 mg/dL — ABNORMAL HIGH (ref 70–99)
Potassium: 3.7 mmol/L (ref 3.5–5.1)
Sodium: 143 mmol/L (ref 135–145)
Total Bilirubin: 1.2 mg/dL (ref 0.3–1.2)
Total Protein: 6.2 g/dL — ABNORMAL LOW (ref 6.5–8.1)

## 2019-09-25 ENCOUNTER — Other Ambulatory Visit: Payer: Medicare Other

## 2019-09-25 ENCOUNTER — Other Ambulatory Visit: Payer: Self-pay

## 2019-09-25 ENCOUNTER — Inpatient Hospital Stay: Payer: Medicare Other

## 2019-09-25 VITALS — BP 141/71 | HR 66 | Temp 98.9°F | Resp 19

## 2019-09-25 DIAGNOSIS — Z5112 Encounter for antineoplastic immunotherapy: Secondary | ICD-10-CM | POA: Diagnosis not present

## 2019-09-25 DIAGNOSIS — Z7189 Other specified counseling: Secondary | ICD-10-CM

## 2019-09-25 DIAGNOSIS — C9000 Multiple myeloma not having achieved remission: Secondary | ICD-10-CM

## 2019-09-25 MED ORDER — ACETAMINOPHEN 325 MG PO TABS
ORAL_TABLET | ORAL | Status: AC
  Filled 2019-09-25: qty 2

## 2019-09-25 MED ORDER — METHYLPREDNISOLONE SODIUM SUCC 125 MG IJ SOLR
60.0000 mg | Freq: Once | INTRAMUSCULAR | Status: AC
  Administered 2019-09-25: 60 mg via INTRAVENOUS

## 2019-09-25 MED ORDER — PROCHLORPERAZINE MALEATE 10 MG PO TABS
ORAL_TABLET | ORAL | Status: AC
  Filled 2019-09-25: qty 1

## 2019-09-25 MED ORDER — DIPHENHYDRAMINE HCL 25 MG PO CAPS
50.0000 mg | ORAL_CAPSULE | Freq: Once | ORAL | Status: AC
  Administered 2019-09-25: 09:00:00 50 mg via ORAL

## 2019-09-25 MED ORDER — DEXTROSE 5 % IV SOLN
55.0000 mg/m2 | Freq: Once | INTRAVENOUS | Status: AC
  Administered 2019-09-25: 130 mg via INTRAVENOUS
  Filled 2019-09-25: qty 60

## 2019-09-25 MED ORDER — PROCHLORPERAZINE MALEATE 10 MG PO TABS
10.0000 mg | ORAL_TABLET | Freq: Once | ORAL | Status: AC
  Administered 2019-09-25: 09:00:00 10 mg via ORAL

## 2019-09-25 MED ORDER — SODIUM CHLORIDE 0.9 % IV SOLN
Freq: Once | INTRAVENOUS | Status: AC
  Administered 2019-09-25: 09:00:00 via INTRAVENOUS
  Filled 2019-09-25: qty 250

## 2019-09-25 MED ORDER — DIPHENHYDRAMINE HCL 25 MG PO CAPS
ORAL_CAPSULE | ORAL | Status: AC
  Filled 2019-09-25: qty 2

## 2019-09-25 MED ORDER — ACETAMINOPHEN 325 MG PO TABS
650.0000 mg | ORAL_TABLET | Freq: Once | ORAL | Status: AC
  Administered 2019-09-25: 09:00:00 650 mg via ORAL

## 2019-09-25 MED ORDER — METHYLPREDNISOLONE SODIUM SUCC 125 MG IJ SOLR
INTRAMUSCULAR | Status: AC
  Filled 2019-09-25: qty 2

## 2019-09-25 NOTE — Patient Instructions (Signed)
[facility] Discharge Instructions for Patients Receiving Chemotherapy  Today you received the following chemotherapy agents:  Kyprolis  To help prevent nausea and vomiting after your treatment, we encourage you to take your nausea medication as directed.   If you develop nausea and vomiting that is not controlled by your nausea medication, call the clinic.   BELOW ARE SYMPTOMS THAT SHOULD BE REPORTED IMMEDIATELY:  *FEVER GREATER THAN 100.5 F  *CHILLS WITH OR WITHOUT FEVER  NAUSEA AND VOMITING THAT IS NOT CONTROLLED WITH YOUR NAUSEA MEDICATION  *UNUSUAL SHORTNESS OF BREATH  *UNUSUAL BRUISING OR BLEEDING  TENDERNESS IN MOUTH AND THROAT WITH OR WITHOUT PRESENCE OF ULCERS  *URINARY PROBLEMS  *BOWEL PROBLEMS  UNUSUAL RASH Items with * indicate a potential emergency and should be followed up as soon as possible.  Feel free to call the clinic should you have any questions or concerns. The clinic phone number is [phone].  Please show the CHEMO ALERT CARD at check-in to the Emergency Department and triage nurse.   

## 2019-09-26 ENCOUNTER — Telehealth: Payer: Self-pay | Admitting: Hematology

## 2019-09-26 NOTE — Telephone Encounter (Signed)
Scheduled appt per 12/9 los.  Spoke with pt and he is aware of the appt dates and time,

## 2019-10-09 ENCOUNTER — Other Ambulatory Visit: Payer: Self-pay | Admitting: Hematology

## 2019-10-09 ENCOUNTER — Other Ambulatory Visit: Payer: Self-pay

## 2019-10-09 ENCOUNTER — Inpatient Hospital Stay: Payer: Medicare Other

## 2019-10-09 VITALS — BP 150/74 | HR 99 | Temp 98.2°F | Resp 18 | Wt 244.5 lb

## 2019-10-09 DIAGNOSIS — Z7189 Other specified counseling: Secondary | ICD-10-CM

## 2019-10-09 DIAGNOSIS — C9000 Multiple myeloma not having achieved remission: Secondary | ICD-10-CM

## 2019-10-09 DIAGNOSIS — E559 Vitamin D deficiency, unspecified: Secondary | ICD-10-CM

## 2019-10-09 DIAGNOSIS — Z5112 Encounter for antineoplastic immunotherapy: Secondary | ICD-10-CM

## 2019-10-09 LAB — CMP (CANCER CENTER ONLY)
ALT: 16 U/L (ref 0–44)
AST: 16 U/L (ref 15–41)
Albumin: 4.1 g/dL (ref 3.5–5.0)
Alkaline Phosphatase: 61 U/L (ref 38–126)
Anion gap: 10 (ref 5–15)
BUN: 13 mg/dL (ref 8–23)
CO2: 26 mmol/L (ref 22–32)
Calcium: 8.9 mg/dL (ref 8.9–10.3)
Chloride: 105 mmol/L (ref 98–111)
Creatinine: 0.79 mg/dL (ref 0.61–1.24)
GFR, Est AFR Am: >60 mL/min (ref >60)
GFR, Estimated: >60 mL/min (ref >60)
Glucose, Bld: 119 mg/dL — ABNORMAL HIGH (ref 70–99)
Potassium: 3.7 mmol/L (ref 3.5–5.1)
Sodium: 141 mmol/L (ref 135–145)
Total Bilirubin: 1.1 mg/dL (ref 0.3–1.2)
Total Protein: 6.3 g/dL — ABNORMAL LOW (ref 6.5–8.1)

## 2019-10-09 LAB — CBC WITH DIFFERENTIAL/PLATELET
Abs Immature Granulocytes: 0.01 10*3/uL (ref 0.00–0.07)
Basophils Absolute: 0.1 10*3/uL (ref 0.0–0.1)
Basophils Relative: 2 %
Eosinophils Absolute: 0.1 10*3/uL (ref 0.0–0.5)
Eosinophils Relative: 4 %
HCT: 38.9 % — ABNORMAL LOW (ref 39.0–52.0)
Hemoglobin: 13 g/dL (ref 13.0–17.0)
Immature Granulocytes: 0 %
Lymphocytes Relative: 14 %
Lymphs Abs: 0.5 10*3/uL — ABNORMAL LOW (ref 0.7–4.0)
MCH: 33.4 pg (ref 26.0–34.0)
MCHC: 33.4 g/dL (ref 30.0–36.0)
MCV: 100 fL (ref 80.0–100.0)
Monocytes Absolute: 0.5 10*3/uL (ref 0.1–1.0)
Monocytes Relative: 12 %
Neutro Abs: 2.6 10*3/uL (ref 1.7–7.7)
Neutrophils Relative %: 68 %
Platelets: 210 10*3/uL (ref 150–400)
RBC: 3.89 MIL/uL — ABNORMAL LOW (ref 4.22–5.81)
RDW: 12.9 % (ref 11.5–15.5)
WBC: 3.9 10*3/uL — ABNORMAL LOW (ref 4.0–10.5)
nRBC: 0 % (ref 0.0–0.2)

## 2019-10-09 LAB — VITAMIN D 25 HYDROXY (VIT D DEFICIENCY, FRACTURES): Vit D, 25-Hydroxy: 35.05 ng/mL (ref 30–100)

## 2019-10-09 MED ORDER — ACETAMINOPHEN 325 MG PO TABS
650.0000 mg | ORAL_TABLET | Freq: Once | ORAL | Status: AC
  Administered 2019-10-09: 650 mg via ORAL

## 2019-10-09 MED ORDER — ACETAMINOPHEN 325 MG PO TABS
ORAL_TABLET | ORAL | Status: AC
  Filled 2019-10-09: qty 2

## 2019-10-09 MED ORDER — PROCHLORPERAZINE MALEATE 10 MG PO TABS
ORAL_TABLET | ORAL | Status: AC
  Filled 2019-10-09: qty 1

## 2019-10-09 MED ORDER — DIPHENHYDRAMINE HCL 25 MG PO CAPS
50.0000 mg | ORAL_CAPSULE | Freq: Once | ORAL | Status: AC
  Administered 2019-10-09: 50 mg via ORAL

## 2019-10-09 MED ORDER — METHYLPREDNISOLONE SODIUM SUCC 125 MG IJ SOLR
INTRAMUSCULAR | Status: AC
  Filled 2019-10-09: qty 2

## 2019-10-09 MED ORDER — DIPHENHYDRAMINE HCL 25 MG PO CAPS
ORAL_CAPSULE | ORAL | Status: AC
  Filled 2019-10-09: qty 2

## 2019-10-09 MED ORDER — SODIUM CHLORIDE 0.9 % IV SOLN
Freq: Once | INTRAVENOUS | Status: AC
  Filled 2019-10-09: qty 250

## 2019-10-09 MED ORDER — PROCHLORPERAZINE MALEATE 10 MG PO TABS
10.0000 mg | ORAL_TABLET | Freq: Once | ORAL | Status: AC
  Administered 2019-10-09: 10 mg via ORAL

## 2019-10-09 MED ORDER — METHYLPREDNISOLONE SODIUM SUCC 125 MG IJ SOLR
60.0000 mg | Freq: Once | INTRAMUSCULAR | Status: AC
  Administered 2019-10-09: 60 mg via INTRAVENOUS

## 2019-10-09 MED ORDER — DEXTROSE 5 % IV SOLN
55.0000 mg/m2 | Freq: Once | INTRAVENOUS | Status: AC
  Administered 2019-10-09: 130 mg via INTRAVENOUS
  Filled 2019-10-09: qty 60

## 2019-10-14 LAB — MULTIPLE MYELOMA PANEL, SERUM
Albumin SerPl Elph-Mcnc: 3.8 g/dL (ref 2.9–4.4)
Albumin/Glob SerPl: 1.9 — ABNORMAL HIGH (ref 0.7–1.7)
Alpha 1: 0.2 g/dL (ref 0.0–0.4)
Alpha2 Glob SerPl Elph-Mcnc: 0.6 g/dL (ref 0.4–1.0)
B-Globulin SerPl Elph-Mcnc: 0.9 g/dL (ref 0.7–1.3)
Gamma Glob SerPl Elph-Mcnc: 0.4 g/dL (ref 0.4–1.8)
Globulin, Total: 2.1 g/dL — ABNORMAL LOW (ref 2.2–3.9)
IgA: 6 mg/dL — ABNORMAL LOW (ref 61–437)
IgG (Immunoglobin G), Serum: 413 mg/dL — ABNORMAL LOW (ref 603–1613)
IgM (Immunoglobulin M), Srm: <5 mg/dL — ABNORMAL LOW (ref 20–172)
M Protein SerPl Elph-Mcnc: 0.2 g/dL — ABNORMAL HIGH
Total Protein ELP: 5.9 g/dL — ABNORMAL LOW (ref 6.0–8.5)

## 2019-10-21 ENCOUNTER — Other Ambulatory Visit: Payer: Self-pay | Admitting: Otolaryngology

## 2019-10-21 DIAGNOSIS — R1313 Dysphagia, pharyngeal phase: Secondary | ICD-10-CM

## 2019-10-22 NOTE — Progress Notes (Signed)
HEMATOLOGY/ONCOLOGY CLINIC NOTE  Date of Service: 10/23/19     Patient Care Team: [name], MD as PCP - General (Family Medicine) [name], MD as Consulting Physician (Hematology) [name], MD as Attending Physician (Radiation Oncology) [name], RN as Oncology Nurse Navigator (Oncology) [name] MD ([facility] _ primary oncology) phone number [phone] Email- [email or web address]  CHIEF COMPLAINTS  followup of multiple myeloma.  HISTORY OF PRESENTING ILLNESS:   Michael [name] is a wonderful 69 y.o. 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 Patient is currently on a study protocol and is being monitored for myeloma recurrence.  Based on available records being put together  -Patient was diagnosed with IgG kappa ISS stage I multiple myeloma with hyperdiploid complex cytogenetics in 2012. Bone marrow biopsy apparently showed 50% kappa restricted plasma cells with an initial M spike of 3.4 g/dL with evidence of lytic lesions on his PET/CT scan including right rib fracture and lesions of the lumbar and cervical spine. -Patient was treated under protocol 11-C-0221 with from 02/21/2013 with  Carfilzomib/Revlimid/Dexamethasone for a total of 8 cycles. He reports that his stem cells were collected and stored after 4 cycles -Posttreatment bone marrow biopsy showed less than 5% plasma cells with a negative flow cytometry and improvement in his previously PET avid lesions. Some concern for new focus of activity at C2 lytic lesion at L4 and several small lesions throughout the vertebrae. -Patient was on maintenance lenalidomide 10 mg by  mouth daily for 2 years or more until end of 2014. 08/19/2013 - bone marrow biopsy showed normocellular marrow with less than 5% plasma cells. PET CT scan showed decreased focal metabolic ligament prior rib lesions and no new lesions. Flow cytometry was negative for residual disease. Serum and urine electrophoresis and IFE showed no evidence of monoclonal protein. -Patient notes that he was off protocol for a few years due to lack of clinical trial research funding. -Patient notes that he is back on a follow-up protocol and continues to follow and receive his primary treatment at [facility] at this time. -Patient reports he had his last PET CT scan blood and urine tests and bone marrow examination in February 2018. The results of which are not available to [place] currently.  He notes that there was concern for a very slowly growing sternal FDG avid lesion that has been present for years. He has a follow-up at [facility] next month to determine the management of this lesion. He reports that was some consideration of considering radiation. he notes that this lesion has not been biopsied. No other focal bone pains at this time.  We discussed in detail about what our role will be and noted that we shall be available for any acute issues that need to be taken care of locally but that at this time the primary treatment and evaluation will be driven by his team at [facility] as per their research protocols and input. The patient and his wife are clearly aware of this and are in agreement with this plan.  INTERVAL HISTORY  Michael [name] is here for continued management of his Multiple Myeloma. He is here for C13D15 of maintenance Carfilzomib. The patient's last visit  with [place] was on 09/24/2019. The pt reports that he is doing well overall.  The pt reports that Dr. Redmond Baseman has ordered a Barium Swallowing Study for 10/28/19. He is having some trouble swallowing, which has been occurring for awhile now. Pt has been having  some discomfort taking his treatment intravenously but is not ready for a port placement. He is also interested in receiving the Covid-19 vaccine.   Lab results today (10/23/19) of CBC w/diff and CMP is as follows: all values are WNL except for RBC at 3.86, Hgb at 12.8, HCT at 37.7, Glucose at 112, Calcium at 8.7, Total Protein at 5.8, AST at 12. 10/23/2019 Vitamin D 25 hydroxy at 33.56  On review of systems, pt reports trouble swallowing and denies infection issues, leg swelling, new tingling/numbness in hands/feet. abdominal pain, new back pain and any other symptoms.    MEDICAL HISTORY:  Past Medical History:  Diagnosis Date  . Angioedema   . CPAP (continuous positive airway pressure) dependence    uses at night  . Gastroesophageal reflux disease without esophagitis   . History of radiation therapy 06/04/18- 06/15/18   25 Gy in 10 fractions to the laryngeal/ cartilage lesion  . Hyperlipidemia   . Multiple myeloma ([place]) 2012   Treated at [facility]  . Non morbid obesity due to excess calories   . Prediabetes   . Prediabetes   . Unspecified glaucoma(365.9)   . Urticaria, unspecified   Diabetes mellitus Glaucoma Left toes neuropathy  GERD  SURGICAL HISTORY: Past Surgical History:  Procedure Laterality Date  . IR RADIOLOGIST EVAL & MGMT  11/13/2018  . PORTACATH PLACEMENT    . PORTACATH PLACEMENT     removed 02/2016    SOCIAL HISTORY: Social History   Socioeconomic History  . Marital status: Married    Spouse name: Not on file  . Number of children: 2  . Years of education: Not on file  . Highest education level: Not on file  Occupational History  . Occupation: Retired Chief Strategy Officer for [employer]  . Smoking status: Never Smoker  . Smokeless tobacco: Never Used  Substance and Sexual Activity  . Alcohol use: Yes    Alcohol/week: 0.0 standard drinks    Comment: Occasional beer  . Drug use: No  . Sexual activity: Not on file  Other Topics Concern  . Not on file    Social History Narrative   Unable to [employer] partner violence- partner in room    Social Determinants of Health   Financial Resource Strain:   . Difficulty of Paying Living Expenses: Not on file  Food Insecurity:   . Worried About Charity fundraiser in the Last Year: Not on file  . Ran Out of Food in the Last Year: Not on file  Transportation Needs:   . Lack of Transportation (Medical): Not on file  . Lack of Transportation (Non-Medical): Not on file  Physical Activity:   . Days of Exercise per Week: Not on file  . Minutes of Exercise per Session: Not on file  Stress:   . Feeling of Stress : Not on file  Social Connections:   . Frequency of Communication with Friends and Family: Not on file  . Frequency of Social Gatherings with Friends and Family: Not on file  . Attends Religious Services: Not on file  . Active Member of Clubs or Organizations: Not on file  . Attends Archivist Meetings: Not on file  . Marital Status:  Not on file  Intimate Partner Violence:   . Fear of Current or Ex-Partner: Not on file  . Emotionally Abused: Not on file  . Physically Abused: Not on file  . Sexually Abused: Not on file  Never smoker Married Retired from the [employer] in June 2017 and moved to [facility].  FAMILY HISTORY: Family History  Problem Relation Age of Onset  . Cancer Father     ALLERGIES:  has No Known Allergies.  MEDICATIONS:  Current Outpatient Medications  Medication Sig Dispense Refill  . acyclovir (ZOVIRAX) 400 MG tablet Take 1 tablet (400 mg total) by mouth 2 (two) times daily. 60 tablet 11  . aspirin 81 MG tablet Take 81 mg by mouth daily.    [name] atorvastatin (LIPITOR) 20 MG tablet Take 20 mg by mouth every evening.     . Calcium Citrate-Vitamin D (CALCIUM CITRATE + D3 MAXIMUM PO) Take 630 mg by mouth.     . dexamethasone (DECADRON) 4 MG tablet 35m (5 tabs) with breakfast the day after each daratumumab treatment 20 tablet 4  .  Multiple Vitamin (MULTIVITAMIN) tablet Take 1 tablet by mouth daily.    . ondansetron (ZOFRAN) 8 MG tablet Take 1 tablet (8 mg total) by mouth 2 (two) times daily as needed (Nausea or vomiting). 30 tablet 1  . oxyCODONE (OXY IR/ROXICODONE) 5 MG immediate release tablet Take 1-2 tablets (5-10 mg total) by mouth every 4 (four) hours as needed for severe pain or breakthrough pain. (Patient taking differently: Take 5-10 mg by mouth every 4 (four) hours as needed for severe pain or breakthrough pain. Patient reports taking only as needed - rarely) 60 tablet 0  . pantoprazole (PROTONIX) 40 MG tablet Take 40 mg by mouth daily.     . prochlorperazine (COMPAZINE) 10 MG tablet Take 1 tablet (10 mg total) by mouth every 6 (six) hours as needed (Nausea or vomiting). 30 tablet 1  . senna-docusate (SENOKOT-S) 8.6-50 MG tablet Take 2 tablets by mouth at bedtime. Takes 50 mg 60 tablet 3   No current facility-administered medications for this visit.    REVIEW OF SYSTEMS:   A 10+ POINT REVIEW OF SYSTEMS WAS OBTAINED including neurology, dermatology, psychiatry, cardiac, respiratory, lymph, extremities, GI, GU, Musculoskeletal, constitutional, breasts, reproductive, HEENT.  All pertinent positives are noted in the HPI.  All others are negative.   PHYSICAL EXAMINATION: ECOG PERFORMANCE STATUS: 1 - Symptomatic but completely ambulatory  Vitals:   10/23/19 0852  BP: 125/66  Pulse: 62  Resp: 16  Temp: 98.3 F (36.8 C)  TempSrc: Temporal  SpO2: 99%  Weight: 245 lb 8 oz (111.4 kg)  Height: 6' 1"  (1.854 m)   Body mass index is 32.39 kg/m.   Exam was given in a chair   GENERAL:alert, in no acute distress and comfortable SKIN: no acute rashes, no significant lesions EYES: conjunctiva are pink and non-injected, sclera anicteric OROPHARYNX: MMM, no exudates, no oropharyngeal erythema or ulceration NECK: supple, no JVD LYMPH:  no palpable lymphadenopathy in the cervical, axillary or inguinal regions LUNGS:  clear to auscultation b/l with normal respiratory effort HEART: regular rate & rhythm ABDOMEN:  normoactive bowel sounds , non tender, not distended. No palpable hepatosplenomegaly.  Extremity: no pedal edema PSYCH: alert & oriented x 3 with fluent speech NEURO: no focal motor/sensory deficits  LABORATORY DATA:  I have reviewed the data as listed . CBC Latest Ref Rng & Units 10/23/2019 10/09/2019 09/24/2019  WBC 4.0 - 10.5 K/uL 4.3  3.9(L) 5.1  Hemoglobin 13.0 - 17.0 g/dL 12.8(L) 13.0 12.5(L)  Hematocrit 39.0 - 52.0 % 37.7(L) 38.9(L) 37.4(L)  Platelets 150 - 400 K/uL 197 210 244    . CMP Latest Ref Rng & Units 10/23/2019 10/09/2019 09/24/2019  Glucose 70 - 99 mg/dL 112(H) 119(H) 114(H)  BUN 8 - 23 mg/dL 12 13 8   Creatinine 0.61 - 1.24 mg/dL 0.81 0.79 0.93  Sodium 135 - 145 mmol/L 142 141 143  Potassium 3.5 - 5.1 mmol/L 3.8 3.7 3.7  Chloride 98 - 111 mmol/L 110 105 105  CO2 22 - 32 mmol/L 25 26 29   Calcium 8.9 - 10.3 mg/dL 8.7(L) 8.9 9.0  Total Protein 6.5 - 8.1 g/dL 5.8(L) 6.3(L) 6.2(L)  Total Bilirubin 0.3 - 1.2 mg/dL 0.6 1.1 1.2  Alkaline Phos 38 - 126 U/L 60 61 62  AST 15 - 41 U/L 12(L) 16 23  ALT 0 - 44 U/L 17 16 21    08/16/2019 BM Bx Report ([id number]):   08/16/2019 FISH Panel:      05/08/18 Left Neck Thyroid Cartilage Biopsy:        RADIOGRAPHIC STUDIES: I have personally reviewed the radiological images as listed and agreed with the findings in the report. No results found.  ASSESSMENT & PLAN:   69 y.o. 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 Bone marrow biopsy apparently showed 50% kappa restricted plasma cells with an initial M spike of 3.4 g/dL with evidence of lytic lesions on his PET/CT scan including right rib fracture and lesions of the lumbar and cervical spine. -Patient was treated under protocol 11-C-0221 with  from 02/21/2013 with  Carfilzomib/Revlimid/Dexamethasone for a total of 8 cycles. He reports that his stem cells were collected and stored after 4 cycles -Posttreatment bone marrow biopsy showed less than 5% plasma cells with a negative flow cytometry and improvement in his previously PET avid lesions. Some concern for new focus of activity at C2 lytic lesion at L4 and several small lesions throughout the vertebrae. -Patient was on maintenance lenalidomide 10 mg by mouth daily for 2 years or more until end of 2014. 08/19/2013 - bone marrow biopsy showed normocellular marrow with less than 5% plasma cells. PET CT scan showed decreased focal metabolic ligament prior rib lesions and no new lesions. Flow cytometry was negative for residual disease. Serum and urine electrophoresis and IFE showed no evidence of monoclonal protein. -Patient notes that he was off protocol for a few years due to lack of clinical trial research funding. -Patient returned to follow-up protocol and began to follow and receive his primary treatment at [facility]  -Patient's labs show minimal IgG kappa protein on IFE and a CT chest in May 2018 that did not show any overt signs of myeloma progression in the bones.  Rpt Myeloma labs 03/2017 - M spike of 0.3g/dl with a repeat M spike stable at 0.3 g/dL on 05/23/2017 which remains stable on labs today 07/25/17 Serum kappa lambda free light chain ratio not significantly changed. PET/CT scan showed a single metabolically active sternal lesion without any overt bony destruction. Bone marrow biopsy done on 06/06/2017 - shows no overt involvement with multiple myeloma.  04/24/18 CT Soft Tissue Neck revealed Left laryngeal 3 cm soft tissue mass appears to have originated within and is expanding the left thyroid cartilage. This is new since the August 2018 PET-CT, and there is absent  lymphadenopathy. 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 Primary cartilaginous  neoplasm, squamous cell carcinoma, and other differential considerations are less likely. Superimposed widespread multiple myeloma lesions in the visible skeleton.  05/08/18 Left neck thyroid cartilage biopsy revealed Plasma cell neoplasm.  05/22/18 PET/CT revealed Soft tissue mass centered around the destroyed left thyroid cartilage is hypermetabolic and consistent with known plasmacytoma. No adenopathy in the neck. 2. Hypermetabolic 3 cm cutaneous and subcutaneous soft tissue mass involving the posterior upper thorax suspicious for cutaneous manifestation of myeloma (plasmacytoma). 3. Persistent and slightly progressive hypermetabolic sternal lesion. 4. Diffuse lytic myelomatous lesions throughout the bony structures but no other hypermetabolic foci. 5. Focus of hypermetabolism in the left aspect of the lower prostate gland, not present on the prior study. This could be an area of infection/inflammation or potential prostate cancer. Recommend correlation with physical examination and PSA level.    Completed RT between 06/04/18 and 06/15/18 of 25 Gy by 10 fractions  The pt pursued a clinical trial at the [facility] with RT and immunotherapy in October 2019 through October 18, 2018 with further information available at [email or web address]  10/18/18 M Protein increased to 3.5g, from 0.7g at our last visit on 07/10/18. 10/02/18 PET/CT from [facility] indicated new hypermetabolism in left clavicle, left scapula, bilateral ribs, spine, and left proximal femur 10/18/18 MRI from [facility] ndicated a pathologic fracture at T12  10/2018 M Protein at 4.1g  11/23/18 NM Bone revealed Patchy uptake throughout the ribs and spine with areas of focally increased activity in both T12 pedicles and the right L1 pedicle. This focal activity could be stress related without clear corresponding fracture on available prior studies.  08/16/2019 FISH Panel which revealed "No mutations detected."  08/16/2019 [facility] Surgical  Pathology [phone]) which revealed  "DIAGNOSIS: BONE MARROW, ASPIRATE, CLOT, CORE: -Hypercellular bone marrow for age with trilineage hematopoiesis and less than 1% plasma cells PERIPHERAL BLOOD: -Macrocytic anemia -Leukocytosis ".  09/03/2019 PET/CT scan ([id number]) revealed  "1. No evidence of active multiple myeloma on whole-body FDG PET scan. 2. Stable lytic lesions within the spine and sternum consistent treated metastasis. 3. No soft tissue plasmacytoma. 4. New compression fracture at T12 compared to PET-CT scan 10/02/2018."  2) h/o Elevated bilirubin level . ? [facility]  3) PET/CT abnormality in the Prostate. PSA levels WNL -Primary care physician to consider urology referral  4) Abnormal focus of FDG avid at a spot in the liver - will need to be monitor on f/u scans. No pain or other symptoms at this time.  PLAN:  -Discussed pt labwork today, 10/23/19; blood counts and chemistries look good.  -Discussed 10/23/2019 Vitamin D 25 hydroxy at 33.56 -Discussed 10/09/19 M Protein is stable at 0.2 g/dL - could be from Daratumumab  -Continue Zometa every 3 months  -Continue Acyclovir -Continue Vitamin D/Calcium supplement -Pt is interested in a transplant but would like to wait for the pandemic to be over before beginning the process  -Advised pt that he is eligible for a Port-a-Cath placement - pt chooses to hold off at this time -Recommended pt receive Covid-19 vaccine when available -Will continue Carfilzomib every 2 weeks for maintenance  -Will give 56 mg/m^2 of Carfilzomib for C13D15, will increase to 70 mg/m^2 for C14D1 -The pt has no prohibitive toxicities from continuing C13D15 of Carfilzomib at this time.  -Will see back in 1 month with labs  FOLLOW UP: Please schedule next 4 doses of maintenance Carfilzomib q2weeks with labs  MD visit in  4 weeks Continue Zometa every 12 weeks   The total time spent in the appt was 30 minutes and more than 50% was on counseling and  direct patient cares.  All of the patient's questions were answered with apparent satisfaction. The patient knows to call the clinic with any problems, questions or concerns.    Sullivan Lone MD [place] [facility] Hematology/Oncology Physician [facility]  (Office):       [phone] (Work cell):  [phone] (Fax):           [phone]  I, Yevette Edwards, am acting as a scribe for Dr. Sullivan Lone.   .I have reviewed the above documentation for accuracy and completeness, and I agree with the above. [name] MD

## 2019-10-23 ENCOUNTER — Inpatient Hospital Stay: Payer: Medicare Other | Attending: Hematology

## 2019-10-23 ENCOUNTER — Other Ambulatory Visit: Payer: Self-pay

## 2019-10-23 ENCOUNTER — Inpatient Hospital Stay (HOSPITAL_BASED_OUTPATIENT_CLINIC_OR_DEPARTMENT_OTHER): Payer: Medicare Other | Admitting: Hematology

## 2019-10-23 ENCOUNTER — Inpatient Hospital Stay: Payer: Medicare Other

## 2019-10-23 ENCOUNTER — Other Ambulatory Visit: Payer: Self-pay | Admitting: Hematology

## 2019-10-23 VITALS — BP 125/66 | HR 62 | Temp 98.3°F | Resp 16 | Ht 73.0 in | Wt 245.5 lb

## 2019-10-23 DIAGNOSIS — H409 Unspecified glaucoma: Secondary | ICD-10-CM | POA: Diagnosis not present

## 2019-10-23 DIAGNOSIS — C9000 Multiple myeloma not having achieved remission: Secondary | ICD-10-CM | POA: Insufficient documentation

## 2019-10-23 DIAGNOSIS — Z5112 Encounter for antineoplastic immunotherapy: Secondary | ICD-10-CM | POA: Diagnosis not present

## 2019-10-23 DIAGNOSIS — M4854XA Collapsed vertebra, not elsewhere classified, thoracic region, initial encounter for fracture: Secondary | ICD-10-CM | POA: Insufficient documentation

## 2019-10-23 DIAGNOSIS — D72829 Elevated white blood cell count, unspecified: Secondary | ICD-10-CM | POA: Insufficient documentation

## 2019-10-23 DIAGNOSIS — D539 Nutritional anemia, unspecified: Secondary | ICD-10-CM | POA: Insufficient documentation

## 2019-10-23 DIAGNOSIS — K219 Gastro-esophageal reflux disease without esophagitis: Secondary | ICD-10-CM | POA: Insufficient documentation

## 2019-10-23 DIAGNOSIS — E119 Type 2 diabetes mellitus without complications: Secondary | ICD-10-CM | POA: Insufficient documentation

## 2019-10-23 DIAGNOSIS — Z7189 Other specified counseling: Secondary | ICD-10-CM

## 2019-10-23 DIAGNOSIS — Z79899 Other long term (current) drug therapy: Secondary | ICD-10-CM | POA: Diagnosis not present

## 2019-10-23 DIAGNOSIS — Z7982 Long term (current) use of aspirin: Secondary | ICD-10-CM | POA: Diagnosis not present

## 2019-10-23 DIAGNOSIS — E785 Hyperlipidemia, unspecified: Secondary | ICD-10-CM | POA: Diagnosis not present

## 2019-10-23 DIAGNOSIS — E559 Vitamin D deficiency, unspecified: Secondary | ICD-10-CM

## 2019-10-23 LAB — CBC WITH DIFFERENTIAL/PLATELET
Abs Immature Granulocytes: 0.02 10*3/uL (ref 0.00–0.07)
Basophils Absolute: 0.1 10*3/uL (ref 0.0–0.1)
Basophils Relative: 1 %
Eosinophils Absolute: 0.2 10*3/uL (ref 0.0–0.5)
Eosinophils Relative: 4 %
HCT: 37.7 % — ABNORMAL LOW (ref 39.0–52.0)
Hemoglobin: 12.8 g/dL — ABNORMAL LOW (ref 13.0–17.0)
Immature Granulocytes: 1 %
Lymphocytes Relative: 16 %
Lymphs Abs: 0.7 10*3/uL (ref 0.7–4.0)
MCH: 33.2 pg (ref 26.0–34.0)
MCHC: 34 g/dL (ref 30.0–36.0)
MCV: 97.7 fL (ref 80.0–100.0)
Monocytes Absolute: 0.5 10*3/uL (ref 0.1–1.0)
Monocytes Relative: 13 %
Neutro Abs: 2.8 10*3/uL (ref 1.7–7.7)
Neutrophils Relative %: 65 %
Platelets: 197 10*3/uL (ref 150–400)
RBC: 3.86 MIL/uL — ABNORMAL LOW (ref 4.22–5.81)
RDW: 12.7 % (ref 11.5–15.5)
WBC: 4.3 10*3/uL (ref 4.0–10.5)
nRBC: 0 % (ref 0.0–0.2)

## 2019-10-23 LAB — CMP (CANCER CENTER ONLY)
ALT: 17 U/L (ref 0–44)
AST: 12 U/L — ABNORMAL LOW (ref 15–41)
Albumin: 3.9 g/dL (ref 3.5–5.0)
Alkaline Phosphatase: 60 U/L (ref 38–126)
Anion gap: 7 (ref 5–15)
BUN: 12 mg/dL (ref 8–23)
CO2: 25 mmol/L (ref 22–32)
Calcium: 8.7 mg/dL — ABNORMAL LOW (ref 8.9–10.3)
Chloride: 110 mmol/L (ref 98–111)
Creatinine: 0.81 mg/dL (ref 0.61–1.24)
GFR, Est AFR Am: >60 mL/min (ref >60)
GFR, Estimated: >60 mL/min (ref >60)
Glucose, Bld: 112 mg/dL — ABNORMAL HIGH (ref 70–99)
Potassium: 3.8 mmol/L (ref 3.5–5.1)
Sodium: 142 mmol/L (ref 135–145)
Total Bilirubin: 0.6 mg/dL (ref 0.3–1.2)
Total Protein: 5.8 g/dL — ABNORMAL LOW (ref 6.5–8.1)

## 2019-10-23 LAB — VITAMIN D 25 HYDROXY (VIT D DEFICIENCY, FRACTURES): Vit D, 25-Hydroxy: 33.56 ng/mL (ref 30–100)

## 2019-10-23 MED ORDER — SODIUM CHLORIDE 0.9 % IV SOLN
Freq: Once | INTRAVENOUS | Status: AC
  Filled 2019-10-23: qty 250

## 2019-10-23 MED ORDER — METHYLPREDNISOLONE SODIUM SUCC 125 MG IJ SOLR
60.0000 mg | Freq: Once | INTRAMUSCULAR | Status: AC
  Administered 2019-10-23: 60 mg via INTRAVENOUS

## 2019-10-23 MED ORDER — DEXTROSE 5 % IV SOLN
55.0000 mg/m2 | Freq: Once | INTRAVENOUS | Status: AC
  Administered 2019-10-23: 130 mg via INTRAVENOUS
  Filled 2019-10-23: qty 65

## 2019-10-23 MED ORDER — ACETAMINOPHEN 325 MG PO TABS
650.0000 mg | ORAL_TABLET | Freq: Once | ORAL | Status: AC
  Administered 2019-10-23: 650 mg via ORAL

## 2019-10-23 MED ORDER — PROCHLORPERAZINE MALEATE 10 MG PO TABS
ORAL_TABLET | ORAL | Status: AC
  Filled 2019-10-23: qty 1

## 2019-10-23 MED ORDER — PROCHLORPERAZINE MALEATE 10 MG PO TABS
10.0000 mg | ORAL_TABLET | Freq: Once | ORAL | Status: AC
  Administered 2019-10-23: 10 mg via ORAL

## 2019-10-23 MED ORDER — ACETAMINOPHEN 325 MG PO TABS
ORAL_TABLET | ORAL | Status: AC
  Filled 2019-10-23: qty 2

## 2019-10-23 MED ORDER — METHYLPREDNISOLONE SODIUM SUCC 125 MG IJ SOLR
INTRAMUSCULAR | Status: AC
  Filled 2019-10-23: qty 2

## 2019-10-23 MED ORDER — DIPHENHYDRAMINE HCL 25 MG PO CAPS
50.0000 mg | ORAL_CAPSULE | Freq: Once | ORAL | Status: AC
  Administered 2019-10-23: 50 mg via ORAL

## 2019-10-23 MED ORDER — DIPHENHYDRAMINE HCL 25 MG PO CAPS
ORAL_CAPSULE | ORAL | Status: AC
  Filled 2019-10-23: qty 2

## 2019-10-23 NOTE — Patient Instructions (Signed)
[facility] Discharge Instructions for Patients Receiving Chemotherapy  Today you received the following chemotherapy agents:  Kyprolis  To help prevent nausea and vomiting after your treatment, we encourage you to take your nausea medication as directed.   If you develop nausea and vomiting that is not controlled by your nausea medication, call the clinic.   BELOW ARE SYMPTOMS THAT SHOULD BE REPORTED IMMEDIATELY:  *FEVER GREATER THAN 100.5 F  *CHILLS WITH OR WITHOUT FEVER  NAUSEA AND VOMITING THAT IS NOT CONTROLLED WITH YOUR NAUSEA MEDICATION  *UNUSUAL SHORTNESS OF BREATH  *UNUSUAL BRUISING OR BLEEDING  TENDERNESS IN MOUTH AND THROAT WITH OR WITHOUT PRESENCE OF ULCERS  *URINARY PROBLEMS  *BOWEL PROBLEMS  UNUSUAL RASH Items with * indicate a potential emergency and should be followed up as soon as possible.  Feel free to call the clinic should you have any questions or concerns. The clinic phone number is [phone].  Please show the CHEMO ALERT CARD at check-in to the Emergency Department and triage nurse.   

## 2019-10-24 ENCOUNTER — Telehealth: Payer: Self-pay | Admitting: Hematology

## 2019-10-24 NOTE — Telephone Encounter (Signed)
Per 1/6 los appts already scheduled.

## 2019-10-28 ENCOUNTER — Ambulatory Visit
Admission: RE | Admit: 2019-10-28 | Discharge: 2019-10-28 | Disposition: A | Discharge status: Home / Self Care | Payer: Medicare Other | Source: Ambulatory Visit | Attending: Otolaryngology | Admitting: Otolaryngology

## 2019-10-28 DIAGNOSIS — R1313 Dysphagia, pharyngeal phase: Secondary | ICD-10-CM

## 2019-10-29 ENCOUNTER — Telehealth: Payer: Self-pay | Admitting: *Deleted

## 2019-10-29 MED ORDER — ERGOCALCIFEROL 1.25 MG (50000 UT) PO CAPS: 50000.0000 [IU] | ORAL_CAPSULE | ORAL | 3 refills | Status: DC

## 2019-10-29 NOTE — Telephone Encounter (Signed)
Contacted patient regarding test results and medication ordered per Dr. Grier Mitts directions as noted. Patient verbalized understanding.

## 2019-10-29 NOTE — Telephone Encounter (Signed)
-----   Message from [name], MD sent at 10/29/2019 11:48 AM EST ----- Could you let Michael [name] know that his vitamin D levels are low normal and we have added ergocalciferol 50,000 units weekly.  Prescription has been sent to his pharmacy.  Thanks

## 2019-11-06 ENCOUNTER — Inpatient Hospital Stay: Payer: Medicare Other

## 2019-11-06 ENCOUNTER — Other Ambulatory Visit: Payer: Self-pay

## 2019-11-06 VITALS — BP 145/78 | HR 64 | Temp 97.3°F | Resp 18

## 2019-11-06 DIAGNOSIS — D539 Nutritional anemia, unspecified: Secondary | ICD-10-CM | POA: Diagnosis not present

## 2019-11-06 DIAGNOSIS — E559 Vitamin D deficiency, unspecified: Secondary | ICD-10-CM

## 2019-11-06 DIAGNOSIS — C9000 Multiple myeloma not having achieved remission: Secondary | ICD-10-CM

## 2019-11-06 DIAGNOSIS — C9002 Multiple myeloma in relapse: Secondary | ICD-10-CM

## 2019-11-06 DIAGNOSIS — Z5112 Encounter for antineoplastic immunotherapy: Secondary | ICD-10-CM

## 2019-11-06 DIAGNOSIS — Z7189 Other specified counseling: Secondary | ICD-10-CM

## 2019-11-06 DIAGNOSIS — Z79899 Other long term (current) drug therapy: Secondary | ICD-10-CM | POA: Diagnosis not present

## 2019-11-06 DIAGNOSIS — M4854XA Collapsed vertebra, not elsewhere classified, thoracic region, initial encounter for fracture: Secondary | ICD-10-CM | POA: Diagnosis not present

## 2019-11-06 DIAGNOSIS — D72829 Elevated white blood cell count, unspecified: Secondary | ICD-10-CM | POA: Diagnosis not present

## 2019-11-06 LAB — CBC WITH DIFFERENTIAL/PLATELET
Abs Immature Granulocytes: 0.01 10*3/uL (ref 0.00–0.07)
Basophils Absolute: 0.1 10*3/uL (ref 0.0–0.1)
Basophils Relative: 1 %
Eosinophils Absolute: 0.2 10*3/uL (ref 0.0–0.5)
Eosinophils Relative: 4 %
HCT: 39.6 % (ref 39.0–52.0)
Hemoglobin: 13.4 g/dL (ref 13.0–17.0)
Immature Granulocytes: 0 %
Lymphocytes Relative: 14 %
Lymphs Abs: 0.6 10*3/uL — ABNORMAL LOW (ref 0.7–4.0)
MCH: 32.4 pg (ref 26.0–34.0)
MCHC: 33.8 g/dL (ref 30.0–36.0)
MCV: 95.9 fL (ref 80.0–100.0)
Monocytes Absolute: 0.4 10*3/uL (ref 0.1–1.0)
Monocytes Relative: 10 %
Neutro Abs: 3.1 10*3/uL (ref 1.7–7.7)
Neutrophils Relative %: 71 %
Platelets: 200 10*3/uL (ref 150–400)
RBC: 4.13 MIL/uL — ABNORMAL LOW (ref 4.22–5.81)
RDW: 12.8 % (ref 11.5–15.5)
WBC: 4.4 10*3/uL (ref 4.0–10.5)
nRBC: 0 % (ref 0.0–0.2)

## 2019-11-06 LAB — CMP (CANCER CENTER ONLY)
ALT: 19 U/L (ref 0–44)
AST: 14 U/L — ABNORMAL LOW (ref 15–41)
Albumin: 4.1 g/dL (ref 3.5–5.0)
Alkaline Phosphatase: 69 U/L (ref 38–126)
Anion gap: 9 (ref 5–15)
BUN: 10 mg/dL (ref 8–23)
CO2: 24 mmol/L (ref 22–32)
Calcium: 8.9 mg/dL (ref 8.9–10.3)
Chloride: 108 mmol/L (ref 98–111)
Creatinine: 0.83 mg/dL (ref 0.61–1.24)
GFR, Est AFR Am: >60 mL/min (ref >60)
GFR, Estimated: >60 mL/min (ref >60)
Glucose, Bld: 127 mg/dL — ABNORMAL HIGH (ref 70–99)
Potassium: 4 mmol/L (ref 3.5–5.1)
Sodium: 141 mmol/L (ref 135–145)
Total Bilirubin: 0.9 mg/dL (ref 0.3–1.2)
Total Protein: 6.4 g/dL — ABNORMAL LOW (ref 6.5–8.1)

## 2019-11-06 LAB — VITAMIN D 25 HYDROXY (VIT D DEFICIENCY, FRACTURES): Vit D, 25-Hydroxy: 35.79 ng/mL (ref 30–100)

## 2019-11-06 MED ORDER — PROCHLORPERAZINE MALEATE 10 MG PO TABS
ORAL_TABLET | ORAL | Status: AC
  Filled 2019-11-06: qty 1

## 2019-11-06 MED ORDER — ZOLEDRONIC ACID 4 MG/100ML IV SOLN
4.0000 mg | Freq: Once | INTRAVENOUS | Status: AC
  Administered 2019-11-06: 10:00:00 4 mg via INTRAVENOUS

## 2019-11-06 MED ORDER — ZOLEDRONIC ACID 4 MG/100ML IV SOLN
INTRAVENOUS | Status: AC
  Filled 2019-11-06: qty 100

## 2019-11-06 MED ORDER — DEXTROSE 5 % IV SOLN
150.0000 mg | Freq: Once | INTRAVENOUS | Status: AC
  Administered 2019-11-06: 11:00:00 150 mg via INTRAVENOUS
  Filled 2019-11-06: qty 60

## 2019-11-06 MED ORDER — ACETAMINOPHEN 325 MG PO TABS
650.0000 mg | ORAL_TABLET | Freq: Once | ORAL | Status: AC
  Administered 2019-11-06: 650 mg via ORAL

## 2019-11-06 MED ORDER — ACETAMINOPHEN 325 MG PO TABS
ORAL_TABLET | ORAL | Status: AC
  Filled 2019-11-06: qty 2

## 2019-11-06 MED ORDER — DIPHENHYDRAMINE HCL 25 MG PO CAPS
50.0000 mg | ORAL_CAPSULE | Freq: Once | ORAL | Status: AC
  Administered 2019-11-06: 10:00:00 50 mg via ORAL

## 2019-11-06 MED ORDER — DIPHENHYDRAMINE HCL 25 MG PO CAPS
ORAL_CAPSULE | ORAL | Status: AC
  Filled 2019-11-06: qty 2

## 2019-11-06 MED ORDER — METHYLPREDNISOLONE SODIUM SUCC 125 MG IJ SOLR
60.0000 mg | Freq: Once | INTRAMUSCULAR | Status: AC
  Administered 2019-11-06: 10:00:00 60 mg via INTRAVENOUS

## 2019-11-06 MED ORDER — SODIUM CHLORIDE 0.9 % IV SOLN
Freq: Once | INTRAVENOUS | Status: AC
  Filled 2019-11-06: qty 250

## 2019-11-06 MED ORDER — PROCHLORPERAZINE MALEATE 10 MG PO TABS
10.0000 mg | ORAL_TABLET | Freq: Once | ORAL | Status: AC
  Administered 2019-11-06: 10:00:00 10 mg via ORAL

## 2019-11-06 MED ORDER — METHYLPREDNISOLONE SODIUM SUCC 125 MG IJ SOLR
INTRAMUSCULAR | Status: AC
  Filled 2019-11-06: qty 2

## 2019-11-06 NOTE — Patient Instructions (Signed)
[facility] Discharge Instructions for Patients Receiving Chemotherapy  Today you received the following chemotherapy agents:  Kyprolis  To help prevent nausea and vomiting after your treatment, we encourage you to take your nausea medication as directed.   If you develop nausea and vomiting that is not controlled by your nausea medication, call the clinic.   BELOW ARE SYMPTOMS THAT SHOULD BE REPORTED IMMEDIATELY:  *FEVER GREATER THAN 100.5 F  *CHILLS WITH OR WITHOUT FEVER  NAUSEA AND VOMITING THAT IS NOT CONTROLLED WITH YOUR NAUSEA MEDICATION  *UNUSUAL SHORTNESS OF BREATH  *UNUSUAL BRUISING OR BLEEDING  TENDERNESS IN MOUTH AND THROAT WITH OR WITHOUT PRESENCE OF ULCERS  *URINARY PROBLEMS  *BOWEL PROBLEMS  UNUSUAL RASH Items with * indicate a potential emergency and should be followed up as soon as possible.  Feel free to call the clinic should you have any questions or concerns. The clinic phone number is [phone].  Please show the CHEMO ALERT CARD at check-in to the Emergency Department and triage nurse.   

## 2019-11-08 LAB — MULTIPLE MYELOMA PANEL, SERUM
Albumin SerPl Elph-Mcnc: 3.9 g/dL (ref 2.9–4.4)
Albumin/Glob SerPl: 1.9 — ABNORMAL HIGH (ref 0.7–1.7)
Alpha 1: 0.2 g/dL (ref 0.0–0.4)
Alpha2 Glob SerPl Elph-Mcnc: 0.6 g/dL (ref 0.4–1.0)
B-Globulin SerPl Elph-Mcnc: 0.9 g/dL (ref 0.7–1.3)
Gamma Glob SerPl Elph-Mcnc: 0.3 g/dL — ABNORMAL LOW (ref 0.4–1.8)
Globulin, Total: 2.1 g/dL — ABNORMAL LOW (ref 2.2–3.9)
IgA: 7 mg/dL — ABNORMAL LOW (ref 61–437)
IgG (Immunoglobin G), Serum: 386 mg/dL — ABNORMAL LOW (ref 603–1613)
IgM (Immunoglobulin M), Srm: 6 mg/dL — ABNORMAL LOW (ref 20–172)
M Protein SerPl Elph-Mcnc: 0.1 g/dL — ABNORMAL HIGH
Total Protein ELP: 6 g/dL (ref 6.0–8.5)

## 2019-11-20 ENCOUNTER — Inpatient Hospital Stay: Payer: Medicare Other | Attending: Hematology

## 2019-11-20 ENCOUNTER — Inpatient Hospital Stay: Payer: Medicare Other

## 2019-11-20 ENCOUNTER — Other Ambulatory Visit: Payer: Self-pay

## 2019-11-20 ENCOUNTER — Inpatient Hospital Stay (HOSPITAL_BASED_OUTPATIENT_CLINIC_OR_DEPARTMENT_OTHER): Payer: Medicare Other | Admitting: Hematology

## 2019-11-20 VITALS — BP 158/83 | HR 70 | Temp 98.4°F | Resp 20 | Ht 73.0 in | Wt 246.5 lb

## 2019-11-20 DIAGNOSIS — Z79899 Other long term (current) drug therapy: Secondary | ICD-10-CM | POA: Diagnosis not present

## 2019-11-20 DIAGNOSIS — M4854XA Collapsed vertebra, not elsewhere classified, thoracic region, initial encounter for fracture: Secondary | ICD-10-CM | POA: Insufficient documentation

## 2019-11-20 DIAGNOSIS — C9000 Multiple myeloma not having achieved remission: Secondary | ICD-10-CM

## 2019-11-20 DIAGNOSIS — E559 Vitamin D deficiency, unspecified: Secondary | ICD-10-CM

## 2019-11-20 DIAGNOSIS — D539 Nutritional anemia, unspecified: Secondary | ICD-10-CM | POA: Insufficient documentation

## 2019-11-20 DIAGNOSIS — Z7189 Other specified counseling: Secondary | ICD-10-CM

## 2019-11-20 DIAGNOSIS — D72829 Elevated white blood cell count, unspecified: Secondary | ICD-10-CM | POA: Diagnosis not present

## 2019-11-20 DIAGNOSIS — Z5112 Encounter for antineoplastic immunotherapy: Secondary | ICD-10-CM | POA: Diagnosis not present

## 2019-11-20 LAB — CMP (CANCER CENTER ONLY)
ALT: 17 U/L (ref 0–44)
AST: 12 U/L — ABNORMAL LOW (ref 15–41)
Albumin: 4 g/dL (ref 3.5–5.0)
Alkaline Phosphatase: 71 U/L (ref 38–126)
Anion gap: 9 (ref 5–15)
BUN: 11 mg/dL (ref 8–23)
CO2: 25 mmol/L (ref 22–32)
Calcium: 9.1 mg/dL (ref 8.9–10.3)
Chloride: 108 mmol/L (ref 98–111)
Creatinine: 0.85 mg/dL (ref 0.61–1.24)
GFR, Est AFR Am: >60 mL/min (ref >60)
GFR, Estimated: >60 mL/min (ref >60)
Glucose, Bld: 121 mg/dL — ABNORMAL HIGH (ref 70–99)
Potassium: 3.8 mmol/L (ref 3.5–5.1)
Sodium: 142 mmol/L (ref 135–145)
Total Bilirubin: 0.7 mg/dL (ref 0.3–1.2)
Total Protein: 6.2 g/dL — ABNORMAL LOW (ref 6.5–8.1)

## 2019-11-20 LAB — CBC WITH DIFFERENTIAL/PLATELET
Abs Immature Granulocytes: 0.05 10*3/uL (ref 0.00–0.07)
Basophils Absolute: 0.1 10*3/uL (ref 0.0–0.1)
Basophils Relative: 1 %
Eosinophils Absolute: 0.2 10*3/uL (ref 0.0–0.5)
Eosinophils Relative: 3 %
HCT: 39.2 % (ref 39.0–52.0)
Hemoglobin: 13.1 g/dL (ref 13.0–17.0)
Immature Granulocytes: 1 %
Lymphocytes Relative: 12 %
Lymphs Abs: 0.6 10*3/uL — ABNORMAL LOW (ref 0.7–4.0)
MCH: 32.7 pg (ref 26.0–34.0)
MCHC: 33.4 g/dL (ref 30.0–36.0)
MCV: 97.8 fL (ref 80.0–100.0)
Monocytes Absolute: 0.5 10*3/uL (ref 0.1–1.0)
Monocytes Relative: 9 %
Neutro Abs: 3.6 10*3/uL (ref 1.7–7.7)
Neutrophils Relative %: 74 %
Platelets: 263 10*3/uL (ref 150–400)
RBC: 4.01 MIL/uL — ABNORMAL LOW (ref 4.22–5.81)
RDW: 12.8 % (ref 11.5–15.5)
WBC: 4.9 10*3/uL (ref 4.0–10.5)
nRBC: 0 % (ref 0.0–0.2)

## 2019-11-20 LAB — VITAMIN D 25 HYDROXY (VIT D DEFICIENCY, FRACTURES): Vit D, 25-Hydroxy: 47.1 ng/mL (ref 30–100)

## 2019-11-20 MED ORDER — DIPHENHYDRAMINE HCL 25 MG PO CAPS
ORAL_CAPSULE | ORAL | Status: AC
  Filled 2019-11-20: qty 2

## 2019-11-20 MED ORDER — DEXTROSE 5 % IV SOLN
150.0000 mg | Freq: Once | INTRAVENOUS | Status: AC
  Administered 2019-11-20: 150 mg via INTRAVENOUS
  Filled 2019-11-20: qty 60

## 2019-11-20 MED ORDER — PROCHLORPERAZINE MALEATE 10 MG PO TABS
10.0000 mg | ORAL_TABLET | Freq: Once | ORAL | Status: AC
  Administered 2019-11-20: 10 mg via ORAL

## 2019-11-20 MED ORDER — DIPHENHYDRAMINE HCL 25 MG PO CAPS
50.0000 mg | ORAL_CAPSULE | Freq: Once | ORAL | Status: AC
  Administered 2019-11-20: 10:00:00 50 mg via ORAL

## 2019-11-20 MED ORDER — METHYLPREDNISOLONE SODIUM SUCC 125 MG IJ SOLR
INTRAMUSCULAR | Status: AC
  Filled 2019-11-20: qty 2

## 2019-11-20 MED ORDER — SODIUM CHLORIDE 0.9 % IV SOLN
Freq: Once | INTRAVENOUS | Status: AC
  Filled 2019-11-20: qty 250

## 2019-11-20 MED ORDER — ACETAMINOPHEN 325 MG PO TABS
650.0000 mg | ORAL_TABLET | Freq: Once | ORAL | Status: AC
  Administered 2019-11-20: 650 mg via ORAL

## 2019-11-20 MED ORDER — METHYLPREDNISOLONE SODIUM SUCC 125 MG IJ SOLR
60.0000 mg | Freq: Once | INTRAMUSCULAR | Status: AC
  Administered 2019-11-20: 60 mg via INTRAVENOUS

## 2019-11-20 MED ORDER — PROCHLORPERAZINE MALEATE 10 MG PO TABS
ORAL_TABLET | ORAL | Status: AC
  Filled 2019-11-20: qty 1

## 2019-11-20 MED ORDER — ACETAMINOPHEN 325 MG PO TABS
ORAL_TABLET | ORAL | Status: AC
  Filled 2019-11-20: qty 2

## 2019-11-20 NOTE — Progress Notes (Signed)
HEMATOLOGY/ONCOLOGY CLINIC NOTE  Date of Service: 11/20/19     Patient Care Team: [name], MD as PCP - General (Family Medicine) [name], MD as Consulting Physician (Hematology) [name], MD as Attending Physician (Radiation Oncology) [name], RN as Oncology Nurse Navigator (Oncology) [name] MD ([facility] _ primary oncology) phone number [phone] Email- [email or web address]  CHIEF COMPLAINTS  followup of multiple myeloma.  HISTORY OF PRESENTING ILLNESS:   Michael [name] is a wonderful 69 y.o. 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 Patient is currently on a study protocol and is being monitored for myeloma recurrence.  Based on available records being put together  -Patient was diagnosed with IgG kappa ISS stage I multiple myeloma with hyperdiploid complex cytogenetics in 2012. Bone marrow biopsy apparently showed 50% kappa restricted plasma cells with an initial M spike of 3.4 g/dL with evidence of lytic lesions on his PET/CT scan including right rib fracture and lesions of the lumbar and cervical spine. -Patient was treated under protocol 11-C-0221 with from 02/21/2013 with  Carfilzomib/Revlimid/Dexamethasone for a total of 8 cycles. He reports that his stem cells were collected and stored after 4 cycles -Posttreatment bone marrow biopsy showed less than 5% plasma cells with a negative flow cytometry and improvement in his previously PET avid lesions. Some concern for new focus of activity at C2 lytic lesion at L4 and several small lesions throughout the vertebrae. -Patient was on maintenance lenalidomide 10 mg by  mouth daily for 2 years or more until end of 2014. 08/19/2013 - bone marrow biopsy showed normocellular marrow with less than 5% plasma cells. PET CT scan showed decreased focal metabolic ligament prior rib lesions and no new lesions. Flow cytometry was negative for residual disease. Serum and urine electrophoresis and IFE showed no evidence of monoclonal protein. -Patient notes that he was off protocol for a few years due to lack of clinical trial research funding. -Patient notes that he is back on a follow-up protocol and continues to follow and receive his primary treatment at [facility] at this time. -Patient reports he had his last PET CT scan blood and urine tests and bone marrow examination in February 2018. The results of which are not available to [place] currently.  He notes that there was concern for a very slowly growing sternal FDG avid lesion that has been present for years. He has a follow-up at [facility] next month to determine the management of this lesion. He reports that was some consideration of considering radiation. he notes that this lesion has not been biopsied. No other focal bone pains at this time.  We discussed in detail about what our role will be and noted that we shall be available for any acute issues that need to be taken care of locally but that at this time the primary treatment and evaluation will be driven by his team at [facility] as per their research protocols and input. The patient and his wife are clearly aware of this and are in agreement with this plan.  INTERVAL HISTORY  [name] is here for continued management of his Multiple Myeloma. He is here for C14D15 of maintenance Carfilzomib. The patient's last visit  with [place] was on 10/23/2019. The pt reports that he is doing well overall.  The pt reports that he and his wife now have home care assistance, which has been a help. His BP in the mornings run in the 130s. He is still having some back pain but it is  manageable. Pt has not needed much pain medication to manage his back pain. Pt is still waiting to get an appointment for his COVID19 vaccine.   Lab results today (11/20/19) of CBC w/diff and CMP is as follows: all values are WNL except for RBC at 4.01, Lymphs Abs at 0.6K, Glucose at 121, Total Protein at 6.2, AST at 12. 11/20/2019 Vitamin D 25-hydroxy at 47.10 11/20/2019 MMP is in progress  On review of systems, pt reports chronic back pain and denies fevers, chills, pain along the spine, abdominal pain, constipation and any other symptoms.    MEDICAL HISTORY:  Past Medical History:  Diagnosis Date  . Angioedema   . CPAP (continuous positive airway pressure) dependence    uses at night  . Gastroesophageal reflux disease without esophagitis   . History of radiation therapy 06/04/18- 06/15/18   25 Gy in 10 fractions to the laryngeal/ cartilage lesion  . Hyperlipidemia   . Multiple myeloma ([place]) 2012   Treated at [facility]  . Non morbid obesity due to excess calories   . Prediabetes   . Prediabetes   . Unspecified glaucoma(365.9)   . Urticaria, unspecified   Diabetes mellitus Glaucoma Left toes neuropathy  GERD  SURGICAL HISTORY: Past Surgical History:  Procedure Laterality Date  . IR RADIOLOGIST EVAL & MGMT  11/13/2018  . PORTACATH PLACEMENT    . PORTACATH PLACEMENT     removed 02/2016    SOCIAL HISTORY: Social History   Socioeconomic History  . Marital status: Married    Spouse name: Not on file  . Number of children: 2  . Years of education: Not on file  . Highest education level: Not on file  Occupational History  . Occupation: Retired Chief Strategy Officer for [employer]  . Smoking status: Never Smoker  . Smokeless tobacco: Never Used  Substance and Sexual Activity  . Alcohol use: Yes    Alcohol/week: 0.0 standard drinks    Comment: Occasional beer  . Drug use: No  . Sexual activity: Not on file  Other Topics Concern  . Not on file  Social History Narrative     Unable to [employer] partner violence- partner in room    Social Determinants of Health   Financial Resource Strain:   . Difficulty of Paying Living Expenses: Not on file  Food Insecurity:   . Worried About Charity fundraiser in the Last Year: Not on file  . Ran Out of Food in the Last Year: Not on file  Transportation Needs:   . Lack of Transportation (Medical): Not on file  . Lack of Transportation (Non-Medical): Not on file  Physical Activity:   . Days of Exercise per Week: Not on file  . Minutes of Exercise per Session: Not on file  Stress:   . Feeling of Stress : Not on file  Social Connections:   . Frequency of Communication with Friends and Family: Not on file  . Frequency of Social Gatherings with Friends and Family: Not on file  . Attends Religious Services: Not on file  . Active Member of Clubs or Organizations: Not on file  . Attends Archivist Meetings: Not on file  .  Marital Status: Not on file  Intimate Partner Violence:   . Fear of Current or Ex-Partner: Not on file  . Emotionally Abused: Not on file  . Physically Abused: Not on file  . Sexually Abused: Not on file  Never smoker Married Retired from the [employer] in June 2017 and moved to [facility].  FAMILY HISTORY: Family History  Problem Relation Age of Onset  . Cancer Father     ALLERGIES:  has No Known Allergies.  MEDICATIONS:  Current Outpatient Medications  Medication Sig Dispense Refill  . acyclovir (ZOVIRAX) 400 MG tablet Take 1 tablet (400 mg total) by mouth 2 (two) times daily. 60 tablet 11  . aspirin 81 MG tablet Take 81 mg by mouth daily.    [name] atorvastatin (LIPITOR) 20 MG tablet Take 20 mg by mouth every evening.     . Calcium Citrate-Vitamin D (CALCIUM CITRATE + D3 MAXIMUM PO) Take 630 mg by mouth.     . dexamethasone (DECADRON) 4 MG tablet 69m (5 tabs) with breakfast the day after each daratumumab treatment 20 tablet 4  . ergocalciferol (VITAMIN D2)  1.25 MG (50000 UT) capsule Take 1 capsule (50,000 Units total) by mouth once a week. 12 capsule 3  . Multiple Vitamin (MULTIVITAMIN) tablet Take 1 tablet by mouth daily.    . ondansetron (ZOFRAN) 8 MG tablet Take 1 tablet (8 mg total) by mouth 2 (two) times daily as needed (Nausea or vomiting). 30 tablet 1  . oxyCODONE (OXY IR/ROXICODONE) 5 MG immediate release tablet Take 1-2 tablets (5-10 mg total) by mouth every 4 (four) hours as needed for severe pain or breakthrough pain. (Patient taking differently: Take 5-10 mg by mouth every 4 (four) hours as needed for severe pain or breakthrough pain. Patient reports taking only as needed - rarely) 60 tablet 0  . pantoprazole (PROTONIX) 40 MG tablet Take 40 mg by mouth daily.     . prochlorperazine (COMPAZINE) 10 MG tablet Take 1 tablet (10 mg total) by mouth every 6 (six) hours as needed (Nausea or vomiting). 30 tablet 1  . senna-docusate (SENOKOT-S) 8.6-50 MG tablet Take 2 tablets by mouth at bedtime. Takes 50 mg 60 tablet 3   No current facility-administered medications for this visit.    REVIEW OF SYSTEMS:   A 10+ POINT REVIEW OF SYSTEMS WAS OBTAINED including neurology, dermatology, psychiatry, cardiac, respiratory, lymph, extremities, GI, GU, Musculoskeletal, constitutional, breasts, reproductive, HEENT.  All pertinent positives are noted in the HPI.  All others are negative.   PHYSICAL EXAMINATION: ECOG PERFORMANCE STATUS: 1 - Symptomatic but completely ambulatory  Vitals:   11/20/19 0924  BP: (!) 158/83  Pulse: 70  Resp: 20  Temp: 98.4 F (36.9 C)  TempSrc: Temporal  SpO2: 98%  Weight: 246 lb 8 oz (111.8 kg)  Height: 6' 1"  (1.854 m)   Body mass index is 32.52 kg/m.   Exam was given in a chair   GENERAL:alert, in no acute distress and comfortable SKIN: no acute rashes, no significant lesions EYES: conjunctiva are pink and non-injected, sclera anicteric OROPHARYNX: MMM, no exudates, no oropharyngeal erythema or ulceration NECK:  supple, no JVD LYMPH:  no palpable lymphadenopathy in the cervical, axillary or inguinal regions LUNGS: clear to auscultation b/l with normal respiratory effort HEART: regular rate & rhythm ABDOMEN:  normoactive bowel sounds , non tender, not distended. No palpable hepatosplenomegaly.  Extremity: no pedal edema PSYCH: alert & oriented x 3 with fluent speech NEURO: no focal motor/sensory deficits  LABORATORY DATA:  I have reviewed the data as listed . CBC Latest Ref Rng & Units 11/06/2019 10/23/2019 10/09/2019  WBC 4.0 - 10.5 K/uL 4.4 4.3 3.9(L)  Hemoglobin 13.0 - 17.0 g/dL 13.4 12.8(L) 13.0  Hematocrit 39.0 - 52.0 % 39.6 37.7(L) 38.9(L)  Platelets 150 - 400 K/uL 200 197 210    . CMP Latest Ref Rng & Units 11/06/2019 10/23/2019 10/09/2019  Glucose 70 - 99 mg/dL 127(H) 112(H) 119(H)  BUN 8 - 23 mg/dL 10 12 13   Creatinine 0.61 - 1.24 mg/dL 0.83 0.81 0.79  Sodium 135 - 145 mmol/L 141 142 141  Potassium 3.5 - 5.1 mmol/L 4.0 3.8 3.7  Chloride 98 - 111 mmol/L 108 110 105  CO2 22 - 32 mmol/L 24 25 26   Calcium 8.9 - 10.3 mg/dL 8.9 8.7(L) 8.9  Total Protein 6.5 - 8.1 g/dL 6.4(L) 5.8(L) 6.3(L)  Total Bilirubin 0.3 - 1.2 mg/dL 0.9 0.6 1.1  Alkaline Phos 38 - 126 U/L 69 60 61  AST 15 - 41 U/L 14(L) 12(L) 16  ALT 0 - 44 U/L 19 17 16    08/16/2019 BM Bx Report ([id number]):   08/16/2019 FISH Panel:      05/08/18 Left Neck Thyroid Cartilage Biopsy:        RADIOGRAPHIC STUDIES: I have personally reviewed the radiological images as listed and agreed with the findings in the report. DG ESOPHAGUS W DOUBLE CM (HD)  Result Date: 10/28/2019 CLINICAL DATA:  Pharyngeal dysphagia. Intermittent choking. Low-dose radiation therapy to neck approximately 1 year ago from multiple myeloma. EXAM: ESOPHOGRAM / BARIUM SWALLOW / BARIUM TABLET STUDY TECHNIQUE: Combined double contrast and single contrast examination performed using effervescent crystals, thick barium liquid, and thin barium liquid. The  patient was observed with fluoroscopy swallowing a 13 mm barium sulphate tablet. FLUOROSCOPY TIME:  Fluoroscopy Time:  2 minutes 54 seconds Radiation Exposure Index (if provided by the fluoroscopic device): 357 mGy Number of Acquired Spot Images: 0 COMPARISON:  None. FINDINGS: No evidence of vestibular penetration or aspiration during swallowing. No evidence of esophageal mass or stricture. A small hiatal hernia is seen intermittently, however no gastroesophageal reflux was demonstrated during the exam. Mild esophageal dysmotility is seen with break up of primary peristalsis. An ingested 89m barium tablet passed freely through the esophagus, and into the stomach. IMPRESSION: 1. Small hiatal hernia. No evidence of esophageal mass or stricture. 2. Mild esophageal dysmotility. Electronically Signed   By: JMarlaine HindM.D.   On: 10/28/2019 09:03    ASSESSMENT & PLAN:   69y.o. caucasian male, retired [employer] with   1) Multiple Myeloma Patient was diagnosed with IgG kappa ISS stage I multiple myeloma with hyperdiploid complex cytogenetics in 2012. Bone marrow biopsy apparently showed 50% kappa restricted plasma cells with an initial M spike of 3.4 g/dL with evidence of lytic lesions on his PET/CT scan including right rib fracture and lesions of the lumbar and cervical spine. -Patient was treated under protocol 11-C-0221 with from 02/21/2013 with  Carfilzomib/Revlimid/Dexamethasone for a total of 8 cycles. He reports that his stem cells were collected and stored after 4 cycles -Posttreatment bone marrow biopsy showed less than 5% plasma cells with a negative flow cytometry and improvement in his previously PET avid lesions. Some concern for new focus of activity at C2 lytic lesion at L4 and several small lesions throughout the vertebrae. -Patient was on maintenance lenalidomide 10 mg by mouth daily for 2 years or more until end of 2014. 08/19/2013 - bone  marrow biopsy showed normocellular marrow with less  than 5% plasma cells. PET CT scan showed decreased focal metabolic ligament prior rib lesions and no new lesions. Flow cytometry was negative for residual disease. Serum and urine electrophoresis and IFE showed no evidence of monoclonal protein. -Patient notes that he was off protocol for a few years due to lack of clinical trial research funding. -Patient returned to follow-up protocol and began to follow and receive his primary treatment at [facility]  -Patient's labs show minimal IgG kappa protein on IFE and a CT chest in May 2018 that did not show any overt signs of myeloma progression in the bones.  Rpt Myeloma labs 03/2017 - M spike of 0.3g/dl with a repeat M spike stable at 0.3 g/dL on 05/23/2017 which remains stable on labs today 07/25/17 Serum kappa lambda free light chain ratio not significantly changed. PET/CT scan showed a single metabolically active sternal lesion without any overt bony destruction. Bone marrow biopsy done on 06/06/2017 - shows no overt involvement with multiple myeloma.  04/24/18 CT Soft Tissue Neck revealed Left laryngeal 3 cm soft tissue mass appears to have originated within and is expanding the left thyroid cartilage. This is new since the August 2018 PET-CT, and there is absent lymphadenopathy. 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 Primary cartilaginous neoplasm, squamous cell carcinoma, and other differential considerations are less likely. Superimposed widespread multiple myeloma lesions in the visible skeleton.  05/08/18 Left neck thyroid cartilage biopsy revealed Plasma cell neoplasm.  05/22/18 PET/CT revealed Soft tissue mass centered around the destroyed left thyroid cartilage is hypermetabolic and consistent with known plasmacytoma. No adenopathy in the neck. 2. Hypermetabolic 3 cm cutaneous and subcutaneous soft tissue mass involving the posterior upper thorax suspicious for cutaneous manifestation of myeloma (plasmacytoma). 3.  Persistent and slightly progressive hypermetabolic sternal lesion. 4. Diffuse lytic myelomatous lesions throughout the bony structures but no other hypermetabolic foci. 5. Focus of hypermetabolism in the left aspect of the lower prostate gland, not present on the prior study. This could be an area of infection/inflammation or potential prostate cancer. Recommend correlation with physical examination and PSA level.    Completed RT between 06/04/18 and 06/15/18 of 25 Gy by 10 fractions  The pt pursued a clinical trial at the [facility] with RT and immunotherapy in October 2019 through October 18, 2018 with further information available at [email or web address]  10/18/18 M Protein increased to 3.5g, from 0.7g at our last visit on 07/10/18. 10/02/18 PET/CT from [facility] indicated new hypermetabolism in left clavicle, left scapula, bilateral ribs, spine, and left proximal femur 10/18/18 MRI from [facility] ndicated a pathologic fracture at T12  10/2018 M Protein at 4.1g  11/23/18 NM Bone revealed Patchy uptake throughout the ribs and spine with areas of focally increased activity in both T12 pedicles and the right L1 pedicle. This focal activity could be stress related without clear corresponding fracture on available prior studies.  08/16/2019 FISH Panel which revealed "No mutations detected."  08/16/2019 BM Bx Surgical Pathology [id number]) which revealed  "DIAGNOSIS: BONE MARROW, ASPIRATE, CLOT, CORE: -Hypercellular bone marrow for age with trilineage hematopoiesis and less than 1% plasma cells PERIPHERAL BLOOD: -Macrocytic anemia -Leukocytosis ".  09/03/2019 PET/CT scan ([id number]) revealed  "1. No evidence of active multiple myeloma on whole-body FDG PET scan. 2. Stable lytic lesions within the spine and sternum consistent treated metastasis. 3. No soft tissue plasmacytoma. 4. New compression fracture at T12 compared to PET-CT scan 10/02/2018."  2) h/o Elevated bilirubin  level . ? [place]  3)  PET/CT abnormality in the Prostate. PSA levels WNL -Primary care physician to consider urology referral  4) Abnormal focus of FDG avid at a spot in the liver - will need to be monitor on f/u scans. No pain or other symptoms at this time.  PLAN:  -Discussed pt labwork today, 11/20/19; blood counts and chemistries look good -Discussed 11/20/2019 Vitamin D 25-hydroxy is WNL at 47.10 -Discussed 11/20/2019 MMP - M protein 0.56m/dl -Last M Protein is stable at 0.2 g/dL - could be from Daratumumab  -Continue Zometa every 3 months  -Continue Acyclovir -Continue Vitamin D/Calcium supplement -Advised pt to watch sodium intake and stay active - BP should come down since pt has been taken off of Dexamethasone -Pt is still interested in a transplant but would like to wait to get both doses of the COVID19 vaccine before beginning the process  -Will continue Carfilzomib every 2 weeks for maintenance, until progression or intolerance -The pt has no prohibitive toxicities from continuing C14D15 of Carfilzomib at this time. -Will begin 70 mg/m^2 Carflizomib with C14D15  -Recommend pt f/u with Dr. MSabra Heckfor HTN management -Will see back in 4 weeks with labs   FOLLOW UP: Please schedule next 2 cycles (4 doses) of maintenance carfilzomib with labs and port flush. Return to clinic with Dr. KIrene Limboin 4 weeks   The total time spent in the appt was 30 minutes and more than 50% was on counseling and direct patient cares.  All of the patient's questions were answered with apparent satisfaction. The patient knows to call the clinic with any problems, questions or concerns.    GSullivan LoneMD MSt. FrancisvilleAAHIVMS SSt. Vincent Anderson Regional HospitalCVa Medical Center - John Cochran DivisionHematology/Oncology Physician CCedar [facility] (Office):       [phone](Work cell):  [phone](Fax):           [phone]  I, JYevette Edwards am acting as a scribe for Dr. GSullivan Lone   .I have reviewed the above documentation for accuracy and completeness, and I agree with  the above. .Brunetta GeneraMD

## 2019-11-20 NOTE — Patient Instructions (Signed)
[facility] Discharge Instructions for Patients Receiving Chemotherapy  Today you received the following chemotherapy agent: Kyprolis.  To help prevent nausea and vomiting after your treatment, we encourage you to take your nausea medication as directed.   If you develop nausea and vomiting that is not controlled by your nausea medication, call the clinic.   BELOW ARE SYMPTOMS THAT SHOULD BE REPORTED IMMEDIATELY:  *FEVER GREATER THAN 100.5 F  *CHILLS WITH OR WITHOUT FEVER  NAUSEA AND VOMITING THAT IS NOT CONTROLLED WITH YOUR NAUSEA MEDICATION  *UNUSUAL SHORTNESS OF BREATH  *UNUSUAL BRUISING OR BLEEDING  TENDERNESS IN MOUTH AND THROAT WITH OR WITHOUT PRESENCE OF ULCERS  *URINARY PROBLEMS  *BOWEL PROBLEMS  UNUSUAL RASH Items with * indicate a potential emergency and should be followed up as soon as possible.  Feel free to call the clinic should you have any questions or concerns. The clinic phone number is [phone].  Please show the CHEMO ALERT CARD at check-in to the Emergency Department and triage nurse.   

## 2019-11-22 LAB — MULTIPLE MYELOMA PANEL, SERUM
Albumin SerPl Elph-Mcnc: 3.7 g/dL (ref 2.9–4.4)
Albumin/Glob SerPl: 1.7 (ref 0.7–1.7)
Alpha 1: 0.2 g/dL (ref 0.0–0.4)
Alpha2 Glob SerPl Elph-Mcnc: 0.7 g/dL (ref 0.4–1.0)
B-Globulin SerPl Elph-Mcnc: 1 g/dL (ref 0.7–1.3)
Gamma Glob SerPl Elph-Mcnc: 0.3 g/dL — ABNORMAL LOW (ref 0.4–1.8)
Globulin, Total: 2.3 g/dL (ref 2.2–3.9)
IgA: 8 mg/dL — ABNORMAL LOW (ref 61–437)
IgG (Immunoglobin G), Serum: 454 mg/dL — ABNORMAL LOW (ref 603–1613)
IgM (Immunoglobulin M), Srm: 9 mg/dL — ABNORMAL LOW (ref 20–172)
M Protein SerPl Elph-Mcnc: 0.1 g/dL — ABNORMAL HIGH
Total Protein ELP: 6 g/dL (ref 6.0–8.5)

## 2019-11-25 ENCOUNTER — Encounter: Payer: Self-pay | Admitting: Hematology

## 2019-11-27 ENCOUNTER — Other Ambulatory Visit: Payer: Self-pay | Admitting: Hematology

## 2019-11-27 ENCOUNTER — Encounter: Payer: Self-pay | Admitting: Hematology

## 2019-11-27 DIAGNOSIS — Z7189 Other specified counseling: Secondary | ICD-10-CM

## 2019-11-27 DIAGNOSIS — C9000 Multiple myeloma not having achieved remission: Secondary | ICD-10-CM

## 2019-12-04 ENCOUNTER — Other Ambulatory Visit: Payer: Medicare Other

## 2019-12-04 ENCOUNTER — Other Ambulatory Visit: Payer: Self-pay

## 2019-12-04 ENCOUNTER — Inpatient Hospital Stay: Payer: Medicare Other

## 2019-12-04 VITALS — BP 155/83 | HR 65 | Temp 98.1°F | Resp 18 | Ht 73.0 in | Wt 251.2 lb

## 2019-12-04 DIAGNOSIS — Z5112 Encounter for antineoplastic immunotherapy: Secondary | ICD-10-CM | POA: Diagnosis not present

## 2019-12-04 DIAGNOSIS — M4854XA Collapsed vertebra, not elsewhere classified, thoracic region, initial encounter for fracture: Secondary | ICD-10-CM | POA: Diagnosis not present

## 2019-12-04 DIAGNOSIS — Z79899 Other long term (current) drug therapy: Secondary | ICD-10-CM | POA: Diagnosis not present

## 2019-12-04 DIAGNOSIS — C9000 Multiple myeloma not having achieved remission: Secondary | ICD-10-CM

## 2019-12-04 DIAGNOSIS — D539 Nutritional anemia, unspecified: Secondary | ICD-10-CM | POA: Diagnosis not present

## 2019-12-04 DIAGNOSIS — E559 Vitamin D deficiency, unspecified: Secondary | ICD-10-CM

## 2019-12-04 DIAGNOSIS — Z7189 Other specified counseling: Secondary | ICD-10-CM

## 2019-12-04 DIAGNOSIS — D72829 Elevated white blood cell count, unspecified: Secondary | ICD-10-CM | POA: Diagnosis not present

## 2019-12-04 LAB — CBC WITH DIFFERENTIAL/PLATELET
Abs Immature Granulocytes: 0.03 10*3/uL (ref 0.00–0.07)
Basophils Absolute: 0.1 10*3/uL (ref 0.0–0.1)
Basophils Relative: 1 %
Eosinophils Absolute: 0.1 10*3/uL (ref 0.0–0.5)
Eosinophils Relative: 3 %
HCT: 38.8 % — ABNORMAL LOW (ref 39.0–52.0)
Hemoglobin: 13.2 g/dL (ref 13.0–17.0)
Immature Granulocytes: 1 %
Lymphocytes Relative: 15 %
Lymphs Abs: 0.7 10*3/uL (ref 0.7–4.0)
MCH: 33.2 pg (ref 26.0–34.0)
MCHC: 34 g/dL (ref 30.0–36.0)
MCV: 97.5 fL (ref 80.0–100.0)
Monocytes Absolute: 0.5 10*3/uL (ref 0.1–1.0)
Monocytes Relative: 11 %
Neutro Abs: 3.1 10*3/uL (ref 1.7–7.7)
Neutrophils Relative %: 69 %
Platelets: 197 10*3/uL (ref 150–400)
RBC: 3.98 MIL/uL — ABNORMAL LOW (ref 4.22–5.81)
RDW: 13.2 % (ref 11.5–15.5)
WBC: 4.5 10*3/uL (ref 4.0–10.5)
nRBC: 0 % (ref 0.0–0.2)

## 2019-12-04 LAB — CMP (CANCER CENTER ONLY)
ALT: 19 U/L (ref 0–44)
AST: 14 U/L — ABNORMAL LOW (ref 15–41)
Albumin: 4.1 g/dL (ref 3.5–5.0)
Alkaline Phosphatase: 66 U/L (ref 38–126)
Anion gap: 11 (ref 5–15)
BUN: 13 mg/dL (ref 8–23)
CO2: 25 mmol/L (ref 22–32)
Calcium: 9.2 mg/dL (ref 8.9–10.3)
Chloride: 107 mmol/L (ref 98–111)
Creatinine: 0.81 mg/dL (ref 0.61–1.24)
GFR, Est AFR Am: >60 mL/min (ref >60)
GFR, Estimated: >60 mL/min (ref >60)
Glucose, Bld: 111 mg/dL — ABNORMAL HIGH (ref 70–99)
Potassium: 3.8 mmol/L (ref 3.5–5.1)
Sodium: 143 mmol/L (ref 135–145)
Total Bilirubin: 1 mg/dL (ref 0.3–1.2)
Total Protein: 6.3 g/dL — ABNORMAL LOW (ref 6.5–8.1)

## 2019-12-04 LAB — VITAMIN D 25 HYDROXY (VIT D DEFICIENCY, FRACTURES): Vit D, 25-Hydroxy: 44.23 ng/mL (ref 30–100)

## 2019-12-04 MED ORDER — SODIUM CHLORIDE 0.9 % IV SOLN
Freq: Once | INTRAVENOUS | Status: AC
  Filled 2019-12-04: qty 250

## 2019-12-04 MED ORDER — PROCHLORPERAZINE MALEATE 10 MG PO TABS
10.0000 mg | ORAL_TABLET | Freq: Once | ORAL | Status: AC
  Administered 2019-12-04: 10 mg via ORAL

## 2019-12-04 MED ORDER — ACETAMINOPHEN 325 MG PO TABS
650.0000 mg | ORAL_TABLET | Freq: Once | ORAL | Status: AC
  Administered 2019-12-04: 650 mg via ORAL

## 2019-12-04 MED ORDER — PROCHLORPERAZINE MALEATE 10 MG PO TABS
ORAL_TABLET | ORAL | Status: AC
  Filled 2019-12-04: qty 1

## 2019-12-04 MED ORDER — METHYLPREDNISOLONE SODIUM SUCC 125 MG IJ SOLR
INTRAMUSCULAR | Status: AC
  Filled 2019-12-04: qty 2

## 2019-12-04 MED ORDER — ACETAMINOPHEN 325 MG PO TABS
ORAL_TABLET | ORAL | Status: AC
  Filled 2019-12-04: qty 2

## 2019-12-04 MED ORDER — DIPHENHYDRAMINE HCL 25 MG PO CAPS
ORAL_CAPSULE | ORAL | Status: AC
  Filled 2019-12-04: qty 2

## 2019-12-04 MED ORDER — METHYLPREDNISOLONE SODIUM SUCC 125 MG IJ SOLR
60.0000 mg | Freq: Once | INTRAMUSCULAR | Status: AC
  Administered 2019-12-04: 60 mg via INTRAVENOUS

## 2019-12-04 MED ORDER — DIPHENHYDRAMINE HCL 25 MG PO CAPS
50.0000 mg | ORAL_CAPSULE | Freq: Once | ORAL | Status: AC
  Administered 2019-12-04: 50 mg via ORAL

## 2019-12-04 MED ORDER — DEXTROSE 5 % IV SOLN
150.0000 mg | Freq: Once | INTRAVENOUS | Status: AC
  Administered 2019-12-04: 150 mg via INTRAVENOUS
  Filled 2019-12-04: qty 15

## 2019-12-04 NOTE — Patient Instructions (Signed)
[facility] Discharge Instructions for Patients Receiving Chemotherapy  Today you received the following chemotherapy agents:  Kyprolis  To help prevent nausea and vomiting after your treatment, we encourage you to take your nausea medication as directed.   If you develop nausea and vomiting that is not controlled by your nausea medication, call the clinic.   BELOW ARE SYMPTOMS THAT SHOULD BE REPORTED IMMEDIATELY:  *FEVER GREATER THAN 100.5 F  *CHILLS WITH OR WITHOUT FEVER  NAUSEA AND VOMITING THAT IS NOT CONTROLLED WITH YOUR NAUSEA MEDICATION  *UNUSUAL SHORTNESS OF BREATH  *UNUSUAL BRUISING OR BLEEDING  TENDERNESS IN MOUTH AND THROAT WITH OR WITHOUT PRESENCE OF ULCERS  *URINARY PROBLEMS  *BOWEL PROBLEMS  UNUSUAL RASH Items with * indicate a potential emergency and should be followed up as soon as possible.  Feel free to call the clinic should you have any questions or concerns. The clinic phone number is [phone].  Please show the CHEMO ALERT CARD at check-in to the Emergency Department and triage nurse.   

## 2019-12-06 ENCOUNTER — Ambulatory Visit: Payer: Medicare Other

## 2019-12-08 ENCOUNTER — Ambulatory Visit: Payer: Medicare Other | Attending: Internal Medicine

## 2019-12-08 DIAGNOSIS — Z23 Encounter for immunization: Secondary | ICD-10-CM | POA: Insufficient documentation

## 2019-12-08 NOTE — Progress Notes (Signed)
   Covid-19 Vaccination Clinic  Name:  Merik Morelan    MRN: [id number] DOB: 1950-11-30  12/08/2019  Mr. Borden was observed post Covid-19 immunization for 15 minutes without incidence. He was provided with Vaccine Information Sheet and instruction to access the V-Safe system.   Mr. Galinski was instructed to call 911 with any severe reactions post vaccine: [name] Difficulty breathing  . Swelling of your face and throat  . A fast heartbeat  . A bad rash all over your body  . Dizziness and weakness    Immunizations Administered    Name Date Dose VIS Date Route   Pfizer COVID-19 Vaccine 12/08/2019  8:56 AM 0.3 mL 09/27/2019 Intramuscular   Manufacturer: [place]   Lot: [id number]   [place]: [id number]

## 2019-12-16 NOTE — Progress Notes (Signed)
HEMATOLOGY/ONCOLOGY CLINIC NOTE  Date of Service: 12/17/19     Patient Care Team: [name], MD as PCP - General (Family Medicine) [name], MD as Consulting Physician (Hematology) [name], MD as Attending Physician (Radiation Oncology) [name], RN as Oncology Nurse Navigator (Oncology) [name] MD ([facility] _ primary oncology) phone number [phone] Email- [email or web address]  CHIEF COMPLAINTS  followup of multiple myeloma.  HISTORY OF PRESENTING ILLNESS:   Michael [name] is a wonderful 69 y.o. 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 Patient is currently on a study protocol and is being monitored for myeloma recurrence.  Based on available records being put together  -Patient was diagnosed with IgG kappa ISS stage I multiple myeloma with hyperdiploid complex cytogenetics in 2012. Bone marrow biopsy apparently showed 50% kappa restricted plasma cells with an initial M spike of 3.4 g/dL with evidence of lytic lesions on his PET/CT scan including right rib fracture and lesions of the lumbar and cervical spine. -Patient was treated under protocol 11-C-0221 with from 02/21/2013 with  Carfilzomib/Revlimid/Dexamethasone for a total of 8 cycles. He reports that his stem cells were collected and stored after 4 cycles -Posttreatment bone marrow biopsy showed less than 5% plasma cells with a negative flow cytometry and improvement in his previously PET avid lesions. Some concern for new focus of activity at C2 lytic lesion at L4 and several small lesions throughout the vertebrae. -Patient was on maintenance lenalidomide 10 mg by  mouth daily for 2 years or more until end of 2014. 08/19/2013 - bone marrow biopsy showed normocellular marrow with less than 5% plasma cells. PET CT scan showed decreased focal metabolic ligament prior rib lesions and no new lesions. Flow cytometry was negative for residual disease. Serum and urine electrophoresis and IFE showed no evidence of monoclonal protein. -Patient notes that he was off protocol for a few years due to lack of clinical trial research funding. -Patient notes that he is back on a follow-up protocol and continues to follow and receive his primary treatment at [facility] at this time. -Patient reports he had his last PET CT scan blood and urine tests and bone marrow examination in February 2018. The results of which are not available to [place] currently.  He notes that there was concern for a very slowly growing sternal FDG avid lesion that has been present for years. He has a follow-up at [facility] next month to determine the management of this lesion. He reports that was some consideration of considering radiation. he notes that this lesion has not been biopsied. No other focal bone pains at this time.  We discussed in detail about what our role will be and noted that we shall be available for any acute issues that need to be taken care of locally but that at this time the primary treatment and evaluation will be driven by his team at [facility] as per their research protocols and input. The patient and his wife are clearly aware of this and are in agreement with this plan.  INTERVAL HISTORY  Michael [name] is here for continued management of his Multiple Myeloma. He is here for C15D15 of maintenance Carfilzomib. The patient's last visit  with [place] was on 11/20/2019. The pt reports that he is doing well overall.  The pt reports that he has been well and has had no new concerns in the interim. Pt has received his first dose of the COVID19 vaccine and has the second dose scheduled. He denies  any residual symptoms.   Lab results today (12/17/19) of CBC w/diff and CMP is as follows: all values are WNL except for RBC at 3.72, Hgb at 12.4, HCT at 36.2, Lymphs Abs at 0.6K, Glucose at 126, Calcium at 8.5, Total Protein at 6.1, AST at 13. 12/17/2019 Vitamin D 25 hydroxy at 50.43  12/17/2019 MMP is in progress   On review of systems, pt reports chronic back pain and denies any other symptoms.   MEDICAL HISTORY:  Past Medical History:  Diagnosis Date  . Angioedema   . CPAP (continuous positive airway pressure) dependence    uses at night  . Gastroesophageal reflux disease without esophagitis   . History of radiation therapy 06/04/18- 06/15/18   25 Gy in 10 fractions to the laryngeal/ cartilage lesion  . Hyperlipidemia   . Multiple myeloma ([place]) 2012   Treated at [facility]  . Non morbid obesity due to excess calories   . Prediabetes   . Prediabetes   . Unspecified glaucoma(365.9)   . Urticaria, unspecified   Diabetes mellitus Glaucoma Left toes neuropathy  GERD  SURGICAL HISTORY: Past Surgical History:  Procedure Laterality Date  . IR RADIOLOGIST EVAL & MGMT  11/13/2018  . PORTACATH PLACEMENT    . PORTACATH PLACEMENT     removed 02/2016    SOCIAL HISTORY: Social History   Socioeconomic History  . Marital status: Married    Spouse name: Not on file  . Number of children: 2  . Years of education: Not on file  . Highest education level: Not on file  Occupational History  . Occupation: Retired Chief Strategy Officer for [employer]  . Smoking status: Never Smoker  . Smokeless tobacco: Never Used  Substance and Sexual Activity  . Alcohol use: Yes    Alcohol/week: 0.0 standard drinks    Comment: Occasional beer  . Drug use: No  . Sexual activity: Not on file  Other Topics Concern  . Not on file  Social History Narrative   Unable to [employer] partner violence- partner in room    Social Determinants of Health   Financial Resource Strain:   . Difficulty of  Paying Living Expenses: Not on file  Food Insecurity:   . Worried About Charity fundraiser in the Last Year: Not on file  . Ran Out of Food in the Last Year: Not on file  Transportation Needs:   . Lack of Transportation (Medical): Not on file  . Lack of Transportation (Non-Medical): Not on file  Physical Activity:   . Days of Exercise per Week: Not on file  . Minutes of Exercise per Session: Not on file  Stress:   . Feeling of Stress : Not on file  Social Connections:   . Frequency of Communication with Friends and Family: Not on file  . Frequency of Social Gatherings with Friends and Family: Not on file  . Attends Religious Services: Not on file  . Active Member of Clubs or Organizations: Not on file  . Attends Archivist Meetings: Not on file  . Marital Status: Not on file  Intimate Partner Violence:   . Fear of Current or Ex-Partner: Not on file  .  Emotionally Abused: Not on file  . Physically Abused: Not on file  . Sexually Abused: Not on file  Never smoker Married Retired from the [employer] in June 2017 and moved to [facility].  FAMILY HISTORY: Family History  Problem Relation Age of Onset  . Cancer Father     ALLERGIES:  has No Known Allergies.  MEDICATIONS:  Current Outpatient Medications  Medication Sig Dispense Refill  . acyclovir (ZOVIRAX) 400 MG tablet Take 1 tablet by mouth twice daily 60 tablet 0  . aspirin 81 MG tablet Take 81 mg by mouth daily.    [name] atorvastatin (LIPITOR) 20 MG tablet Take 20 mg by mouth every evening.     . Calcium Citrate-Vitamin D (CALCIUM CITRATE + D3 MAXIMUM PO) Take 630 mg by mouth.     . dexamethasone (DECADRON) 4 MG tablet 26m (5 tabs) with breakfast the day after each daratumumab treatment 20 tablet 4  . [START ON 12/19/2019] ergocalciferol (VITAMIN D2) 1.25 MG (50000 UT) capsule Take 1 capsule (50,000 Units total) by mouth 2 (two) times a week. 24 capsule 3  . glucose blood (FREESTYLE LITE) test strip      . Multiple Vitamin (MULTIVITAMIN) tablet Take 1 tablet by mouth daily.    . ondansetron (ZOFRAN) 8 MG tablet Take 1 tablet (8 mg total) by mouth 2 (two) times daily as needed (Nausea or vomiting). 30 tablet 1  . oxyCODONE (OXY IR/ROXICODONE) 5 MG immediate release tablet Take 1-2 tablets (5-10 mg total) by mouth every 4 (four) hours as needed for severe pain or breakthrough pain. (Patient taking differently: Take 5-10 mg by mouth every 4 (four) hours as needed for severe pain or breakthrough pain. Patient reports taking only as needed - rarely) 60 tablet 0  . pantoprazole (PROTONIX) 40 MG tablet Take 40 mg by mouth daily.     . prochlorperazine (COMPAZINE) 10 MG tablet Take 1 tablet (10 mg total) by mouth every 6 (six) hours as needed (Nausea or vomiting). 30 tablet 1  . senna-docusate (SENOKOT-S) 8.6-50 MG tablet Take 2 tablets by mouth at bedtime. Takes 50 mg (Patient not taking: Reported on 12/17/2019) 60 tablet 3   No current facility-administered medications for this visit.    REVIEW OF SYSTEMS:   A 10+ POINT REVIEW OF SYSTEMS WAS OBTAINED including neurology, dermatology, psychiatry, cardiac, respiratory, lymph, extremities, GI, GU, Musculoskeletal, constitutional, breasts, reproductive, HEENT.  All pertinent positives are noted in the HPI.  All others are negative.   PHYSICAL EXAMINATION: ECOG PERFORMANCE STATUS: 1 - Symptomatic but completely ambulatory  Vitals:   12/17/19 0924  BP: (!) 147/84  Pulse: 65  Resp: 18  Temp: 98.5 F (36.9 C)  TempSrc: Temporal  SpO2: 99%  Weight: 252 lb 6.4 oz (114.5 kg)  Height: 6' 1"  (1.854 m)   Body mass index is 33.3 kg/m.   GENERAL:alert, in no acute distress and comfortable SKIN: no acute rashes, no significant lesions EYES: conjunctiva are pink and non-injected, sclera anicteric OROPHARYNX: MMM, no exudates, no oropharyngeal erythema or ulceration NECK: supple, no JVD LYMPH:  no palpable lymphadenopathy in the cervical, axillary or  inguinal regions LUNGS: clear to auscultation b/l with normal respiratory effort HEART: regular rate & rhythm ABDOMEN:  normoactive bowel sounds , non tender, not distended. No palpable hepatosplenomegaly.  Extremity: no pedal edema PSYCH: alert & oriented x 3 with fluent speech NEURO: no focal motor/sensory deficits  LABORATORY DATA:  I have reviewed the data as listed . CBC Latest  Ref Rng & Units 12/17/2019 12/04/2019 11/20/2019  WBC 4.0 - 10.5 K/uL 4.1 4.5 4.9  Hemoglobin 13.0 - 17.0 g/dL 12.4(L) 13.2 13.1  Hematocrit 39.0 - 52.0 % 36.2(L) 38.8(L) 39.2  Platelets 150 - 400 K/uL 250 197 263    . CMP Latest Ref Rng & Units 12/17/2019 12/04/2019 11/20/2019  Glucose 70 - 99 mg/dL 126(H) 111(H) 121(H)  BUN 8 - 23 mg/dL 10 13 11   Creatinine 0.61 - 1.24 mg/dL 0.82 0.81 0.85  Sodium 135 - 145 mmol/L 142 143 142  Potassium 3.5 - 5.1 mmol/L 3.7 3.8 3.8  Chloride 98 - 111 mmol/L 108 107 108  CO2 22 - 32 mmol/L 24 25 25   Calcium 8.9 - 10.3 mg/dL 8.5(L) 9.2 9.1  Total Protein 6.5 - 8.1 g/dL 6.1(L) 6.3(L) 6.2(L)  Total Bilirubin 0.3 - 1.2 mg/dL 0.8 1.0 0.7  Alkaline Phos 38 - 126 U/L 63 66 71  AST 15 - 41 U/L 13(L) 14(L) 12(L)  ALT 0 - 44 U/L 18 19 17    08/16/2019 BM Bx Report ([id number]):   08/16/2019 FISH Panel:      05/08/18 Left Neck Thyroid Cartilage Biopsy:        RADIOGRAPHIC STUDIES: I have personally reviewed the radiological images as listed and agreed with the findings in the report. No results found.  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 Bone marrow biopsy apparently showed 50% kappa restricted plasma cells with an initial M spike of 3.4 g/dL with evidence of lytic lesions on his PET/CT scan including right rib fracture and lesions of the lumbar and cervical spine. -Patient was treated under  protocol 11-C-0221 with from 02/21/2013 with  Carfilzomib/Revlimid/Dexamethasone for a total of 8 cycles. He reports that his stem cells were collected and stored after 4 cycles -Posttreatment bone marrow biopsy showed less than 5% plasma cells with a negative flow cytometry and improvement in his previously PET avid lesions. Some concern for new focus of activity at C2 lytic lesion at L4 and several small lesions throughout the vertebrae. -Patient was on maintenance lenalidomide 10 mg by mouth daily for 2 years or more until end of 2014. 08/19/2013 - bone marrow biopsy showed normocellular marrow with less than 5% plasma cells. PET CT scan showed decreased focal metabolic ligament prior rib lesions and no new lesions. Flow cytometry was negative for residual disease. Serum and urine electrophoresis and IFE showed no evidence of monoclonal protein. -Patient notes that he was off protocol for a few years due to lack of clinical trial research funding. -Patient returned to follow-up protocol and began to follow and receive his primary treatment at [facility]  -Patient's labs show minimal IgG kappa protein on IFE and a CT chest in May 2018 that did not show any overt signs of myeloma progression in the bones.  Rpt Myeloma labs 03/2017 - M spike of 0.3g/dl with a repeat M spike stable at 0.3 g/dL on 05/23/2017 which remains stable on labs today 07/25/17 Serum kappa lambda free light chain ratio not significantly changed. PET/CT scan showed a single metabolically active sternal lesion without any overt bony destruction. Bone marrow biopsy done on 06/06/2017 - shows no overt involvement with multiple myeloma.  04/24/18 CT Soft Tissue Neck revealed Left laryngeal 3 cm soft tissue mass appears to have originated within and is expanding the left  thyroid cartilage. This is new since the August 2018 PET-CT, and there is absent lymphadenopathy. 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  Primary cartilaginous neoplasm, squamous cell carcinoma, and other differential considerations are less likely. Superimposed widespread multiple myeloma lesions in the visible skeleton.  05/08/18 Left neck thyroid cartilage biopsy revealed Plasma cell neoplasm.  05/22/18 PET/CT revealed Soft tissue mass centered around the destroyed left thyroid cartilage is hypermetabolic and consistent with known plasmacytoma. No adenopathy in the neck. 2. Hypermetabolic 3 cm cutaneous and subcutaneous soft tissue mass involving the posterior upper thorax suspicious for cutaneous manifestation of myeloma (plasmacytoma). 3. Persistent and slightly progressive hypermetabolic sternal lesion. 4. Diffuse lytic myelomatous lesions throughout the bony structures but no other hypermetabolic foci. 5. Focus of hypermetabolism in the left aspect of the lower prostate gland, not present on the prior study. This could be an area of infection/inflammation or potential prostate cancer. Recommend correlation with physical examination and PSA level.    Completed RT between 06/04/18 and 06/15/18 of 25 Gy by 10 fractions  The pt pursued a clinical trial at the [facility] with RT and immunotherapy in October 2019 through October 18, 2018 with further information available at [email or web address]  10/18/18 M Protein increased to 3.5g, from 0.7g at our last visit on 07/10/18. 10/02/18 PET/CT from Home indicated new hypermetabolism in left clavicle, left scapula, bilateral ribs, spine, and left proximal femur 10/18/18 MRI from [facility] ndicated a pathologic fracture at T12  10/2018 M Protein at 4.1g  11/23/18 NM Bone revealed Patchy uptake throughout the ribs and spine with areas of focally increased activity in both T12 pedicles and the right L1 pedicle. This focal activity could be stress related without clear corresponding fracture on available prior studies.  08/16/2019 FISH Panel which revealed "No mutations detected."  08/16/2019  BM Bx Surgical Pathology [phone]) which revealed  "DIAGNOSIS: BONE MARROW, ASPIRATE, CLOT, CORE: -Hypercellular bone marrow for age with trilineage hematopoiesis and less than 1% plasma cells PERIPHERAL BLOOD: -Macrocytic anemia -Leukocytosis ".  09/03/2019 PET/CT scan ([id number]) revealed  "1. No evidence of active multiple myeloma on whole-body FDG PET scan. 2. Stable lytic lesions within the spine and sternum consistent treated metastasis. 3. No soft tissue plasmacytoma. 4. New compression fracture at T12 compared to PET-CT scan 10/02/2018."  2) h/o Elevated bilirubin level . ? [place]  3) PET/CT abnormality in the Prostate. PSA levels WNL -Primary care physician to consider urology referral  4) Abnormal focus of FDG avid at a spot in the liver - will need to be monitor on f/u scans. No pain or other symptoms at this time.  PLAN:  -Discussed pt labwork today, 12/17/19; blood counts and chemistries are steady  -Discussed 12/17/2019 Vitamin D 25 hydroxy at 50.43 - would like to keep Vitamin D higher  -Discussed 12/17/2019 MMP is in progress, last M Protein at 0.1 g/dL -Will begin Ergocalciferol twice per week from a bone health standpoint -Recommend pt f/u as scheduled for the second dose of the COVID19 vaccine -The pt has no prohibitive toxicities from continuing C15D15 of Carfilzomib at this time. -Continue 70 mg/m^2 of Carfilzomib capped with BSA of 2.2  -Will continue Carfilzomib every 2 weeks for maintenance, until progression or intolerance -Will give C16D1 on 03/19 due to 03/17 COVID19 vaccine appointment  -Pt is still interested in discussing a transplant further. Will consider a referral in May.  -Continue Zometa every 3 months  -Continue Acyclovir -Refill Ergocalciferol  -Will see back in 4  weeks with labs   FOLLOW UP: -Plz move next labs and dose of Carfilzomib from 3/17 to 3/19 due to overlap with COVID 19 vaccination -Can keep the same appointments for subsequent  Carfilzomib doses on 3/31 with labs and MD visit -Continue Zometa every 3 months    All of the patient's questions were answered with apparent satisfaction. The patient knows to call the clinic with any problems, questions or concerns.    Sullivan Lone MD [place] AAHIVMS [facility] Hematology/Oncology Physician [facility]  (Office):       [phone] (Work cell):  [phone] (Fax):           [phone]   I, Yevette Edwards, am acting as a scribe for Dr. Sullivan Lone.   .I have reviewed the above documentation for accuracy and completeness, and I agree with the above. [name] MD

## 2019-12-17 ENCOUNTER — Inpatient Hospital Stay: Payer: Medicare Other

## 2019-12-17 ENCOUNTER — Inpatient Hospital Stay: Payer: Medicare Other | Attending: Hematology

## 2019-12-17 ENCOUNTER — Other Ambulatory Visit: Payer: Self-pay

## 2019-12-17 ENCOUNTER — Inpatient Hospital Stay (HOSPITAL_BASED_OUTPATIENT_CLINIC_OR_DEPARTMENT_OTHER): Payer: Medicare Other | Admitting: Hematology

## 2019-12-17 ENCOUNTER — Other Ambulatory Visit: Payer: Medicare Other

## 2019-12-17 VITALS — BP 147/84 | HR 65 | Temp 98.5°F | Resp 18 | Ht 73.0 in | Wt 252.4 lb

## 2019-12-17 DIAGNOSIS — C9000 Multiple myeloma not having achieved remission: Secondary | ICD-10-CM | POA: Diagnosis not present

## 2019-12-17 DIAGNOSIS — Z7189 Other specified counseling: Secondary | ICD-10-CM

## 2019-12-17 DIAGNOSIS — Z5112 Encounter for antineoplastic immunotherapy: Secondary | ICD-10-CM | POA: Diagnosis not present

## 2019-12-17 DIAGNOSIS — E559 Vitamin D deficiency, unspecified: Secondary | ICD-10-CM

## 2019-12-17 DIAGNOSIS — Z79899 Other long term (current) drug therapy: Secondary | ICD-10-CM | POA: Insufficient documentation

## 2019-12-17 LAB — CMP (CANCER CENTER ONLY)
ALT: 18 U/L (ref 0–44)
AST: 13 U/L — ABNORMAL LOW (ref 15–41)
Albumin: 3.9 g/dL (ref 3.5–5.0)
Alkaline Phosphatase: 63 U/L (ref 38–126)
Anion gap: 10 (ref 5–15)
BUN: 10 mg/dL (ref 8–23)
CO2: 24 mmol/L (ref 22–32)
Calcium: 8.5 mg/dL — ABNORMAL LOW (ref 8.9–10.3)
Chloride: 108 mmol/L (ref 98–111)
Creatinine: 0.82 mg/dL (ref 0.61–1.24)
GFR, Est AFR Am: >60 mL/min (ref >60)
GFR, Estimated: >60 mL/min (ref >60)
Glucose, Bld: 126 mg/dL — ABNORMAL HIGH (ref 70–99)
Potassium: 3.7 mmol/L (ref 3.5–5.1)
Sodium: 142 mmol/L (ref 135–145)
Total Bilirubin: 0.8 mg/dL (ref 0.3–1.2)
Total Protein: 6.1 g/dL — ABNORMAL LOW (ref 6.5–8.1)

## 2019-12-17 LAB — CBC WITH DIFFERENTIAL/PLATELET
Abs Immature Granulocytes: 0.03 10*3/uL (ref 0.00–0.07)
Basophils Absolute: 0.1 10*3/uL (ref 0.0–0.1)
Basophils Relative: 1 %
Eosinophils Absolute: 0.2 10*3/uL (ref 0.0–0.5)
Eosinophils Relative: 4 %
HCT: 36.2 % — ABNORMAL LOW (ref 39.0–52.0)
Hemoglobin: 12.4 g/dL — ABNORMAL LOW (ref 13.0–17.0)
Immature Granulocytes: 1 %
Lymphocytes Relative: 15 %
Lymphs Abs: 0.6 10*3/uL — ABNORMAL LOW (ref 0.7–4.0)
MCH: 33.3 pg (ref 26.0–34.0)
MCHC: 34.3 g/dL (ref 30.0–36.0)
MCV: 97.3 fL (ref 80.0–100.0)
Monocytes Absolute: 0.5 10*3/uL (ref 0.1–1.0)
Monocytes Relative: 11 %
Neutro Abs: 2.8 10*3/uL (ref 1.7–7.7)
Neutrophils Relative %: 68 %
Platelets: 250 10*3/uL (ref 150–400)
RBC: 3.72 MIL/uL — ABNORMAL LOW (ref 4.22–5.81)
RDW: 13.3 % (ref 11.5–15.5)
WBC: 4.1 10*3/uL (ref 4.0–10.5)
nRBC: 0 % (ref 0.0–0.2)

## 2019-12-17 LAB — VITAMIN D 25 HYDROXY (VIT D DEFICIENCY, FRACTURES): Vit D, 25-Hydroxy: 50.43 ng/mL (ref 30–100)

## 2019-12-17 MED ORDER — ACETAMINOPHEN 325 MG PO TABS
650.0000 mg | ORAL_TABLET | Freq: Once | ORAL | Status: AC
  Administered 2019-12-17: 650 mg via ORAL

## 2019-12-17 MED ORDER — DEXTROSE 5 % IV SOLN: 154.0000 mg | Freq: Once | INTRAVENOUS | Status: DC

## 2019-12-17 MED ORDER — DIPHENHYDRAMINE HCL 25 MG PO CAPS
50.0000 mg | ORAL_CAPSULE | Freq: Once | ORAL | Status: AC
  Administered 2019-12-17: 50 mg via ORAL

## 2019-12-17 MED ORDER — DIPHENHYDRAMINE HCL 25 MG PO CAPS
ORAL_CAPSULE | ORAL | Status: AC
  Filled 2019-12-17: qty 2

## 2019-12-17 MED ORDER — DEXTROSE 5 % IV SOLN
150.0000 mg | Freq: Once | INTRAVENOUS | Status: AC
  Administered 2019-12-17: 150 mg via INTRAVENOUS
  Filled 2019-12-17: qty 60

## 2019-12-17 MED ORDER — ACETAMINOPHEN 325 MG PO TABS
ORAL_TABLET | ORAL | Status: AC
  Filled 2019-12-17: qty 2

## 2019-12-17 MED ORDER — PROCHLORPERAZINE MALEATE 10 MG PO TABS
10.0000 mg | ORAL_TABLET | Freq: Once | ORAL | Status: AC
  Administered 2019-12-17: 10 mg via ORAL

## 2019-12-17 MED ORDER — SODIUM CHLORIDE 0.9 % IV SOLN
Freq: Once | INTRAVENOUS | Status: AC
  Filled 2019-12-17: qty 250

## 2019-12-17 MED ORDER — PROCHLORPERAZINE MALEATE 10 MG PO TABS
ORAL_TABLET | ORAL | Status: AC
  Filled 2019-12-17: qty 1

## 2019-12-17 MED ORDER — METHYLPREDNISOLONE SODIUM SUCC 125 MG IJ SOLR
INTRAMUSCULAR | Status: AC
  Filled 2019-12-17: qty 2

## 2019-12-17 MED ORDER — ERGOCALCIFEROL 1.25 MG (50000 UT) PO CAPS: 50000.0000 [IU] | ORAL_CAPSULE | ORAL | 3 refills | Status: DC

## 2019-12-17 MED ORDER — METHYLPREDNISOLONE SODIUM SUCC 125 MG IJ SOLR
60.0000 mg | Freq: Once | INTRAMUSCULAR | Status: AC
  Administered 2019-12-17: 60 mg via INTRAVENOUS

## 2019-12-18 ENCOUNTER — Ambulatory Visit: Payer: Medicare Other

## 2019-12-20 LAB — MULTIPLE MYELOMA PANEL, SERUM
Albumin SerPl Elph-Mcnc: 3.8 g/dL (ref 2.9–4.4)
Albumin/Glob SerPl: 2.2 — ABNORMAL HIGH (ref 0.7–1.7)
Alpha 1: 0.2 g/dL (ref 0.0–0.4)
Alpha2 Glob SerPl Elph-Mcnc: 0.5 g/dL (ref 0.4–1.0)
B-Globulin SerPl Elph-Mcnc: 0.8 g/dL (ref 0.7–1.3)
Gamma Glob SerPl Elph-Mcnc: 0.3 g/dL — ABNORMAL LOW (ref 0.4–1.8)
Globulin, Total: 1.8 g/dL — ABNORMAL LOW (ref 2.2–3.9)
IgA: 5 mg/dL — ABNORMAL LOW (ref 61–437)
IgG (Immunoglobin G), Serum: 367 mg/dL — ABNORMAL LOW (ref 603–1613)
IgM (Immunoglobulin M), Srm: <5 mg/dL — ABNORMAL LOW (ref 20–172)
M Protein SerPl Elph-Mcnc: 0.2 g/dL — ABNORMAL HIGH
Total Protein ELP: 5.6 g/dL — ABNORMAL LOW (ref 6.0–8.5)

## 2019-12-28 ENCOUNTER — Other Ambulatory Visit: Payer: Self-pay | Admitting: Hematology

## 2019-12-28 DIAGNOSIS — Z7189 Other specified counseling: Secondary | ICD-10-CM

## 2019-12-28 DIAGNOSIS — C9000 Multiple myeloma not having achieved remission: Secondary | ICD-10-CM

## 2019-12-30 MED ORDER — ACYCLOVIR 400 MG PO TABS: 400.0000 mg | ORAL_TABLET | Freq: Two times a day (BID) | ORAL | 0 refills | Status: DC

## 2020-01-01 ENCOUNTER — Ambulatory Visit: Payer: Medicare Other

## 2020-01-01 ENCOUNTER — Other Ambulatory Visit: Payer: Medicare Other

## 2020-01-01 ENCOUNTER — Ambulatory Visit: Payer: Medicare Other | Attending: Internal Medicine

## 2020-01-01 DIAGNOSIS — Z23 Encounter for immunization: Secondary | ICD-10-CM

## 2020-01-01 NOTE — Progress Notes (Signed)
   Covid-19 Vaccination Clinic  Name:  Darren Leinen    MRN: [id number] DOB: 01-07-1951  01/01/2020  Mr. Langridge was observed post Covid-19 immunization for 15 minutes without incident. He was provided with Vaccine Information Sheet and instruction to access the V-Safe system.   Mr. Holdman was instructed to call 911 with any severe reactions post vaccine: [name] Difficulty breathing  . Swelling of face and throat  . A fast heartbeat  . A bad rash all over body  . Dizziness and weakness   Immunizations Administered    Name Date Dose VIS Date Route   Pfizer COVID-19 Vaccine 01/01/2020  8:46 AM 0.3 mL 09/27/2019 Intramuscular   Manufacturer: [place]   Lot: [id number]   [place]: [id number]

## 2020-01-03 ENCOUNTER — Inpatient Hospital Stay: Payer: Medicare Other

## 2020-01-03 ENCOUNTER — Other Ambulatory Visit: Payer: Self-pay

## 2020-01-03 VITALS — BP 148/88 | HR 87 | Temp 98.0°F | Resp 20

## 2020-01-03 DIAGNOSIS — Z79899 Other long term (current) drug therapy: Secondary | ICD-10-CM | POA: Diagnosis not present

## 2020-01-03 DIAGNOSIS — C9000 Multiple myeloma not having achieved remission: Secondary | ICD-10-CM

## 2020-01-03 DIAGNOSIS — Z7189 Other specified counseling: Secondary | ICD-10-CM

## 2020-01-03 DIAGNOSIS — E559 Vitamin D deficiency, unspecified: Secondary | ICD-10-CM

## 2020-01-03 DIAGNOSIS — Z5112 Encounter for antineoplastic immunotherapy: Secondary | ICD-10-CM | POA: Diagnosis not present

## 2020-01-03 LAB — CMP (CANCER CENTER ONLY)
ALT: 22 U/L (ref 0–44)
AST: 18 U/L (ref 15–41)
Albumin: 4 g/dL (ref 3.5–5.0)
Alkaline Phosphatase: 74 U/L (ref 38–126)
Anion gap: 9 (ref 5–15)
BUN: 14 mg/dL (ref 8–23)
CO2: 26 mmol/L (ref 22–32)
Calcium: 9.2 mg/dL (ref 8.9–10.3)
Chloride: 106 mmol/L (ref 98–111)
Creatinine: 0.82 mg/dL (ref 0.61–1.24)
GFR, Est AFR Am: >60 mL/min (ref >60)
GFR, Estimated: >60 mL/min (ref >60)
Glucose, Bld: 109 mg/dL — ABNORMAL HIGH (ref 70–99)
Potassium: 3.8 mmol/L (ref 3.5–5.1)
Sodium: 141 mmol/L (ref 135–145)
Total Bilirubin: 1.5 mg/dL — ABNORMAL HIGH (ref 0.3–1.2)
Total Protein: 6.1 g/dL — ABNORMAL LOW (ref 6.5–8.1)

## 2020-01-03 LAB — CBC WITH DIFFERENTIAL/PLATELET
Abs Immature Granulocytes: 0.02 10*3/uL (ref 0.00–0.07)
Basophils Absolute: 0.1 10*3/uL (ref 0.0–0.1)
Basophils Relative: 1 %
Eosinophils Absolute: 0.1 10*3/uL (ref 0.0–0.5)
Eosinophils Relative: 3 %
HCT: 38 % — ABNORMAL LOW (ref 39.0–52.0)
Hemoglobin: 12.6 g/dL — ABNORMAL LOW (ref 13.0–17.0)
Immature Granulocytes: 1 %
Lymphocytes Relative: 15 %
Lymphs Abs: 0.6 10*3/uL — ABNORMAL LOW (ref 0.7–4.0)
MCH: 32.8 pg (ref 26.0–34.0)
MCHC: 33.2 g/dL (ref 30.0–36.0)
MCV: 99 fL (ref 80.0–100.0)
Monocytes Absolute: 0.6 10*3/uL (ref 0.1–1.0)
Monocytes Relative: 15 %
Neutro Abs: 2.5 10*3/uL (ref 1.7–7.7)
Neutrophils Relative %: 65 %
Platelets: 207 10*3/uL (ref 150–400)
RBC: 3.84 MIL/uL — ABNORMAL LOW (ref 4.22–5.81)
RDW: 13.4 % (ref 11.5–15.5)
WBC: 3.8 10*3/uL — ABNORMAL LOW (ref 4.0–10.5)
nRBC: 0 % (ref 0.0–0.2)

## 2020-01-03 LAB — VITAMIN D 25 HYDROXY (VIT D DEFICIENCY, FRACTURES): Vit D, 25-Hydroxy: 65.84 ng/mL (ref 30–100)

## 2020-01-03 MED ORDER — DIPHENHYDRAMINE HCL 25 MG PO CAPS
ORAL_CAPSULE | ORAL | Status: AC
  Filled 2020-01-03: qty 2

## 2020-01-03 MED ORDER — DIPHENHYDRAMINE HCL 25 MG PO CAPS
50.0000 mg | ORAL_CAPSULE | Freq: Once | ORAL | Status: AC
  Administered 2020-01-03: 50 mg via ORAL

## 2020-01-03 MED ORDER — METHYLPREDNISOLONE SODIUM SUCC 125 MG IJ SOLR
60.0000 mg | Freq: Once | INTRAMUSCULAR | Status: AC
  Administered 2020-01-03: 60 mg via INTRAVENOUS

## 2020-01-03 MED ORDER — SODIUM CHLORIDE 0.9 % IV SOLN
INTRAVENOUS | Status: DC
  Filled 2020-01-03: qty 250

## 2020-01-03 MED ORDER — DEXTROSE 5 % IV SOLN
150.0000 mg | Freq: Once | INTRAVENOUS | Status: AC
  Administered 2020-01-03: 150 mg via INTRAVENOUS
  Filled 2020-01-03: qty 60

## 2020-01-03 MED ORDER — SODIUM CHLORIDE 0.9 % IV SOLN
Freq: Once | INTRAVENOUS | Status: AC
  Filled 2020-01-03: qty 250

## 2020-01-03 MED ORDER — ACETAMINOPHEN 325 MG PO TABS
650.0000 mg | ORAL_TABLET | Freq: Once | ORAL | Status: AC
  Administered 2020-01-03: 650 mg via ORAL

## 2020-01-03 MED ORDER — PROCHLORPERAZINE MALEATE 10 MG PO TABS
ORAL_TABLET | ORAL | Status: AC
  Filled 2020-01-03: qty 1

## 2020-01-03 MED ORDER — METHYLPREDNISOLONE SODIUM SUCC 125 MG IJ SOLR
INTRAMUSCULAR | Status: AC
  Filled 2020-01-03: qty 2

## 2020-01-03 MED ORDER — PROCHLORPERAZINE MALEATE 10 MG PO TABS
10.0000 mg | ORAL_TABLET | Freq: Once | ORAL | Status: AC
  Administered 2020-01-03: 10 mg via ORAL

## 2020-01-03 MED ORDER — ACETAMINOPHEN 325 MG PO TABS
ORAL_TABLET | ORAL | Status: AC
  Filled 2020-01-03: qty 2

## 2020-01-03 NOTE — Patient Instructions (Signed)
[facility] Discharge Instructions for Patients Receiving Chemotherapy  Today you received the following chemotherapy agents Carfilzomib (KYPROLIS).  To help prevent nausea and vomiting after your treatment, we encourage you to take your nausea medication as prescribed.  If you develop nausea and vomiting that is not controlled by your nausea medication, call the clinic.   BELOW ARE SYMPTOMS THAT SHOULD BE REPORTED IMMEDIATELY:  *FEVER GREATER THAN 100.5 F  *CHILLS WITH OR WITHOUT FEVER  NAUSEA AND VOMITING THAT IS NOT CONTROLLED WITH YOUR NAUSEA MEDICATION  *UNUSUAL SHORTNESS OF BREATH  *UNUSUAL BRUISING OR BLEEDING  TENDERNESS IN MOUTH AND THROAT WITH OR WITHOUT PRESENCE OF ULCERS  *URINARY PROBLEMS  *BOWEL PROBLEMS  UNUSUAL RASH Items with * indicate a potential emergency and should be followed up as soon as possible.  Feel free to call the clinic should you have any questions or concerns. The clinic phone number is [phone].  Please show the CHEMO ALERT CARD at check-in to the Emergency Department and triage nurse.   

## 2020-01-09 NOTE — Progress Notes (Signed)
Pharmacist Chemotherapy Monitoring - Follow Up Assessment    I verify that I have reviewed each item in the below checklist:  . Regimen for the patient is scheduled for the appropriate day and plan matches scheduled date. [name] Appropriate non-routine labs are ordered dependent on drug ordered. . If applicable, additional medications reviewed and ordered per protocol based on lifetime cumulative doses and/or treatment regimen.   Plan for follow-up and/or issues identified: No . I-vent associated with next due treatment: No . MD and/or nursing notified: No  Britt Boozer 01/09/2020 11:02 AM

## 2020-01-13 DIAGNOSIS — L720 Epidermal cyst: Secondary | ICD-10-CM | POA: Diagnosis not present

## 2020-01-13 DIAGNOSIS — L723 Sebaceous cyst: Secondary | ICD-10-CM | POA: Diagnosis not present
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
## 2020-01-15 ENCOUNTER — Other Ambulatory Visit: Payer: Self-pay

## 2020-01-15 ENCOUNTER — Other Ambulatory Visit: Payer: Medicare Other

## 2020-01-15 ENCOUNTER — Inpatient Hospital Stay: Payer: Medicare Other

## 2020-01-15 ENCOUNTER — Inpatient Hospital Stay (HOSPITAL_BASED_OUTPATIENT_CLINIC_OR_DEPARTMENT_OTHER): Payer: Medicare Other | Admitting: Hematology

## 2020-01-15 VITALS — BP 149/72 | HR 64 | Temp 97.8°F | Resp 18 | Ht 73.0 in | Wt 254.8 lb

## 2020-01-15 DIAGNOSIS — Z79899 Other long term (current) drug therapy: Secondary | ICD-10-CM | POA: Diagnosis not present

## 2020-01-15 DIAGNOSIS — Z5112 Encounter for antineoplastic immunotherapy: Secondary | ICD-10-CM | POA: Diagnosis not present

## 2020-01-15 DIAGNOSIS — C9000 Multiple myeloma not having achieved remission: Secondary | ICD-10-CM

## 2020-01-15 DIAGNOSIS — E559 Vitamin D deficiency, unspecified: Secondary | ICD-10-CM

## 2020-01-15 DIAGNOSIS — Z7189 Other specified counseling: Secondary | ICD-10-CM

## 2020-01-15 LAB — CBC WITH DIFFERENTIAL/PLATELET
Abs Immature Granulocytes: 0.03 10*3/uL (ref 0.00–0.07)
Basophils Absolute: 0 10*3/uL (ref 0.0–0.1)
Basophils Relative: 1 %
Eosinophils Absolute: 0.2 10*3/uL (ref 0.0–0.5)
Eosinophils Relative: 4 %
HCT: 37.1 % — ABNORMAL LOW (ref 39.0–52.0)
Hemoglobin: 12.5 g/dL — ABNORMAL LOW (ref 13.0–17.0)
Immature Granulocytes: 1 %
Lymphocytes Relative: 16 %
Lymphs Abs: 0.6 10*3/uL — ABNORMAL LOW (ref 0.7–4.0)
MCH: 33.4 pg (ref 26.0–34.0)
MCHC: 33.7 g/dL (ref 30.0–36.0)
MCV: 99.2 fL (ref 80.0–100.0)
Monocytes Absolute: 0.4 10*3/uL (ref 0.1–1.0)
Monocytes Relative: 11 %
Neutro Abs: 2.6 10*3/uL (ref 1.7–7.7)
Neutrophils Relative %: 67 %
Platelets: 223 10*3/uL (ref 150–400)
RBC: 3.74 MIL/uL — ABNORMAL LOW (ref 4.22–5.81)
RDW: 13.2 % (ref 11.5–15.5)
WBC: 3.9 10*3/uL — ABNORMAL LOW (ref 4.0–10.5)
nRBC: 0 % (ref 0.0–0.2)

## 2020-01-15 LAB — CMP (CANCER CENTER ONLY)
ALT: 19 U/L (ref 0–44)
AST: 15 U/L (ref 15–41)
Albumin: 3.9 g/dL (ref 3.5–5.0)
Alkaline Phosphatase: 71 U/L (ref 38–126)
Anion gap: 9 (ref 5–15)
BUN: 9 mg/dL (ref 8–23)
CO2: 25 mmol/L (ref 22–32)
Calcium: 9 mg/dL (ref 8.9–10.3)
Chloride: 106 mmol/L (ref 98–111)
Creatinine: 0.86 mg/dL (ref 0.61–1.24)
GFR, Est AFR Am: >60 mL/min (ref >60)
GFR, Estimated: >60 mL/min (ref >60)
Glucose, Bld: 118 mg/dL — ABNORMAL HIGH (ref 70–99)
Potassium: 3.8 mmol/L (ref 3.5–5.1)
Sodium: 140 mmol/L (ref 135–145)
Total Bilirubin: 1.2 mg/dL (ref 0.3–1.2)
Total Protein: 6 g/dL — ABNORMAL LOW (ref 6.5–8.1)

## 2020-01-15 LAB — VITAMIN D 25 HYDROXY (VIT D DEFICIENCY, FRACTURES): Vit D, 25-Hydroxy: 77.59 ng/mL (ref 30–100)

## 2020-01-15 MED ORDER — PROCHLORPERAZINE MALEATE 10 MG PO TABS
ORAL_TABLET | ORAL | Status: AC
  Filled 2020-01-15: qty 1

## 2020-01-15 MED ORDER — HEPARIN SOD (PORK) LOCK FLUSH 100 UNIT/ML IV SOLN
500.0000 [IU] | Freq: Once | INTRAVENOUS | Status: DC | PRN
  Filled 2020-01-15: qty 5

## 2020-01-15 MED ORDER — DEXTROSE 5 % IV SOLN: 154.0000 mg | Freq: Once | INTRAVENOUS | Status: DC

## 2020-01-15 MED ORDER — SODIUM CHLORIDE 0.9% FLUSH
10.0000 mL | INTRAVENOUS | Status: DC | PRN
  Filled 2020-01-15: qty 10

## 2020-01-15 MED ORDER — SODIUM CHLORIDE 0.9 % IV SOLN
Freq: Once | INTRAVENOUS | Status: AC
  Filled 2020-01-15: qty 250

## 2020-01-15 MED ORDER — DEXTROSE 5 % IV SOLN
150.0000 mg | Freq: Once | INTRAVENOUS | Status: AC
  Administered 2020-01-15: 150 mg via INTRAVENOUS
  Filled 2020-01-15: qty 60

## 2020-01-15 MED ORDER — METHYLPREDNISOLONE SODIUM SUCC 125 MG IJ SOLR
60.0000 mg | Freq: Once | INTRAMUSCULAR | Status: AC
  Administered 2020-01-15: 60 mg via INTRAVENOUS

## 2020-01-15 MED ORDER — DIPHENHYDRAMINE HCL 25 MG PO CAPS
50.0000 mg | ORAL_CAPSULE | Freq: Once | ORAL | Status: AC
  Administered 2020-01-15: 50 mg via ORAL

## 2020-01-15 MED ORDER — PROCHLORPERAZINE MALEATE 10 MG PO TABS
10.0000 mg | ORAL_TABLET | Freq: Once | ORAL | Status: AC
  Administered 2020-01-15: 10 mg via ORAL

## 2020-01-15 MED ORDER — ACETAMINOPHEN 325 MG PO TABS
650.0000 mg | ORAL_TABLET | Freq: Once | ORAL | Status: AC
  Administered 2020-01-15: 650 mg via ORAL

## 2020-01-15 MED ORDER — ACETAMINOPHEN 325 MG PO TABS
ORAL_TABLET | ORAL | Status: AC
  Filled 2020-01-15: qty 2

## 2020-01-15 MED ORDER — METHYLPREDNISOLONE SODIUM SUCC 125 MG IJ SOLR
INTRAMUSCULAR | Status: AC
  Filled 2020-01-15: qty 2

## 2020-01-15 MED ORDER — DIPHENHYDRAMINE HCL 25 MG PO CAPS
ORAL_CAPSULE | ORAL | Status: AC
  Filled 2020-01-15: qty 2

## 2020-01-15 NOTE — Patient Instructions (Signed)
[facility] Discharge Instructions for Patients Receiving Chemotherapy  Today you received the following chemotherapy agents:  Kyprolis  To help prevent nausea and vomiting after your treatment, we encourage you to take your nausea medication as directed.   If you develop nausea and vomiting that is not controlled by your nausea medication, call the clinic.   BELOW ARE SYMPTOMS THAT SHOULD BE REPORTED IMMEDIATELY:  *FEVER GREATER THAN 100.5 F  *CHILLS WITH OR WITHOUT FEVER  NAUSEA AND VOMITING THAT IS NOT CONTROLLED WITH YOUR NAUSEA MEDICATION  *UNUSUAL SHORTNESS OF BREATH  *UNUSUAL BRUISING OR BLEEDING  TENDERNESS IN MOUTH AND THROAT WITH OR WITHOUT PRESENCE OF ULCERS  *URINARY PROBLEMS  *BOWEL PROBLEMS  UNUSUAL RASH Items with * indicate a potential emergency and should be followed up as soon as possible.  Feel free to call the clinic should you have any questions or concerns. The clinic phone number is [phone].  Please show the CHEMO ALERT CARD at check-in to the Emergency Department and triage nurse.   

## 2020-01-17 ENCOUNTER — Telehealth: Payer: Self-pay | Admitting: Hematology

## 2020-01-17 NOTE — Telephone Encounter (Signed)
Scheduled per 03/31 los, called and spoke with patient's wife. Patient will be notified of upcoming appointments.

## 2020-01-20 LAB — MULTIPLE MYELOMA PANEL, SERUM
Albumin SerPl Elph-Mcnc: 3.6 g/dL (ref 2.9–4.4)
Albumin/Glob SerPl: 1.8 — ABNORMAL HIGH (ref 0.7–1.7)
Alpha 1: 0.2 g/dL (ref 0.0–0.4)
Alpha2 Glob SerPl Elph-Mcnc: 0.6 g/dL (ref 0.4–1.0)
B-Globulin SerPl Elph-Mcnc: 0.9 g/dL (ref 0.7–1.3)
Gamma Glob SerPl Elph-Mcnc: 0.4 g/dL (ref 0.4–1.8)
Globulin, Total: 2.1 g/dL — ABNORMAL LOW (ref 2.2–3.9)
IgA: 5 mg/dL — ABNORMAL LOW (ref 61–437)
IgG (Immunoglobin G), Serum: 389 mg/dL — ABNORMAL LOW (ref 603–1613)
IgM (Immunoglobulin M), Srm: <5 mg/dL — ABNORMAL LOW (ref 20–172)
M Protein SerPl Elph-Mcnc: 0.1 g/dL — ABNORMAL HIGH
Total Protein ELP: 5.7 g/dL — ABNORMAL LOW (ref 6.0–8.5)

## 2020-01-24 DIAGNOSIS — H401131 Primary open-angle glaucoma, bilateral, mild stage: Secondary | ICD-10-CM | POA: Diagnosis not present

## 2020-01-24 NOTE — Progress Notes (Signed)
Pharmacist Chemotherapy Monitoring - Follow Up Assessment    I verify that I have reviewed each item in the below checklist:  . Regimen for the patient is scheduled for the appropriate day and plan matches scheduled date. [name] Appropriate non-routine labs are ordered dependent on drug ordered. . If applicable, additional medications reviewed and ordered per protocol based on lifetime cumulative doses and/or treatment regimen.   Plan for follow-up and/or issues identified: No . I-vent associated with next due treatment: No . MD and/or nursing notified: No  Romualdo Bolk [facility] 01/24/2020 10:21 AM

## 2020-01-27 DIAGNOSIS — M25551 Pain in right hip: Secondary | ICD-10-CM | POA: Diagnosis not present

## 2020-01-27 DIAGNOSIS — M545 Low back pain: Secondary | ICD-10-CM | POA: Diagnosis not present

## 2020-01-29 ENCOUNTER — Other Ambulatory Visit: Payer: Self-pay

## 2020-01-29 ENCOUNTER — Inpatient Hospital Stay: Payer: Medicare Other | Attending: Hematology

## 2020-01-29 ENCOUNTER — Other Ambulatory Visit: Payer: Medicare Other

## 2020-01-29 ENCOUNTER — Inpatient Hospital Stay: Payer: Medicare Other

## 2020-01-29 VITALS — BP 141/69 | HR 69 | Temp 98.7°F | Resp 18 | Wt 250.5 lb

## 2020-01-29 DIAGNOSIS — G893 Neoplasm related pain (acute) (chronic): Secondary | ICD-10-CM

## 2020-01-29 DIAGNOSIS — Z79899 Other long term (current) drug therapy: Secondary | ICD-10-CM | POA: Insufficient documentation

## 2020-01-29 DIAGNOSIS — Z7189 Other specified counseling: Secondary | ICD-10-CM

## 2020-01-29 DIAGNOSIS — C9000 Multiple myeloma not having achieved remission: Secondary | ICD-10-CM | POA: Insufficient documentation

## 2020-01-29 DIAGNOSIS — Z5112 Encounter for antineoplastic immunotherapy: Secondary | ICD-10-CM | POA: Insufficient documentation

## 2020-01-29 DIAGNOSIS — E559 Vitamin D deficiency, unspecified: Secondary | ICD-10-CM

## 2020-01-29 LAB — CBC WITH DIFFERENTIAL/PLATELET
Abs Immature Granulocytes: 0.03 10*3/uL (ref 0.00–0.07)
Basophils Absolute: 0.1 10*3/uL (ref 0.0–0.1)
Basophils Relative: 1 %
Eosinophils Absolute: 0.1 10*3/uL (ref 0.0–0.5)
Eosinophils Relative: 2 %
HCT: 38.2 % — ABNORMAL LOW (ref 39.0–52.0)
Hemoglobin: 12.5 g/dL — ABNORMAL LOW (ref 13.0–17.0)
Immature Granulocytes: 1 %
Lymphocytes Relative: 14 %
Lymphs Abs: 0.8 10*3/uL (ref 0.7–4.0)
MCH: 32.6 pg (ref 26.0–34.0)
MCHC: 32.7 g/dL (ref 30.0–36.0)
MCV: 99.7 fL (ref 80.0–100.0)
Monocytes Absolute: 0.6 10*3/uL (ref 0.1–1.0)
Monocytes Relative: 10 %
Neutro Abs: 4.2 10*3/uL (ref 1.7–7.7)
Neutrophils Relative %: 72 %
Platelets: 260 10*3/uL (ref 150–400)
RBC: 3.83 MIL/uL — ABNORMAL LOW (ref 4.22–5.81)
RDW: 13.3 % (ref 11.5–15.5)
WBC: 5.7 10*3/uL (ref 4.0–10.5)
nRBC: 0 % (ref 0.0–0.2)

## 2020-01-29 LAB — CMP (CANCER CENTER ONLY)
ALT: 22 U/L (ref 0–44)
AST: 12 U/L — ABNORMAL LOW (ref 15–41)
Albumin: 4.1 g/dL (ref 3.5–5.0)
Alkaline Phosphatase: 66 U/L (ref 38–126)
Anion gap: 10 (ref 5–15)
BUN: 12 mg/dL (ref 8–23)
CO2: 22 mmol/L (ref 22–32)
Calcium: 9.1 mg/dL (ref 8.9–10.3)
Chloride: 110 mmol/L (ref 98–111)
Creatinine: 0.89 mg/dL (ref 0.61–1.24)
GFR, Est AFR Am: >60 mL/min (ref >60)
GFR, Estimated: >60 mL/min (ref >60)
Glucose, Bld: 124 mg/dL — ABNORMAL HIGH (ref 70–99)
Potassium: 3.6 mmol/L (ref 3.5–5.1)
Sodium: 142 mmol/L (ref 135–145)
Total Bilirubin: 1 mg/dL (ref 0.3–1.2)
Total Protein: 6.4 g/dL — ABNORMAL LOW (ref 6.5–8.1)

## 2020-01-29 LAB — VITAMIN D 25 HYDROXY (VIT D DEFICIENCY, FRACTURES): Vit D, 25-Hydroxy: 60.77 ng/mL (ref 30–100)

## 2020-01-29 MED ORDER — PROCHLORPERAZINE MALEATE 10 MG PO TABS
ORAL_TABLET | ORAL | Status: AC
  Filled 2020-01-29: qty 1

## 2020-01-29 MED ORDER — ACETAMINOPHEN 325 MG PO TABS
650.0000 mg | ORAL_TABLET | Freq: Once | ORAL | Status: AC
  Administered 2020-01-29: 650 mg via ORAL

## 2020-01-29 MED ORDER — SODIUM CHLORIDE 0.9 % IV SOLN
Freq: Once | INTRAVENOUS | Status: AC
  Filled 2020-01-29: qty 250

## 2020-01-29 MED ORDER — METHYLPREDNISOLONE SODIUM SUCC 125 MG IJ SOLR
INTRAMUSCULAR | Status: AC
  Filled 2020-01-29: qty 2

## 2020-01-29 MED ORDER — DIPHENHYDRAMINE HCL 25 MG PO CAPS
ORAL_CAPSULE | ORAL | Status: AC
  Filled 2020-01-29: qty 2

## 2020-01-29 MED ORDER — PROCHLORPERAZINE MALEATE 10 MG PO TABS
10.0000 mg | ORAL_TABLET | Freq: Once | ORAL | Status: AC
  Administered 2020-01-29: 10 mg via ORAL

## 2020-01-29 MED ORDER — DIPHENHYDRAMINE HCL 25 MG PO CAPS
50.0000 mg | ORAL_CAPSULE | Freq: Once | ORAL | Status: AC
  Administered 2020-01-29: 50 mg via ORAL

## 2020-01-29 MED ORDER — METHYLPREDNISOLONE SODIUM SUCC 125 MG IJ SOLR
60.0000 mg | Freq: Once | INTRAMUSCULAR | Status: AC
  Administered 2020-01-29: 60 mg via INTRAVENOUS

## 2020-01-29 MED ORDER — DEXTROSE 5 % IV SOLN
150.0000 mg | Freq: Once | INTRAVENOUS | Status: AC
  Administered 2020-01-29: 150 mg via INTRAVENOUS
  Filled 2020-01-29: qty 60

## 2020-01-29 MED ORDER — ACETAMINOPHEN 325 MG PO TABS
ORAL_TABLET | ORAL | Status: AC
  Filled 2020-01-29: qty 2

## 2020-01-29 NOTE — Patient Instructions (Signed)
[facility] Discharge Instructions for Patients Receiving Chemotherapy  Today you received the following chemotherapy agents:  Kyprolis  To help prevent nausea and vomiting after your treatment, we encourage you to take your nausea medication as directed.   If you develop nausea and vomiting that is not controlled by your nausea medication, call the clinic.   BELOW ARE SYMPTOMS THAT SHOULD BE REPORTED IMMEDIATELY:  *FEVER GREATER THAN 100.5 F  *CHILLS WITH OR WITHOUT FEVER  NAUSEA AND VOMITING THAT IS NOT CONTROLLED WITH YOUR NAUSEA MEDICATION  *UNUSUAL SHORTNESS OF BREATH  *UNUSUAL BRUISING OR BLEEDING  TENDERNESS IN MOUTH AND THROAT WITH OR WITHOUT PRESENCE OF ULCERS  *URINARY PROBLEMS  *BOWEL PROBLEMS  UNUSUAL RASH Items with * indicate a potential emergency and should be followed up as soon as possible.  Feel free to call the clinic should you have any questions or concerns. The clinic phone number is [phone].  Please show the CHEMO ALERT CARD at check-in to the Emergency Department and triage nurse.   

## 2020-02-04 LAB — MULTIPLE MYELOMA PANEL, SERUM
Albumin SerPl Elph-Mcnc: 4.1 g/dL (ref 2.9–4.4)
Albumin/Glob SerPl: 1.9 — ABNORMAL HIGH (ref 0.7–1.7)
Alpha 1: 0.2 g/dL (ref 0.0–0.4)
Alpha2 Glob SerPl Elph-Mcnc: 0.6 g/dL (ref 0.4–1.0)
B-Globulin SerPl Elph-Mcnc: 1 g/dL (ref 0.7–1.3)
Gamma Glob SerPl Elph-Mcnc: 0.4 g/dL (ref 0.4–1.8)
Globulin, Total: 2.2 g/dL (ref 2.2–3.9)
IgA: 6 mg/dL — ABNORMAL LOW (ref 61–437)
IgG (Immunoglobin G), Serum: 407 mg/dL — ABNORMAL LOW (ref 603–1613)
IgM (Immunoglobulin M), Srm: <5 mg/dL — ABNORMAL LOW (ref 20–172)
M Protein SerPl Elph-Mcnc: 0.2 g/dL — ABNORMAL HIGH
Total Protein ELP: 6.3 g/dL (ref 6.0–8.5)

## 2020-02-05 NOTE — Progress Notes (Signed)
Pharmacist Chemotherapy Monitoring - Follow Up Assessment    I verify that I have reviewed each item in the below checklist:  . Regimen for the patient is scheduled for the appropriate day and plan matches scheduled date. [name] Appropriate non-routine labs are ordered dependent on drug ordered. . If applicable, additional medications reviewed and ordered per protocol based on lifetime cumulative doses and/or treatment regimen.   Plan for follow-up and/or issues identified: No . I-vent associated with next due treatment: No . MD and/or nursing notified: No  Michael [name] 02/05/2020 1:44 PM

## 2020-02-11 ENCOUNTER — Inpatient Hospital Stay (HOSPITAL_BASED_OUTPATIENT_CLINIC_OR_DEPARTMENT_OTHER): Payer: Medicare Other | Admitting: Hematology

## 2020-02-11 ENCOUNTER — Inpatient Hospital Stay: Payer: Medicare Other

## 2020-02-11 ENCOUNTER — Other Ambulatory Visit: Payer: Self-pay

## 2020-02-11 ENCOUNTER — Other Ambulatory Visit: Payer: Medicare Other

## 2020-02-11 VITALS — BP 157/80 | HR 70 | Temp 98.7°F | Resp 18 | Ht 73.0 in | Wt 251.6 lb

## 2020-02-11 DIAGNOSIS — Z7189 Other specified counseling: Secondary | ICD-10-CM

## 2020-02-11 DIAGNOSIS — C9002 Multiple myeloma in relapse: Secondary | ICD-10-CM

## 2020-02-11 DIAGNOSIS — Z79899 Other long term (current) drug therapy: Secondary | ICD-10-CM | POA: Diagnosis not present

## 2020-02-11 DIAGNOSIS — C9000 Multiple myeloma not having achieved remission: Secondary | ICD-10-CM

## 2020-02-11 DIAGNOSIS — E559 Vitamin D deficiency, unspecified: Secondary | ICD-10-CM | POA: Diagnosis not present

## 2020-02-11 DIAGNOSIS — Z5112 Encounter for antineoplastic immunotherapy: Secondary | ICD-10-CM

## 2020-02-11 LAB — CMP (CANCER CENTER ONLY)
ALT: 21 U/L (ref 0–44)
AST: 16 U/L (ref 15–41)
Albumin: 4.1 g/dL (ref 3.5–5.0)
Alkaline Phosphatase: 67 U/L (ref 38–126)
Anion gap: 7 (ref 5–15)
BUN: 12 mg/dL (ref 8–23)
CO2: 24 mmol/L (ref 22–32)
Calcium: 9.2 mg/dL (ref 8.9–10.3)
Chloride: 108 mmol/L (ref 98–111)
Creatinine: 0.81 mg/dL (ref 0.61–1.24)
GFR, Est AFR Am: >60 mL/min (ref >60)
GFR, Estimated: >60 mL/min (ref >60)
Glucose, Bld: 110 mg/dL — ABNORMAL HIGH (ref 70–99)
Potassium: 3.8 mmol/L (ref 3.5–5.1)
Sodium: 139 mmol/L (ref 135–145)
Total Bilirubin: 1.2 mg/dL (ref 0.3–1.2)
Total Protein: 6.3 g/dL — ABNORMAL LOW (ref 6.5–8.1)

## 2020-02-11 LAB — CBC WITH DIFFERENTIAL/PLATELET
Abs Immature Granulocytes: 0.01 10*3/uL (ref 0.00–0.07)
Basophils Absolute: 0 10*3/uL (ref 0.0–0.1)
Basophils Relative: 1 %
Eosinophils Absolute: 0.2 10*3/uL (ref 0.0–0.5)
Eosinophils Relative: 3 %
HCT: 38.9 % — ABNORMAL LOW (ref 39.0–52.0)
Hemoglobin: 13 g/dL (ref 13.0–17.0)
Immature Granulocytes: 0 %
Lymphocytes Relative: 12 %
Lymphs Abs: 0.6 10*3/uL — ABNORMAL LOW (ref 0.7–4.0)
MCH: 33 pg (ref 26.0–34.0)
MCHC: 33.4 g/dL (ref 30.0–36.0)
MCV: 98.7 fL (ref 80.0–100.0)
Monocytes Absolute: 0.4 10*3/uL (ref 0.1–1.0)
Monocytes Relative: 9 %
Neutro Abs: 3.7 10*3/uL (ref 1.7–7.7)
Neutrophils Relative %: 75 %
Platelets: 216 10*3/uL (ref 150–400)
RBC: 3.94 MIL/uL — ABNORMAL LOW (ref 4.22–5.81)
RDW: 13 % (ref 11.5–15.5)
WBC: 4.9 10*3/uL (ref 4.0–10.5)
nRBC: 0 % (ref 0.0–0.2)

## 2020-02-11 LAB — VITAMIN D 25 HYDROXY (VIT D DEFICIENCY, FRACTURES): Vit D, 25-Hydroxy: 85.83 ng/mL (ref 30–100)

## 2020-02-11 MED ORDER — PROCHLORPERAZINE MALEATE 10 MG PO TABS
10.0000 mg | ORAL_TABLET | Freq: Once | ORAL | Status: AC
  Administered 2020-02-11: 10 mg via ORAL

## 2020-02-11 MED ORDER — DIPHENHYDRAMINE HCL 25 MG PO CAPS
50.0000 mg | ORAL_CAPSULE | Freq: Once | ORAL | Status: AC
  Administered 2020-02-11: 50 mg via ORAL

## 2020-02-11 MED ORDER — METHYLPREDNISOLONE SODIUM SUCC 125 MG IJ SOLR
INTRAMUSCULAR | Status: AC
  Filled 2020-02-11: qty 2

## 2020-02-11 MED ORDER — ZOLEDRONIC ACID 4 MG/100ML IV SOLN
INTRAVENOUS | Status: AC
  Filled 2020-02-11: qty 100

## 2020-02-11 MED ORDER — ACETAMINOPHEN 325 MG PO TABS
ORAL_TABLET | ORAL | Status: AC
  Filled 2020-02-11: qty 2

## 2020-02-11 MED ORDER — ACETAMINOPHEN 325 MG PO TABS
650.0000 mg | ORAL_TABLET | Freq: Once | ORAL | Status: AC
  Administered 2020-02-11: 16:00:00 650 mg via ORAL

## 2020-02-11 MED ORDER — DEXTROSE 5 % IV SOLN
68.0000 mg/m2 | Freq: Once | INTRAVENOUS | Status: AC
  Administered 2020-02-11: 17:00:00 150 mg via INTRAVENOUS
  Filled 2020-02-11: qty 60

## 2020-02-11 MED ORDER — SODIUM CHLORIDE 0.9 % IV SOLN
Freq: Once | INTRAVENOUS | Status: AC
  Filled 2020-02-11: qty 250

## 2020-02-11 MED ORDER — ZOLEDRONIC ACID 4 MG/100ML IV SOLN
4.0000 mg | Freq: Once | INTRAVENOUS | Status: AC
  Administered 2020-02-11: 4 mg via INTRAVENOUS

## 2020-02-11 MED ORDER — DIPHENHYDRAMINE HCL 25 MG PO CAPS
ORAL_CAPSULE | ORAL | Status: AC
  Filled 2020-02-11: qty 2

## 2020-02-11 MED ORDER — METHYLPREDNISOLONE SODIUM SUCC 125 MG IJ SOLR
60.0000 mg | Freq: Once | INTRAMUSCULAR | Status: AC
  Administered 2020-02-11: 16:00:00 60 mg via INTRAVENOUS

## 2020-02-11 MED ORDER — PROCHLORPERAZINE MALEATE 10 MG PO TABS
ORAL_TABLET | ORAL | Status: AC
  Filled 2020-02-11: qty 1

## 2020-02-11 NOTE — Patient Instructions (Addendum)
[facility] Discharge Instructions for Patients Receiving Chemotherapy  Today you received the following chemotherapy agents Kyprolis  To help prevent nausea and vomiting after your treatment, we encourage you to take your nausea medication as directed.    If you develop nausea and vomiting that is not controlled by your nausea medication, call the clinic.   BELOW ARE SYMPTOMS THAT SHOULD BE REPORTED IMMEDIATELY:  *FEVER GREATER THAN 100.5 F  *CHILLS WITH OR WITHOUT FEVER  NAUSEA AND VOMITING THAT IS NOT CONTROLLED WITH YOUR NAUSEA MEDICATION  *UNUSUAL SHORTNESS OF BREATH  *UNUSUAL BRUISING OR BLEEDING  TENDERNESS IN MOUTH AND THROAT WITH OR WITHOUT PRESENCE OF ULCERS  *URINARY PROBLEMS  *BOWEL PROBLEMS  UNUSUAL RASH Items with * indicate a potential emergency and should be followed up as soon as possible.  Feel free to call the clinic should you have any questions or concerns. The clinic phone number is [phone].  Please show the CHEMO ALERT CARD at check-in to the Emergency Department and triage nurse.  Zoledronic Acid injection (Hypercalcemia, Oncology) What is this medicine? ZOLEDRONIC ACID (ZOE le dron ik AS id) lowers the amount of calcium loss from bone. It is used to treat too much calcium in your blood from cancer. It is also used to prevent complications of cancer that has spread to the bone. This medicine may be used for other purposes; ask your health care provider or pharmacist if you have questions. COMMON BRAND NAME(S): Zometa What should I tell my health care provider before I take this medicine? They need to know if you have any of these conditions:  aspirin-sensitive asthma  cancer, especially if you are receiving medicines used to treat cancer  dental disease or wear dentures  infection  kidney disease  receiving corticosteroids like dexamethasone or prednisone  an unusual or allergic reaction to zoledronic acid, other  medicines, foods, dyes, or preservatives  pregnant or trying to get pregnant  breast-feeding How should I use this medicine? This medicine is for infusion into a vein. It is given by a health care professional in a hospital or clinic setting. Talk to your pediatrician regarding the use of this medicine in children. Special care may be needed. Overdosage: If you think you have taken too much of this medicine contact a poison control center or emergency room at once. NOTE: This medicine is only for you. Do not share this medicine with others. What if I miss a dose? It is important not to miss your dose. Call your doctor or health care professional if you are unable to keep an appointment. What may interact with this medicine?  certain antibiotics given by injection  NSAIDs, medicines for pain and inflammation, like ibuprofen or naproxen  some diuretics like bumetanide, furosemide  teriparatide  thalidomide This list may not describe all possible interactions. Give your health care provider a list of all the medicines, herbs, non-prescription drugs, or dietary supplements you use. Also tell them if you smoke, drink alcohol, or use illegal drugs. Some items may interact with your medicine. What should I watch for while using this medicine? Visit your doctor or health care professional for regular checkups. It may be some time before you see the benefit from this medicine. Do not stop taking your medicine unless your doctor tells you to. Your doctor may order blood tests or other tests to see how you are doing. Women should inform their doctor if they wish to become pregnant or think they might be   There is a potential for serious side effects to an unborn child. Talk to your health care professional or pharmacist for more information. You should make sure that you get enough calcium and vitamin D while you are taking this medicine. Discuss the foods you eat and the vitamins you take  with your health care professional. Some people who take this medicine have severe bone, joint, and/or muscle pain. This medicine may also increase your risk for jaw problems or a broken thigh bone. Tell your doctor right away if you have severe pain in your jaw, bones, joints, or muscles. Tell your doctor if you have any pain that does not go away or that gets worse. Tell your dentist and dental surgeon that you are taking this medicine. You should not have major dental surgery while on this medicine. See your dentist to have a dental exam and fix any dental problems before starting this medicine. Take good care of your teeth while on this medicine. Make sure you see your dentist for regular follow-up appointments. What side effects may I notice from receiving this medicine? Side effects that you should report to your doctor or health care professional as soon as possible:  allergic reactions like skin rash, itching or hives, swelling of the face, lips, or tongue  anxiety, confusion, or depression  breathing problems  changes in vision  eye pain  feeling faint or lightheaded, falls  jaw pain, especially after dental work  mouth sores  muscle cramps, stiffness, or weakness  redness, blistering, peeling or loosening of the skin, including inside the mouth  trouble passing urine or change in the amount of urine Side effects that usually do not require medical attention (report to your doctor or health care professional if they continue or are bothersome):  bone, joint, or muscle pain  constipation  diarrhea  fever  hair loss  irritation at site where injected  loss of appetite  nausea, vomiting  stomach upset  trouble sleeping  trouble swallowing  weak or tired This list may not describe all possible side effects. Call your doctor for medical advice about side effects. You may report side effects to FDA at 1-800-FDA-1088. Where should I keep my medicine? This drug  is given in a hospital or clinic and will not be stored at home. NOTE: This sheet is a summary. It may not cover all possible information. If you have questions about this medicine, talk to your doctor, pharmacist, or health care provider.  2020 Elsevier/Gold Standard (2014-03-01 14:19:39)  

## 2020-02-11 NOTE — Progress Notes (Signed)
HEMATOLOGY/ONCOLOGY CLINIC NOTE  Date of Service: 02/11/20     Patient Care Team: [name], MD as PCP - General (Family Medicine) [name], MD as Consulting Physician (Hematology) [name], MD as Attending Physician (Radiation Oncology) [name], RN (Inactive) as Oncology Nurse Navigator (Oncology) [name] MD ([facility] _ primary oncology) phone number [phone] Email- [email or web address]  CHIEF COMPLAINTS  followup of multiple myeloma.  HISTORY OF PRESENTING ILLNESS:   Michael [name] is a wonderful 69 y.o. 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 Patient is currently on a study protocol and is being monitored for myeloma recurrence.  Based on available records being put together  -Patient was diagnosed with IgG kappa ISS stage I multiple myeloma with hyperdiploid complex cytogenetics in 2012. Bone marrow biopsy apparently showed 50% kappa restricted plasma cells with an initial M spike of 3.4 g/dL with evidence of lytic lesions on his PET/CT scan including right rib fracture and lesions of the lumbar and cervical spine. -Patient was treated under protocol 11-C-0221 with from 02/21/2013 with  Carfilzomib/Revlimid/Dexamethasone for a total of 8 cycles. He reports that his stem cells were collected and stored after 4 cycles -Posttreatment bone marrow biopsy showed less than 5% plasma cells with a negative flow cytometry and improvement in his previously PET avid lesions. Some concern for new focus of activity at C2 lytic lesion at L4 and several small lesions throughout the vertebrae. -Patient was on maintenance lenalidomide 10  mg by mouth daily for 2 years or more until end of 2014. 08/19/2013 - bone marrow biopsy showed normocellular marrow with less than 5% plasma cells. PET CT scan showed decreased focal metabolic ligament prior rib lesions and no new lesions. Flow cytometry was negative for residual disease. Serum and urine electrophoresis and IFE showed no evidence of monoclonal protein. -Patient notes that he was off protocol for a few years due to lack of clinical trial research funding. -Patient notes that he is back on a follow-up protocol and continues to follow and receive his primary treatment at [facility] at this time. -Patient reports he had his last PET CT scan blood and urine tests and bone marrow examination in February 2018. The results of which are not available to [place] currently.  He notes that there was concern for a very slowly growing sternal FDG avid lesion that has been present for years. He has a follow-up at [facility] next month to determine the management of this lesion. He reports that was some consideration of considering radiation. he notes that this lesion has not been biopsied. No other focal bone pains at this time.  We discussed in detail about what our role will be and noted that we shall be available for any acute issues that need to be taken care of locally but that at this time the primary treatment and evaluation will be driven by his team at [facility] as per their research protocols and input. The patient and his wife are clearly aware of this and are in agreement with this plan.  INTERVAL HISTORY  Michael [name] is here for continued management of his Multiple Myeloma. He is here for C17D15 of maintenance Carfilzomib. The patient's last  visit with [place] was on 01/15/2020. The pt reports that he is doing well overall.  The pt reports that he is still having some right hip discomfort, but has otherwise been well. He has been eating better and has no new concerns. Pt has no constitutional  symptoms or new lumps/bumps.   Lab results today (02/11/20) of CBC w/diff and CMP is as follows: all values are WNL except for RBC at 3.94, HCT at 38.9, Lymphs Abs at 0.6K, Glucose at 110, Total Protein at 6.3.  02/11/2020 Vitamin D 25 hydroxy at 85.83 02/11/2020 MMP is in progress  On review of systems, pt reports right hip pain and denies low appetite, fevers, chills, night sweats, new lumps/bumps and any other symptoms.   MEDICAL HISTORY:  Past Medical History:  Diagnosis Date  . Angioedema   . CPAP (continuous positive airway pressure) dependence    uses at night  . Gastroesophageal reflux disease without esophagitis   . History of radiation therapy 06/04/18- 06/15/18   25 Gy in 10 fractions to the laryngeal/ cartilage lesion  . Hyperlipidemia   . Multiple myeloma ([facility]) 2012   Treated at [facility]  . Non morbid obesity due to excess calories   . Prediabetes   . Prediabetes   . Unspecified glaucoma(365.9)   . Urticaria, unspecified   Diabetes mellitus Glaucoma Left toes neuropathy  GERD  SURGICAL HISTORY: Past Surgical History:  Procedure Laterality Date  . IR RADIOLOGIST EVAL & MGMT  11/13/2018  . PORTACATH PLACEMENT    . PORTACATH PLACEMENT     removed 02/2016    SOCIAL HISTORY: Social History   Socioeconomic History  . Marital status: Married    Spouse name: Not on file  . Number of children: 2  . Years of education: Not on file  . Highest education level: Not on file  Occupational History  . Occupation: Retired Chief Strategy Officer for [employer]  . Smoking status: Never Smoker  . Smokeless tobacco: Never Used  Substance and Sexual Activity  . Alcohol use: Yes    Alcohol/week: 0.0 standard drinks    Comment: Occasional beer  . Drug use: No  . Sexual activity: Not on file  Other Topics Concern  . Not on file  Social History Narrative   Unable to [employer] partner violence- partner in room    Social Determinants of Health   Financial Resource  Strain:   . Difficulty of Paying Living Expenses:   Food Insecurity:   . Worried About Charity fundraiser in the Last Year:   . Arboriculturist in the Last Year:   Transportation Needs:   . Film/video editor (Medical):   [name] Lack of Transportation (Non-Medical):   Physical Activity:   . Days of Exercise per Week:   . Minutes of Exercise per Session:   Stress:   . Feeling of Stress :   Social Connections:   . Frequency of Communication with Friends and Family:   . Frequency of Social Gatherings with Friends and Family:   . Attends Religious Services:   . Active Member of Clubs or Organizations:   . Attends Archivist Meetings:   [name] Marital Status:   Intimate Partner Violence:   . Fear of Current or Ex-Partner:   . Emotionally Abused:   [name] Physically Abused:   . Sexually Abused:   Never smoker Married Retired from the [employer] in June 2017 and moved to [facility].  FAMILY  HISTORY: Family History  Problem Relation Age of Onset  . Cancer Father     ALLERGIES:  has No Known Allergies.  MEDICATIONS:  Current Outpatient Medications  Medication Sig Dispense Refill  . acyclovir (ZOVIRAX) 400 MG tablet Take 1 tablet (400 mg total) by mouth 2 (two) times daily. 60 tablet 0  . aspirin 81 MG tablet Take 81 mg by mouth daily.    [name] atorvastatin (LIPITOR) 20 MG tablet Take 20 mg by mouth every evening.     . Calcium Citrate-Vitamin D (CALCIUM CITRATE + D3 MAXIMUM PO) Take 630 mg by mouth.     . dexamethasone (DECADRON) 4 MG tablet 65m (5 tabs) with breakfast the day after each daratumumab treatment 20 tablet 4  . ergocalciferol (VITAMIN D2) 1.25 MG (50000 UT) capsule Take 1 capsule (50,000 Units total) by mouth 2 (two) times a week. 24 capsule 3  . glucose blood (FREESTYLE LITE) test strip     . Multiple Vitamin (MULTIVITAMIN) tablet Take 1 tablet by mouth daily.    . ondansetron (ZOFRAN) 8 MG tablet Take 1 tablet (8 mg total) by mouth 2 (two) times  daily as needed (Nausea or vomiting). 30 tablet 1  . oxyCODONE (OXY IR/ROXICODONE) 5 MG immediate release tablet Take 1-2 tablets (5-10 mg total) by mouth every 4 (four) hours as needed for severe pain or breakthrough pain. (Patient not taking: Reported on 01/15/2020) 60 tablet 0  . pantoprazole (PROTONIX) 40 MG tablet Take 40 mg by mouth daily.     . prochlorperazine (COMPAZINE) 10 MG tablet Take 1 tablet (10 mg total) by mouth every 6 (six) hours as needed (Nausea or vomiting). 30 tablet 1  . senna-docusate (SENOKOT-S) 8.6-50 MG tablet Take 2 tablets by mouth at bedtime. Takes 50 mg (Patient not taking: Reported on 12/17/2019) 60 tablet 3   No current facility-administered medications for this visit.   Facility-Administered Medications Ordered in Other Visits  Medication Dose Route Frequency Provider Last Rate Last Admin  . carfilzomib (KYPROLIS) 150 mg in dextrose 5 % 100 mL chemo infusion  68 mg/m2 (Order-Specific) Intravenous Once KBrunetta Genera MD 350 mL/hr at 02/11/20 1639 150 mg at 02/11/20 1639    REVIEW OF SYSTEMS:   A 10+ POINT REVIEW OF SYSTEMS WAS OBTAINED including neurology, dermatology, psychiatry, cardiac, respiratory, lymph, extremities, GI, GU, Musculoskeletal, constitutional, breasts, reproductive, HEENT.  All pertinent positives are noted in the HPI.  All others are negative.   PHYSICAL EXAMINATION: ECOG PERFORMANCE STATUS: 1 - Symptomatic but completely ambulatory  Vitals:   02/11/20 1451  BP: (!) 157/80  Pulse: 70  Resp: 18  Temp: 98.7 F (37.1 C)  TempSrc: Temporal  SpO2: 99%  Weight: 251 lb 9.6 oz (114.1 kg)  Height: 6' 1"  (1.854 m)   Body mass index is 33.19 kg/m.   Exam was given in a chair   GENERAL:alert, in no acute distress and comfortable SKIN: no acute rashes, no significant lesions EYES: conjunctiva are pink and non-injected, sclera anicteric OROPHARYNX: MMM, no exudates, no oropharyngeal erythema or ulceration NECK: supple, no JVD LYMPH:   no palpable lymphadenopathy in the cervical, axillary or inguinal regions LUNGS: clear to auscultation b/l with normal respiratory effort HEART: regular rate & rhythm ABDOMEN:  normoactive bowel sounds , non tender, not distended. No palpable hepatosplenomegaly.  Extremity: no pedal edema PSYCH: alert & oriented x 3 with fluent speech NEURO: no focal motor/sensory deficits  LABORATORY DATA:  I have reviewed the data as listed . CBC  Latest Ref Rng & Units 02/11/2020 01/29/2020 01/15/2020  WBC 4.0 - 10.5 K/uL 4.9 5.7 3.9(L)  Hemoglobin 13.0 - 17.0 g/dL 13.0 12.5(L) 12.5(L)  Hematocrit 39.0 - 52.0 % 38.9(L) 38.2(L) 37.1(L)  Platelets 150 - 400 K/uL 216 260 223    . CMP Latest Ref Rng & Units 02/11/2020 01/29/2020 01/15/2020  Glucose 70 - 99 mg/dL 110(H) 124(H) 118(H)  BUN 8 - 23 mg/dL 12 12 9   Creatinine 0.61 - 1.24 mg/dL 0.81 0.89 0.86  Sodium 135 - 145 mmol/L 139 142 140  Potassium 3.5 - 5.1 mmol/L 3.8 3.6 3.8  Chloride 98 - 111 mmol/L 108 110 106  CO2 22 - 32 mmol/L 24 22 25   Calcium 8.9 - 10.3 mg/dL 9.2 9.1 9.0  Total Protein 6.5 - 8.1 g/dL 6.3(L) 6.4(L) 6.0(L)  Total Bilirubin 0.3 - 1.2 mg/dL 1.2 1.0 1.2  Alkaline Phos 38 - 126 U/L 67 66 71  AST 15 - 41 U/L 16 12(L) 15  ALT 0 - 44 U/L 21 22 19    08/16/2019 BM Bx Report ([id number]):   08/16/2019 FISH Panel:      05/08/18 Left Neck Thyroid Cartilage Biopsy:        RADIOGRAPHIC STUDIES: I have personally reviewed the radiological images as listed and agreed with the findings in the report. No results found.  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 Bone marrow biopsy apparently showed 50% kappa restricted plasma cells with an initial M spike of 3.4 g/dL with evidence of lytic lesions on his PET/CT scan including right rib fracture and lesions of the  lumbar and cervical spine. -Patient was treated under protocol 11-C-0221 with from 02/21/2013 with  Carfilzomib/Revlimid/Dexamethasone for a total of 8 cycles. He reports that his stem cells were collected and stored after 4 cycles -Posttreatment bone marrow biopsy showed less than 5% plasma cells with a negative flow cytometry and improvement in his previously PET avid lesions. Some concern for new focus of activity at C2 lytic lesion at L4 and several small lesions throughout the vertebrae. -Patient was on maintenance lenalidomide 10 mg by mouth daily for 2 years or more until end of 2014. 08/19/2013 - bone marrow biopsy showed normocellular marrow with less than 5% plasma cells. PET CT scan showed decreased focal metabolic ligament prior rib lesions and no new lesions. Flow cytometry was negative for residual disease. Serum and urine electrophoresis and IFE showed no evidence of monoclonal protein. -Patient notes that he was off protocol for a few years due to lack of clinical trial research funding. -Patient returned to follow-up protocol and began to follow and receive his primary treatment at [facility]  -Patient's labs show minimal IgG kappa protein on IFE and a CT chest in May 2018 that did not show any overt signs of myeloma progression in the bones.  Rpt Myeloma labs 03/2017 - M spike of 0.3g/dl with a repeat M spike stable at 0.3 g/dL on 05/23/2017 which remains stable on labs today 07/25/17 Serum kappa lambda free light chain ratio not significantly changed. PET/CT scan showed a single metabolically active sternal lesion without any overt bony destruction. Bone marrow biopsy done on 06/06/2017 - shows no overt involvement with multiple myeloma.  04/24/18 CT Soft Tissue Neck revealed Left laryngeal 3 cm soft tissue mass appears to have originated within and is expanding the  left thyroid cartilage. This is new since the August 2018 PET-CT, and there is absent lymphadenopathy. Consider Multiple Myeloma  of the laryngeal cartilage in this clinical setting. Primary cartilaginous neoplasm, squamous cell carcinoma, and other differential considerations are less likely. Superimposed widespread multiple myeloma lesions in the visible skeleton.  05/08/18 Left neck thyroid cartilage biopsy revealed Plasma cell neoplasm.  05/22/18 PET/CT revealed Soft tissue mass centered around the destroyed left thyroid cartilage is hypermetabolic and consistent with known plasmacytoma. No adenopathy in the neck. 2. Hypermetabolic 3 cm cutaneous and subcutaneous soft tissue mass involving the posterior upper thorax suspicious for cutaneous manifestation of myeloma (plasmacytoma). 3. Persistent and slightly progressive hypermetabolic sternal lesion. 4. Diffuse lytic myelomatous lesions throughout the bony structures but no other hypermetabolic foci. 5. Focus of hypermetabolism in the left aspect of the lower prostate gland, not present on the prior study. This could be an area of infection/inflammation or potential prostate cancer. Recommend correlation with physical examination and PSA level.    Completed RT between 06/04/18 and 06/15/18 of 25 Gy by 10 fractions  The pt pursued a clinical trial at the [facility] with RT and immunotherapy in October 2019 through October 18, 2018 with further information available at [email or web address]  10/18/18 M Protein increased to 3.5g, from 0.7g at our last visit on 07/10/18. 10/02/18 PET/CT from [facility] indicated new hypermetabolism in left clavicle, left scapula, bilateral ribs, spine, and left proximal femur 10/18/18 MRI from [facility] ndicated a pathologic fracture at T12  10/2018 M Protein at 4.1g  11/23/18 NM Bone revealed Patchy uptake throughout the ribs and spine with areas of focally increased activity in both T12 pedicles and the right L1 pedicle. This focal activity could be stress related without clear corresponding fracture on available prior studies.  08/16/2019 FISH Panel  which revealed "No mutations detected."  08/16/2019 [facility] Pathology [phone]) which revealed  "DIAGNOSIS: BONE MARROW, ASPIRATE, CLOT, CORE: -Hypercellular bone marrow for age with trilineage hematopoiesis and less than 1% plasma cells PERIPHERAL BLOOD: -Macrocytic anemia -Leukocytosis ".  09/03/2019 PET/CT scan ([id number]) revealed  "1. No evidence of active multiple myeloma on whole-body FDG PET scan. 2. Stable lytic lesions within the spine and sternum consistent treated metastasis. 3. No soft tissue plasmacytoma. 4. New compression fracture at T12 compared to PET-CT scan 10/02/2018."  2) h/o Elevated bilirubin level . ? [place]  3) PET/CT abnormality in the Prostate. PSA levels WNL -Primary care physician to consider urology referral  4) Abnormal focus of FDG avid at a spot in the liver - will need to be monitor on f/u scans. No pain or other symptoms at this time.  PLAN:  -Discussed pt labwork today, 02/11/20; blood counts and chemistries are stable  -Discussed 02/11/2020 Vitamin D 25 hydroxy is well-replaced at 85.83 -Discussed 02/11/2020 MMP is in progress, 01/29/2020 M Protein is stable at 0.2 g/dL -The pt has no prohibitive toxicities from continuing C17D15 of Carfilzomib at this time.  -Due to lab stability will space follow-ups out to every 2 months  -Discussed again a referral to transplant center now that the pandemic is beginning to settle down. Pt will discuss with wife and make a decision.  -Will see back in 8 weeks with labs    FOLLOW UP: Plz schedule next 3 cycles (6 doses- q2weeks) of maintenance Carfilzomib with labs  MD visit in 8 weeks (patient prefers morning appointment)   The total time spent in the appt was 20 minutes and more than 50% was on counseling  and direct patient cares.  All of the patient's questions were answered with apparent satisfaction. The patient knows to call the clinic with any problems, questions or concerns.    Sullivan Lone MD [place] AAHIVMS [facility] Hematology/Oncology Physician [facility]  (Office):       [phone] (Work cell):  [phone] (Fax):           [phone]   I, Yevette Edwards, am acting as a scribe for Dr. Sullivan Lone.   .I have reviewed the above documentation for accuracy and completeness, and I agree with the above. [name] MD

## 2020-02-13 ENCOUNTER — Telehealth: Payer: Self-pay | Admitting: Hematology

## 2020-02-13 DIAGNOSIS — L720 Epidermal cyst: Secondary | ICD-10-CM | POA: Diagnosis not present

## 2020-02-13 DIAGNOSIS — L02212 Cutaneous abscess of back [any part, except buttock]: Secondary | ICD-10-CM | POA: Diagnosis not present

## 2020-02-13 NOTE — Telephone Encounter (Signed)
Scheduled per 04/27 los, patient has been called and voicemail was left. ?

## 2020-02-14 LAB — MULTIPLE MYELOMA PANEL, SERUM
Albumin SerPl Elph-Mcnc: 4 g/dL (ref 2.9–4.4)
Albumin/Glob SerPl: 1.9 — ABNORMAL HIGH (ref 0.7–1.7)
Alpha 1: 0.2 g/dL (ref 0.0–0.4)
Alpha2 Glob SerPl Elph-Mcnc: 0.6 g/dL (ref 0.4–1.0)
B-Globulin SerPl Elph-Mcnc: 1 g/dL (ref 0.7–1.3)
Gamma Glob SerPl Elph-Mcnc: 0.4 g/dL (ref 0.4–1.8)
Globulin, Total: 2.2 g/dL (ref 2.2–3.9)
IgA: <5 mg/dL — ABNORMAL LOW (ref 61–437)
IgG (Immunoglobin G), Serum: 431 mg/dL — ABNORMAL LOW (ref 603–1613)
IgM (Immunoglobulin M), Srm: 7 mg/dL — ABNORMAL LOW (ref 20–172)
M Protein SerPl Elph-Mcnc: 0.3 g/dL — ABNORMAL HIGH
Total Protein ELP: 6.2 g/dL (ref 6.0–8.5)

## 2020-02-15 ENCOUNTER — Other Ambulatory Visit: Payer: Self-pay | Admitting: Hematology

## 2020-02-15 DIAGNOSIS — C9000 Multiple myeloma not having achieved remission: Secondary | ICD-10-CM

## 2020-02-15 DIAGNOSIS — Z7189 Other specified counseling: Secondary | ICD-10-CM

## 2020-02-17 DIAGNOSIS — C9000 Multiple myeloma not having achieved remission: Secondary | ICD-10-CM | POA: Diagnosis not present

## 2020-02-17 DIAGNOSIS — R229 Localized swelling, mass and lump, unspecified: Secondary | ICD-10-CM | POA: Diagnosis not present

## 2020-02-17 MED ORDER — ACYCLOVIR 400 MG PO TABS: 400.0000 mg | ORAL_TABLET | Freq: Two times a day (BID) | ORAL | 0 refills | Status: DC

## 2020-02-17 NOTE — Telephone Encounter (Signed)
Patient request refill

## 2020-02-20 NOTE — Progress Notes (Signed)
Pharmacist Chemotherapy Monitoring - Follow Up Assessment    I verify that I have reviewed each item in the below checklist:  . Regimen for the patient is scheduled for the appropriate day and plan matches scheduled date. [name] Appropriate non-routine labs are ordered dependent on drug ordered. . If applicable, additional medications reviewed and ordered per protocol based on lifetime cumulative doses and/or treatment regimen.   Plan for follow-up and/or issues identified: No . I-vent associated with next due treatment: No . MD and/or nursing notified: No  Britt Boozer 02/20/2020 10:19 AM

## 2020-02-26 ENCOUNTER — Other Ambulatory Visit: Payer: Self-pay

## 2020-02-26 ENCOUNTER — Inpatient Hospital Stay: Payer: Medicare Other

## 2020-02-26 ENCOUNTER — Inpatient Hospital Stay: Payer: Medicare Other | Attending: Hematology

## 2020-02-26 ENCOUNTER — Inpatient Hospital Stay: Payer: Medicare Other | Admitting: Hematology

## 2020-02-26 DIAGNOSIS — C9000 Multiple myeloma not having achieved remission: Secondary | ICD-10-CM | POA: Diagnosis not present

## 2020-02-26 DIAGNOSIS — Z5112 Encounter for antineoplastic immunotherapy: Secondary | ICD-10-CM | POA: Insufficient documentation

## 2020-02-26 DIAGNOSIS — E559 Vitamin D deficiency, unspecified: Secondary | ICD-10-CM

## 2020-02-26 DIAGNOSIS — Z7189 Other specified counseling: Secondary | ICD-10-CM

## 2020-02-26 LAB — CBC WITH DIFFERENTIAL/PLATELET
Abs Immature Granulocytes: 0.04 10*3/uL (ref 0.00–0.07)
Basophils Absolute: 0.1 10*3/uL (ref 0.0–0.1)
Basophils Relative: 1 %
Eosinophils Absolute: 0.2 10*3/uL (ref 0.0–0.5)
Eosinophils Relative: 4 %
HCT: 37.9 % — ABNORMAL LOW (ref 39.0–52.0)
Hemoglobin: 12.6 g/dL — ABNORMAL LOW (ref 13.0–17.0)
Immature Granulocytes: 1 %
Lymphocytes Relative: 12 %
Lymphs Abs: 0.6 10*3/uL — ABNORMAL LOW (ref 0.7–4.0)
MCH: 33.3 pg (ref 26.0–34.0)
MCHC: 33.2 g/dL (ref 30.0–36.0)
MCV: 100.3 fL — ABNORMAL HIGH (ref 80.0–100.0)
Monocytes Absolute: 0.6 10*3/uL (ref 0.1–1.0)
Monocytes Relative: 12 %
Neutro Abs: 3.2 10*3/uL (ref 1.7–7.7)
Neutrophils Relative %: 70 %
Platelets: 290 10*3/uL (ref 150–400)
RBC: 3.78 MIL/uL — ABNORMAL LOW (ref 4.22–5.81)
RDW: 12.9 % (ref 11.5–15.5)
WBC: 4.6 10*3/uL (ref 4.0–10.5)
nRBC: 0 % (ref 0.0–0.2)

## 2020-02-26 LAB — CMP (CANCER CENTER ONLY)
ALT: 16 U/L (ref 0–44)
AST: 13 U/L — ABNORMAL LOW (ref 15–41)
Albumin: 3.8 g/dL (ref 3.5–5.0)
Alkaline Phosphatase: 67 U/L (ref 38–126)
Anion gap: 11 (ref 5–15)
BUN: 12 mg/dL (ref 8–23)
CO2: 24 mmol/L (ref 22–32)
Calcium: 9.3 mg/dL (ref 8.9–10.3)
Chloride: 109 mmol/L (ref 98–111)
Creatinine: 0.81 mg/dL (ref 0.61–1.24)
GFR, Est AFR Am: >60 mL/min (ref >60)
GFR, Estimated: >60 mL/min (ref >60)
Glucose, Bld: 104 mg/dL — ABNORMAL HIGH (ref 70–99)
Potassium: 3.9 mmol/L (ref 3.5–5.1)
Sodium: 144 mmol/L (ref 135–145)
Total Bilirubin: 0.8 mg/dL (ref 0.3–1.2)
Total Protein: 6.3 g/dL — ABNORMAL LOW (ref 6.5–8.1)

## 2020-02-26 LAB — VITAMIN D 25 HYDROXY (VIT D DEFICIENCY, FRACTURES): Vit D, 25-Hydroxy: 69.42 ng/mL (ref 30–100)

## 2020-02-26 MED ORDER — DEXTROSE 5 % IV SOLN
68.0000 mg/m2 | Freq: Once | INTRAVENOUS | Status: AC
  Administered 2020-02-26: 11:00:00 150 mg via INTRAVENOUS
  Filled 2020-02-26: qty 60

## 2020-02-26 MED ORDER — SODIUM CHLORIDE 0.9 % IV SOLN
Freq: Once | INTRAVENOUS | Status: AC
  Filled 2020-02-26: qty 250

## 2020-02-26 MED ORDER — PROCHLORPERAZINE MALEATE 10 MG PO TABS
10.0000 mg | ORAL_TABLET | Freq: Once | ORAL | Status: AC
  Administered 2020-02-26: 10 mg via ORAL

## 2020-02-26 MED ORDER — METHYLPREDNISOLONE SODIUM SUCC 125 MG IJ SOLR
60.0000 mg | Freq: Once | INTRAMUSCULAR | Status: AC
  Administered 2020-02-26: 60 mg via INTRAVENOUS

## 2020-02-26 MED ORDER — ACETAMINOPHEN 325 MG PO TABS
650.0000 mg | ORAL_TABLET | Freq: Once | ORAL | Status: AC
  Administered 2020-02-26: 650 mg via ORAL

## 2020-02-26 MED ORDER — DIPHENHYDRAMINE HCL 25 MG PO CAPS
ORAL_CAPSULE | ORAL | Status: AC
  Filled 2020-02-26: qty 2

## 2020-02-26 MED ORDER — DIPHENHYDRAMINE HCL 25 MG PO CAPS
50.0000 mg | ORAL_CAPSULE | Freq: Once | ORAL | Status: AC
  Administered 2020-02-26: 50 mg via ORAL

## 2020-02-26 MED ORDER — ACETAMINOPHEN 325 MG PO TABS
ORAL_TABLET | ORAL | Status: AC
  Filled 2020-02-26: qty 2

## 2020-02-26 MED ORDER — METHYLPREDNISOLONE SODIUM SUCC 125 MG IJ SOLR
INTRAMUSCULAR | Status: AC
  Filled 2020-02-26: qty 2

## 2020-02-26 MED ORDER — PROCHLORPERAZINE MALEATE 10 MG PO TABS
ORAL_TABLET | ORAL | Status: AC
  Filled 2020-02-26: qty 1

## 2020-02-26 NOTE — Patient Instructions (Signed)
[facility] Discharge Instructions for Patients Receiving Chemotherapy  Today you received the following chemotherapy agents Kyprolis  To help prevent nausea and vomiting after your treatment, we encourage you to take your nausea medication as directed.    If you develop nausea and vomiting that is not controlled by your nausea medication, call the clinic.   BELOW ARE SYMPTOMS THAT SHOULD BE REPORTED IMMEDIATELY:  *FEVER GREATER THAN 100.5 F  *CHILLS WITH OR WITHOUT FEVER  NAUSEA AND VOMITING THAT IS NOT CONTROLLED WITH YOUR NAUSEA MEDICATION  *UNUSUAL SHORTNESS OF BREATH  *UNUSUAL BRUISING OR BLEEDING  TENDERNESS IN MOUTH AND THROAT WITH OR WITHOUT PRESENCE OF ULCERS  *URINARY PROBLEMS  *BOWEL PROBLEMS  UNUSUAL RASH Items with * indicate a potential emergency and should be followed up as soon as possible.  Feel free to call the clinic should you have any questions or concerns. The clinic phone number is [phone].  Please show the CHEMO ALERT CARD at check-in to the Emergency Department and triage nurse.  Zoledronic Acid injection (Hypercalcemia, Oncology) What is this medicine? ZOLEDRONIC ACID (ZOE le dron ik AS id) lowers the amount of calcium loss from bone. It is used to treat too much calcium in your blood from cancer. It is also used to prevent complications of cancer that has spread to the bone. This medicine may be used for other purposes; ask your health care provider or pharmacist if you have questions. COMMON BRAND NAME(S): Zometa What should I tell my health care provider before I take this medicine? They need to know if you have any of these conditions:  aspirin-sensitive asthma  cancer, especially if you are receiving medicines used to treat cancer  dental disease or wear dentures  infection  kidney disease  receiving corticosteroids like dexamethasone or prednisone  an unusual or allergic reaction to zoledronic acid, other  medicines, foods, dyes, or preservatives  pregnant or trying to get pregnant  breast-feeding How should I use this medicine? This medicine is for infusion into a vein. It is given by a health care professional in a hospital or clinic setting. Talk to your pediatrician regarding the use of this medicine in children. Special care may be needed. Overdosage: If you think you have taken too much of this medicine contact a poison control center or emergency room at once. NOTE: This medicine is only for you. Do not share this medicine with others. What if I miss a dose? It is important not to miss your dose. Call your doctor or health care professional if you are unable to keep an appointment. What may interact with this medicine?  certain antibiotics given by injection  NSAIDs, medicines for pain and inflammation, like ibuprofen or naproxen  some diuretics like bumetanide, furosemide  teriparatide  thalidomide This list may not describe all possible interactions. Give your health care provider a list of all the medicines, herbs, non-prescription drugs, or dietary supplements you use. Also tell them if you smoke, drink alcohol, or use illegal drugs. Some items may interact with your medicine. What should I watch for while using this medicine? Visit your doctor or health care professional for regular checkups. It may be some time before you see the benefit from this medicine. Do not stop taking your medicine unless your doctor tells you to. Your doctor may order blood tests or other tests to see how you are doing. Women should inform their doctor if they wish to become pregnant or think they might be   There is a potential for serious side effects to an unborn child. Talk to your health care professional or pharmacist for more information. You should make sure that you get enough calcium and vitamin D while you are taking this medicine. Discuss the foods you eat and the vitamins you take  with your health care professional. Some people who take this medicine have severe bone, joint, and/or muscle pain. This medicine may also increase your risk for jaw problems or a broken thigh bone. Tell your doctor right away if you have severe pain in your jaw, bones, joints, or muscles. Tell your doctor if you have any pain that does not go away or that gets worse. Tell your dentist and dental surgeon that you are taking this medicine. You should not have major dental surgery while on this medicine. See your dentist to have a dental exam and fix any dental problems before starting this medicine. Take good care of your teeth while on this medicine. Make sure you see your dentist for regular follow-up appointments. What side effects may I notice from receiving this medicine? Side effects that you should report to your doctor or health care professional as soon as possible:  allergic reactions like skin rash, itching or hives, swelling of the face, lips, or tongue  anxiety, confusion, or depression  breathing problems  changes in vision  eye pain  feeling faint or lightheaded, falls  jaw pain, especially after dental work  mouth sores  muscle cramps, stiffness, or weakness  redness, blistering, peeling or loosening of the skin, including inside the mouth  trouble passing urine or change in the amount of urine Side effects that usually do not require medical attention (report to your doctor or health care professional if they continue or are bothersome):  bone, joint, or muscle pain  constipation  diarrhea  fever  hair loss  irritation at site where injected  loss of appetite  nausea, vomiting  stomach upset  trouble sleeping  trouble swallowing  weak or tired This list may not describe all possible side effects. Call your doctor for medical advice about side effects. You may report side effects to FDA at 1-800-FDA-1088. Where should I keep my medicine? This drug  is given in a hospital or clinic and will not be stored at home. NOTE: This sheet is a summary. It may not cover all possible information. If you have questions about this medicine, talk to your doctor, pharmacist, or health care provider.  2020 [employer] Standard (2014-03-01 14:19:39)  

## 2020-03-03 NOTE — Progress Notes (Signed)
This encounter was created in error - please disregard.

## 2020-03-11 ENCOUNTER — Inpatient Hospital Stay: Payer: Medicare Other

## 2020-03-11 ENCOUNTER — Other Ambulatory Visit: Payer: Self-pay

## 2020-03-11 VITALS — BP 150/83 | HR 68 | Temp 98.1°F | Resp 18 | Wt 251.5 lb

## 2020-03-11 DIAGNOSIS — C9000 Multiple myeloma not having achieved remission: Secondary | ICD-10-CM

## 2020-03-11 DIAGNOSIS — Z7189 Other specified counseling: Secondary | ICD-10-CM

## 2020-03-11 DIAGNOSIS — E559 Vitamin D deficiency, unspecified: Secondary | ICD-10-CM

## 2020-03-11 DIAGNOSIS — Z5112 Encounter for antineoplastic immunotherapy: Secondary | ICD-10-CM

## 2020-03-11 LAB — CMP (CANCER CENTER ONLY)
ALT: 17 U/L (ref 0–44)
AST: 12 U/L — ABNORMAL LOW (ref 15–41)
Albumin: 3.8 g/dL (ref 3.5–5.0)
Alkaline Phosphatase: 70 U/L (ref 38–126)
Anion gap: 7 (ref 5–15)
BUN: 10 mg/dL (ref 8–23)
CO2: 25 mmol/L (ref 22–32)
Calcium: 9 mg/dL (ref 8.9–10.3)
Chloride: 108 mmol/L (ref 98–111)
Creatinine: 0.79 mg/dL (ref 0.61–1.24)
GFR, Est AFR Am: >60 mL/min (ref >60)
GFR, Estimated: >60 mL/min (ref >60)
Glucose, Bld: 153 mg/dL — ABNORMAL HIGH (ref 70–99)
Potassium: 3.8 mmol/L (ref 3.5–5.1)
Sodium: 140 mmol/L (ref 135–145)
Total Bilirubin: 0.9 mg/dL (ref 0.3–1.2)
Total Protein: 6.2 g/dL — ABNORMAL LOW (ref 6.5–8.1)

## 2020-03-11 LAB — CBC WITH DIFFERENTIAL/PLATELET
Abs Immature Granulocytes: 0.03 10*3/uL (ref 0.00–0.07)
Basophils Absolute: 0 10*3/uL (ref 0.0–0.1)
Basophils Relative: 1 %
Eosinophils Absolute: 0.1 10*3/uL (ref 0.0–0.5)
Eosinophils Relative: 2 %
HCT: 36.9 % — ABNORMAL LOW (ref 39.0–52.0)
Hemoglobin: 12.3 g/dL — ABNORMAL LOW (ref 13.0–17.0)
Immature Granulocytes: 1 %
Lymphocytes Relative: 14 %
Lymphs Abs: 0.7 10*3/uL (ref 0.7–4.0)
MCH: 32.9 pg (ref 26.0–34.0)
MCHC: 33.3 g/dL (ref 30.0–36.0)
MCV: 98.7 fL (ref 80.0–100.0)
Monocytes Absolute: 0.4 10*3/uL (ref 0.1–1.0)
Monocytes Relative: 9 %
Neutro Abs: 3.6 10*3/uL (ref 1.7–7.7)
Neutrophils Relative %: 73 %
Platelets: 207 10*3/uL (ref 150–400)
RBC: 3.74 MIL/uL — ABNORMAL LOW (ref 4.22–5.81)
RDW: 12.7 % (ref 11.5–15.5)
WBC: 4.9 10*3/uL (ref 4.0–10.5)
nRBC: 0 % (ref 0.0–0.2)

## 2020-03-11 LAB — VITAMIN D 25 HYDROXY (VIT D DEFICIENCY, FRACTURES): Vit D, 25-Hydroxy: 71.85 ng/mL (ref 30–100)

## 2020-03-11 MED ORDER — DEXTROSE 5 % IV SOLN
68.0000 mg/m2 | Freq: Once | INTRAVENOUS | Status: AC
  Administered 2020-03-11: 150 mg via INTRAVENOUS
  Filled 2020-03-11: qty 60

## 2020-03-11 MED ORDER — PROCHLORPERAZINE MALEATE 10 MG PO TABS
10.0000 mg | ORAL_TABLET | Freq: Once | ORAL | Status: AC
  Administered 2020-03-11: 10 mg via ORAL

## 2020-03-11 MED ORDER — METHYLPREDNISOLONE SODIUM SUCC 125 MG IJ SOLR
60.0000 mg | Freq: Once | INTRAMUSCULAR | Status: AC
  Administered 2020-03-11: 60 mg via INTRAVENOUS

## 2020-03-11 MED ORDER — SODIUM CHLORIDE 0.9 % IV SOLN
Freq: Once | INTRAVENOUS | Status: AC
  Filled 2020-03-11: qty 250

## 2020-03-11 MED ORDER — DIPHENHYDRAMINE HCL 25 MG PO CAPS
50.0000 mg | ORAL_CAPSULE | Freq: Once | ORAL | Status: AC
  Administered 2020-03-11: 50 mg via ORAL

## 2020-03-11 MED ORDER — ACETAMINOPHEN 325 MG PO TABS
650.0000 mg | ORAL_TABLET | Freq: Once | ORAL | Status: AC
  Administered 2020-03-11: 650 mg via ORAL

## 2020-03-11 NOTE — Patient Instructions (Addendum)
[facility] Discharge Instructions for Patients Receiving Chemotherapy  Today you received the following chemotherapy agents:  Kyprolis  To help prevent nausea and vomiting after your treatment, we encourage you to take your nausea medication as directed.   If you develop nausea and vomiting that is not controlled by your nausea medication, call the clinic.   BELOW ARE SYMPTOMS THAT SHOULD BE REPORTED IMMEDIATELY:  *FEVER GREATER THAN 100.5 F  *CHILLS WITH OR WITHOUT FEVER  NAUSEA AND VOMITING THAT IS NOT CONTROLLED WITH YOUR NAUSEA MEDICATION  *UNUSUAL SHORTNESS OF BREATH  *UNUSUAL BRUISING OR BLEEDING  TENDERNESS IN MOUTH AND THROAT WITH OR WITHOUT PRESENCE OF ULCERS  *URINARY PROBLEMS  *BOWEL PROBLEMS  UNUSUAL RASH Items with * indicate a potential emergency and should be followed up as soon as possible.  Feel free to call the clinic should you have any questions or concerns. The clinic phone number is [phone].  Please show the CHEMO ALERT CARD at check-in to the Emergency Department and triage nurse.   

## 2020-03-17 LAB — MULTIPLE MYELOMA PANEL, SERUM
Albumin SerPl Elph-Mcnc: 3.8 g/dL (ref 2.9–4.4)
Albumin/Glob SerPl: 1.8 — ABNORMAL HIGH (ref 0.7–1.7)
Alpha 1: 0.2 g/dL (ref 0.0–0.4)
Alpha2 Glob SerPl Elph-Mcnc: 0.6 g/dL (ref 0.4–1.0)
B-Globulin SerPl Elph-Mcnc: 0.9 g/dL (ref 0.7–1.3)
Gamma Glob SerPl Elph-Mcnc: 0.5 g/dL (ref 0.4–1.8)
Globulin, Total: 2.2 g/dL (ref 2.2–3.9)
IgA: 6 mg/dL — ABNORMAL LOW (ref 61–437)
IgG (Immunoglobin G), Serum: 493 mg/dL — ABNORMAL LOW (ref 603–1613)
IgM (Immunoglobulin M), Srm: <5 mg/dL — ABNORMAL LOW (ref 20–172)
M Protein SerPl Elph-Mcnc: 0.2 g/dL — ABNORMAL HIGH
Total Protein ELP: 6 g/dL (ref 6.0–8.5)

## 2020-03-19 NOTE — Progress Notes (Signed)
Pharmacist Chemotherapy Monitoring - Follow Up Assessment    I verify that I have reviewed each item in the below checklist:  . Regimen for the patient is scheduled for the appropriate day and plan matches scheduled date. [name] Appropriate non-routine labs are ordered dependent on drug ordered. . If applicable, additional medications reviewed and ordered per protocol based on lifetime cumulative doses and/or treatment regimen.   Plan for follow-up and/or issues identified: No . I-vent associated with next due treatment: No . MD and/or nursing notified: No  Kevontae Burgoon D 03/19/2020 1:19 PM

## 2020-03-25 ENCOUNTER — Other Ambulatory Visit: Payer: Self-pay

## 2020-03-25 ENCOUNTER — Inpatient Hospital Stay: Payer: Medicare Other | Attending: Hematology

## 2020-03-25 ENCOUNTER — Inpatient Hospital Stay: Payer: Medicare Other

## 2020-03-25 ENCOUNTER — Other Ambulatory Visit: Payer: Self-pay | Admitting: Hematology

## 2020-03-25 VITALS — BP 156/80 | HR 67 | Temp 98.9°F | Resp 16

## 2020-03-25 DIAGNOSIS — Z5112 Encounter for antineoplastic immunotherapy: Secondary | ICD-10-CM | POA: Insufficient documentation

## 2020-03-25 DIAGNOSIS — Z79899 Other long term (current) drug therapy: Secondary | ICD-10-CM | POA: Insufficient documentation

## 2020-03-25 DIAGNOSIS — Z7189 Other specified counseling: Secondary | ICD-10-CM

## 2020-03-25 DIAGNOSIS — C9000 Multiple myeloma not having achieved remission: Secondary | ICD-10-CM | POA: Diagnosis present

## 2020-03-25 DIAGNOSIS — E559 Vitamin D deficiency, unspecified: Secondary | ICD-10-CM

## 2020-03-25 LAB — CBC WITH DIFFERENTIAL/PLATELET
Abs Immature Granulocytes: 0.03 10*3/uL (ref 0.00–0.07)
Basophils Absolute: 0.1 10*3/uL (ref 0.0–0.1)
Basophils Relative: 1 %
Eosinophils Absolute: 0.2 10*3/uL (ref 0.0–0.5)
Eosinophils Relative: 5 %
HCT: 38 % — ABNORMAL LOW (ref 39.0–52.0)
Hemoglobin: 12.8 g/dL — ABNORMAL LOW (ref 13.0–17.0)
Immature Granulocytes: 1 %
Lymphocytes Relative: 16 %
Lymphs Abs: 0.7 10*3/uL (ref 0.7–4.0)
MCH: 33 pg (ref 26.0–34.0)
MCHC: 33.7 g/dL (ref 30.0–36.0)
MCV: 97.9 fL (ref 80.0–100.0)
Monocytes Absolute: 0.4 10*3/uL (ref 0.1–1.0)
Monocytes Relative: 9 %
Neutro Abs: 3.2 10*3/uL (ref 1.7–7.7)
Neutrophils Relative %: 68 %
Platelets: 240 10*3/uL (ref 150–400)
RBC: 3.88 MIL/uL — ABNORMAL LOW (ref 4.22–5.81)
RDW: 12.5 % (ref 11.5–15.5)
WBC: 4.7 10*3/uL (ref 4.0–10.5)
nRBC: 0 % (ref 0.0–0.2)

## 2020-03-25 LAB — CMP (CANCER CENTER ONLY)
ALT: 15 U/L (ref 0–44)
AST: 12 U/L — ABNORMAL LOW (ref 15–41)
Albumin: 3.9 g/dL (ref 3.5–5.0)
Alkaline Phosphatase: 61 U/L (ref 38–126)
Anion gap: 8 (ref 5–15)
BUN: 10 mg/dL (ref 8–23)
CO2: 23 mmol/L (ref 22–32)
Calcium: 9 mg/dL (ref 8.9–10.3)
Chloride: 108 mmol/L (ref 98–111)
Creatinine: 0.77 mg/dL (ref 0.61–1.24)
GFR, Est AFR Am: >60 mL/min (ref >60)
GFR, Estimated: >60 mL/min (ref >60)
Glucose, Bld: 107 mg/dL — ABNORMAL HIGH (ref 70–99)
Potassium: 3.9 mmol/L (ref 3.5–5.1)
Sodium: 139 mmol/L (ref 135–145)
Total Bilirubin: 1.1 mg/dL (ref 0.3–1.2)
Total Protein: 6.2 g/dL — ABNORMAL LOW (ref 6.5–8.1)

## 2020-03-25 LAB — VITAMIN D 25 HYDROXY (VIT D DEFICIENCY, FRACTURES): Vit D, 25-Hydroxy: 81.28 ng/mL (ref 30–100)

## 2020-03-25 MED ORDER — PROCHLORPERAZINE MALEATE 10 MG PO TABS
ORAL_TABLET | ORAL | Status: AC
  Filled 2020-03-25: qty 1

## 2020-03-25 MED ORDER — PROCHLORPERAZINE MALEATE 10 MG PO TABS
10.0000 mg | ORAL_TABLET | Freq: Once | ORAL | Status: AC
  Administered 2020-03-25: 10 mg via ORAL

## 2020-03-25 MED ORDER — ACETAMINOPHEN 325 MG PO TABS
650.0000 mg | ORAL_TABLET | Freq: Once | ORAL | Status: AC
  Administered 2020-03-25: 650 mg via ORAL

## 2020-03-25 MED ORDER — DIPHENHYDRAMINE HCL 25 MG PO CAPS
ORAL_CAPSULE | ORAL | Status: AC
  Filled 2020-03-25: qty 2

## 2020-03-25 MED ORDER — ACETAMINOPHEN 325 MG PO TABS
ORAL_TABLET | ORAL | Status: AC
  Filled 2020-03-25: qty 2

## 2020-03-25 MED ORDER — SODIUM CHLORIDE 0.9 % IV SOLN
Freq: Once | INTRAVENOUS | Status: AC
  Filled 2020-03-25: qty 250

## 2020-03-25 MED ORDER — DIPHENHYDRAMINE HCL 25 MG PO CAPS
50.0000 mg | ORAL_CAPSULE | Freq: Once | ORAL | Status: AC
  Administered 2020-03-25: 50 mg via ORAL

## 2020-03-25 MED ORDER — METHYLPREDNISOLONE SODIUM SUCC 125 MG IJ SOLR
60.0000 mg | Freq: Once | INTRAMUSCULAR | Status: AC
  Administered 2020-03-25: 60 mg via INTRAVENOUS

## 2020-03-25 MED ORDER — DEXTROSE 5 % IV SOLN
68.0000 mg/m2 | Freq: Once | INTRAVENOUS | Status: AC
  Administered 2020-03-25: 150 mg via INTRAVENOUS
  Filled 2020-03-25: qty 60

## 2020-03-25 MED ORDER — METHYLPREDNISOLONE SODIUM SUCC 125 MG IJ SOLR
INTRAMUSCULAR | Status: AC
  Filled 2020-03-25: qty 2

## 2020-03-25 NOTE — Patient Instructions (Signed)
[facility] Discharge Instructions for Patients Receiving Chemotherapy  Today you received the following chemotherapy agents:  Kyprolis  To help prevent nausea and vomiting after your treatment, we encourage you to take your nausea medication as directed.   If you develop nausea and vomiting that is not controlled by your nausea medication, call the clinic.   BELOW ARE SYMPTOMS THAT SHOULD BE REPORTED IMMEDIATELY:  *FEVER GREATER THAN 100.5 F  *CHILLS WITH OR WITHOUT FEVER  NAUSEA AND VOMITING THAT IS NOT CONTROLLED WITH YOUR NAUSEA MEDICATION  *UNUSUAL SHORTNESS OF BREATH  *UNUSUAL BRUISING OR BLEEDING  TENDERNESS IN MOUTH AND THROAT WITH OR WITHOUT PRESENCE OF ULCERS  *URINARY PROBLEMS  *BOWEL PROBLEMS  UNUSUAL RASH Items with * indicate a potential emergency and should be followed up as soon as possible.  Feel free to call the clinic should you have any questions or concerns. The clinic phone number is [phone].  Please show the CHEMO ALERT CARD at check-in to the Emergency Department and triage nurse.   

## 2020-03-26 DIAGNOSIS — Z7952 Long term (current) use of systemic steroids: Secondary | ICD-10-CM | POA: Diagnosis not present

## 2020-03-26 DIAGNOSIS — R7303 Prediabetes: Secondary | ICD-10-CM | POA: Diagnosis not present

## 2020-03-26 DIAGNOSIS — Z0000 Encounter for general adult medical examination without abnormal findings: Secondary | ICD-10-CM | POA: Diagnosis not present

## 2020-03-26 DIAGNOSIS — Z125 Encounter for screening for malignant neoplasm of prostate: Secondary | ICD-10-CM | POA: Diagnosis not present

## 2020-03-26 DIAGNOSIS — Z87898 Personal history of other specified conditions: Secondary | ICD-10-CM | POA: Diagnosis not present

## 2020-03-26 DIAGNOSIS — E7800 Pure hypercholesterolemia, unspecified: Secondary | ICD-10-CM | POA: Diagnosis not present

## 2020-03-27 DIAGNOSIS — Z7952 Long term (current) use of systemic steroids: Secondary | ICD-10-CM | POA: Diagnosis not present

## 2020-03-27 DIAGNOSIS — E7800 Pure hypercholesterolemia, unspecified: Secondary | ICD-10-CM | POA: Diagnosis not present

## 2020-03-27 DIAGNOSIS — Z6832 Body mass index (BMI) 32.0-32.9, adult: Secondary | ICD-10-CM | POA: Diagnosis not present

## 2020-03-27 DIAGNOSIS — G4733 Obstructive sleep apnea (adult) (pediatric): Secondary | ICD-10-CM | POA: Diagnosis not present

## 2020-03-27 DIAGNOSIS — I700 Atherosclerosis of aorta: Secondary | ICD-10-CM | POA: Diagnosis not present

## 2020-03-27 DIAGNOSIS — K219 Gastro-esophageal reflux disease without esophagitis: Secondary | ICD-10-CM | POA: Diagnosis not present

## 2020-03-27 DIAGNOSIS — C9000 Multiple myeloma not having achieved remission: Secondary | ICD-10-CM | POA: Diagnosis not present

## 2020-03-27 DIAGNOSIS — E6609 Other obesity due to excess calories: Secondary | ICD-10-CM | POA: Diagnosis not present

## 2020-03-27 DIAGNOSIS — J309 Allergic rhinitis, unspecified: Secondary | ICD-10-CM | POA: Diagnosis not present

## 2020-03-28 LAB — MULTIPLE MYELOMA PANEL, SERUM
Albumin SerPl Elph-Mcnc: 3.6 g/dL (ref 2.9–4.4)
Albumin/Glob SerPl: 1.5 (ref 0.7–1.7)
Alpha 1: 0.2 g/dL (ref 0.0–0.4)
Alpha2 Glob SerPl Elph-Mcnc: 0.6 g/dL (ref 0.4–1.0)
B-Globulin SerPl Elph-Mcnc: 1 g/dL (ref 0.7–1.3)
Gamma Glob SerPl Elph-Mcnc: 0.7 g/dL (ref 0.4–1.8)
Globulin, Total: 2.5 g/dL (ref 2.2–3.9)
IgA: 6 mg/dL — ABNORMAL LOW (ref 61–437)
IgG (Immunoglobin G), Serum: 659 mg/dL (ref 603–1613)
IgM (Immunoglobulin M), Srm: <5 mg/dL — ABNORMAL LOW (ref 20–172)
M Protein SerPl Elph-Mcnc: 0.5 g/dL — ABNORMAL HIGH
Total Protein ELP: 6.1 g/dL (ref 6.0–8.5)

## 2020-04-05 ENCOUNTER — Encounter: Payer: Self-pay | Admitting: Hematology

## 2020-04-05 ENCOUNTER — Other Ambulatory Visit: Payer: Self-pay | Admitting: Hematology

## 2020-04-05 DIAGNOSIS — C9000 Multiple myeloma not having achieved remission: Secondary | ICD-10-CM

## 2020-04-05 DIAGNOSIS — Z7189 Other specified counseling: Secondary | ICD-10-CM

## 2020-04-06 MED ORDER — ACYCLOVIR 400 MG PO TABS: 400.0000 mg | ORAL_TABLET | Freq: Two times a day (BID) | ORAL | 0 refills | Status: DC

## 2020-04-06 NOTE — Telephone Encounter (Signed)
Please review for refill.  

## 2020-04-07 NOTE — Progress Notes (Signed)
HEMATOLOGY/ONCOLOGY CLINIC NOTE  Date of Service: 04/08/20     Patient Care Team: [name], MD as PCP - General (Family Medicine) [name], MD as Consulting Physician (Hematology) [name], MD as Attending Physician (Radiation Oncology) [name], RN (Inactive) as Oncology Nurse Navigator (Oncology) [name] MD ([facility] _ primary oncology) phone number [phone] Email- [email or web address]  CHIEF COMPLAINTS  followup of multiple myeloma.  HISTORY OF PRESENTING ILLNESS:   Michael [name] is a wonderful 69 y.o. 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 Patient is currently on a study protocol and is being monitored for myeloma recurrence.  Based on available records being put together  -Patient was diagnosed with IgG kappa ISS stage I multiple myeloma with hyperdiploid complex cytogenetics in 2012. Bone marrow biopsy apparently showed 50% kappa restricted plasma cells with an initial M spike of 3.4 g/dL with evidence of lytic lesions on his PET/CT scan including right rib fracture and lesions of the lumbar and cervical spine. -Patient was treated under protocol 11-C-0221 with from 02/21/2013 with  Carfilzomib/Revlimid/Dexamethasone for a total of 8 cycles. He reports that his stem cells were collected and stored after 4 cycles -Posttreatment bone marrow biopsy showed less than 5% plasma cells with a negative flow cytometry and improvement in his previously PET avid lesions. Some concern for new focus of activity at C2 lytic lesion at L4 and several small lesions throughout the vertebrae. -Patient was on maintenance lenalidomide 10  mg by mouth daily for 2 years or more until end of 2014. 08/19/2013 - bone marrow biopsy showed normocellular marrow with less than 5% plasma cells. PET CT scan showed decreased focal metabolic ligament prior rib lesions and no new lesions. Flow cytometry was negative for residual disease. Serum and urine electrophoresis and IFE showed no evidence of monoclonal protein. -Patient notes that he was off protocol for a few years due to lack of clinical trial research funding. -Patient notes that he is back on a follow-up protocol and continues to follow and receive his primary treatment at [facility] at this time. -Patient reports he had his last PET CT scan blood and urine tests and bone marrow examination in February 2018. The results of which are not available to [place] currently.  He notes that there was concern for a very slowly growing sternal FDG avid lesion that has been present for years. He has a follow-up at [facility] next month to determine the management of this lesion. He reports that was some consideration of considering radiation. he notes that this lesion has not been biopsied. No other focal bone pains at this time.  We discussed in detail about what our role will be and noted that we shall be available for any acute issues that need to be taken care of locally but that at this time the primary treatment and evaluation will be driven by his team at [facility] as per their research protocols and input. The patient and his wife are clearly aware of this and are in agreement with this plan.  INTERVAL HISTORY Michael [name] is here for continued management of his Multiple Myeloma. He is here for C19D15 of maintenance Carfilzomib. The patient's last visit  with [place] was on 02/11/2020. The pt reports that he is doing well overall.  The pt reports that his hip has continued to bother him. Pt has been volunteering at the zoo to stay active. He has a spot at the top of his skull that bothers him and makes a  "crunch" sound when he presses it.   Lab results today (04/08/20) of CBC w/diff and CMP is as follows: all values are WNL except for RBC at 3.81, Hgb at 12.6, HCT at 37.0, Glucose at 116, AST at 14, Total Bilirubin at 1.3.  On review of systems, pt reports chronic hip pain and denies fevers, chills, night sweats and any other symptoms.    MEDICAL HISTORY:  Past Medical History:  Diagnosis Date  . Angioedema   . CPAP (continuous positive airway pressure) dependence    uses at night  . Gastroesophageal reflux disease without esophagitis   . History of radiation therapy 06/04/18- 06/15/18   25 Gy in 10 fractions to the laryngeal/ cartilage lesion  . Hyperlipidemia   . Multiple myeloma ([place]) 2012   Treated at [facility]  . Non morbid obesity due to excess calories   . Prediabetes   . Prediabetes   . Unspecified glaucoma(365.9)   . Urticaria, unspecified   Diabetes mellitus Glaucoma Left toes neuropathy  GERD  SURGICAL HISTORY: Past Surgical History:  Procedure Laterality Date  . IR RADIOLOGIST EVAL & MGMT  11/13/2018  . PORTACATH PLACEMENT    . PORTACATH PLACEMENT     removed 02/2016    SOCIAL HISTORY: Social History   Socioeconomic History  . Marital status: Married    Spouse name: Not on file  . Number of children: 2  . Years of education: Not on file  . Highest education level: Not on file  Occupational History  . Occupation: Retired Chief Strategy Officer for [employer]  . Smoking status: Never Smoker  . Smokeless tobacco: Never Used  Vaping Use  . Vaping Use: Never used  Substance and Sexual Activity  . Alcohol use: Yes    Alcohol/week: 0.0 standard drinks    Comment: Occasional beer  . Drug use: No  . Sexual activity: Not on file  Other Topics Concern  . Not on file  Social History Narrative   Unable to [employer] partner violence- partner in room    Social Determinants of Health   Financial Resource Strain:   . Difficulty of Paying Living Expenses:     Food Insecurity:   . Worried About Charity fundraiser in the Last Year:   . Arboriculturist in the Last Year:   Transportation Needs:   . Film/video editor (Medical):   [name] Lack of Transportation (Non-Medical):   Physical Activity:   . Days of Exercise per Week:   . Minutes of Exercise per Session:   Stress:   . Feeling of Stress :   Social Connections:   . Frequency of Communication with Friends and Family:   . Frequency of Social Gatherings with Friends and Family:   . Attends Religious Services:   . Active Member of Clubs or Organizations:   . Attends Archivist Meetings:   [name] Marital Status:   Intimate Partner Violence:   . Fear of Current or Ex-Partner:   . Emotionally Abused:   [name] Physically Abused:   . Sexually Abused:   Never smoker Married Retired from the [employer] in June 2017 and moved to [facility]  [place].  FAMILY HISTORY: Family History  Problem Relation Age of Onset  . Cancer Father     ALLERGIES:  has No Known Allergies.  MEDICATIONS:  Current Outpatient Medications  Medication Sig Dispense Refill  . acyclovir (ZOVIRAX) 400 MG tablet Take 1 tablet (400 mg total) by mouth 2 (two) times daily. 60 tablet 0  . aspirin 81 MG tablet Take 81 mg by mouth daily.    [name] atorvastatin (LIPITOR) 20 MG tablet Take 20 mg by mouth every evening.     . Calcium Citrate-Vitamin D (CALCIUM CITRATE + D3 MAXIMUM PO) Take 630 mg by mouth.     . dexamethasone (DECADRON) 4 MG tablet 34m (5 tabs) with breakfast the day after each daratumumab treatment 20 tablet 4  . ergocalciferol (VITAMIN D2) 1.25 MG (50000 UT) capsule Take 1 capsule (50,000 Units total) by mouth 2 (two) times a week. 24 capsule 3  . glucose blood (FREESTYLE LITE) test strip     . Multiple Vitamin (MULTIVITAMIN) tablet Take 1 tablet by mouth daily.    . ondansetron (ZOFRAN) 8 MG tablet Take 1 tablet (8 mg total) by mouth 2 (two) times daily as needed (Nausea or vomiting). 30 tablet 1  .  pantoprazole (PROTONIX) 40 MG tablet Take 40 mg by mouth daily.     . prochlorperazine (COMPAZINE) 10 MG tablet Take 1 tablet (10 mg total) by mouth every 6 (six) hours as needed (Nausea or vomiting). 30 tablet 1  . oxyCODONE (OXY IR/ROXICODONE) 5 MG immediate release tablet Take 1-2 tablets (5-10 mg total) by mouth every 4 (four) hours as needed for severe pain or breakthrough pain. (Patient not taking: Reported on 01/15/2020) 60 tablet 0  . senna-docusate (SENOKOT-S) 8.6-50 MG tablet Take 2 tablets by mouth at bedtime. Takes 50 mg (Patient not taking: Reported on 12/17/2019) 60 tablet 3   No current facility-administered medications for this visit.    REVIEW OF SYSTEMS:   A 10+ POINT REVIEW OF SYSTEMS WAS OBTAINED including neurology, dermatology, psychiatry, cardiac, respiratory, lymph, extremities, GI, GU, Musculoskeletal, constitutional, breasts, reproductive, HEENT.  All pertinent positives are noted in the HPI.  All others are negative.   PHYSICAL EXAMINATION: ECOG PERFORMANCE STATUS: 1 - Symptomatic but completely ambulatory  Vitals:   04/08/20 0944  BP: (!) 151/77  Pulse: 67  Resp: 20  Temp: 97.7 F (36.5 C)  TempSrc: Temporal  SpO2: 100%  Weight: 252 lb 4.8 oz (114.4 kg)  Height: 6' 1" (1.854 m)   Body mass index is 33.29 kg/m.   Exam was given in a chair   GENERAL:alert, in no acute distress and comfortable SKIN: no acute rashes, no significant lesions EYES: conjunctiva are pink and non-injected, sclera anicteric OROPHARYNX: MMM, no exudates, no oropharyngeal erythema or ulceration NECK: supple, no JVD LYMPH:  no palpable lymphadenopathy in the cervical, axillary or inguinal regions LUNGS: clear to auscultation b/l with normal respiratory effort HEART: regular rate & rhythm ABDOMEN:  normoactive bowel sounds , non tender, not distended. No palpable hepatosplenomegaly.  Extremity: no pedal edema PSYCH: alert & oriented x 3 with fluent speech NEURO: no focal  motor/sensory deficits  LABORATORY DATA:  I have reviewed the data as listed . CBC Latest Ref Rng & Units 04/08/2020 03/25/2020 03/11/2020  WBC 4.0 - 10.5 K/uL 4.2 4.7 4.9  Hemoglobin 13.0 - 17.0 g/dL 12.6(L) 12.8(L) 12.3(L)  Hematocrit 39 - 52 % 37.0(L) 38.0(L) 36.9(L)  Platelets 150 - 400 K/uL 235 240 207    . CMP  Latest Ref Rng & Units 04/08/2020 03/25/2020 03/11/2020  Glucose 70 - 99 mg/dL 116(H) 107(H) 153(H)  BUN 8 - 23 mg/dL _0 Creatinine 0.61 - 1.24 mg/dL 0.87 0.77 0.79  Sodium 135 - 145 mmol/L 140 139 140  Potassium 3.5 - 5.1 mmol/L 3.7 3.9 3.8  Chloride 98 - 111 mmol/L 108 108 108  CO2 22 - 32 mmol/L _1 Calcium 8.9 - 10.3 mg/dL 8.9 9.0 9.0  Total Protein 6.5 - 8.1 g/dL 6.6 6.2(L) 6.2(L)  Total Bilirubin 0.3 - 1.2 mg/dL 1.3(H) 1.1 0.9  Alkaline Phos 38 - 126 U/L 67 61 70  AST 15 - 41 U/L 14(L) 12(L) 12(L)  ALT 0 - 44 U/L _2 08/16/2019 BM Bx Report ([id number]):   08/16/2019 FISH Panel:      05/08/18 Left Neck Thyroid Cartilage Biopsy:        RADIOGRAPHIC STUDIES: I have personally reviewed the radiological images as listed and agreed with the findings in the report. No results found.  ASSESSMENT & PLAN:   69 y.o. 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 Bone marrow biopsy apparently showed 50% kappa restricted plasma cells with an initial M spike of 3.4 g/dL with evidence of lytic lesions on his PET/CT scan including right rib fracture and lesions of the lumbar and cervical spine. -Patient was treated under protocol 11-C-0221 with from 02/21/2013 with  Carfilzomib/Revlimid/Dexamethasone for a total of 8 cycles. He reports that his stem cells were collected and stored after 4 cycles -Posttreatment bone marrow biopsy showed less than 5% plasma cells with a negative flow cytometry and improvement in his  previously PET avid lesions. Some concern for new focus of activity at C2 lytic lesion at L4 and several small lesions throughout the vertebrae. -Patient was on maintenance lenalidomide 10 mg by mouth daily for 2 years or more until end of 2014. 08/19/2013 - bone marrow biopsy showed normocellular marrow with less than 5% plasma cells. PET CT scan showed decreased focal metabolic ligament prior rib lesions and no new lesions. Flow cytometry was negative for residual disease. Serum and urine electrophoresis and IFE showed no evidence of monoclonal protein. -Patient notes that he was off protocol for a few years due to lack of clinical trial research funding. -Patient returned to follow-up protocol and began to follow and receive his primary treatment at [facility]  -Patient's labs show minimal IgG kappa protein on IFE and a CT chest in May 2018 that did not show any overt signs of myeloma progression in the bones.  Rpt Myeloma labs 03/2017 - M spike of 0.3g/dl with a repeat M spike stable at 0.3 g/dL on 05/23/2017 which remains stable on labs today 07/25/17 Serum kappa lambda free light chain ratio not significantly changed. PET/CT scan showed a single metabolically active sternal lesion without any overt bony destruction. Bone marrow biopsy done on 06/06/2017 - shows no overt involvement with multiple myeloma.  04/24/18 CT Soft Tissue Neck revealed Left laryngeal 3 cm soft tissue mass appears to have originated within and is expanding the left thyroid cartilage. This is new since the August 2018 PET-CT, and there is absent lymphadenopathy. 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 Primary cartilaginous neoplasm, squamous cell carcinoma, and other differential considerations are less likely. Superimposed widespread multiple myeloma lesions in the visible skeleton.  05/08/18 Left neck thyroid cartilage biopsy revealed Plasma cell neoplasm.  05/22/18 PET/CT revealed Soft tissue  mass centered around the destroyed left thyroid cartilage is hypermetabolic and consistent with known plasmacytoma. No adenopathy in the neck. 2. Hypermetabolic 3 cm cutaneous and subcutaneous soft tissue mass involving the posterior upper thorax suspicious for cutaneous manifestation of myeloma (plasmacytoma). 3. Persistent and slightly progressive hypermetabolic sternal lesion. 4. Diffuse lytic myelomatous lesions throughout the bony structures but no other hypermetabolic foci. 5. Focus of hypermetabolism in the left aspect of the lower prostate gland, not present on the prior study. This could be an area of infection/inflammation or potential prostate cancer. Recommend correlation with physical examination and PSA level.    Completed RT between 06/04/18 and 06/15/18 of 25 Gy by 10 fractions  The pt pursued a clinical trial at the [facility] with RT and immunotherapy in October 2019 through October 18, 2018 with further information available at [email or web address]  10/18/18 M Protein increased to 3.5g, from 0.7g at our last visit on 07/10/18. 10/02/18 PET/CT from [facility] indicated new hypermetabolism in left clavicle, left scapula, bilateral ribs, spine, and left proximal femur 10/18/18 MRI from [facility] ndicated a pathologic fracture at T12  10/2018 M Protein at 4.1g  11/23/18 NM Bone revealed Patchy uptake throughout the ribs and spine with areas of focally increased activity in both T12 pedicles and the right L1 pedicle. This focal activity could be stress related without clear corresponding fracture on available prior studies.  08/16/2019 FISH Panel which revealed "No mutations detected."  08/16/2019 BM Bx Surgical Pathology [phone]) which revealed  "DIAGNOSIS: BONE MARROW, ASPIRATE, CLOT, CORE: -Hypercellular bone marrow for age with trilineage hematopoiesis and less than 1% plasma cells PERIPHERAL BLOOD: -Macrocytic anemia -Leukocytosis ".  09/03/2019 PET/CT scan ([id number]) revealed   "1. No evidence of active multiple myeloma on whole-body FDG PET scan. 2. Stable lytic lesions within the spine and sternum consistent treated metastasis. 3. No soft tissue plasmacytoma. 4. New compression fracture at T12 compared to PET-CT scan 10/02/2018."  2) h/o Elevated bilirubin level . ? [place]  3) PET/CT abnormality in the Prostate. PSA levels WNL -Primary care physician to consider urology referral  4) Abnormal focus of FDG avid at a spot in the liver - will need to be monitor on f/u scans. No pain or other symptoms at this time.  PLAN:  -Discussed pt labwork today, 04/08/20; blood counts and chemistries are stable  -Last MMP (03/25/2020) shows M Protein at 0.5 g/dL - M Protein beginning to rise again  -Last Vitamin D 25 (OH) at 81.28 - looks good -The pt has no prohibitive toxicities from continuing C19D15 of Carfilzomib at this time. -Will keep current dose of Carfilzomib and add in Daratumumab with next cycle to control disease.  -Recommend pt avoid bending at the hip and lifting heavy objects.  -Discussed referring pt for a transplant consultation while disease is still controlled.  -Recommend pt continue twice weekly 50K UT Vitamin D  -Will see back in 4 weeks with labs    FOLLOW UP: -Changing treatment plan to Carfilzomib + Daratumumab q2weeks. Please schedule next 4 doses with port flush and labs -MD visit in 4 weeks    The total time spent in the appt was 30 minutes and more than 50% was on counseling and direct patient cares.  All of the patient's questions were answered with apparent satisfaction. The patient knows to call the clinic with any problems, questions or concerns.    Sullivan Lone MD  MS AAHIVMS [facility] Hematology/Oncology Physician [facility]  (Office):       [phone] (Work cell):  [phone] (Fax):           [phone]   I, Yevette Edwards, am acting as a scribe for Dr. Sullivan Lone.   .I have reviewed the above  documentation for accuracy and completeness, and I agree with the above. [name] MD

## 2020-04-08 ENCOUNTER — Other Ambulatory Visit: Payer: Medicare Other

## 2020-04-08 ENCOUNTER — Other Ambulatory Visit: Payer: Self-pay

## 2020-04-08 ENCOUNTER — Inpatient Hospital Stay: Payer: Medicare Other

## 2020-04-08 ENCOUNTER — Inpatient Hospital Stay (HOSPITAL_BASED_OUTPATIENT_CLINIC_OR_DEPARTMENT_OTHER): Payer: Medicare Other | Admitting: Hematology

## 2020-04-08 ENCOUNTER — Ambulatory Visit: Payer: Medicare Other

## 2020-04-08 VITALS — BP 151/77 | HR 67 | Temp 97.7°F | Resp 20 | Ht 73.0 in | Wt 252.3 lb

## 2020-04-08 DIAGNOSIS — E559 Vitamin D deficiency, unspecified: Secondary | ICD-10-CM | POA: Diagnosis not present

## 2020-04-08 DIAGNOSIS — C9000 Multiple myeloma not having achieved remission: Secondary | ICD-10-CM

## 2020-04-08 DIAGNOSIS — Z7189 Other specified counseling: Secondary | ICD-10-CM

## 2020-04-08 DIAGNOSIS — Z5112 Encounter for antineoplastic immunotherapy: Secondary | ICD-10-CM | POA: Diagnosis not present

## 2020-04-08 LAB — CBC WITH DIFFERENTIAL/PLATELET
Abs Immature Granulocytes: 0.02 10*3/uL (ref 0.00–0.07)
Basophils Absolute: 0 10*3/uL (ref 0.0–0.1)
Basophils Relative: 1 %
Eosinophils Absolute: 0.3 10*3/uL (ref 0.0–0.5)
Eosinophils Relative: 7 %
HCT: 37 % — ABNORMAL LOW (ref 39.0–52.0)
Hemoglobin: 12.6 g/dL — ABNORMAL LOW (ref 13.0–17.0)
Immature Granulocytes: 1 %
Lymphocytes Relative: 18 %
Lymphs Abs: 0.8 10*3/uL (ref 0.7–4.0)
MCH: 33.1 pg (ref 26.0–34.0)
MCHC: 34.1 g/dL (ref 30.0–36.0)
MCV: 97.1 fL (ref 80.0–100.0)
Monocytes Absolute: 0.5 10*3/uL (ref 0.1–1.0)
Monocytes Relative: 12 %
Neutro Abs: 2.6 10*3/uL (ref 1.7–7.7)
Neutrophils Relative %: 61 %
Platelets: 235 10*3/uL (ref 150–400)
RBC: 3.81 MIL/uL — ABNORMAL LOW (ref 4.22–5.81)
RDW: 12.7 % (ref 11.5–15.5)
WBC: 4.2 10*3/uL (ref 4.0–10.5)
nRBC: 0 % (ref 0.0–0.2)

## 2020-04-08 LAB — CMP (CANCER CENTER ONLY)
ALT: 18 U/L (ref 0–44)
AST: 14 U/L — ABNORMAL LOW (ref 15–41)
Albumin: 3.9 g/dL (ref 3.5–5.0)
Alkaline Phosphatase: 67 U/L (ref 38–126)
Anion gap: 9 (ref 5–15)
BUN: 10 mg/dL (ref 8–23)
CO2: 23 mmol/L (ref 22–32)
Calcium: 8.9 mg/dL (ref 8.9–10.3)
Chloride: 108 mmol/L (ref 98–111)
Creatinine: 0.87 mg/dL (ref 0.61–1.24)
GFR, Est AFR Am: >60 mL/min (ref >60)
GFR, Estimated: >60 mL/min (ref >60)
Glucose, Bld: 116 mg/dL — ABNORMAL HIGH (ref 70–99)
Potassium: 3.7 mmol/L (ref 3.5–5.1)
Sodium: 140 mmol/L (ref 135–145)
Total Bilirubin: 1.3 mg/dL — ABNORMAL HIGH (ref 0.3–1.2)
Total Protein: 6.6 g/dL (ref 6.5–8.1)

## 2020-04-08 MED ORDER — DIPHENHYDRAMINE HCL 25 MG PO CAPS
50.0000 mg | ORAL_CAPSULE | Freq: Once | ORAL | Status: AC
  Administered 2020-04-08: 50 mg via ORAL

## 2020-04-08 MED ORDER — PROCHLORPERAZINE MALEATE 10 MG PO TABS
ORAL_TABLET | ORAL | Status: AC
  Filled 2020-04-08: qty 1

## 2020-04-08 MED ORDER — DEXTROSE 5 % IV SOLN
68.0000 mg/m2 | Freq: Once | INTRAVENOUS | Status: AC
  Administered 2020-04-08: 150 mg via INTRAVENOUS
  Filled 2020-04-08: qty 60

## 2020-04-08 MED ORDER — METHYLPREDNISOLONE SODIUM SUCC 125 MG IJ SOLR
60.0000 mg | Freq: Once | INTRAMUSCULAR | Status: AC
  Administered 2020-04-08: 60 mg via INTRAVENOUS

## 2020-04-08 MED ORDER — ACETAMINOPHEN 325 MG PO TABS
ORAL_TABLET | ORAL | Status: AC
  Filled 2020-04-08: qty 2

## 2020-04-08 MED ORDER — PROCHLORPERAZINE MALEATE 10 MG PO TABS
10.0000 mg | ORAL_TABLET | Freq: Once | ORAL | Status: AC
  Administered 2020-04-08: 10 mg via ORAL

## 2020-04-08 MED ORDER — SODIUM CHLORIDE 0.9 % IV SOLN
Freq: Once | INTRAVENOUS | Status: AC
  Filled 2020-04-08: qty 250

## 2020-04-08 MED ORDER — DIPHENHYDRAMINE HCL 25 MG PO CAPS
ORAL_CAPSULE | ORAL | Status: AC
  Filled 2020-04-08: qty 2

## 2020-04-08 MED ORDER — METHYLPREDNISOLONE SODIUM SUCC 125 MG IJ SOLR
INTRAMUSCULAR | Status: AC
  Filled 2020-04-08: qty 2

## 2020-04-08 MED ORDER — ACETAMINOPHEN 325 MG PO TABS
650.0000 mg | ORAL_TABLET | Freq: Once | ORAL | Status: AC
  Administered 2020-04-08: 650 mg via ORAL

## 2020-04-08 NOTE — Patient Instructions (Signed)
Thank you for choosing [facility] to provide your oncology and hematology care.   Should you have questions after your visit to the [facility] ([facility]), please contact this office at [phone] between 8:30 AM and 4:30 PM.  Voice mails left after 4:00 PM may not be returned until the following business day.  Calls received after 4:30 PM will be answered by an off-site Nurse Triage Line.    Prescription Refills:  Please have your pharmacy contact us directly for most prescription requests.  Contact the office directly for refills of narcotics (pain medications). Allow 48-72 hours for refills.  Appointments: Please contact the [facility] scheduling department [phone] for questions regarding [facility] appointment scheduling.  Contact the schedulers with any scheduling changes so that your appointment can be rescheduled in a timely manner.   Central Scheduling for [facility] [phone] - Call to schedule procedures such as PET scans, CT scans, MRI, Ultrasound, etc.  To afford each patient quality time with our providers, please arrive 30 minutes before your scheduled appointment time.  If you arrive late for your appointment, you may be asked to reschedule.  We strive to give you quality time with our providers, and arriving late affects you and other patients whose appointments are after yours. If you are a no show for multiple scheduled visits, you may be dismissed from the clinic at the providers discretion.     Resources: [facility] Social Workers [phone] for additional information on assistance programs or assistance connecting with community support programs   [place] DSS  336-641-3447: Information regarding food stamps, Medicaid, and utility assistance SCAT 336-333-6589   [place] [employer]'s shared-ride transportation service for eligible riders who have a disability that prevents them from riding the fixed route bus.   Medicare Rights Center  800-333-4114 Helps people with Medicare understand their rights and benefits, navigate the Medicare system, and secure the quality healthcare they deserve American [employer] 800-227-2345 Assists patients locate various types of support and financial assistance Cancer Care: 1-800-813-HOPE (4673) Provides financial assistance, online support groups, medication/co-pay assistance.   Transportation Assistance for appointments at [facility]: Transportation Coordinator [phone]  Again, thank you for choosing [facility] for your care.       

## 2020-04-17 ENCOUNTER — Telehealth: Payer: Self-pay | Admitting: Hematology

## 2020-04-17 DIAGNOSIS — M25551 Pain in right hip: Secondary | ICD-10-CM | POA: Diagnosis not present

## 2020-04-17 NOTE — Progress Notes (Signed)
Spoke with Dr. Irene Limbo and confirmed plan for daratumumab re-initiation. For first daratumumab infusion on 7/7, patient will be infused at a first time daratumumab rate. For second daratumumab infusion and thereafter, patient may be infused at the rapid rate if tolerated. This is a bit of a gray area, but since patient has tolerated rapid daratumumab previously, he will receive that rate with the second dose.   Demetrius Charity, PharmD, BCPS, [facility] Pharmacist Pharmacy Phone: [phone] 04/17/2020

## 2020-04-17 NOTE — Telephone Encounter (Signed)
Scheduled per 06/23 los, patient has been called and notified of upcoming appointments.

## 2020-04-22 ENCOUNTER — Inpatient Hospital Stay: Payer: Medicare Other

## 2020-04-22 ENCOUNTER — Ambulatory Visit: Payer: Medicare Other

## 2020-04-22 ENCOUNTER — Other Ambulatory Visit: Payer: Medicare Other

## 2020-04-23 ENCOUNTER — Other Ambulatory Visit: Payer: Self-pay

## 2020-04-23 ENCOUNTER — Inpatient Hospital Stay: Payer: Medicare Other | Attending: Hematology

## 2020-04-23 DIAGNOSIS — Z5112 Encounter for antineoplastic immunotherapy: Secondary | ICD-10-CM | POA: Diagnosis not present

## 2020-04-23 DIAGNOSIS — C9000 Multiple myeloma not having achieved remission: Secondary | ICD-10-CM

## 2020-04-23 DIAGNOSIS — Z79899 Other long term (current) drug therapy: Secondary | ICD-10-CM | POA: Diagnosis not present

## 2020-04-23 DIAGNOSIS — E559 Vitamin D deficiency, unspecified: Secondary | ICD-10-CM

## 2020-04-23 LAB — CMP (CANCER CENTER ONLY)
ALT: 21 U/L (ref 0–44)
AST: 15 U/L (ref 15–41)
Albumin: 4.1 g/dL (ref 3.5–5.0)
Alkaline Phosphatase: 81 U/L (ref 38–126)
Anion gap: 10 (ref 5–15)
BUN: 14 mg/dL (ref 8–23)
CO2: 26 mmol/L (ref 22–32)
Calcium: 10 mg/dL (ref 8.9–10.3)
Chloride: 104 mmol/L (ref 98–111)
Creatinine: 0.83 mg/dL (ref 0.61–1.24)
GFR, Est AFR Am: >60 mL/min (ref >60)
GFR, Estimated: >60 mL/min (ref >60)
Glucose, Bld: 105 mg/dL — ABNORMAL HIGH (ref 70–99)
Potassium: 3.8 mmol/L (ref 3.5–5.1)
Sodium: 140 mmol/L (ref 135–145)
Total Bilirubin: 1.4 mg/dL — ABNORMAL HIGH (ref 0.3–1.2)
Total Protein: 7 g/dL (ref 6.5–8.1)

## 2020-04-23 LAB — CBC WITH DIFFERENTIAL/PLATELET
Abs Immature Granulocytes: 0.03 10*3/uL (ref 0.00–0.07)
Basophils Absolute: 0.1 10*3/uL (ref 0.0–0.1)
Basophils Relative: 1 %
Eosinophils Absolute: 0.3 10*3/uL (ref 0.0–0.5)
Eosinophils Relative: 5 %
HCT: 38.6 % — ABNORMAL LOW (ref 39.0–52.0)
Hemoglobin: 12.9 g/dL — ABNORMAL LOW (ref 13.0–17.0)
Immature Granulocytes: 1 %
Lymphocytes Relative: 16 %
Lymphs Abs: 0.8 10*3/uL (ref 0.7–4.0)
MCH: 32.1 pg (ref 26.0–34.0)
MCHC: 33.4 g/dL (ref 30.0–36.0)
MCV: 96 fL (ref 80.0–100.0)
Monocytes Absolute: 0.5 10*3/uL (ref 0.1–1.0)
Monocytes Relative: 9 %
Neutro Abs: 3.5 10*3/uL (ref 1.7–7.7)
Neutrophils Relative %: 68 %
Platelets: 249 10*3/uL (ref 150–400)
RBC: 4.02 MIL/uL — ABNORMAL LOW (ref 4.22–5.81)
RDW: 13 % (ref 11.5–15.5)
WBC: 5.1 10*3/uL (ref 4.0–10.5)
nRBC: 0 % (ref 0.0–0.2)

## 2020-04-23 LAB — VITAMIN D 25 HYDROXY (VIT D DEFICIENCY, FRACTURES): Vit D, 25-Hydroxy: 84.73 ng/mL (ref 30–100)

## 2020-04-23 NOTE — Progress Notes (Signed)
Pharmacist Chemotherapy Monitoring - Initial Assessment    Anticipated start date: 04/24/20 - Adding daratumumab back to carfilzomib/dexamethasone.    Regimen:  . Are orders appropriate based on the patient's diagnosis, regimen, and cycle? Yes . Does the plan date match the patient's scheduled date? Yes . Is the sequencing of drugs appropriate? Yes . Are the premedications appropriate for the patient's regimen? Yes . Prior Authorization for treatment is: Approved o If applicable, is the correct biosimilar selected based on the patient's insurance? yes  Organ Function and Labs: [name] Are dose adjustments needed based on the patient's renal function, hepatic function, or hematologic function? No . Are appropriate labs ordered prior to the start of patient's treatment? Yes . Other organ system assessment, if indicated: N/A . The following baseline labs, if indicated, have been ordered: daratumumab: blood type and screen - completed prior to last daratumumab exposure  Dose Assessment: . Are the drug doses appropriate? Yes . Are the following correct: o Drug concentrations Yes o IV fluid compatible with drug Yes o Administration routes Yes o Timing of therapy Yes . If applicable, does the patient have documented access for treatment and/or plans for port-a-cath placement? not applicable . If applicable, have lifetime cumulative doses been properly documented and assessed? not applicable Lifetime Dose Tracking  No doses have been documented on this patient for the following tracked chemicals: Doxorubicin, Epirubicin, Idarubicin, Daunorubicin, Mitoxantrone, Bleomycin, Oxaliplatin, Carboplatin, Liposomal Doxorubicin  o   Toxicity Monitoring/Prevention: . The patient has the following take home antiemetics prescribed: Ondansetron and Prochlorperazine . The patient has the following take home medications prescribed: Acyclovir  . Medication allergies and previous infusion related reactions, if  applicable, have been reviewed and addressed. Yes . The patient's current medication list has been assessed for drug-drug interactions with their chemotherapy regimen. no significant drug-drug interactions were identified on review.  Order Review: . Are the treatment plan orders signed? Yes . Is the patient scheduled to see a provider prior to their treatment? No  I verify that I have reviewed each item in the above checklist and answered each question accordingly.  Demetrius Charity, PharmD, BCPS, [facility] Pharmacist Pharmacy Phone: [phone] 04/23/2020

## 2020-04-24 ENCOUNTER — Other Ambulatory Visit: Payer: Self-pay

## 2020-04-24 ENCOUNTER — Inpatient Hospital Stay: Payer: Medicare Other

## 2020-04-24 ENCOUNTER — Encounter: Payer: Self-pay | Admitting: Hematology

## 2020-04-24 VITALS — BP 159/84 | HR 71 | Temp 98.4°F | Resp 18 | Wt 244.5 lb

## 2020-04-24 DIAGNOSIS — Z7189 Other specified counseling: Secondary | ICD-10-CM

## 2020-04-24 DIAGNOSIS — Z5112 Encounter for antineoplastic immunotherapy: Secondary | ICD-10-CM | POA: Diagnosis not present

## 2020-04-24 DIAGNOSIS — C9000 Multiple myeloma not having achieved remission: Secondary | ICD-10-CM

## 2020-04-24 DIAGNOSIS — Z79899 Other long term (current) drug therapy: Secondary | ICD-10-CM | POA: Diagnosis not present

## 2020-04-24 MED ORDER — SODIUM CHLORIDE 0.9 % IV SOLN
Freq: Once | INTRAVENOUS | Status: AC
  Filled 2020-04-24: qty 250

## 2020-04-24 MED ORDER — DIPHENHYDRAMINE HCL 25 MG PO CAPS
50.0000 mg | ORAL_CAPSULE | Freq: Once | ORAL | Status: AC
  Administered 2020-04-24: 50 mg via ORAL

## 2020-04-24 MED ORDER — SODIUM CHLORIDE 0.9 % IV SOLN
16.0000 mg/kg | Freq: Once | INTRAVENOUS | Status: AC
  Administered 2020-04-24: 1700 mg via INTRAVENOUS
  Filled 2020-04-24: qty 80

## 2020-04-24 MED ORDER — ACETAMINOPHEN 325 MG PO TABS
ORAL_TABLET | ORAL | Status: AC
  Filled 2020-04-24: qty 2

## 2020-04-24 MED ORDER — PROCHLORPERAZINE MALEATE 10 MG PO TABS
10.0000 mg | ORAL_TABLET | Freq: Once | ORAL | Status: AC
  Administered 2020-04-24: 10 mg via ORAL

## 2020-04-24 MED ORDER — DEXTROSE 5 % IV SOLN
55.0000 mg/m2 | Freq: Once | INTRAVENOUS | Status: AC
  Administered 2020-04-24: 130 mg via INTRAVENOUS
  Filled 2020-04-24: qty 5

## 2020-04-24 MED ORDER — DIPHENHYDRAMINE HCL 25 MG PO CAPS
ORAL_CAPSULE | ORAL | Status: AC
  Filled 2020-04-24: qty 2

## 2020-04-24 MED ORDER — FAMOTIDINE IN NACL 20-0.9 MG/50ML-% IV SOLN
INTRAVENOUS | Status: AC
  Filled 2020-04-24: qty 50

## 2020-04-24 MED ORDER — ACETAMINOPHEN 325 MG PO TABS
650.0000 mg | ORAL_TABLET | Freq: Once | ORAL | Status: AC
  Administered 2020-04-24: 650 mg via ORAL

## 2020-04-24 MED ORDER — METHYLPREDNISOLONE SODIUM SUCC 125 MG IJ SOLR
100.0000 mg | Freq: Once | INTRAMUSCULAR | Status: AC
  Administered 2020-04-24: 100 mg via INTRAVENOUS

## 2020-04-24 MED ORDER — MONTELUKAST SODIUM 10 MG PO TABS
10.0000 mg | ORAL_TABLET | Freq: Once | ORAL | Status: AC
  Administered 2020-04-24: 10 mg via ORAL

## 2020-04-24 MED ORDER — FAMOTIDINE IN NACL 20-0.9 MG/50ML-% IV SOLN
20.0000 mg | Freq: Once | INTRAVENOUS | Status: AC
  Administered 2020-04-24: 20 mg via INTRAVENOUS

## 2020-04-24 MED ORDER — METHYLPREDNISOLONE SODIUM SUCC 125 MG IJ SOLR
INTRAMUSCULAR | Status: AC
  Filled 2020-04-24: qty 2

## 2020-04-24 MED ORDER — PROCHLORPERAZINE MALEATE 10 MG PO TABS
ORAL_TABLET | ORAL | Status: AC
  Filled 2020-04-24: qty 1

## 2020-04-24 MED ORDER — MONTELUKAST SODIUM 10 MG PO TABS
ORAL_TABLET | ORAL | Status: AC
  Filled 2020-04-24: qty 1

## 2020-04-24 NOTE — Patient Instructions (Signed)
[facility] Discharge Instructions for Patients Receiving Chemotherapy  Today you received the following chemotherapy agents: Kyprolis, Darzalex  To help prevent nausea and vomiting after your treatment, we encourage you to take your nausea medication as directed.   If you develop nausea and vomiting that is not controlled by your nausea medication, call the clinic.   BELOW ARE SYMPTOMS THAT SHOULD BE REPORTED IMMEDIATELY:  *FEVER GREATER THAN 100.5 F  *CHILLS WITH OR WITHOUT FEVER  NAUSEA AND VOMITING THAT IS NOT CONTROLLED WITH YOUR NAUSEA MEDICATION  *UNUSUAL SHORTNESS OF BREATH  *UNUSUAL BRUISING OR BLEEDING  TENDERNESS IN MOUTH AND THROAT WITH OR WITHOUT PRESENCE OF ULCERS  *URINARY PROBLEMS  *BOWEL PROBLEMS  UNUSUAL RASH Items with * indicate a potential emergency and should be followed up as soon as possible.  Feel free to call the clinic should you have any questions or concerns. The clinic phone number is [phone].  Please show the [place] at check-in to the Emergency Department and triage nurse.  Carfilzomib injection What is this medicine? CARFILZOMIB (kar FILZ oh mib) targets a specific protein within cancer cells and stops the cancer cells from growing. It is used to treat multiple myeloma. This medicine may be used for other purposes; ask your health care provider or pharmacist if you have questions. COMMON BRAND NAME(S): KYPROLIS What should I tell my health care provider before I take this medicine? They need to know if you have any of these conditions:  heart disease  history of blood clots  irregular heartbeat  kidney disease  liver disease  lung or breathing disease  an unusual or allergic reaction to carfilzomib, or other medicines, foods, dyes, or preservatives  pregnant or trying to get pregnant  breast-feeding How should I use this medicine? This medicine is for injection or infusion into a vein. It is  given by a health care professional in a hospital or clinic setting. Talk to your pediatrician regarding the use of this medicine in children. Special care may be needed. Overdosage: If you think you have taken too much of this medicine contact a poison control center or emergency room at once. NOTE: This medicine is only for you. Do not share this medicine with others. What if I miss a dose? It is important not to miss your dose. Call your doctor or health care professional if you are unable to keep an appointment. What may interact with this medicine? Interactions are not expected. Give your health care provider a list of all the medicines, herbs, non-prescription drugs, or dietary supplements you use. Also tell them if you smoke, drink alcohol, or use illegal drugs. Some items may interact with your medicine. This list may not describe all possible interactions. Give your health care provider a list of all the medicines, herbs, non-prescription drugs, or dietary supplements you use. Also tell them if you smoke, drink alcohol, or use illegal drugs. Some items may interact with your medicine. What should I watch for while using this medicine? Your condition will be monitored carefully while you are receiving this medicine. Report any side effects. Continue your course of treatment even though you feel ill unless your doctor tells you to stop. You may need blood work done while you are taking this medicine. Do not become pregnant while taking this medicine or for at least 6 months after stopping it. Women should inform their doctor if they wish to become pregnant or think they might be pregnant. There is  a potential for serious side effects to an unborn child. Men should not father a child while taking this medicine and for at least 3 months after stopping it. Talk to your health care professional or pharmacist for more information. Do not breast-feed an infant while taking this medicine or for 2 weeks  after the last dose. Check with your doctor or health care professional if you get an attack of severe diarrhea, nausea and vomiting, or if you sweat a lot. The loss of too much body fluid can make it dangerous for you to take this medicine. You may get dizzy. Do not drive, use machinery, or do anything that needs mental alertness until you know how this medicine affects you. Do not stand or sit up quickly, especially if you are an older patient. This reduces the risk of dizzy or fainting spells. What side effects may I notice from receiving this medicine? Side effects that you should report to your doctor or health care professional as soon as possible:  allergic reactions like skin rash, itching or hives, swelling of the face, lips, or tongue  confusion  dizziness  feeling faint or lightheaded  fever or chills  palpitations  seizures  signs and symptoms of bleeding such as bloody or black, tarry stools; red or dark-brown urine; spitting up blood or brown material that looks like coffee grounds; red spots on the skin; unusual bruising or bleeding including from the eye, gums, or nose  signs and symptoms of a blood clot such as breathing problems; changes in vision; chest pain; severe, sudden headache; pain, swelling, warmth in the leg; trouble speaking; sudden numbness or weakness of the face, arm or leg  signs and symptoms of kidney injury like trouble passing urine or change in the amount of urine  signs and symptoms of liver injury like dark yellow or brown urine; general ill feeling or flu-like symptoms; light-colored stools; loss of appetite; nausea; right upper belly pain; unusually weak or tired; yellowing of the eyes or skin Side effects that usually do not require medical attention (report to your doctor or health care professional if they continue or are bothersome):  back pain  cough  diarrhea  headache  muscle cramps  trouble sleeping  vomiting This list may not  describe all possible side effects. Call your doctor for medical advice about side effects. You may report side effects to FDA at 1-800-FDA-1088. Where should I keep my medicine? This drug is given in a hospital or clinic and will not be stored at home. NOTE: This sheet is a summary. It may not cover all possible information. If you have questions about this medicine, talk to your doctor, pharmacist, or health care provider.  2020 Elsevier/Gold Standard (2019-06-10 19:44:21)  Daratumumab injection What is this medicine? DARATUMUMAB (dar a toom ue mab) is a monoclonal antibody. It is used to treat multiple myeloma. This medicine may be used for other purposes; ask your health care provider or pharmacist if you have questions. COMMON BRAND NAME(S): DARZALEX What should I tell my health care provider before I take this medicine? They need to know if you have any of these conditions:  infection (especially a virus infection such as chickenpox, herpes, or hepatitis B virus)  lung or breathing disease  an unusual or allergic reaction to daratumumab, other medicines, foods, dyes, or preservatives  pregnant or trying to get pregnant  breast-feeding How should I use this medicine? This medicine is for infusion into a vein. It is  given by a health care professional in a hospital or clinic setting. Talk to your pediatrician regarding the use of this medicine in children. Special care may be needed. Overdosage: If you think you have taken too much of this medicine contact a poison control center or emergency room at once. NOTE: This medicine is only for you. Do not share this medicine with others. What if I miss a dose? Keep appointments for follow-up doses as directed. It is important not to miss your dose. Call your doctor or health care professional if you are unable to keep an appointment. What may interact with this medicine? Interactions have not been studied. This list may not describe  all possible interactions. Give your health care provider a list of all the medicines, herbs, non-prescription drugs, or dietary supplements you use. Also tell them if you smoke, drink alcohol, or use illegal drugs. Some items may interact with your medicine. What should I watch for while using this medicine? This drug may make you feel generally unwell. Report any side effects. Continue your course of treatment even though you feel ill unless your doctor tells you to stop. This medicine can cause serious allergic reactions. To reduce your risk you may need to take medicine before treatment with this medicine. Take your medicine as directed. This medicine can affect the results of blood tests to match your blood type. These changes can last for up to 6 months after the final dose. Your healthcare provider will do blood tests to match your blood type before you start treatment. Tell all of your healthcare providers that you are being treated with this medicine before receiving a blood transfusion. This medicine can affect the results of some tests used to determine treatment response; extra tests may be needed to evaluate response. Do not become pregnant while taking this medicine or for 3 months after stopping it. Women should inform their doctor if they wish to become pregnant or think they might be pregnant. There is a potential for serious side effects to an unborn child. Talk to your health care professional or pharmacist for more information. What side effects may I notice from receiving this medicine? Side effects that you should report to your doctor or health care professional as soon as possible:  allergic reactions like skin rash, itching or hives, swelling of the face, lips, or tongue  breathing problems  chills  cough  dizziness  feeling faint or lightheaded  headache  low blood counts - this medicine may decrease the number of white blood cells, red blood cells and platelets. You  may be at increased risk for infections and bleeding.  nausea, vomiting  shortness of breath  signs of decreased platelets or bleeding - bruising, pinpoint red spots on the skin, black, tarry stools, blood in the urine  signs of decreased red blood cells - unusually weak or tired, feeling faint or lightheaded, falls  signs of infection - fever or chills, cough, sore throat, pain or difficulty passing urine  signs and symptoms of liver injury like dark yellow or brown urine; general ill feeling or flu-like symptoms; light-colored stools; loss of appetite; right upper belly pain; unusually weak or tired; yellowing of the eyes or skin Side effects that usually do not require medical attention (report to your doctor or health care professional if they continue or are bothersome):  back pain  constipation  diarrhea  joint pain  muscle cramps  pain, tingling, numbness in the hands or feet  swelling  of the ankles, feet, hands  tiredness  trouble sleeping This list may not describe all possible side effects. Call your doctor for medical advice about side effects. You may report side effects to FDA at 1-800-FDA-1088. Where should I keep my medicine? This drug is given in a hospital or clinic and will not be stored at home. NOTE: This sheet is a summary. It may not cover all possible information. If you have questions about this medicine, talk to your doctor, pharmacist, or health care provider.  2020 Elsevier/Gold Standard (2019-06-11 18:10:54)

## 2020-04-24 NOTE — Progress Notes (Signed)
Patient stated that Dr. Irene Limbo is ware and monitoring the lump on the back of his head and the patient stated that his spouse feels like it is larger. Patient also stated that he has been having constant discomfort of his axilla area of his left arm radiating into his chest. He stated that the pain is worse when he lays down and he is having a hard time sleeping due to the pain. Made Georgina Pillion RN for Dr. Irene Limbo aware and she will follow up.

## 2020-04-24 NOTE — Progress Notes (Signed)
Dose of Kyprolis verified w/ Dr. Irene Limbo.  Since he is adding Daratumumab back into tx plan, he'd like Kyprolis dose to be 56 mg/m2 today  Kennith Center, Pharm.D., CPP 04/24/2020@8 :58 AM

## 2020-04-27 LAB — MULTIPLE MYELOMA PANEL, SERUM
Albumin SerPl Elph-Mcnc: 4 g/dL (ref 2.9–4.4)
Albumin/Glob SerPl: 1.5 (ref 0.7–1.7)
Alpha 1: 0.2 g/dL (ref 0.0–0.4)
Alpha2 Glob SerPl Elph-Mcnc: 0.7 g/dL (ref 0.4–1.0)
B-Globulin SerPl Elph-Mcnc: 0.9 g/dL (ref 0.7–1.3)
Gamma Glob SerPl Elph-Mcnc: 0.9 g/dL (ref 0.4–1.8)
Globulin, Total: 2.7 g/dL (ref 2.2–3.9)
IgA: 5 mg/dL — ABNORMAL LOW (ref 61–437)
IgG (Immunoglobin G), Serum: 938 mg/dL (ref 603–1613)
IgM (Immunoglobulin M), Srm: <5 mg/dL — ABNORMAL LOW (ref 20–172)
M Protein SerPl Elph-Mcnc: 0.7 g/dL — ABNORMAL HIGH
Total Protein ELP: 6.7 g/dL (ref 6.0–8.5)

## 2020-04-28 ENCOUNTER — Other Ambulatory Visit: Payer: Self-pay | Admitting: Hematology

## 2020-04-29 DIAGNOSIS — S76211A Strain of adductor muscle, fascia and tendon of right thigh, initial encounter: Secondary | ICD-10-CM | POA: Diagnosis not present

## 2020-04-29 DIAGNOSIS — M79622 Pain in left upper arm: Secondary | ICD-10-CM | POA: Diagnosis not present

## 2020-04-29 DIAGNOSIS — G4733 Obstructive sleep apnea (adult) (pediatric): Secondary | ICD-10-CM | POA: Diagnosis not present

## 2020-05-01 ENCOUNTER — Encounter: Payer: Self-pay | Admitting: Hematology

## 2020-05-01 ENCOUNTER — Telehealth: Payer: Self-pay

## 2020-05-01 DIAGNOSIS — M6281 Muscle weakness (generalized): Secondary | ICD-10-CM | POA: Diagnosis not present

## 2020-05-01 DIAGNOSIS — M7071 Other bursitis of hip, right hip: Secondary | ICD-10-CM | POA: Diagnosis not present

## 2020-05-01 DIAGNOSIS — S76011D Strain of muscle, fascia and tendon of right hip, subsequent encounter: Secondary | ICD-10-CM | POA: Diagnosis not present

## 2020-05-01 DIAGNOSIS — M25512 Pain in left shoulder: Secondary | ICD-10-CM | POA: Diagnosis not present

## 2020-05-01 DIAGNOSIS — R262 Difficulty in walking, not elsewhere classified: Secondary | ICD-10-CM | POA: Diagnosis not present

## 2020-05-01 NOTE — Telephone Encounter (Signed)
[name] patient informing him per Dr. Irene Limbo: the Medrol dose Michael [name] is not contraindicated. Patient verbalized understanding.

## 2020-05-05 DIAGNOSIS — M25651 Stiffness of right hip, not elsewhere classified: Secondary | ICD-10-CM | POA: Diagnosis not present

## 2020-05-05 DIAGNOSIS — M6281 Muscle weakness (generalized): Secondary | ICD-10-CM | POA: Diagnosis not present

## 2020-05-05 DIAGNOSIS — S76011D Strain of muscle, fascia and tendon of right hip, subsequent encounter: Secondary | ICD-10-CM | POA: Diagnosis not present

## 2020-05-05 DIAGNOSIS — M7071 Other bursitis of hip, right hip: Secondary | ICD-10-CM | POA: Diagnosis not present

## 2020-05-05 NOTE — Progress Notes (Signed)
HEMATOLOGY/ONCOLOGY CLINIC NOTE  Date of Service: 05/06/20     Patient Care Team: [name], MD as PCP - General (Family Medicine) [name], MD as Consulting Physician (Hematology) [name], MD as Attending Physician (Radiation Oncology) [name], RN (Inactive) as Oncology Nurse Navigator (Oncology) [name] MD ([facility] _ primary oncology) phone number [phone] Email- [email or web address]  CHIEF COMPLAINTS  followup of multiple myeloma.  HISTORY OF PRESENTING ILLNESS:   Michael [name] is a wonderful 69 y.o. 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 Patient is currently on a study protocol and is being monitored for myeloma recurrence.  Based on available records being put together  -Patient was diagnosed with IgG kappa ISS stage I multiple myeloma with hyperdiploid complex cytogenetics in 2012. Bone marrow biopsy apparently showed 50% kappa restricted plasma cells with an initial M spike of 3.4 g/dL with evidence of lytic lesions on his PET/CT scan including right rib fracture and lesions of the lumbar and cervical spine. -Patient was treated under protocol 11-C-0221 with from 02/21/2013 with  Carfilzomib/Revlimid/Dexamethasone for a total of 8 cycles. He reports that his stem cells were collected and stored after 4 cycles -Posttreatment bone marrow biopsy showed less than 5% plasma cells with a negative flow cytometry and improvement in his previously PET avid lesions. Some concern for new focus of activity at C2 lytic lesion at L4 and several small lesions throughout the vertebrae. -Patient was on maintenance lenalidomide 10  mg by mouth daily for 2 years or more until end of 2014. 08/19/2013 - bone marrow biopsy showed normocellular marrow with less than 5% plasma cells. PET CT scan showed decreased focal metabolic ligament prior rib lesions and no new lesions. Flow cytometry was negative for residual disease. Serum and urine electrophoresis and IFE showed no evidence of monoclonal protein. -Patient notes that he was off protocol for a few years due to lack of clinical trial research funding. -Patient notes that he is back on a follow-up protocol and continues to follow and receive his primary treatment at [facility] at this time. -Patient reports he had his last PET CT scan blood and urine tests and bone marrow examination in February 2018. The results of which are not available to [place] currently.  He notes that there was concern for a very slowly growing sternal FDG avid lesion that has been present for years. He has a follow-up at [facility] next month to determine the management of this lesion. He reports that was some consideration of considering radiation. he notes that this lesion has not been biopsied. No other focal bone pains at this time.  We discussed in detail about what our role will be and noted that we shall be available for any acute issues that need to be taken care of locally but that at this time the primary treatment and evaluation will be driven by his team at [facility] as per their research protocols and input. The patient and his wife are clearly aware of this and are in agreement with this plan.  INTERVAL HISTORY  Michael [name] is here for continued management of his Multiple Myeloma. He is here for C20D15 of maintenance Carfilzomib + Daratumumab. The  patient's last visit with [place] was on 04/08/2020. The pt reports that he is doing well overall.  The pt reports that he injured his groin after trying to avoid stepping on his cat and is experiencing discomfort in his left armpit. He has been seen by his  Orthopedist, who placed him on a Prednisone taper. Pt is receiving PT for bursitis in his right hip. He has an appointment with the Dermatologist tomorrow for a suspicious lesion on his head.   Pt denies any issues with his first recurrent cycle of Daratumumab. He had some minor fatigue, but nothing too bothersome.    Lab results today (05/06/20) of CBC w/diff and CMP is as follows: all values are WNL except for RBC at 4.12, CO2 at 21, Glucose at 211, AST at 14, Total Bilirubin at 1.3. 05/06/2020 Vitamin D 25 (OH) is 70.69  On review of systems, pt reports left shoulder discomfort and denies fatigue, bone pain, fevers, chills and any other symptoms.    MEDICAL HISTORY:  Past Medical History:  Diagnosis Date  . Angioedema   . CPAP (continuous positive airway pressure) dependence    uses at night  . Gastroesophageal reflux disease without esophagitis   . History of radiation therapy 06/04/18- 06/15/18   25 Gy in 10 fractions to the laryngeal/ cartilage lesion  . Hyperlipidemia   . Multiple myeloma ([place]) 2012   Treated at [facility]  . Non morbid obesity due to excess calories   . Prediabetes   . Prediabetes   . Unspecified glaucoma(365.9)   . Urticaria, unspecified   Diabetes mellitus Glaucoma Left toes neuropathy  GERD  SURGICAL HISTORY: Past Surgical History:  Procedure Laterality Date  . IR RADIOLOGIST EVAL & MGMT  11/13/2018  . PORTACATH PLACEMENT    . PORTACATH PLACEMENT     removed 02/2016    SOCIAL HISTORY: Social History   Socioeconomic History  . Marital status: Married    Spouse name: Not on file  . Number of children: 2  . Years of education: Not on file  . Highest education level: Not on file  Occupational History  . Occupation: Retired Chief Strategy Officer for [employer]  . Smoking status: Never Smoker  . Smokeless tobacco: Never Used  Vaping Use  . Vaping Use: Never used  Substance and Sexual Activity  . Alcohol use: Yes    Alcohol/week: 0.0 standard  drinks    Comment: Occasional beer  . Drug use: No  . Sexual activity: Not on file  Other Topics Concern  . Not on file  Social History Narrative   Unable to [employer] partner violence- partner in room    Social Determinants of Health   Financial Resource Strain:   . Difficulty of Paying Living Expenses:   Food Insecurity:   . Worried About Charity fundraiser in the Last Year:   . Arboriculturist in the Last Year:   Transportation Needs:   . Film/video editor (Medical):   [name] Lack of Transportation (Non-Medical):   Physical Activity:   . Days of Exercise per Week:   . Minutes of Exercise per Session:   Stress:   . Feeling of Stress :   Social Connections:   . Frequency of Communication with Friends and Family:   . Frequency of Social Gatherings with Friends and Family:   . Attends Religious Services:   . Active Member of Clubs or Organizations:   . Attends Archivist Meetings:   .  Marital Status:   Intimate Partner Violence:   . Fear of Current or Ex-Partner:   . Emotionally Abused:   [name] Physically Abused:   . Sexually Abused:   Never smoker Married Retired from the [employer] in June 2017 and moved to [facility].  FAMILY HISTORY: Family History  Problem Relation Age of Onset  . Cancer Father     ALLERGIES:  has No Known Allergies.  MEDICATIONS:  Current Outpatient Medications  Medication Sig Dispense Refill  . acyclovir (ZOVIRAX) 400 MG tablet Take 1 tablet (400 mg total) by mouth 2 (two) times daily. 60 tablet 0  . aspirin 81 MG tablet Take 81 mg by mouth daily.    [name] atorvastatin (LIPITOR) 20 MG tablet Take 20 mg by mouth every evening.     . Calcium Citrate-Vitamin D (CALCIUM CITRATE + D3 MAXIMUM PO) Take 630 mg by mouth.     . dexamethasone (DECADRON) 4 MG tablet 40m (5 tabs) with breakfast the day after each daratumumab treatment 20 tablet 4  . ergocalciferol (VITAMIN D2) 1.25 MG (50000 UT) capsule Take 1 capsule (50,000  Units total) by mouth 2 (two) times a week. 24 capsule 3  . glucose blood (FREESTYLE LITE) test strip     . Multiple Vitamin (MULTIVITAMIN) tablet Take 1 tablet by mouth daily.    . ondansetron (ZOFRAN) 8 MG tablet Take 1 tablet (8 mg total) by mouth 2 (two) times daily as needed (Nausea or vomiting). 30 tablet 1  . pantoprazole (PROTONIX) 40 MG tablet Take 40 mg by mouth daily.     . prochlorperazine (COMPAZINE) 10 MG tablet Take 1 tablet (10 mg total) by mouth every 6 (six) hours as needed (Nausea or vomiting). 30 tablet 1  . oxyCODONE (OXY IR/ROXICODONE) 5 MG immediate release tablet Take 1-2 tablets (5-10 mg total) by mouth every 4 (four) hours as needed for severe pain or breakthrough pain. (Patient not taking: Reported on 01/15/2020) 60 tablet 0  . senna-docusate (SENOKOT-S) 8.6-50 MG tablet Take 2 tablets by mouth at bedtime. Takes 50 mg (Patient not taking: Reported on 12/17/2019) 60 tablet 3   No current facility-administered medications for this visit.    REVIEW OF SYSTEMS:   A 10+ POINT REVIEW OF SYSTEMS WAS OBTAINED including neurology, dermatology, psychiatry, cardiac, respiratory, lymph, extremities, GI, GU, Musculoskeletal, constitutional, breasts, reproductive, HEENT.  All pertinent positives are noted in the HPI.  All others are negative.   PHYSICAL EXAMINATION: ECOG PERFORMANCE STATUS: 1 - Symptomatic but completely ambulatory  Vitals:   05/06/20 1011  BP: (!) 153/82  Pulse: 72  Resp: 18  Temp: 97.9 F (36.6 C)  TempSrc: Temporal  SpO2: 100%  Weight: 243 lb 8 oz (110.5 kg)  Height: 6' 1"  (1.854 m)   Body mass index is 32.13 kg/m.   Exam was given in a chair   GENERAL:alert, in no acute distress and comfortable SKIN: no acute rashes, no significant lesions EYES: conjunctiva are pink and non-injected, sclera anicteric OROPHARYNX: MMM, no exudates, no oropharyngeal erythema or ulceration NECK: supple, no JVD LYMPH:  no palpable lymphadenopathy in the cervical,  axillary or inguinal regions LUNGS: clear to auscultation b/l with normal respiratory effort HEART: regular rate & rhythm ABDOMEN:  normoactive bowel sounds , non tender, not distended. No palpable hepatosplenomegaly.  Extremity: no pedal edema PSYCH: alert & oriented x 3 with fluent speech NEURO: no focal motor/sensory deficits  LABORATORY DATA:  I have reviewed the data as listed . CBC Latest  Ref Rng & Units 05/06/2020 04/23/2020 04/08/2020  WBC 4.0 - 10.5 K/uL 5.5 5.1 4.2  Hemoglobin 13.0 - 17.0 g/dL 13.4 12.9(L) 12.6(L)  Hematocrit 39 - 52 % 40.0 38.6(L) 37.0(L)  Platelets 150 - 400 K/uL 275 249 235    . CMP Latest Ref Rng & Units 05/06/2020 04/23/2020 04/08/2020  Glucose 70 - 99 mg/dL 211(H) 105(H) 116(H)  BUN 8 - 23 mg/dL 15 14 10   Creatinine 0.61 - 1.24 mg/dL 0.86 0.83 0.87  Sodium 135 - 145 mmol/L 138 140 140  Potassium 3.5 - 5.1 mmol/L 3.9 3.8 3.7  Chloride 98 - 111 mmol/L 106 104 108  CO2 22 - 32 mmol/L 21(L) 26 23  Calcium 8.9 - 10.3 mg/dL 9.8 10.0 8.9  Total Protein 6.5 - 8.1 g/dL 7.1 7.0 6.6  Total Bilirubin 0.3 - 1.2 mg/dL 1.3(H) 1.4(H) 1.3(H)  Alkaline Phos 38 - 126 U/L 90 81 67  AST 15 - 41 U/L 14(L) 15 14(L)  ALT 0 - 44 U/L 16 21 18    08/16/2019 BM Bx Report ([id number]):   08/16/2019 FISH Panel:      05/08/18 Left Neck Thyroid Cartilage Biopsy:        RADIOGRAPHIC STUDIES: I have personally reviewed the radiological images as listed and agreed with the findings in the report. No results found.  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 Bone marrow biopsy apparently showed 50% kappa restricted plasma cells with an initial M spike of 3.4 g/dL with evidence of lytic lesions on his PET/CT scan including right rib fracture and lesions of the lumbar and cervical spine. -Patient was treated  under protocol 11-C-0221 with from 02/21/2013 with  Carfilzomib/Revlimid/Dexamethasone for a total of 8 cycles. He reports that his stem cells were collected and stored after 4 cycles -Posttreatment bone marrow biopsy showed less than 5% plasma cells with a negative flow cytometry and improvement in his previously PET avid lesions. Some concern for new focus of activity at C2 lytic lesion at L4 and several small lesions throughout the vertebrae. -Patient was on maintenance lenalidomide 10 mg by mouth daily for 2 years or more until end of 2014. 08/19/2013 - bone marrow biopsy showed normocellular marrow with less than 5% plasma cells. PET CT scan showed decreased focal metabolic ligament prior rib lesions and no new lesions. Flow cytometry was negative for residual disease. Serum and urine electrophoresis and IFE showed no evidence of monoclonal protein. -Patient notes that he was off protocol for a few years due to lack of clinical trial research funding. -Patient returned to follow-up protocol and began to follow and receive his primary treatment at [facility]  -Patient's labs show minimal IgG kappa protein on IFE and a CT chest in May 2018 that did not show any overt signs of myeloma progression in the bones.  Rpt Myeloma labs 03/2017 - M spike of 0.3g/dl with a repeat M spike stable at 0.3 g/dL on 05/23/2017 which remains stable on labs today 07/25/17 Serum kappa lambda free light chain ratio not significantly changed. PET/CT scan showed a single metabolically active sternal lesion without any overt bony destruction. Bone marrow biopsy done on 06/06/2017 - shows no overt involvement with multiple myeloma.  04/24/18 CT Soft Tissue Neck revealed Left laryngeal 3 cm soft tissue mass appears to have originated within and is expanding the left  thyroid cartilage. This is new since the August 2018 PET-CT, and there is absent lymphadenopathy. Consider Multiple Myeloma of the laryngeal cartilage in this clinical  setting. Primary cartilaginous neoplasm, squamous cell carcinoma, and other differential considerations are less likely. Superimposed widespread multiple myeloma lesions in the visible skeleton.  05/08/18 Left neck thyroid cartilage biopsy revealed Plasma cell neoplasm.  05/22/18 PET/CT revealed Soft tissue mass centered around the destroyed left thyroid cartilage is hypermetabolic and consistent with known plasmacytoma. No adenopathy in the neck. 2. Hypermetabolic 3 cm cutaneous and subcutaneous soft tissue mass involving the posterior upper thorax suspicious for cutaneous manifestation of myeloma (plasmacytoma). 3. Persistent and slightly progressive hypermetabolic sternal lesion. 4. Diffuse lytic myelomatous lesions throughout the bony structures but no other hypermetabolic foci. 5. Focus of hypermetabolism in the left aspect of the lower prostate gland, not present on the prior study. This could be an area of infection/inflammation or potential prostate cancer. Recommend correlation with physical examination and PSA level.    Completed RT between 06/04/18 and 06/15/18 of 25 Gy by 10 fractions  The pt pursued a clinical trial at the [facility] with RT and immunotherapy in October 2019 through October 18, 2018 with further information available at [email or web address]  10/18/18 M Protein increased to 3.5g, from 0.7g at our last visit on 07/10/18. 10/02/18 PET/CT from [facility] indicated new hypermetabolism in left clavicle, left scapula, bilateral ribs, spine, and left proximal femur 10/18/18 MRI from [facility] ndicated a pathologic fracture at T12  10/2018 M Protein at 4.1g  11/23/18 NM Bone revealed Patchy uptake throughout the ribs and spine with areas of focally increased activity in both T12 pedicles and the right L1 pedicle. This focal activity could be stress related without clear corresponding fracture on available prior studies.  08/16/2019 FISH Panel which revealed "No mutations  detected."  08/16/2019 BM Bx Surgical Pathology [phone]) which revealed  "DIAGNOSIS: BONE MARROW, ASPIRATE, CLOT, CORE: -Hypercellular bone marrow for age with trilineage hematopoiesis and less than 1% plasma cells PERIPHERAL BLOOD: -Macrocytic anemia -Leukocytosis ".  09/03/2019 PET/CT scan ([id number]) revealed  "1. No evidence of active multiple myeloma on whole-body FDG PET scan. 2. Stable lytic lesions within the spine and sternum consistent treated metastasis. 3. No soft tissue plasmacytoma. 4. New compression fracture at T12 compared to PET-CT scan 10/02/2018."  2) h/o Elevated bilirubin level . ? [place]  3) PET/CT abnormality in the Prostate. PSA levels WNL -Primary care physician to consider urology referral  4) Abnormal focus of FDG avid at a spot in the liver - will need to be monitor on f/u scans. No pain or other symptoms at this time.  PLAN:  -Discussed pt labwork today, 05/06/20; blood counts and chemistries are steady, Glucose is slightly elevated, Vitamin D is 70 -Discussed 04/23/2020 Vitamin D 25 (OH) is 84.73, MMP shows M Protein at 0.7 g/dL -The pt has no prohibitive toxicities from continuing C20D15 of Carfilzomib + Daratumumab at this time. -Discussed adding in D8 Carfilzomib for more aggressive treatment. Pt agrees with this option. Will begin with next cycle.  -Advised pt that he may not have the best response due to being on treatment, but would still recommend he receive the Shingrix vaccine.  -Will refer pt back to [facility] is ready to have a discussion with the transplant team.  -Recommend pt f/u with Dermatology as scheduled -Continue twice weekly 50K UT Vitamin D  -Will see back in 6 weeks with labs   FOLLOW UP: Plz schedule Next 2 cycles  of Carfilzomib/Daratumumab/Dexamethasone (D1,D8 and D15) Labs on each day of treatment MD visit in 6 weeks    The total time spent in the appt was 30 minutes and more than 50% was on counseling and direct  patient cares, ordering and mx of chemotherapy  All of the patient's questions were answered with apparent satisfaction. The patient knows to call the clinic with any problems, questions or concerns.    Sullivan Lone MD [place] AAHIVMS [facility] Hematology/Oncology Physician [facility]  (Office):       [phone] (Work cell):  [phone] (Fax):           [phone]  I, Yevette Edwards, am acting as a scribe for Dr. Sullivan Lone.   .I have reviewed the above documentation for accuracy and completeness, and I agree with the above. [name] MD

## 2020-05-06 ENCOUNTER — Other Ambulatory Visit: Payer: Medicare Other

## 2020-05-06 ENCOUNTER — Inpatient Hospital Stay: Payer: Medicare Other

## 2020-05-06 ENCOUNTER — Inpatient Hospital Stay (HOSPITAL_BASED_OUTPATIENT_CLINIC_OR_DEPARTMENT_OTHER): Payer: Medicare Other | Admitting: Hematology

## 2020-05-06 ENCOUNTER — Other Ambulatory Visit: Payer: Self-pay

## 2020-05-06 ENCOUNTER — Ambulatory Visit: Payer: Medicare Other

## 2020-05-06 VITALS — BP 153/82 | HR 72 | Temp 97.9°F | Resp 18 | Ht 73.0 in | Wt 243.5 lb

## 2020-05-06 VITALS — BP 140/78 | HR 68 | Temp 97.9°F | Resp 17

## 2020-05-06 DIAGNOSIS — C9000 Multiple myeloma not having achieved remission: Secondary | ICD-10-CM

## 2020-05-06 DIAGNOSIS — Z5112 Encounter for antineoplastic immunotherapy: Secondary | ICD-10-CM

## 2020-05-06 DIAGNOSIS — Z7189 Other specified counseling: Secondary | ICD-10-CM

## 2020-05-06 DIAGNOSIS — G893 Neoplasm related pain (acute) (chronic): Secondary | ICD-10-CM | POA: Diagnosis not present

## 2020-05-06 DIAGNOSIS — Z79899 Other long term (current) drug therapy: Secondary | ICD-10-CM | POA: Diagnosis not present

## 2020-05-06 DIAGNOSIS — E559 Vitamin D deficiency, unspecified: Secondary | ICD-10-CM

## 2020-05-06 LAB — CBC WITH DIFFERENTIAL/PLATELET
Abs Immature Granulocytes: 0.03 10*3/uL (ref 0.00–0.07)
Basophils Absolute: 0.1 10*3/uL (ref 0.0–0.1)
Basophils Relative: 1 %
Eosinophils Absolute: 0.2 10*3/uL (ref 0.0–0.5)
Eosinophils Relative: 4 %
HCT: 40 % (ref 39.0–52.0)
Hemoglobin: 13.4 g/dL (ref 13.0–17.0)
Immature Granulocytes: 1 %
Lymphocytes Relative: 12 %
Lymphs Abs: 0.7 10*3/uL (ref 0.7–4.0)
MCH: 32.5 pg (ref 26.0–34.0)
MCHC: 33.5 g/dL (ref 30.0–36.0)
MCV: 97.1 fL (ref 80.0–100.0)
Monocytes Absolute: 0.4 10*3/uL (ref 0.1–1.0)
Monocytes Relative: 7 %
Neutro Abs: 4.2 10*3/uL (ref 1.7–7.7)
Neutrophils Relative %: 75 %
Platelets: 275 10*3/uL (ref 150–400)
RBC: 4.12 MIL/uL — ABNORMAL LOW (ref 4.22–5.81)
RDW: 12.9 % (ref 11.5–15.5)
WBC: 5.5 10*3/uL (ref 4.0–10.5)
nRBC: 0 % (ref 0.0–0.2)

## 2020-05-06 LAB — CMP (CANCER CENTER ONLY)
ALT: 16 U/L (ref 0–44)
AST: 14 U/L — ABNORMAL LOW (ref 15–41)
Albumin: 3.9 g/dL (ref 3.5–5.0)
Alkaline Phosphatase: 90 U/L (ref 38–126)
Anion gap: 11 (ref 5–15)
BUN: 15 mg/dL (ref 8–23)
CO2: 21 mmol/L — ABNORMAL LOW (ref 22–32)
Calcium: 9.8 mg/dL (ref 8.9–10.3)
Chloride: 106 mmol/L (ref 98–111)
Creatinine: 0.86 mg/dL (ref 0.61–1.24)
GFR, Est AFR Am: >60 mL/min (ref >60)
GFR, Estimated: >60 mL/min (ref >60)
Glucose, Bld: 211 mg/dL — ABNORMAL HIGH (ref 70–99)
Potassium: 3.9 mmol/L (ref 3.5–5.1)
Sodium: 138 mmol/L (ref 135–145)
Total Bilirubin: 1.3 mg/dL — ABNORMAL HIGH (ref 0.3–1.2)
Total Protein: 7.1 g/dL (ref 6.5–8.1)

## 2020-05-06 LAB — VITAMIN D 25 HYDROXY (VIT D DEFICIENCY, FRACTURES): Vit D, 25-Hydroxy: 70.69 ng/mL (ref 30–100)

## 2020-05-06 MED ORDER — ACETAMINOPHEN 325 MG PO TABS
ORAL_TABLET | ORAL | Status: AC
  Filled 2020-05-06: qty 2

## 2020-05-06 MED ORDER — DEXTROSE 5 % IV SOLN
55.0000 mg/m2 | Freq: Once | INTRAVENOUS | Status: AC
  Administered 2020-05-06: 130 mg via INTRAVENOUS
  Filled 2020-05-06: qty 60

## 2020-05-06 MED ORDER — ACETAMINOPHEN 325 MG PO TABS
650.0000 mg | ORAL_TABLET | Freq: Once | ORAL | Status: AC
  Administered 2020-05-06: 650 mg via ORAL

## 2020-05-06 MED ORDER — METHYLPREDNISOLONE SODIUM SUCC 125 MG IJ SOLR
INTRAMUSCULAR | Status: AC
  Filled 2020-05-06: qty 2

## 2020-05-06 MED ORDER — FAMOTIDINE 20 MG PO TABS
ORAL_TABLET | ORAL | Status: AC
  Filled 2020-05-06: qty 1

## 2020-05-06 MED ORDER — FAMOTIDINE IN NACL 20-0.9 MG/50ML-% IV SOLN
INTRAVENOUS | Status: AC
  Filled 2020-05-06: qty 50

## 2020-05-06 MED ORDER — SODIUM CHLORIDE 0.9 % IV SOLN
15.6000 mg/kg | Freq: Once | INTRAVENOUS | Status: AC
  Administered 2020-05-06: 1700 mg via INTRAVENOUS
  Filled 2020-05-06: qty 80

## 2020-05-06 MED ORDER — METHYLPREDNISOLONE SODIUM SUCC 125 MG IJ SOLR
100.0000 mg | Freq: Once | INTRAMUSCULAR | Status: AC
  Administered 2020-05-06: 100 mg via INTRAVENOUS

## 2020-05-06 MED ORDER — MONTELUKAST SODIUM 10 MG PO TABS
10.0000 mg | ORAL_TABLET | Freq: Once | ORAL | Status: AC
  Administered 2020-05-06: 10 mg via ORAL

## 2020-05-06 MED ORDER — MONTELUKAST SODIUM 10 MG PO TABS
ORAL_TABLET | ORAL | Status: AC
  Filled 2020-05-06: qty 1

## 2020-05-06 MED ORDER — DIPHENHYDRAMINE HCL 25 MG PO CAPS
50.0000 mg | ORAL_CAPSULE | Freq: Once | ORAL | Status: AC
  Administered 2020-05-06: 50 mg via ORAL

## 2020-05-06 MED ORDER — PROCHLORPERAZINE MALEATE 10 MG PO TABS
10.0000 mg | ORAL_TABLET | Freq: Once | ORAL | Status: AC
  Administered 2020-05-06: 10 mg via ORAL

## 2020-05-06 MED ORDER — FAMOTIDINE IN NACL 20-0.9 MG/50ML-% IV SOLN
20.0000 mg | Freq: Once | INTRAVENOUS | Status: AC
  Administered 2020-05-06: 20 mg via INTRAVENOUS

## 2020-05-06 MED ORDER — DIPHENHYDRAMINE HCL 25 MG PO CAPS
ORAL_CAPSULE | ORAL | Status: AC
  Filled 2020-05-06: qty 2

## 2020-05-06 MED ORDER — SODIUM CHLORIDE 0.9 % IV SOLN
Freq: Once | INTRAVENOUS | Status: AC
  Filled 2020-05-06: qty 250

## 2020-05-06 MED ORDER — PROCHLORPERAZINE MALEATE 10 MG PO TABS
ORAL_TABLET | ORAL | Status: AC
  Filled 2020-05-06: qty 1

## 2020-05-06 NOTE — Patient Instructions (Signed)
[facility] Discharge Instructions for Patients Receiving Chemotherapy  Today you received the following chemotherapy agents: Kyprolis, Darzalex  To help prevent nausea and vomiting after your treatment, we encourage you to take your nausea medication as directed.   If you develop nausea and vomiting that is not controlled by your nausea medication, call the clinic.   BELOW ARE SYMPTOMS THAT SHOULD BE REPORTED IMMEDIATELY:  *FEVER GREATER THAN 100.5 F  *CHILLS WITH OR WITHOUT FEVER  NAUSEA AND VOMITING THAT IS NOT CONTROLLED WITH YOUR NAUSEA MEDICATION  *UNUSUAL SHORTNESS OF BREATH  *UNUSUAL BRUISING OR BLEEDING  TENDERNESS IN MOUTH AND THROAT WITH OR WITHOUT PRESENCE OF ULCERS  *URINARY PROBLEMS  *BOWEL PROBLEMS  UNUSUAL RASH Items with * indicate a potential emergency and should be followed up as soon as possible.  Feel free to call the clinic should you have any questions or concerns. The clinic phone number is [phone].  Please show the CHEMO ALERT CARD at check-in to the Emergency Department and triage nurse.   

## 2020-05-07 ENCOUNTER — Telehealth: Payer: Self-pay | Admitting: *Deleted

## 2020-05-07 DIAGNOSIS — D234 Other benign neoplasm of skin of scalp and neck: Secondary | ICD-10-CM | POA: Diagnosis not present

## 2020-05-07 DIAGNOSIS — L570 Actinic keratosis: Secondary | ICD-10-CM | POA: Diagnosis not present

## 2020-05-07 NOTE — Telephone Encounter (Signed)
Dr. Kale referred patient to [facility] for consideration of transplant. Diagnosis: Multiple Myeloma Contacted  [facility] Heme/Onc referral coordinator.   Referral coordinator requested the following information faxed to them:  demographics and last 6 months cancer center records (including labs, office visits, and treatment).  Fax # [phone]  Message sent to medical records with request to fax records to number given.  

## 2020-05-08 ENCOUNTER — Telehealth: Payer: Self-pay | Admitting: Hematology

## 2020-05-08 DIAGNOSIS — M6281 Muscle weakness (generalized): Secondary | ICD-10-CM | POA: Diagnosis not present

## 2020-05-08 DIAGNOSIS — R262 Difficulty in walking, not elsewhere classified: Secondary | ICD-10-CM | POA: Diagnosis not present

## 2020-05-08 DIAGNOSIS — M25651 Stiffness of right hip, not elsewhere classified: Secondary | ICD-10-CM | POA: Diagnosis not present

## 2020-05-08 DIAGNOSIS — S76011D Strain of muscle, fascia and tendon of right hip, subsequent encounter: Secondary | ICD-10-CM | POA: Diagnosis not present

## 2020-05-08 DIAGNOSIS — L989 Disorder of the skin and subcutaneous tissue, unspecified: Secondary | ICD-10-CM | POA: Diagnosis not present

## 2020-05-08 DIAGNOSIS — Z6831 Body mass index (BMI) 31.0-31.9, adult: Secondary | ICD-10-CM | POA: Diagnosis not present

## 2020-05-08 NOTE — Telephone Encounter (Signed)
Scheduled per 07/21 los, patient has been called and notified of upcoming appointments. 

## 2020-05-11 ENCOUNTER — Other Ambulatory Visit: Payer: Self-pay | Admitting: Hematology

## 2020-05-11 DIAGNOSIS — S76011D Strain of muscle, fascia and tendon of right hip, subsequent encounter: Secondary | ICD-10-CM | POA: Diagnosis not present

## 2020-05-11 DIAGNOSIS — M6281 Muscle weakness (generalized): Secondary | ICD-10-CM | POA: Diagnosis not present

## 2020-05-11 DIAGNOSIS — M7071 Other bursitis of hip, right hip: Secondary | ICD-10-CM | POA: Diagnosis not present

## 2020-05-11 DIAGNOSIS — R262 Difficulty in walking, not elsewhere classified: Secondary | ICD-10-CM | POA: Diagnosis not present

## 2020-05-11 DIAGNOSIS — C9000 Multiple myeloma not having achieved remission: Secondary | ICD-10-CM

## 2020-05-12 ENCOUNTER — Encounter: Payer: Self-pay | Admitting: Hematology

## 2020-05-13 ENCOUNTER — Other Ambulatory Visit: Payer: Self-pay | Admitting: Hematology

## 2020-05-13 DIAGNOSIS — C9000 Multiple myeloma not having achieved remission: Secondary | ICD-10-CM

## 2020-05-13 DIAGNOSIS — Z7189 Other specified counseling: Secondary | ICD-10-CM

## 2020-05-13 MED ORDER — ACYCLOVIR 400 MG PO TABS: 400.0000 mg | ORAL_TABLET | Freq: Two times a day (BID) | ORAL | 0 refills | Status: DC

## 2020-05-13 NOTE — Telephone Encounter (Signed)
Please review for refill.  

## 2020-05-15 DIAGNOSIS — C9000 Multiple myeloma not having achieved remission: Secondary | ICD-10-CM | POA: Diagnosis not present

## 2020-05-15 DIAGNOSIS — C9030 Solitary plasmacytoma not having achieved remission: Secondary | ICD-10-CM | POA: Diagnosis not present

## 2020-05-15 DIAGNOSIS — D759 Disease of blood and blood-forming organs, unspecified: Secondary | ICD-10-CM | POA: Diagnosis not present

## 2020-05-19 ENCOUNTER — Other Ambulatory Visit: Payer: Self-pay | Admitting: Hematology

## 2020-05-19 DIAGNOSIS — M25651 Stiffness of right hip, not elsewhere classified: Secondary | ICD-10-CM | POA: Diagnosis not present

## 2020-05-19 DIAGNOSIS — R262 Difficulty in walking, not elsewhere classified: Secondary | ICD-10-CM | POA: Diagnosis not present

## 2020-05-19 DIAGNOSIS — M6281 Muscle weakness (generalized): Secondary | ICD-10-CM | POA: Diagnosis not present

## 2020-05-19 DIAGNOSIS — C9000 Multiple myeloma not having achieved remission: Secondary | ICD-10-CM

## 2020-05-19 DIAGNOSIS — S76011D Strain of muscle, fascia and tendon of right hip, subsequent encounter: Secondary | ICD-10-CM | POA: Diagnosis not present

## 2020-05-19 NOTE — Progress Notes (Signed)
Increasing bone pains over chest wall  PET/CT ordered for evaluation to evaluate for progression of myeloma. If neg - will need to orthopedics for further evaluation of MSK etiology of pain.  Michael [name]

## 2020-05-20 ENCOUNTER — Inpatient Hospital Stay: Payer: Medicare Other

## 2020-05-20 ENCOUNTER — Other Ambulatory Visit: Payer: Self-pay | Admitting: Medical

## 2020-05-20 ENCOUNTER — Other Ambulatory Visit: Payer: Self-pay | Admitting: Hematology

## 2020-05-20 ENCOUNTER — Ambulatory Visit: Payer: Medicare Other

## 2020-05-20 ENCOUNTER — Inpatient Hospital Stay: Payer: Medicare Other | Attending: Hematology

## 2020-05-20 ENCOUNTER — Other Ambulatory Visit: Payer: Self-pay

## 2020-05-20 ENCOUNTER — Other Ambulatory Visit: Payer: Medicare Other

## 2020-05-20 ENCOUNTER — Inpatient Hospital Stay (HOSPITAL_BASED_OUTPATIENT_CLINIC_OR_DEPARTMENT_OTHER): Payer: Medicare Other | Admitting: Medical

## 2020-05-20 VITALS — BP 143/64 | HR 78 | Temp 98.4°F | Resp 18 | Wt 247.8 lb

## 2020-05-20 DIAGNOSIS — C9000 Multiple myeloma not having achieved remission: Secondary | ICD-10-CM | POA: Diagnosis not present

## 2020-05-20 DIAGNOSIS — Z5112 Encounter for antineoplastic immunotherapy: Secondary | ICD-10-CM | POA: Diagnosis not present

## 2020-05-20 DIAGNOSIS — G629 Polyneuropathy, unspecified: Secondary | ICD-10-CM

## 2020-05-20 DIAGNOSIS — Z23 Encounter for immunization: Secondary | ICD-10-CM | POA: Insufficient documentation

## 2020-05-20 DIAGNOSIS — Z7189 Other specified counseling: Secondary | ICD-10-CM

## 2020-05-20 DIAGNOSIS — E559 Vitamin D deficiency, unspecified: Secondary | ICD-10-CM

## 2020-05-20 LAB — CBC WITH DIFFERENTIAL/PLATELET
Abs Immature Granulocytes: 0.02 10*3/uL (ref 0.00–0.07)
Basophils Absolute: 0.1 10*3/uL (ref 0.0–0.1)
Basophils Relative: 1 %
Eosinophils Absolute: 0.3 10*3/uL (ref 0.0–0.5)
Eosinophils Relative: 7 %
HCT: 39.3 % (ref 39.0–52.0)
Hemoglobin: 13.1 g/dL (ref 13.0–17.0)
Immature Granulocytes: 1 %
Lymphocytes Relative: 14 %
Lymphs Abs: 0.6 10*3/uL — ABNORMAL LOW (ref 0.7–4.0)
MCH: 32.4 pg (ref 26.0–34.0)
MCHC: 33.3 g/dL (ref 30.0–36.0)
MCV: 97.3 fL (ref 80.0–100.0)
Monocytes Absolute: 0.5 10*3/uL (ref 0.1–1.0)
Monocytes Relative: 11 %
Neutro Abs: 2.8 10*3/uL (ref 1.7–7.7)
Neutrophils Relative %: 66 %
Platelets: 238 10*3/uL (ref 150–400)
RBC: 4.04 MIL/uL — ABNORMAL LOW (ref 4.22–5.81)
RDW: 13.1 % (ref 11.5–15.5)
WBC: 4.2 10*3/uL (ref 4.0–10.5)
nRBC: 0 % (ref 0.0–0.2)

## 2020-05-20 LAB — CMP (CANCER CENTER ONLY)
ALT: 15 U/L (ref 0–44)
AST: 13 U/L — ABNORMAL LOW (ref 15–41)
Albumin: 4 g/dL (ref 3.5–5.0)
Alkaline Phosphatase: 82 U/L (ref 38–126)
Anion gap: 6 (ref 5–15)
BUN: 12 mg/dL (ref 8–23)
CO2: 28 mmol/L (ref 22–32)
Calcium: 10.3 mg/dL (ref 8.9–10.3)
Chloride: 104 mmol/L (ref 98–111)
Creatinine: 0.93 mg/dL (ref 0.61–1.24)
GFR, Est AFR Am: >60 mL/min (ref >60)
GFR, Estimated: >60 mL/min (ref >60)
Glucose, Bld: 113 mg/dL — ABNORMAL HIGH (ref 70–99)
Potassium: 4.3 mmol/L (ref 3.5–5.1)
Sodium: 138 mmol/L (ref 135–145)
Total Bilirubin: 1.6 mg/dL — ABNORMAL HIGH (ref 0.3–1.2)
Total Protein: 7.4 g/dL (ref 6.5–8.1)

## 2020-05-20 LAB — VITAMIN D 25 HYDROXY (VIT D DEFICIENCY, FRACTURES): Vit D, 25-Hydroxy: 82.14 ng/mL (ref 30–100)

## 2020-05-20 MED ORDER — ACETAMINOPHEN 325 MG PO TABS
ORAL_TABLET | ORAL | Status: AC
  Filled 2020-05-20: qty 2

## 2020-05-20 MED ORDER — FAMOTIDINE IN NACL 20-0.9 MG/50ML-% IV SOLN
INTRAVENOUS | Status: AC
  Filled 2020-05-20: qty 50

## 2020-05-20 MED ORDER — SODIUM CHLORIDE 0.9 % IV SOLN
Freq: Once | INTRAVENOUS | Status: AC
  Filled 2020-05-20: qty 250

## 2020-05-20 MED ORDER — DIPHENHYDRAMINE HCL 25 MG PO CAPS
ORAL_CAPSULE | ORAL | Status: AC
  Filled 2020-05-20: qty 2

## 2020-05-20 MED ORDER — MONTELUKAST SODIUM 10 MG PO TABS
ORAL_TABLET | ORAL | Status: AC
  Filled 2020-05-20: qty 1

## 2020-05-20 MED ORDER — MONTELUKAST SODIUM 10 MG PO TABS
10.0000 mg | ORAL_TABLET | Freq: Once | ORAL | Status: AC
  Administered 2020-05-20: 10 mg via ORAL

## 2020-05-20 MED ORDER — PROCHLORPERAZINE MALEATE 10 MG PO TABS
ORAL_TABLET | ORAL | Status: AC
  Filled 2020-05-20: qty 1

## 2020-05-20 MED ORDER — DEXTROSE 5 % IV SOLN
55.0000 mg/m2 | Freq: Once | INTRAVENOUS | Status: AC
  Administered 2020-05-20: 130 mg via INTRAVENOUS
  Filled 2020-05-20: qty 5

## 2020-05-20 MED ORDER — METHYLPREDNISOLONE SODIUM SUCC 125 MG IJ SOLR
INTRAMUSCULAR | Status: AC
  Filled 2020-05-20: qty 2

## 2020-05-20 MED ORDER — PROCHLORPERAZINE MALEATE 10 MG PO TABS
10.0000 mg | ORAL_TABLET | Freq: Once | ORAL | Status: AC
  Administered 2020-05-20: 10 mg via ORAL

## 2020-05-20 MED ORDER — FAMOTIDINE IN NACL 20-0.9 MG/50ML-% IV SOLN
20.0000 mg | Freq: Once | INTRAVENOUS | Status: AC
  Administered 2020-05-20: 20 mg via INTRAVENOUS

## 2020-05-20 MED ORDER — SODIUM CHLORIDE 0.9 % IV SOLN
15.6000 mg/kg | Freq: Once | INTRAVENOUS | Status: AC
  Administered 2020-05-20: 1700 mg via INTRAVENOUS
  Filled 2020-05-20: qty 5

## 2020-05-20 MED ORDER — GABAPENTIN 300 MG PO CAPS: 300.0000 mg | ORAL_CAPSULE | Freq: Every day | ORAL | 5 refills | Status: DC

## 2020-05-20 MED ORDER — METHYLPREDNISOLONE SODIUM SUCC 125 MG IJ SOLR
100.0000 mg | Freq: Once | INTRAMUSCULAR | Status: AC
  Administered 2020-05-20: 100 mg via INTRAVENOUS

## 2020-05-20 MED ORDER — DIPHENHYDRAMINE HCL 25 MG PO CAPS
50.0000 mg | ORAL_CAPSULE | Freq: Once | ORAL | Status: AC
  Administered 2020-05-20: 50 mg via ORAL

## 2020-05-20 MED ORDER — ACETAMINOPHEN 325 MG PO TABS
650.0000 mg | ORAL_TABLET | Freq: Once | ORAL | Status: AC
  Administered 2020-05-20: 650 mg via ORAL

## 2020-05-20 NOTE — Progress Notes (Signed)
Patient with complaints of L sided pain that starts in armpit and radiates to chest. Patient states that he has discussed this pain with Dr. Irene Limbo and his PCP. Patient has a scan scheduled early next week. Sandi Mealy, PA asked to come to infusion room to evaluate patient.

## 2020-05-20 NOTE — Patient Instructions (Signed)
[facility] Discharge Instructions for Patients Receiving Chemotherapy  Today you received the following chemotherapy agents: Kyprolis, Darzalex  To help prevent nausea and vomiting after your treatment, we encourage you to take your nausea medication as directed.   If you develop nausea and vomiting that is not controlled by your nausea medication, call the clinic.   BELOW ARE SYMPTOMS THAT SHOULD BE REPORTED IMMEDIATELY:  *FEVER GREATER THAN 100.5 F  *CHILLS WITH OR WITHOUT FEVER  NAUSEA AND VOMITING THAT IS NOT CONTROLLED WITH YOUR NAUSEA MEDICATION  *UNUSUAL SHORTNESS OF BREATH  *UNUSUAL BRUISING OR BLEEDING  TENDERNESS IN MOUTH AND THROAT WITH OR WITHOUT PRESENCE OF ULCERS  *URINARY PROBLEMS  *BOWEL PROBLEMS  UNUSUAL RASH Items with * indicate a potential emergency and should be followed up as soon as possible.  Feel free to call the clinic should you have any questions or concerns. The clinic phone number is [phone].  Please show the CHEMO ALERT CARD at check-in to the Emergency Department and triage nurse.   

## 2020-05-21 NOTE — Progress Notes (Signed)
The patient was seen in the infusion room today as he was receiving treatment.  He reported having left-sided arm pain and burning across his chest.  He was recently begun on acyclovir by his primary care provider.  He initially question did he have shingles.  His exam today showed no evidence of vesicles or healing wounds.  He was placed on Neurontin 300 mg p.o. nightly.  Sandi Mealy, MHS, PA-C Physician Assistant

## 2020-05-22 DIAGNOSIS — M25651 Stiffness of right hip, not elsewhere classified: Secondary | ICD-10-CM | POA: Diagnosis not present

## 2020-05-22 DIAGNOSIS — R0789 Other chest pain: Secondary | ICD-10-CM | POA: Diagnosis not present

## 2020-05-22 DIAGNOSIS — R262 Difficulty in walking, not elsewhere classified: Secondary | ICD-10-CM | POA: Diagnosis not present

## 2020-05-22 DIAGNOSIS — M6281 Muscle weakness (generalized): Secondary | ICD-10-CM | POA: Diagnosis not present

## 2020-05-22 DIAGNOSIS — Z6832 Body mass index (BMI) 32.0-32.9, adult: Secondary | ICD-10-CM | POA: Diagnosis not present

## 2020-05-22 DIAGNOSIS — M7071 Other bursitis of hip, right hip: Secondary | ICD-10-CM | POA: Diagnosis not present

## 2020-05-22 LAB — MULTIPLE MYELOMA PANEL, SERUM
Albumin SerPl Elph-Mcnc: 3.6 g/dL (ref 2.9–4.4)
Albumin/Glob SerPl: 1.2 (ref 0.7–1.7)
Alpha 1: 0.2 g/dL (ref 0.0–0.4)
Alpha2 Glob SerPl Elph-Mcnc: 0.7 g/dL (ref 0.4–1.0)
B-Globulin SerPl Elph-Mcnc: 1 g/dL (ref 0.7–1.3)
Gamma Glob SerPl Elph-Mcnc: 1.2 g/dL (ref 0.4–1.8)
Globulin, Total: 3.1 g/dL (ref 2.2–3.9)
IgA: <5 mg/dL — ABNORMAL LOW (ref 61–437)
IgG (Immunoglobin G), Serum: 1321 mg/dL (ref 603–1613)
IgM (Immunoglobulin M), Srm: <5 mg/dL — ABNORMAL LOW (ref 20–172)
M Protein SerPl Elph-Mcnc: 1 g/dL — ABNORMAL HIGH
Total Protein ELP: 6.7 g/dL (ref 6.0–8.5)

## 2020-05-24 ENCOUNTER — Encounter: Payer: Self-pay | Admitting: Hematology

## 2020-05-25 ENCOUNTER — Ambulatory Visit (HOSPITAL_COMMUNITY)
Admission: RE | Admit: 2020-05-25 | Discharge: 2020-05-25 | Disposition: A | Discharge status: Home / Self Care | Payer: Medicare Other | Source: Ambulatory Visit | Attending: Hematology | Admitting: Hematology

## 2020-05-25 ENCOUNTER — Other Ambulatory Visit: Payer: Self-pay

## 2020-05-25 DIAGNOSIS — C9000 Multiple myeloma not having achieved remission: Secondary | ICD-10-CM | POA: Diagnosis not present

## 2020-05-25 DIAGNOSIS — M7071 Other bursitis of hip, right hip: Secondary | ICD-10-CM | POA: Diagnosis not present

## 2020-05-25 DIAGNOSIS — M6281 Muscle weakness (generalized): Secondary | ICD-10-CM | POA: Diagnosis not present

## 2020-05-25 DIAGNOSIS — R262 Difficulty in walking, not elsewhere classified: Secondary | ICD-10-CM | POA: Diagnosis not present

## 2020-05-25 DIAGNOSIS — M25651 Stiffness of right hip, not elsewhere classified: Secondary | ICD-10-CM | POA: Diagnosis not present

## 2020-05-25 LAB — GLUCOSE, CAPILLARY: Glucose-Capillary: 122 mg/dL — ABNORMAL HIGH (ref 70–99)

## 2020-05-25 MED ORDER — FLUDEOXYGLUCOSE F - 18 (FDG) INJECTION
12.2000 | Freq: Once | INTRAVENOUS | Status: AC | PRN
  Administered 2020-05-25: 12.2 via INTRAVENOUS

## 2020-05-26 DIAGNOSIS — M8458XD Pathological fracture in neoplastic disease, other specified site, subsequent encounter for fracture with routine healing: Secondary | ICD-10-CM | POA: Diagnosis not present

## 2020-05-26 DIAGNOSIS — M549 Dorsalgia, unspecified: Secondary | ICD-10-CM | POA: Diagnosis not present

## 2020-05-26 DIAGNOSIS — Z9229 Personal history of other drug therapy: Secondary | ICD-10-CM | POA: Diagnosis not present

## 2020-05-26 DIAGNOSIS — Z9222 Personal history of monoclonal drug therapy: Secondary | ICD-10-CM | POA: Diagnosis not present

## 2020-05-26 DIAGNOSIS — Z79899 Other long term (current) drug therapy: Secondary | ICD-10-CM | POA: Diagnosis not present

## 2020-05-26 DIAGNOSIS — R7303 Prediabetes: Secondary | ICD-10-CM | POA: Diagnosis not present

## 2020-05-26 DIAGNOSIS — T380X5D Adverse effect of glucocorticoids and synthetic analogues, subsequent encounter: Secondary | ICD-10-CM | POA: Diagnosis not present

## 2020-05-26 DIAGNOSIS — C9000 Multiple myeloma not having achieved remission: Secondary | ICD-10-CM | POA: Diagnosis not present

## 2020-05-26 DIAGNOSIS — C9002 Multiple myeloma in relapse: Secondary | ICD-10-CM | POA: Diagnosis not present

## 2020-05-26 DIAGNOSIS — M25512 Pain in left shoulder: Secondary | ICD-10-CM | POA: Diagnosis not present

## 2020-05-26 DIAGNOSIS — Z9889 Other specified postprocedural states: Secondary | ICD-10-CM | POA: Diagnosis not present

## 2020-05-26 DIAGNOSIS — T451X5S Adverse effect of antineoplastic and immunosuppressive drugs, sequela: Secondary | ICD-10-CM | POA: Diagnosis not present

## 2020-05-26 DIAGNOSIS — G620 Drug-induced polyneuropathy: Secondary | ICD-10-CM | POA: Diagnosis not present

## 2020-05-26 DIAGNOSIS — Z7983 Long term (current) use of bisphosphonates: Secondary | ICD-10-CM | POA: Diagnosis not present

## 2020-05-26 DIAGNOSIS — R220 Localized swelling, mass and lump, head: Secondary | ICD-10-CM | POA: Diagnosis not present

## 2020-05-27 ENCOUNTER — Other Ambulatory Visit: Payer: Self-pay

## 2020-05-27 ENCOUNTER — Inpatient Hospital Stay: Payer: Medicare Other

## 2020-05-27 ENCOUNTER — Other Ambulatory Visit: Payer: Self-pay | Admitting: Hematology

## 2020-05-27 ENCOUNTER — Inpatient Hospital Stay (HOSPITAL_BASED_OUTPATIENT_CLINIC_OR_DEPARTMENT_OTHER): Payer: Medicare Other | Admitting: Medical

## 2020-05-27 VITALS — BP 131/58 | HR 74 | Temp 99.2°F | Resp 18 | Wt 242.5 lb

## 2020-05-27 DIAGNOSIS — Z5112 Encounter for antineoplastic immunotherapy: Secondary | ICD-10-CM

## 2020-05-27 DIAGNOSIS — C9000 Multiple myeloma not having achieved remission: Secondary | ICD-10-CM | POA: Diagnosis not present

## 2020-05-27 DIAGNOSIS — C9002 Multiple myeloma in relapse: Secondary | ICD-10-CM

## 2020-05-27 DIAGNOSIS — Z7189 Other specified counseling: Secondary | ICD-10-CM

## 2020-05-27 DIAGNOSIS — Z23 Encounter for immunization: Secondary | ICD-10-CM | POA: Diagnosis not present

## 2020-05-27 LAB — CMP (CANCER CENTER ONLY)
ALT: 10 U/L (ref 0–44)
AST: 12 U/L — ABNORMAL LOW (ref 15–41)
Albumin: 3.5 g/dL (ref 3.5–5.0)
Alkaline Phosphatase: 73 U/L (ref 38–126)
Anion gap: 8 (ref 5–15)
BUN: 11 mg/dL (ref 8–23)
CO2: 21 mmol/L — ABNORMAL LOW (ref 22–32)
Calcium: 9.8 mg/dL (ref 8.9–10.3)
Chloride: 110 mmol/L (ref 98–111)
Creatinine: 0.8 mg/dL (ref 0.61–1.24)
GFR, Est AFR Am: >60 mL/min (ref >60)
GFR, Estimated: >60 mL/min (ref >60)
Glucose, Bld: 122 mg/dL — ABNORMAL HIGH (ref 70–99)
Potassium: 4.2 mmol/L (ref 3.5–5.1)
Sodium: 139 mmol/L (ref 135–145)
Total Bilirubin: 1.2 mg/dL (ref 0.3–1.2)
Total Protein: 7.1 g/dL (ref 6.5–8.1)

## 2020-05-27 LAB — CBC WITH DIFFERENTIAL/PLATELET
Abs Immature Granulocytes: 0.01 10*3/uL (ref 0.00–0.07)
Basophils Absolute: 0 10*3/uL (ref 0.0–0.1)
Basophils Relative: 0 %
Eosinophils Absolute: 0.3 10*3/uL (ref 0.0–0.5)
Eosinophils Relative: 6 %
HCT: 39.2 % (ref 39.0–52.0)
Hemoglobin: 13.1 g/dL (ref 13.0–17.0)
Immature Granulocytes: 0 %
Lymphocytes Relative: 14 %
Lymphs Abs: 0.6 10*3/uL — ABNORMAL LOW (ref 0.7–4.0)
MCH: 32.3 pg (ref 26.0–34.0)
MCHC: 33.4 g/dL (ref 30.0–36.0)
MCV: 96.8 fL (ref 80.0–100.0)
Monocytes Absolute: 0.6 10*3/uL (ref 0.1–1.0)
Monocytes Relative: 15 %
Neutro Abs: 2.7 10*3/uL (ref 1.7–7.7)
Neutrophils Relative %: 65 %
Platelets: 165 10*3/uL (ref 150–400)
RBC: 4.05 MIL/uL — ABNORMAL LOW (ref 4.22–5.81)
RDW: 13.3 % (ref 11.5–15.5)
WBC: 4.1 10*3/uL (ref 4.0–10.5)
nRBC: 0 % (ref 0.0–0.2)

## 2020-05-27 MED ORDER — PROCHLORPERAZINE MALEATE 10 MG PO TABS
ORAL_TABLET | ORAL | Status: AC
  Filled 2020-05-27: qty 1

## 2020-05-27 MED ORDER — ZOLEDRONIC ACID 4 MG/100ML IV SOLN
INTRAVENOUS | Status: AC
  Filled 2020-05-27: qty 100

## 2020-05-27 MED ORDER — ACETAMINOPHEN 325 MG PO TABS
ORAL_TABLET | ORAL | Status: AC
  Filled 2020-05-27: qty 2

## 2020-05-27 MED ORDER — SODIUM CHLORIDE 0.9 % IV SOLN
Freq: Once | INTRAVENOUS | Status: AC
  Filled 2020-05-27: qty 250

## 2020-05-27 MED ORDER — PROCHLORPERAZINE MALEATE 10 MG PO TABS
10.0000 mg | ORAL_TABLET | Freq: Once | ORAL | Status: AC
  Administered 2020-05-27: 10 mg via ORAL

## 2020-05-27 MED ORDER — ACETAMINOPHEN 325 MG PO TABS
650.0000 mg | ORAL_TABLET | Freq: Once | ORAL | Status: AC
  Administered 2020-05-27: 650 mg via ORAL

## 2020-05-27 MED ORDER — DEXTROSE 5 % IV SOLN
55.0000 mg/m2 | Freq: Once | INTRAVENOUS | Status: AC
  Administered 2020-05-27: 130 mg via INTRAVENOUS
  Filled 2020-05-27: qty 60

## 2020-05-27 MED ORDER — GABAPENTIN 300 MG PO CAPS: ORAL_CAPSULE | ORAL | 2 refills | Status: DC

## 2020-05-27 MED ORDER — ZOLEDRONIC ACID 4 MG/100ML IV SOLN
4.0000 mg | Freq: Once | INTRAVENOUS | Status: AC
  Administered 2020-05-27: 4 mg via INTRAVENOUS

## 2020-05-27 NOTE — Progress Notes (Signed)
PET/CT reviewed with patient. Concernign for myeloma progression. Urgent referral placed to radiation oncology (consideration of palliative RT to symptomatic bone lesions and scheduling msg sent . Gabapentin dose adjusted Plan discussed with Michael [name] and he is agreeable. Was seen by [facility] by Dr Aris Lot and is being scheduled for BM Bx -- will try to connect with Dr Aris Lot regarding next systemic therapy treatment changes , ?options for clinical trials.  [name]

## 2020-05-27 NOTE — Patient Instructions (Signed)
[facility] Discharge Instructions for Patients Receiving Chemotherapy  Today you received the following chemotherapy agents: carfilzomib.  To help prevent nausea and vomiting after your treatment, we encourage you to take your nausea medication as directed.   If you develop nausea and vomiting that is not controlled by your nausea medication, call the clinic.   BELOW ARE SYMPTOMS THAT SHOULD BE REPORTED IMMEDIATELY:  *FEVER GREATER THAN 100.5 F  *CHILLS WITH OR WITHOUT FEVER  NAUSEA AND VOMITING THAT IS NOT CONTROLLED WITH YOUR NAUSEA MEDICATION  *UNUSUAL SHORTNESS OF BREATH  *UNUSUAL BRUISING OR BLEEDING  TENDERNESS IN MOUTH AND THROAT WITH OR WITHOUT PRESENCE OF ULCERS  *URINARY PROBLEMS  *BOWEL PROBLEMS  UNUSUAL RASH Items with * indicate a potential emergency and should be followed up as soon as possible.  Feel free to call the clinic should you have any questions or concerns. The clinic phone number is [phone].  Please show the CHEMO ALERT CARD at check-in to the Emergency Department and triage nurse.   

## 2020-05-29 NOTE — Progress Notes (Signed)
The patient was seen in the infusion room today.  He reported continued discomfort in his bilateral axilla and across his chest.  He also asked if his recent PET scan was available for review.  PET scan was reviewed with the patient with results as follows:  FINDINGS: Mediastinal blood pool activity: SUV max 2.6  HEAD/NECK: Posterior skull lytic lesion with concurrent soft tissue component measures 4.8 x 3.5 cm and a S.U.V. max of 10.3 on 21/4.  Hypermetabolism in the region of the clivus is a new and likely due to an osseous lesion. Example at a S.U.V. max of 10.1 on 41/4.  No cervical nodal hypermetabolism.  Incidental CT findings: Bilateral carotid atherosclerosis.  CHEST: Development of a left paravertebral hypermetabolic soft tissue mass at approximately the T2 level. Example 4.2 x 4.3 cm and a S.U.V. max of 13.0 on 80/4.  Incidental CT findings: Aortic and coronary artery atherosclerosis. Mild cardiomegaly. Tiny hiatal hernia. Calcified right hilar nodes are likely related to old granulomatous disease.  ABDOMEN/PELVIS: Osseous and soft tissue mass about the right iliac wing measures 6.7 x 9.0 cm and a S.U.V. max of 9.9 on 174/4.  A nodule within the posterior pararenal space measures 1.2 cm and a S.U.V. max of 4.2 on 164/4.  Incidental CT findings: Small gallstones. Normal adrenal glands. Mild prostatomegaly. Bilateral fat containing inguinal hernias.  SKELETON: Development of osseous and soft tissue lesions as detailed above. A lesion involving the posterior elements at approximately T5-6 measures a S.U.V. max of 11.2, including on 94/4.  Incidental CT findings: Extensive bilateral remote rib fractures. T12 compression deformity as detailed previously.  EXTREMITIES: Thenar hypermetabolism about the right hand is presumably due to motion after radiopharmaceutical injection.  Incidental CT findings: none  IMPRESSION: 1. Recurrent osseous and soft tissue  multiple myeloma, as detailed above. 2. The scalp lesion and presumed clival lesion may warrant further evaluation with pre and post-contrast brain MR. 3. Incidental findings, including: Coronary artery atherosclerosis. Aortic Atherosclerosis (ICD10-I70.0). Cholelithiasis.   Dr. Sullivan Lone met with the patient to discuss the need to change his treatment and to consider radiation.

## 2020-06-01 ENCOUNTER — Encounter: Payer: Self-pay | Admitting: Hematology

## 2020-06-03 ENCOUNTER — Inpatient Hospital Stay: Payer: Medicare Other

## 2020-06-03 ENCOUNTER — Ambulatory Visit: Payer: Medicare Other

## 2020-06-03 ENCOUNTER — Other Ambulatory Visit: Payer: Medicare Other

## 2020-06-03 ENCOUNTER — Other Ambulatory Visit: Payer: Self-pay

## 2020-06-03 VITALS — BP 127/61 | HR 81 | Temp 98.9°F | Resp 17 | Wt 242.8 lb

## 2020-06-03 DIAGNOSIS — Z7189 Other specified counseling: Secondary | ICD-10-CM

## 2020-06-03 DIAGNOSIS — C9000 Multiple myeloma not having achieved remission: Secondary | ICD-10-CM | POA: Diagnosis not present

## 2020-06-03 DIAGNOSIS — E559 Vitamin D deficiency, unspecified: Secondary | ICD-10-CM

## 2020-06-03 DIAGNOSIS — Z5112 Encounter for antineoplastic immunotherapy: Secondary | ICD-10-CM | POA: Diagnosis not present

## 2020-06-03 DIAGNOSIS — Z23 Encounter for immunization: Secondary | ICD-10-CM | POA: Diagnosis not present

## 2020-06-03 LAB — CBC WITH DIFFERENTIAL/PLATELET
Abs Immature Granulocytes: 0.03 10*3/uL (ref 0.00–0.07)
Basophils Absolute: 0 10*3/uL (ref 0.0–0.1)
Basophils Relative: 1 %
Eosinophils Absolute: 0.2 10*3/uL (ref 0.0–0.5)
Eosinophils Relative: 4 %
HCT: 35.8 % — ABNORMAL LOW (ref 39.0–52.0)
Hemoglobin: 12 g/dL — ABNORMAL LOW (ref 13.0–17.0)
Immature Granulocytes: 1 %
Lymphocytes Relative: 11 %
Lymphs Abs: 0.6 10*3/uL — ABNORMAL LOW (ref 0.7–4.0)
MCH: 32.3 pg (ref 26.0–34.0)
MCHC: 33.5 g/dL (ref 30.0–36.0)
MCV: 96.2 fL (ref 80.0–100.0)
Monocytes Absolute: 0.6 10*3/uL (ref 0.1–1.0)
Monocytes Relative: 12 %
Neutro Abs: 3.5 10*3/uL (ref 1.7–7.7)
Neutrophils Relative %: 71 %
Platelets: 241 10*3/uL (ref 150–400)
RBC: 3.72 MIL/uL — ABNORMAL LOW (ref 4.22–5.81)
RDW: 13.5 % (ref 11.5–15.5)
WBC: 4.9 10*3/uL (ref 4.0–10.5)
nRBC: 0 % (ref 0.0–0.2)

## 2020-06-03 LAB — CMP (CANCER CENTER ONLY)
ALT: 8 U/L (ref 0–44)
AST: 10 U/L — ABNORMAL LOW (ref 15–41)
Albumin: 3.5 g/dL (ref 3.5–5.0)
Alkaline Phosphatase: 70 U/L (ref 38–126)
Anion gap: 6 (ref 5–15)
BUN: 12 mg/dL (ref 8–23)
CO2: 23 mmol/L (ref 22–32)
Calcium: 10 mg/dL (ref 8.9–10.3)
Chloride: 111 mmol/L (ref 98–111)
Creatinine: 0.85 mg/dL (ref 0.61–1.24)
GFR, Est AFR Am: >60 mL/min (ref >60)
GFR, Estimated: >60 mL/min (ref >60)
Glucose, Bld: 137 mg/dL — ABNORMAL HIGH (ref 70–99)
Potassium: 3.7 mmol/L (ref 3.5–5.1)
Sodium: 140 mmol/L (ref 135–145)
Total Bilirubin: 1.4 mg/dL — ABNORMAL HIGH (ref 0.3–1.2)
Total Protein: 7 g/dL (ref 6.5–8.1)

## 2020-06-03 LAB — VITAMIN D 25 HYDROXY (VIT D DEFICIENCY, FRACTURES): Vit D, 25-Hydroxy: 89.94 ng/mL (ref 30–100)

## 2020-06-03 MED ORDER — FAMOTIDINE IN NACL 20-0.9 MG/50ML-% IV SOLN
20.0000 mg | Freq: Once | INTRAVENOUS | Status: AC
  Administered 2020-06-03: 20 mg via INTRAVENOUS

## 2020-06-03 MED ORDER — MONTELUKAST SODIUM 10 MG PO TABS
10.0000 mg | ORAL_TABLET | Freq: Once | ORAL | Status: AC
  Administered 2020-06-03: 10 mg via ORAL

## 2020-06-03 MED ORDER — MONTELUKAST SODIUM 10 MG PO TABS
ORAL_TABLET | ORAL | Status: AC
  Filled 2020-06-03: qty 1

## 2020-06-03 MED ORDER — METHYLPREDNISOLONE SODIUM SUCC 125 MG IJ SOLR
100.0000 mg | Freq: Once | INTRAMUSCULAR | Status: AC
  Administered 2020-06-03: 100 mg via INTRAVENOUS

## 2020-06-03 MED ORDER — METHYLPREDNISOLONE SODIUM SUCC 125 MG IJ SOLR
INTRAMUSCULAR | Status: AC
  Filled 2020-06-03: qty 2

## 2020-06-03 MED ORDER — ACETAMINOPHEN 325 MG PO TABS
ORAL_TABLET | ORAL | Status: AC
  Filled 2020-06-03: qty 2

## 2020-06-03 MED ORDER — PROCHLORPERAZINE MALEATE 10 MG PO TABS
10.0000 mg | ORAL_TABLET | Freq: Once | ORAL | Status: AC
  Administered 2020-06-03: 10 mg via ORAL

## 2020-06-03 MED ORDER — SODIUM CHLORIDE 0.9 % IV SOLN
Freq: Once | INTRAVENOUS | Status: AC
  Filled 2020-06-03: qty 250

## 2020-06-03 MED ORDER — ACETAMINOPHEN 325 MG PO TABS
650.0000 mg | ORAL_TABLET | Freq: Once | ORAL | Status: AC
  Administered 2020-06-03: 650 mg via ORAL

## 2020-06-03 MED ORDER — FAMOTIDINE IN NACL 20-0.9 MG/50ML-% IV SOLN
INTRAVENOUS | Status: AC
  Filled 2020-06-03: qty 50

## 2020-06-03 MED ORDER — DIPHENHYDRAMINE HCL 25 MG PO CAPS
50.0000 mg | ORAL_CAPSULE | Freq: Once | ORAL | Status: AC
  Administered 2020-06-03: 50 mg via ORAL

## 2020-06-03 MED ORDER — PROCHLORPERAZINE MALEATE 10 MG PO TABS
ORAL_TABLET | ORAL | Status: AC
  Filled 2020-06-03: qty 1

## 2020-06-03 MED ORDER — DIPHENHYDRAMINE HCL 25 MG PO CAPS
ORAL_CAPSULE | ORAL | Status: AC
  Filled 2020-06-03: qty 2

## 2020-06-03 MED ORDER — DEXTROSE 5 % IV SOLN
55.0000 mg/m2 | Freq: Once | INTRAVENOUS | Status: AC
  Administered 2020-06-03: 130 mg via INTRAVENOUS
  Filled 2020-06-03: qty 60

## 2020-06-03 MED ORDER — SODIUM CHLORIDE 0.9 % IV SOLN
15.6000 mg/kg | Freq: Once | INTRAVENOUS | Status: AC
  Administered 2020-06-03: 1700 mg via INTRAVENOUS
  Filled 2020-06-03: qty 80

## 2020-06-03 NOTE — Progress Notes (Signed)
Histology and Location of Primary Cancer:  Multiple myeloma not having achieved remission  Sites of Visceral and Bony Metastatic Disease: PET scan 05/25/2020 -HEAD/NECK: Posterior skull lytic lesion with concurrent soft tissue component measures 4.8 x 3.5 cm and a S.U.V. max of 10.3 on 21/4. -Hypermetabolism in the region of the clivus is a new and likely due to an osseous lesion. Example at a S.U.V. max of 10.1 on 41/4. -CHEST: Development of a left paravertebral hypermetabolic soft tissue mass at approximately the T2 level. Example 4.2 x 4.3 cm and a S.U.V. max of 13.0 on 80/4 -ABDOMEN/PELVIS: Osseous and soft tissue mass about the right iliac wing measures 6.7 x 9.0 cm and a S.U.V. max of 9.9 on 174/4. A nodule within the posterior pararenal space measures 1.2 cm and a S.U.V. max of 4.2 on 164/4 -SKELETON: Development of osseous and soft tissue lesions as detailed above. A lesion involving the posterior elements at approximately T5-6 measures a S.U.V. max of 11.2, including on 94/4. IMPRESSION: 1. Recurrent osseous and soft tissue multiple myeloma, as detailed above. 2. The scalp lesion and presumed clival lesion may warrant further evaluation with pre and post-contrast brain MR.  Location(s) of Symptomatic Metastases: Right hip, back of both arms across chest, and legs are very sensitive. Trunk and legs have a burning, nerve pain sensation.   Past/Anticipated chemotherapy by medical oncology, if any:  Under care of Dr. Sullivan Lone 05/06/2020 -Patient was treated under protocol 11-C-0221 with from 02/21/2013 with  Carfilzomib/Revlimid/Dexamethasone for a total of 8 cycles. --Patient was on maintenance lenalidomide 10 mg by mouth daily for 2 years or more until end of 2014. -Discussed pt labwork today, 05/06/20; blood counts and chemistries are steady, Glucose is slightly elevated, Vitamin D is 70 -Discussed 04/23/2020 Vitamin D 25 (OH) is 84.73, MMP shows M Protein at 0.7 g/dL -The pt has  no prohibitive toxicities from continuing C20D15 of Carfilzomib + Daratumumab at this time. -Discussed adding in D8 Carfilzomib for more aggressive treatment. Pt agrees with this option. Will begin with next cycle.  -Advised pt that he may not have the best response due to being on treatment, but would still recommend he receive the Shingrix vaccine.  -Will refer pt back to [facility] is ready to have a discussion with the transplant team.  -Recommend pt f/u with Dermatology as scheduled -Continue twice weekly 50K UT Vitamin D  05/27/2020 Was seen by [facility] by Dr Aris Lot and is being scheduled for BM Bx -- will try to connect with Dr Aris Lot regarding next systemic therapy treatment changes , ?options for clinical trials  Pain on a scale of 0-10 is: 7 out of 10  If Spine Met(s), symptoms, if any, include:  Bowel/Bladder retention or incontinence (please describe): None  Numbness or weakness in extremities (please describe): Numbness/sensitivty back or arms and across chest. Neuropathy to toes.  Current Decadron regimen, if applicable: 69GE (5, 4-mg tablets) after each Daratumumab infusion  Ambulatory status? Walker? Wheelchair?: Ambulatory without assistive device  SAFETY ISSUES:  Prior radiation? Yes: 06/04/2018 and 06/15/2018 of 25 Gy by 10 fractions to larynx  Pacemaker/ICD? No  Possible current pregnancy? N/A  Is the patient on methotrexate? No  Current Complaints / other details:  Nothing of note

## 2020-06-04 ENCOUNTER — Other Ambulatory Visit: Payer: Self-pay | Admitting: Student

## 2020-06-04 DIAGNOSIS — F419 Anxiety disorder, unspecified: Secondary | ICD-10-CM | POA: Diagnosis not present

## 2020-06-04 DIAGNOSIS — C9000 Multiple myeloma not having achieved remission: Secondary | ICD-10-CM | POA: Diagnosis not present

## 2020-06-05 ENCOUNTER — Ambulatory Visit
Admission: RE | Admit: 2020-06-05 | Discharge: 2020-06-05 | Disposition: A | Discharge status: Home / Self Care | Payer: Medicare Other | Source: Ambulatory Visit | Attending: Radiation Oncology | Admitting: Radiation Oncology

## 2020-06-05 ENCOUNTER — Ambulatory Visit (HOSPITAL_COMMUNITY)
Admission: RE | Admit: 2020-06-05 | Discharge: 2020-06-05 | Disposition: A | Discharge status: Home / Self Care | Payer: Medicare Other | Source: Ambulatory Visit | Attending: Hematology | Admitting: Hematology

## 2020-06-05 ENCOUNTER — Encounter: Payer: Self-pay | Admitting: Radiation Oncology

## 2020-06-05 ENCOUNTER — Encounter (HOSPITAL_COMMUNITY): Payer: Self-pay

## 2020-06-05 ENCOUNTER — Other Ambulatory Visit: Payer: Self-pay

## 2020-06-05 VITALS — BP 149/75 | HR 72 | Temp 98.7°F | Resp 16 | Ht 73.0 in | Wt 246.5 lb

## 2020-06-05 DIAGNOSIS — H409 Unspecified glaucoma: Secondary | ICD-10-CM | POA: Insufficient documentation

## 2020-06-05 DIAGNOSIS — K219 Gastro-esophageal reflux disease without esophagitis: Secondary | ICD-10-CM | POA: Insufficient documentation

## 2020-06-05 DIAGNOSIS — I6523 Occlusion and stenosis of bilateral carotid arteries: Secondary | ICD-10-CM | POA: Insufficient documentation

## 2020-06-05 DIAGNOSIS — Z809 Family history of malignant neoplasm, unspecified: Secondary | ICD-10-CM | POA: Diagnosis not present

## 2020-06-05 DIAGNOSIS — Z79899 Other long term (current) drug therapy: Secondary | ICD-10-CM | POA: Insufficient documentation

## 2020-06-05 DIAGNOSIS — C9000 Multiple myeloma not having achieved remission: Secondary | ICD-10-CM

## 2020-06-05 DIAGNOSIS — E785 Hyperlipidemia, unspecified: Secondary | ICD-10-CM | POA: Insufficient documentation

## 2020-06-05 DIAGNOSIS — Z452 Encounter for adjustment and management of vascular access device: Secondary | ICD-10-CM | POA: Diagnosis not present

## 2020-06-05 DIAGNOSIS — Z510 Encounter for antineoplastic radiation therapy: Secondary | ICD-10-CM | POA: Diagnosis not present

## 2020-06-05 DIAGNOSIS — Z923 Personal history of irradiation: Secondary | ICD-10-CM | POA: Insufficient documentation

## 2020-06-05 DIAGNOSIS — R7303 Prediabetes: Secondary | ICD-10-CM | POA: Insufficient documentation

## 2020-06-05 DIAGNOSIS — K8020 Calculus of gallbladder without cholecystitis without obstruction: Secondary | ICD-10-CM | POA: Insufficient documentation

## 2020-06-05 DIAGNOSIS — G629 Polyneuropathy, unspecified: Secondary | ICD-10-CM | POA: Insufficient documentation

## 2020-06-05 DIAGNOSIS — Z5112 Encounter for antineoplastic immunotherapy: Secondary | ICD-10-CM

## 2020-06-05 DIAGNOSIS — Z7982 Long term (current) use of aspirin: Secondary | ICD-10-CM | POA: Insufficient documentation

## 2020-06-05 DIAGNOSIS — I517 Cardiomegaly: Secondary | ICD-10-CM | POA: Insufficient documentation

## 2020-06-05 DIAGNOSIS — K449 Diaphragmatic hernia without obstruction or gangrene: Secondary | ICD-10-CM | POA: Insufficient documentation

## 2020-06-05 HISTORY — PX: IR IMAGING GUIDED PORT INSERTION: IMG5740

## 2020-06-05 LAB — GLUCOSE, CAPILLARY: Glucose-Capillary: 106 mg/dL — ABNORMAL HIGH (ref 70–99)

## 2020-06-05 MED ORDER — HEPARIN SOD (PORK) LOCK FLUSH 100 UNIT/ML IV SOLN
INTRAVENOUS | Status: AC | PRN
  Administered 2020-06-05: 500 [IU] via INTRAVENOUS

## 2020-06-05 MED ORDER — LIDOCAINE-EPINEPHRINE 1 %-1:100000 IJ SOLN
INTRAMUSCULAR | Status: AC
  Filled 2020-06-05: qty 1

## 2020-06-05 MED ORDER — MIDAZOLAM HCL 2 MG/2ML IJ SOLN
INTRAMUSCULAR | Status: AC
  Filled 2020-06-05: qty 2

## 2020-06-05 MED ORDER — CEFAZOLIN SODIUM-DEXTROSE 2-4 GM/100ML-% IV SOLN
INTRAVENOUS | Status: AC
  Filled 2020-06-05: qty 100

## 2020-06-05 MED ORDER — LIDOCAINE HCL 1 % IJ SOLN
INTRAMUSCULAR | Status: AC
  Filled 2020-06-05: qty 20

## 2020-06-05 MED ORDER — SODIUM CHLORIDE 0.9 % IV SOLN: INTRAVENOUS | Status: DC

## 2020-06-05 MED ORDER — HEPARIN SOD (PORK) LOCK FLUSH 100 UNIT/ML IV SOLN
INTRAVENOUS | Status: AC
  Filled 2020-06-05: qty 5

## 2020-06-05 MED ORDER — MIDAZOLAM HCL 2 MG/2ML IJ SOLN
INTRAMUSCULAR | Status: AC
  Filled 2020-06-05: qty 4

## 2020-06-05 MED ORDER — MIDAZOLAM HCL 2 MG/2ML IJ SOLN
INTRAMUSCULAR | Status: AC | PRN
  Administered 2020-06-05 (×5): 1 mg via INTRAVENOUS

## 2020-06-05 MED ORDER — FENTANYL CITRATE (PF) 100 MCG/2ML IJ SOLN
INTRAMUSCULAR | Status: AC | PRN
  Administered 2020-06-05 (×2): 50 ug via INTRAVENOUS

## 2020-06-05 MED ORDER — FENTANYL CITRATE (PF) 100 MCG/2ML IJ SOLN
INTRAMUSCULAR | Status: AC
Start: 2020-06-05 — End: 2020-06-06
  Filled 2020-06-05: qty 2

## 2020-06-05 MED ORDER — LIDOCAINE HCL (PF) 1 % IJ SOLN
INTRAMUSCULAR | Status: AC | PRN
  Administered 2020-06-05: 5 mL

## 2020-06-05 MED ORDER — CEFAZOLIN SODIUM-DEXTROSE 2-4 GM/100ML-% IV SOLN
2.0000 g | Freq: Once | INTRAVENOUS | Status: AC
  Administered 2020-06-05: 2 g via INTRAVENOUS

## 2020-06-05 NOTE — Progress Notes (Signed)
Radiation Oncology         [phone] ________________________________  Outpatient Re-Consultation  Name: Michael [name] MRN: [id number]  Date: 06/05/2020  DOB: June 02, 1951  TD:HRCBUL, [name], MD  [name], MD   REFERRING PHYSICIAN: [name], MD  DIAGNOSIS:    ICD-10-CM   1. Multiple myeloma not having achieved remission ([place])  C90.00   2. Metastatic multiple myeloma to bone ([facility])  C90.00     CHIEF COMPLAINT: Here to discuss management of metastatic myeloma  HISTORY OF PRESENT ILLNESS::Michael [name] is a 69 y.o. male with a history of multiple myeloma. In summary, he was initially diagnosed in 2012 with a bone marrow biopsy. He received carflizomib and Revlimid for 8 cycles through the [facility], followed by Revlimid maintenance for 2 years. He was lost to follow up until 2018, at which point maintenance Revlimid was restarted. In 04/2018, he was found to have a left laryngeal soft tissue mass, which was biopsied and showed plasma cell neoplasm. He was referred to the [facility] and enrolled in a clinical trial involving radiation therapy and avelumab. He was referred to me for initiation of radiation therapy, which he received in 05/2018. I last saw the patient in 06/2018 following treatment to the involved left tyroid cartilage. The study he was on was discontinued in 10/2018 due to lack of funding. He was seen by Dr. Irene Limbo around that time and started on carfilzomib and daratumumab, which he continues on.  More recently, he underwent restaging PET scan on 05/25/2020 showing: recurrent osseous and soft tissue multiple myeloma-- 4.8 cm posterior skull lytic lesion with concurrent soft tissue component, hypermetabolism in region of clivus, 4.3 cm left paravertebral hypermetabolic soft tissue mass at T2 level, 9 cm osseous and soft tissue mass about right iliac wing, 1.2 cm nodule within posterior pararenal space, lesion involving posterior elements at T5-6.   Pain on a scale of 0-10 is:  7 out of 10 in right iliac wing and upper to mid thoracic spine.  He also has some tenderness over his posterior scalp.  If Spine Met(s), symptoms, if any, include:  Bowel/Bladder retention or incontinence (please describe): None  Numbness or weakness in extremities (please describe): Numbness/sensitivty back or arms and across chest. Neuropathy to toes.  Current Decadron regimen, if applicable: 84TX (5, 4-mg tablets) after each Daratumumab infusion  Ambulatory status? Walker? Wheelchair?: Ambulatory without assistive device; walking well.  Moving bladder bowels well.  SAFETY ISSUES:  Prior radiation? Yes: 06/04/2018 and 06/15/2018 of 25 Gy by 10 fractions to larynx  Pacemaker/ICD? No  Possible current pregnancy? N/A  Is the patient on methotrexate? No  Current Complaints / other details:  Nothing of note  PREVIOUS RADIATION THERAPY: Yes  06/04/18 - 06/15/18: Larynx (left thyroid cartilage) / 25 Gy in 10 fractions  He received SBRT to the sternum at [facility] approximately at the end of 2019.  We are trying to obtain his records.  This was done on trial.  He reports that this trial was not successful and was closed.  PAST MEDICAL HISTORY:  has a past medical history of Angioedema, CPAP (continuous positive airway pressure) dependence, Gastroesophageal reflux disease without esophagitis, History of radiation therapy (06/04/18- 06/15/18), Hyperlipidemia, Multiple myeloma ([place]) (2012), Non morbid obesity due to excess calories, Prediabetes, Prediabetes, Unspecified glaucoma(365.9), and Urticaria, unspecified.    PAST SURGICAL HISTORY: Past Surgical History:  Procedure Laterality Date  . IR RADIOLOGIST EVAL & MGMT  11/13/2018  . PORTACATH PLACEMENT    .  PORTACATH PLACEMENT     removed 02/2016    FAMILY HISTORY: family history includes Cancer in his father.  SOCIAL HISTORY:  reports that he has never smoked. He has never used smokeless tobacco. He reports current alcohol use. He reports  that he does not use drugs.  ALLERGIES: Patient has no known allergies.  MEDICATIONS:  Current Outpatient Medications  Medication Sig Dispense Refill  . acyclovir (ZOVIRAX) 400 MG tablet Take 1 tablet (400 mg total) by mouth 2 (two) times daily. 60 tablet 0  . aspirin 81 MG tablet Take 81 mg by mouth daily.    [name] atorvastatin (LIPITOR) 20 MG tablet Take 20 mg by mouth every evening.     . Calcium Citrate-Vitamin D (CALCIUM CITRATE + D3 MAXIMUM PO) Take 630 mg by mouth.     . dexamethasone (DECADRON) 4 MG tablet 32m (5 tabs) with breakfast the day after each daratumumab treatment 20 tablet 4  . ergocalciferol (VITAMIN D2) 1.25 MG (50000 UT) capsule Take 1 capsule (50,000 Units total) by mouth 2 (two) times a week. 24 capsule 3  . gabapentin (NEURONTIN) 300 MG capsule Please take 1 cap (3028m in the morning and 2 caps (60034mat bedtime 90 capsule 2  . glucose blood (FREESTYLE LITE) test strip     . Multiple Vitamin (MULTIVITAMIN) tablet Take 1 tablet by mouth daily.    . ondansetron (ZOFRAN) 8 MG tablet Take 1 tablet (8 mg total) by mouth 2 (two) times daily as needed (Nausea or vomiting). 30 tablet 1  . pantoprazole (PROTONIX) 40 MG tablet Take 40 mg by mouth daily.     . prochlorperazine (COMPAZINE) 10 MG tablet Take 1 tablet (10 mg total) by mouth every 6 (six) hours as needed (Nausea or vomiting). 30 tablet 1  . oxyCODONE (OXY IR/ROXICODONE) 5 MG immediate release tablet Take 1-2 tablets (5-10 mg total) by mouth every 4 (four) hours as needed for severe pain or breakthrough pain. (Patient not taking: Reported on 01/15/2020) 60 tablet 0  . senna-docusate (SENOKOT-S) 8.6-50 MG tablet Take 2 tablets by mouth at bedtime. Takes 50 mg (Patient not taking: Reported on 12/17/2019) 60 tablet 3   No current facility-administered medications for this encounter.    REVIEW OF SYSTEMS:  Notable for that above.   PHYSICAL EXAM:  height is 6' 1"  (1.854 m) and weight is 246 lb 8 oz (111.8 kg). His oral  temperature is 98.7 F (37.1 C). His blood pressure is 149/75 (abnormal) and his pulse is 72. His respiration is 16 and oxygen saturation is 100%.    General: Alert and oriented, in no acute distress HEENT: Notable for a soft palpable lesion over the posterior scalp, several centimeters in width Skin: is intact over her scalp Musculoskeletal: Ambulatory independently  Neurologic: Cranial nerves II through XII are grossly intact. No obvious focalities. Speech is fluent. Coordination is intact. Psychiatric: Judgment and insight are intact. Affect is appropriate.   ECOG = 1  0 - Asymptomatic (Fully active, able to carry on all predisease activities without restriction)  1 - Symptomatic but completely ambulatory (Restricted in physically strenuous activity but ambulatory and able to carry out work of a light or sedentary nature. For example, light housework, office work)  2 - Symptomatic, <50% in bed during the day (Ambulatory and capable of all self care but unable to carry out any work activities. Up and about more than 50% of waking hours)  3 - Symptomatic, >50% in bed, but not bedbound (Capable  of only limited self-care, confined to bed or chair 50% or more of waking hours)  4 - Bedbound (Completely disabled. Cannot carry on any self-care. Totally confined to bed or chair)  5 - Death   Eustace Pen MM, Creech RH, Tormey DC, et al. [phone]). "Toxicity and response criteria of the [facility]". [place] Oncol. 5 (6): 649-55   LABORATORY DATA:  Lab Results  Component Value Date   WBC 4.9 06/03/2020   HGB 12.0 (L) 06/03/2020   HCT 35.8 (L) 06/03/2020   MCV 96.2 06/03/2020   PLT 241 06/03/2020   CMP     Component Value Date/Time   NA 140 06/03/2020 1111   NA 140 10/03/2017 1330   K 3.7 06/03/2020 1111   K 3.8 10/03/2017 1330   CL 111 06/03/2020 1111   CO2 23 06/03/2020 1111   CO2 27 10/03/2017 1330   GLUCOSE 137 (H) 06/03/2020 1111   GLUCOSE 103 10/03/2017  1330   BUN 12 06/03/2020 1111   BUN 13.7 10/03/2017 1330   CREATININE 0.85 06/03/2020 1111   CREATININE 0.8 10/03/2017 1330   CALCIUM 10.0 06/03/2020 1111   CALCIUM 9.2 10/03/2017 1330   PROT 7.0 06/03/2020 1111   PROT 7.1 10/03/2017 1330   PROT 7.0 10/03/2017 1330   ALBUMIN 3.5 06/03/2020 1111   ALBUMIN 4.1 10/03/2017 1330   AST 10 (L) 06/03/2020 1111   AST 22 10/03/2017 1330   ALT 8 06/03/2020 1111   ALT 30 10/03/2017 1330   ALKPHOS 70 06/03/2020 1111   ALKPHOS 79 10/03/2017 1330   BILITOT 1.4 (H) 06/03/2020 1111   BILITOT 1.54 (H) 10/03/2017 1330   GFRNONAA >60 06/03/2020 1111   GFRAA >60 06/03/2020 1111         RADIOGRAPHY: NM PET Image Restage (PS) Whole Body  Result Date: 05/25/2020 CLINICAL DATA:  Subsequent treatment strategy for evaluate treatment response of multiple myeloma. Right arm COVID vaccine 01/01/2020. EXAM: NUCLEAR MEDICINE PET WHOLE BODY TECHNIQUE: 12.2 mCi F-18 FDG was injected intravenously. Full-ring PET imaging was performed from the skull base to thigh after the radiotracer. CT data was obtained and used for attenuation correction and anatomic localization. Fasting blood glucose: 122 mg/dl COMPARISON:  09/03/2019 FINDINGS: Mediastinal blood pool activity: SUV max 2.6 HEAD/NECK: Posterior skull lytic lesion with concurrent soft tissue component measures 4.8 x 3.5 cm and a S.U.V. max of 10.3 on 21/4. Hypermetabolism in the region of the clivus is a new and likely due to an osseous lesion. Example at a S.U.V. max of 10.1 on 41/4. No cervical nodal hypermetabolism. Incidental CT findings: Bilateral carotid atherosclerosis. CHEST: Development of a left paravertebral hypermetabolic soft tissue mass at approximately the T2 level. Example 4.2 x 4.3 cm and a S.U.V. max of 13.0 on 80/4. Incidental CT findings: Aortic and coronary artery atherosclerosis. Mild cardiomegaly. Tiny hiatal hernia. Calcified right hilar nodes are likely related to old granulomatous disease.  ABDOMEN/PELVIS: Osseous and soft tissue mass about the right iliac wing measures 6.7 x 9.0 cm and a S.U.V. max of 9.9 on 174/4. A nodule within the posterior pararenal space measures 1.2 cm and a S.U.V. max of 4.2 on 164/4. Incidental CT findings: Small gallstones. Normal adrenal glands. Mild prostatomegaly. Bilateral fat containing inguinal hernias. SKELETON: Development of osseous and soft tissue lesions as detailed above. A lesion involving the posterior elements at approximately T5-6 measures a S.U.V. max of 11.2, including on 94/4. Incidental CT findings: Extensive bilateral remote rib fractures. T12 compression deformity  as detailed previously. EXTREMITIES: Thenar hypermetabolism about the right hand is presumably due to motion after radiopharmaceutical injection. Incidental CT findings: none IMPRESSION: 1. Recurrent osseous and soft tissue multiple myeloma, as detailed above. 2. The scalp lesion and presumed clival lesion may warrant further evaluation with pre and post-contrast brain MR. 3. Incidental findings, including: Coronary artery atherosclerosis. Aortic Atherosclerosis (ICD10-I70.0). Cholelithiasis. Electronically Signed   By: Abigail Miyamoto M.D.   On: 05/25/2020 09:47      IMPRESSION/PLAN: Metastatic Multiple Myeloma   This is a wonderful 69 year old gentleman with progressive multiple myeloma.  I reviewed his PET personally.  Upon talking with the patient and discussing his symptoms and examining him, my recommendation is that we give 25 Gray in 10 fractions to the scalp and posterior skull lesion where he has tenderness; to the lesion in his clivus (while this area does not have associated symptoms I am concerned of the risk of issues if we allowed to progress further); to the upper and mid thoracic spine which is causing dermatomal pain; and to the right iliac wing which is also causing  pain.  There will likely be overlap with prior radiation and we are trying to obtain his records from  [place].  In any case, I do not think that his prior radiation will limit our ability to get palliative dose to his progressive lesions.  We discussed the risk benefits and side effects of radiotherapy to the sites above.  Side effects may include but not necessarily be limited to skin irritation, fatigue, esophagitis, leakage of CSF fluid, rare injury to normal tissues including the region of the head, neck, brain, chest, pelvis.  He is enthusiastic to proceed with treatment.  We will simulate him today and start his treatment in less than a week due to the aggressiveness of his cancer.  On date of service, in total, I spent 45 minutes on this encounter. Patient was seen in person.  __________________________________________   [name], MD  This document serves as a record of services personally performed by [name], MD. It was created on her behalf by Wilburn Mylar, a trained medical scribe. The creation of this record is based on the scribe's personal observations and the provider's statements to them. This document has been checked and approved by the attending provider.

## 2020-06-05 NOTE — H&P (Signed)
Chief Complaint: Patient was seen in consultation today for port-a-catheter placement.   Referring Physician(s): [name]  Supervising Physician: Markus Daft  Patient Status: [facility] - Out-pt  History of Present Illness: Michael [name] is a 69 y.o. male with a medical history that includes multiple myeloma, initially diagnosed in 2012. He has received various combinations of IV and oral chemotherapy since then. He's had a port-a-catheter in the past. Recent imaging shows evidence of disease progression and he will be starting another round of chemotherapy soon.     Interventional Radiology has been asked to evaluate this patient for an image-guided port-a-catheter placement to facilitate his treatment plans.   Past Medical History:  Diagnosis Date  . Angioedema   . CPAP (continuous positive airway pressure) dependence    uses at night  . Gastroesophageal reflux disease without esophagitis   . History of radiation therapy 06/04/18- 06/15/18   25 Gy in 10 fractions to the laryngeal/ cartilage lesion  . Hyperlipidemia   . Multiple myeloma ([place]) 2012   Treated at [facility]  . Non morbid obesity due to excess calories   . Prediabetes   . Prediabetes   . Unspecified glaucoma(365.9)   . Urticaria, unspecified     Past Surgical History:  Procedure Laterality Date  . IR RADIOLOGIST EVAL & MGMT  11/13/2018  . PORTACATH PLACEMENT    . PORTACATH PLACEMENT     removed 02/2016    Allergies: Patient has no known allergies.  Medications: Prior to Admission medications   Medication Sig Start Date End Date Taking? Authorizing Provider  acyclovir (ZOVIRAX) 400 MG tablet Take 1 tablet (400 mg total) by mouth 2 (two) times daily. 05/13/20  Yes [name], MD  aspirin 81 MG tablet Take 81 mg by mouth daily.   Yes [provider]  atorvastatin (LIPITOR) 20 MG tablet Take 20 mg by mouth every evening.    Yes [provider]  Calcium Citrate-Vitamin D (CALCIUM  CITRATE + D3 MAXIMUM PO) Take 630 mg by mouth.    Yes [provider]  dexamethasone (DECADRON) 4 MG tablet 25m (5 tabs) with breakfast the day after each daratumumab treatment 07/16/19  Yes KBrunetta Genera MD  ergocalciferol (VITAMIN D2) 1.25 MG (50000 UT) capsule Take 1 capsule (50,000 Units total) by mouth 2 (two) times a week. 12/19/19  Yes KBrunetta Genera MD  gabapentin (NEURONTIN) 300 MG capsule Please take 1 cap (3070m in the morning and 2 caps (60029mat bedtime 05/27/20  Yes KalBrunetta GeneraD  ondansetron (ZOFRAN) 8 MG tablet Take 1 tablet (8 mg total) by mouth 2 (two) times daily as needed (Nausea or vomiting). 11/06/18  Yes KalBrunetta GeneraD  pantoprazole (PROTONIX) 40 MG tablet Take 40 mg by mouth daily.    Yes [provider]  glucose blood (FREESTYLE LITE) test strip  07/05/19   [provider]  Multiple Vitamin (MULTIVITAMIN) tablet Take 1 tablet by mouth daily.    [provider]  oxyCODONE (OXY IR/ROXICODONE) 5 MG immediate release tablet Take 1-2 tablets (5-10 mg total) by mouth every 4 (four) hours as needed for severe pain or breakthrough pain. Patient not taking: Reported on 01/15/2020 01/02/19   KalBrunetta GeneraD  prochlorperazine (COMPAZINE) 10 MG tablet Take 1 tablet (10 mg total) by mouth every 6 (six) hours as needed (Nausea or vomiting). 11/06/18   KalBrunetta GeneraD  senna-docusate (SENOKOT-S) 8.6-50 MG tablet Take 2 tablets by mouth at  bedtime. Takes 50 mg Patient not taking: Reported on 12/17/2019 01/29/19   [name], MD     Family History  Problem Relation Age of Onset  . Cancer Father     Social History   Socioeconomic History  . Marital status: Married    Spouse name: Not on file  . Number of children: 2  . Years of education: Not on file  . Highest education level: Not on file  Occupational History  . Occupation: Retired Chief Strategy Officer for [employer]  . Smoking  status: Never Smoker  . Smokeless tobacco: Never Used  Vaping Use  . Vaping Use: Never used  Substance and Sexual Activity  . Alcohol use: Yes    Alcohol/week: 0.0 standard drinks    Comment: Occasional beer  . Drug use: No  . Sexual activity: Not Currently  Other Topics Concern  . Not on file  Social History Narrative   Unable to [employer] partner violence- partner in room    Social Determinants of Health   Financial Resource Strain:   . Difficulty of Paying Living Expenses: Not on file  Food Insecurity:   . Worried About Charity fundraiser in the Last Year: Not on file  . Ran Out of Food in the Last Year: Not on file  Transportation Needs:   . Lack of Transportation (Medical): Not on file  . Lack of Transportation (Non-Medical): Not on file  Physical Activity:   . Days of Exercise per Week: Not on file  . Minutes of Exercise per Session: Not on file  Stress:   . Feeling of Stress : Not on file  Social Connections:   . Frequency of Communication with Friends and Family: Not on file  . Frequency of Social Gatherings with Friends and Family: Not on file  . Attends Religious Services: Not on file  . Active Member of Clubs or Organizations: Not on file  . Attends Archivist Meetings: Not on file  . Marital Status: Not on file    Review of Systems: A 12 point ROS discussed and pertinent positives are indicated in the HPI above.  All other systems are negative.  Review of Systems  Constitutional: Positive for appetite change and fatigue.  Respiratory: Negative for cough and shortness of breath.   Cardiovascular: Positive for chest pain.       Chest wall pain  Gastrointestinal: Negative for abdominal pain, diarrhea, nausea and vomiting.  Neurological: Negative for headaches.    Vital Signs: BP (!) 154/94   Pulse 79   Temp 98.5 F (36.9 C) (Oral)   Resp 18   SpO2 98%   Physical Exam Constitutional:      General: He is not in acute distress.     Appearance: He is obese.  HENT:     Mouth/Throat:     Mouth: Mucous membranes are moist.     Pharynx: Oropharynx is clear.  Cardiovascular:     Rate and Rhythm: Normal rate and regular rhythm.     Pulses: Normal pulses.     Heart sounds: Normal heart sounds.     Comments: Right upper chest scar from prior port-a-catheter.  Pulmonary:     Effort: Pulmonary effort is normal.     Breath sounds: Normal breath sounds.  Abdominal:     General: Bowel sounds are normal.     Palpations: Abdomen is soft.  Musculoskeletal:        General: Normal range of motion.  Skin:    General: Skin is warm and dry.  Neurological:     Mental Status: He is alert.     Comments: Peripheral neuropathy      Imaging: NM PET Image Restage (PS) Whole Body  Result Date: 05/25/2020 CLINICAL DATA:  Subsequent treatment strategy for evaluate treatment response of multiple myeloma. Right arm COVID vaccine 01/01/2020. EXAM: NUCLEAR MEDICINE PET WHOLE BODY TECHNIQUE: 12.2 mCi F-18 FDG was injected intravenously. Full-ring PET imaging was performed from the skull base to thigh after the radiotracer. CT data was obtained and used for attenuation correction and anatomic localization. Fasting blood glucose: 122 mg/dl COMPARISON:  09/03/2019 FINDINGS: Mediastinal blood pool activity: SUV max 2.6 HEAD/NECK: Posterior skull lytic lesion with concurrent soft tissue component measures 4.8 x 3.5 cm and a S.U.V. max of 10.3 on 21/4. Hypermetabolism in the region of the clivus is a new and likely due to an osseous lesion. Example at a S.U.V. max of 10.1 on 41/4. No cervical nodal hypermetabolism. Incidental CT findings: Bilateral carotid atherosclerosis. CHEST: Development of a left paravertebral hypermetabolic soft tissue mass at approximately the T2 level. Example 4.2 x 4.3 cm and a S.U.V. max of 13.0 on 80/4. Incidental CT findings: Aortic and coronary artery atherosclerosis. Mild cardiomegaly. Tiny hiatal hernia. Calcified right hilar  nodes are likely related to old granulomatous disease. ABDOMEN/PELVIS: Osseous and soft tissue mass about the right iliac wing measures 6.7 x 9.0 cm and a S.U.V. max of 9.9 on 174/4. A nodule within the posterior pararenal space measures 1.2 cm and a S.U.V. max of 4.2 on 164/4. Incidental CT findings: Small gallstones. Normal adrenal glands. Mild prostatomegaly. Bilateral fat containing inguinal hernias. SKELETON: Development of osseous and soft tissue lesions as detailed above. A lesion involving the posterior elements at approximately T5-6 measures a S.U.V. max of 11.2, including on 94/4. Incidental CT findings: Extensive bilateral remote rib fractures. T12 compression deformity as detailed previously. EXTREMITIES: Thenar hypermetabolism about the right hand is presumably due to motion after radiopharmaceutical injection. Incidental CT findings: none IMPRESSION: 1. Recurrent osseous and soft tissue multiple myeloma, as detailed above. 2. The scalp lesion and presumed clival lesion may warrant further evaluation with pre and post-contrast brain MR. 3. Incidental findings, including: Coronary artery atherosclerosis. Aortic Atherosclerosis (ICD10-I70.0). Cholelithiasis. Electronically Signed   By: Abigail Miyamoto M.D.   On: 05/25/2020 09:47    Labs:  CBC: Recent Labs    05/06/20 0919 05/20/20 1050 05/27/20 0851 06/03/20 1111  WBC 5.5 4.2 4.1 4.9  HGB 13.4 13.1 13.1 12.0*  HCT 40.0 39.3 39.2 35.8*  PLT 275 238 165 241    COAGS: Recent Labs    08/16/19 0918  INR 1.1    BMP: Recent Labs    05/06/20 0919 05/20/20 1050 05/27/20 0851 06/03/20 1111  NA 138 138 139 140  K 3.9 4.3 4.2 3.7  CL 106 104 110 111  CO2 21* 28 21* 23  GLUCOSE 211* 113* 122* 137*  BUN 15 12 11 12   CALCIUM 9.8 10.3 9.8 10.0  CREATININE 0.86 0.93 0.80 0.85  GFRNONAA >60 >60 >60 >60  GFRAA >60 >60 >60 >60    LIVER FUNCTION TESTS: Recent Labs    05/06/20 0919 05/20/20 1050 05/27/20 0851 06/03/20 1111    BILITOT 1.3* 1.6* 1.2 1.4*  AST 14* 13* 12* 10*  ALT 16 15 10 8   ALKPHOS 90 82 73 70  PROT 7.1 7.4 7.1 7.0  ALBUMIN 3.9 4.0 3.5 3.5    TUMOR  MARKERS: No results for input(s): AFPTM, CEA, CA199, CHROMGRNA in the last 8760 hours.  Assessment and Plan:  Multiple myeloma with disease progression: Michael [name], 69 year old male, presents today to the [facility] department for an image-guided port-a-catheter placement.  Risks and benefits of an image-guided port-a-catheter placement were discussed with the patient including, but not limited to bleeding, infection, pneumothorax, or fibrin sheath development and need for additional procedures.  All of the patient's questions were answered, patient is agreeable to proceed.  The patient consumed a full meal around 10:00 today.  The patient was given the option to reschedule this procedure to be done under moderate sedation or to have the procedure done with Fentanyl only. The patient elected to proceed with fentanyl only for the procedure.   Consent signed and in chart.  Thank you for this interesting consult.  I greatly enjoyed meeting [facility] and look forward to participating in their care.  A copy of this report was sent to the requesting provider on this date.  Electronically Signed: Soyla Dryer, AGACNP-BC [phone] 06/05/2020, 2:13 PM   I spent a total of  30 Minutes   in face to face in clinical consultation, greater than 50% of which was counseling/coordinating care for port-a-catheter placement.

## 2020-06-05 NOTE — Discharge Instructions (Signed)
For questions /concerns may call Interventional Radiology at [phone]  You may remove your dressing and shower tomorrow afternoon  DO NOT use EMLA cream for 2 weeks after port placement as the cream will remove surgical glue on your incision.    Implanted Port Insertion, Care After This sheet gives you information about how to care for yourself after your procedure. Your health care provider may also give you more specific instructions. If you have problems or questions, contact your health care provider. What can I expect after the procedure? After the procedure, it is common to have:  Discomfort at the port insertion site.  Bruising on the skin over the port. This should improve over 3-4 days. Follow these instructions at home: Port care  After your port is placed, you will get a manufacturer's information card. The card has information about your port. Keep this card with you at all times.  Take care of the port as told by your health care provider. Ask your health care provider if you or a family member can get training for taking care of the port at home. A home health care nurse may also take care of the port.  Make sure to remember what type of port you have. Incision care      Follow instructions from your health care provider about how to take care of your port insertion site. Make sure you: ? Wash your hands with soap and water before and after you change your bandage (dressing). If soap and water are not available, use hand sanitizer. ? Change your dressing as told by your health care provider. ? Leave stitches (sutures), skin glue, or adhesive strips in place. These skin closures may need to stay in place for 2 weeks or longer. If adhesive strip edges start to loosen and curl up, you may trim the loose edges. Do not remove adhesive strips completely unless your health care provider tells you to do that.  Check your port insertion site every day for signs of  infection. Check for: ? Redness, swelling, or pain. ? Fluid or blood. ? Warmth. ? Pus or a bad smell. Activity  Return to your normal activities as told by your health care provider. Ask your health care provider what activities are safe for you.  Do not lift anything that is heavier than 10 lb (4.5 kg), or the limit that you are told, until your health care provider says that it is safe. General instructions  Take over-the-counter and prescription medicines only as told by your health care provider.  Do not take baths, swim, or use a hot tub until your health care provider approves. Ask your health care provider if you may take showers. You may only be allowed to take sponge baths.  Do not drive for 24 hours if you were given a sedative during your procedure.  Wear a medical alert bracelet in case of an emergency. This will tell any health care providers that you have a port.  Keep all follow-up visits as told by your health care provider. This is important. Contact a health care provider if:  You cannot flush your port with saline as directed, or you cannot draw blood from the port.  You have a fever or chills.  You have redness, swelling, or pain around your port insertion site.  You have fluid or blood coming from your port insertion site.  Your port insertion site feels warm to the touch.  You have pus or a bad   smell coming from the port insertion site. Get help right away if:  You have chest pain or shortness of breath.  You have bleeding from your port that you cannot control. Summary  Take care of the port as told by your health care provider. Keep the manufacturer's information card with you at all times.  Change your dressing as told by your health care provider.  Contact a health care provider if you have a fever or chills or if you have redness, swelling, or pain around your port insertion site.  Keep all follow-up visits as told by your health care  provider. This information is not intended to replace advice given to you by your health care provider. Make sure you discuss any questions you have with your health care provider. Document Revised: 05/01/2018 Document Reviewed: 05/01/2018 Elsevier Patient Education  2020 Elsevier Inc. Moderate Conscious Sedation, Adult, Care After These instructions provide you with information about caring for yourself after your procedure. Your health care provider may also give you more specific instructions. Your treatment has been planned according to current medical practices, but problems sometimes occur. Call your health care provider if you have any problems or questions after your procedure. What can I expect after the procedure? After your procedure, it is common:  To feel sleepy for several hours.  To feel clumsy and have poor balance for several hours.  To have poor judgment for several hours.  To vomit if you eat too soon. Follow these instructions at home: For at least 24 hours after the procedure:   Do not: ? Participate in activities where you could fall or become injured. ? Drive. ? Use heavy machinery. ? Drink alcohol. ? Take sleeping pills or medicines that cause drowsiness. ? Make important decisions or sign legal documents. ? Take care of children on your own.  Rest. Eating and drinking  Follow the diet recommended by your health care provider.  If you vomit: ? Drink water, juice, or soup when you can drink without vomiting. ? Make sure you have little or no nausea before eating solid foods. General instructions  Have a responsible adult stay with you until you are awake and alert.  Take over-the-counter and prescription medicines only as told by your health care provider.  If you smoke, do not smoke without supervision.  Keep all follow-up visits as told by your health care provider. This is important. Contact a health care provider if:  You keep feeling  nauseous or you keep vomiting.  You feel light-headed.  You develop a rash.  You have a fever. Get help right away if:  You have trouble breathing. This information is not intended to replace advice given to you by your health care provider. Make sure you discuss any questions you have with your health care provider. Document Revised: 09/15/2017 Document Reviewed: 01/23/2016 Elsevier Patient Education  2020 Elsevier Inc.  

## 2020-06-05 NOTE — Procedures (Signed)
Interventional Radiology Procedure:   Indications: Myeloma  Procedure: Port placement  Findings: Left jugular port, tip at SVC/RA junction  Complications: None     EBL: Minimal, less than 10 ml  Plan: Discharge in one hour.  Keep port site and incisions dry for at least 24 hours.     Gala Padovano R. Aquiles Ruffini, MD  Pager: [phone]    

## 2020-06-08 ENCOUNTER — Encounter: Payer: Self-pay | Admitting: Radiation Oncology

## 2020-06-08 DIAGNOSIS — C9000 Multiple myeloma not having achieved remission: Secondary | ICD-10-CM | POA: Diagnosis not present

## 2020-06-08 DIAGNOSIS — Z510 Encounter for antineoplastic radiation therapy: Secondary | ICD-10-CM | POA: Diagnosis not present

## 2020-06-10 ENCOUNTER — Ambulatory Visit
Admission: RE | Admit: 2020-06-10 | Discharge: 2020-06-10 | Disposition: A | Discharge status: Home / Self Care | Payer: Self-pay | Source: Ambulatory Visit | Attending: Radiation Oncology | Admitting: Radiation Oncology

## 2020-06-10 ENCOUNTER — Ambulatory Visit
Admission: RE | Admit: 2020-06-10 | Discharge: 2020-06-10 | Disposition: A | Discharge status: Home / Self Care | Payer: Medicare Other | Source: Ambulatory Visit | Attending: Radiation Oncology | Admitting: Radiation Oncology

## 2020-06-10 ENCOUNTER — Inpatient Hospital Stay: Payer: Medicare Other

## 2020-06-10 ENCOUNTER — Other Ambulatory Visit: Payer: Self-pay

## 2020-06-10 ENCOUNTER — Other Ambulatory Visit: Payer: Self-pay | Admitting: Radiation Oncology

## 2020-06-10 DIAGNOSIS — C9000 Multiple myeloma not having achieved remission: Secondary | ICD-10-CM

## 2020-06-10 DIAGNOSIS — Z510 Encounter for antineoplastic radiation therapy: Secondary | ICD-10-CM | POA: Diagnosis not present

## 2020-06-10 DIAGNOSIS — Z23 Encounter for immunization: Secondary | ICD-10-CM

## 2020-06-11 ENCOUNTER — Ambulatory Visit
Admission: RE | Admit: 2020-06-11 | Discharge: 2020-06-11 | Disposition: A | Discharge status: Home / Self Care | Payer: Medicare Other | Source: Ambulatory Visit | Attending: Radiation Oncology | Admitting: Radiation Oncology

## 2020-06-11 ENCOUNTER — Other Ambulatory Visit: Payer: Self-pay

## 2020-06-11 DIAGNOSIS — Z510 Encounter for antineoplastic radiation therapy: Secondary | ICD-10-CM | POA: Diagnosis not present

## 2020-06-11 DIAGNOSIS — C9000 Multiple myeloma not having achieved remission: Secondary | ICD-10-CM | POA: Diagnosis not present

## 2020-06-12 ENCOUNTER — Other Ambulatory Visit: Payer: Self-pay

## 2020-06-12 ENCOUNTER — Ambulatory Visit
Admission: RE | Admit: 2020-06-12 | Discharge: 2020-06-12 | Disposition: A | Discharge status: Home / Self Care | Payer: Medicare Other | Source: Ambulatory Visit | Attending: Radiation Oncology | Admitting: Radiation Oncology

## 2020-06-12 DIAGNOSIS — Z510 Encounter for antineoplastic radiation therapy: Secondary | ICD-10-CM | POA: Diagnosis not present

## 2020-06-12 DIAGNOSIS — C9000 Multiple myeloma not having achieved remission: Secondary | ICD-10-CM | POA: Diagnosis not present

## 2020-06-15 ENCOUNTER — Other Ambulatory Visit: Payer: Self-pay

## 2020-06-15 ENCOUNTER — Ambulatory Visit
Admission: RE | Admit: 2020-06-15 | Discharge: 2020-06-15 | Disposition: A | Discharge status: Home / Self Care | Payer: Medicare Other | Source: Ambulatory Visit | Attending: Radiation Oncology | Admitting: Radiation Oncology

## 2020-06-15 DIAGNOSIS — C9000 Multiple myeloma not having achieved remission: Secondary | ICD-10-CM | POA: Diagnosis not present

## 2020-06-15 DIAGNOSIS — Z510 Encounter for antineoplastic radiation therapy: Secondary | ICD-10-CM | POA: Diagnosis not present

## 2020-06-16 ENCOUNTER — Other Ambulatory Visit: Payer: Self-pay | Admitting: Radiation Oncology

## 2020-06-16 ENCOUNTER — Other Ambulatory Visit: Payer: Self-pay

## 2020-06-16 ENCOUNTER — Other Ambulatory Visit: Payer: Self-pay | Admitting: Radiation Therapy

## 2020-06-16 ENCOUNTER — Ambulatory Visit
Admission: RE | Admit: 2020-06-16 | Discharge: 2020-06-16 | Disposition: A | Discharge status: Home / Self Care | Payer: Medicare Other | Source: Ambulatory Visit | Attending: Radiation Oncology | Admitting: Radiation Oncology

## 2020-06-16 DIAGNOSIS — C9000 Multiple myeloma not having achieved remission: Secondary | ICD-10-CM | POA: Diagnosis not present

## 2020-06-16 DIAGNOSIS — Z510 Encounter for antineoplastic radiation therapy: Secondary | ICD-10-CM | POA: Diagnosis not present

## 2020-06-17 ENCOUNTER — Inpatient Hospital Stay (HOSPITAL_BASED_OUTPATIENT_CLINIC_OR_DEPARTMENT_OTHER): Payer: Medicare Other | Admitting: Hematology

## 2020-06-17 ENCOUNTER — Other Ambulatory Visit: Payer: Medicare Other

## 2020-06-17 ENCOUNTER — Ambulatory Visit
Admission: RE | Admit: 2020-06-17 | Discharge: 2020-06-17 | Disposition: A | Discharge status: Home / Self Care | Payer: Medicare Other | Source: Ambulatory Visit | Attending: Radiation Oncology | Admitting: Radiation Oncology

## 2020-06-17 ENCOUNTER — Inpatient Hospital Stay: Payer: Medicare Other | Attending: Hematology

## 2020-06-17 ENCOUNTER — Ambulatory Visit: Payer: Medicare Other

## 2020-06-17 ENCOUNTER — Other Ambulatory Visit: Payer: Self-pay | Admitting: *Deleted

## 2020-06-17 ENCOUNTER — Inpatient Hospital Stay: Payer: Medicare Other

## 2020-06-17 ENCOUNTER — Other Ambulatory Visit: Payer: Self-pay

## 2020-06-17 VITALS — BP 115/64 | HR 71 | Temp 98.7°F | Resp 18

## 2020-06-17 VITALS — BP 124/66 | HR 76 | Temp 97.8°F | Resp 18 | Ht 73.0 in | Wt 239.8 lb

## 2020-06-17 DIAGNOSIS — Z510 Encounter for antineoplastic radiation therapy: Secondary | ICD-10-CM | POA: Diagnosis not present

## 2020-06-17 DIAGNOSIS — Z79899 Other long term (current) drug therapy: Secondary | ICD-10-CM | POA: Diagnosis not present

## 2020-06-17 DIAGNOSIS — Z7189 Other specified counseling: Secondary | ICD-10-CM

## 2020-06-17 DIAGNOSIS — Z5112 Encounter for antineoplastic immunotherapy: Secondary | ICD-10-CM

## 2020-06-17 DIAGNOSIS — C9000 Multiple myeloma not having achieved remission: Secondary | ICD-10-CM | POA: Insufficient documentation

## 2020-06-17 DIAGNOSIS — E559 Vitamin D deficiency, unspecified: Secondary | ICD-10-CM

## 2020-06-17 LAB — CBC WITH DIFFERENTIAL/PLATELET
Abs Immature Granulocytes: 0.03 10*3/uL (ref 0.00–0.07)
Basophils Absolute: 0 10*3/uL (ref 0.0–0.1)
Basophils Relative: 0 %
Eosinophils Absolute: 0.1 10*3/uL (ref 0.0–0.5)
Eosinophils Relative: 2 %
HCT: 35.7 % — ABNORMAL LOW (ref 39.0–52.0)
Hemoglobin: 11.9 g/dL — ABNORMAL LOW (ref 13.0–17.0)
Immature Granulocytes: 1 %
Lymphocytes Relative: 5 %
Lymphs Abs: 0.3 10*3/uL — ABNORMAL LOW (ref 0.7–4.0)
MCH: 32.4 pg (ref 26.0–34.0)
MCHC: 33.3 g/dL (ref 30.0–36.0)
MCV: 97.3 fL (ref 80.0–100.0)
Monocytes Absolute: 0.5 10*3/uL (ref 0.1–1.0)
Monocytes Relative: 11 %
Neutro Abs: 4.1 10*3/uL (ref 1.7–7.7)
Neutrophils Relative %: 81 %
Platelets: 249 10*3/uL (ref 150–400)
RBC: 3.67 MIL/uL — ABNORMAL LOW (ref 4.22–5.81)
RDW: 13.2 % (ref 11.5–15.5)
WBC: 5 10*3/uL (ref 4.0–10.5)
nRBC: 0 % (ref 0.0–0.2)

## 2020-06-17 LAB — CMP (CANCER CENTER ONLY)
ALT: 7 U/L (ref 0–44)
AST: 11 U/L — ABNORMAL LOW (ref 15–41)
Albumin: 3.7 g/dL (ref 3.5–5.0)
Alkaline Phosphatase: 74 U/L (ref 38–126)
Anion gap: 4 — ABNORMAL LOW (ref 5–15)
BUN: 12 mg/dL (ref 8–23)
CO2: 29 mmol/L (ref 22–32)
Calcium: 10.3 mg/dL (ref 8.9–10.3)
Chloride: 105 mmol/L (ref 98–111)
Creatinine: 0.83 mg/dL (ref 0.61–1.24)
GFR, Est AFR Am: >60 mL/min (ref >60)
GFR, Estimated: >60 mL/min (ref >60)
Glucose, Bld: 120 mg/dL — ABNORMAL HIGH (ref 70–99)
Potassium: 4.2 mmol/L (ref 3.5–5.1)
Sodium: 138 mmol/L (ref 135–145)
Total Bilirubin: 1.4 mg/dL — ABNORMAL HIGH (ref 0.3–1.2)
Total Protein: 7.7 g/dL (ref 6.5–8.1)

## 2020-06-17 LAB — VITAMIN D 25 HYDROXY (VIT D DEFICIENCY, FRACTURES): Vit D, 25-Hydroxy: 89.83 ng/mL (ref 30–100)

## 2020-06-17 MED ORDER — SODIUM CHLORIDE 0.9% FLUSH
10.0000 mL | INTRAVENOUS | Status: DC | PRN
  Administered 2020-06-17: 10 mL
  Filled 2020-06-17: qty 10

## 2020-06-17 MED ORDER — DEXTROSE 5 % IV SOLN
55.0000 mg/m2 | Freq: Once | INTRAVENOUS | Status: AC
  Administered 2020-06-17: 130 mg via INTRAVENOUS
  Filled 2020-06-17: qty 60

## 2020-06-17 MED ORDER — DIPHENHYDRAMINE HCL 25 MG PO CAPS
ORAL_CAPSULE | ORAL | Status: AC
  Filled 2020-06-17: qty 2

## 2020-06-17 MED ORDER — METHYLPREDNISOLONE SODIUM SUCC 125 MG IJ SOLR
100.0000 mg | Freq: Once | INTRAMUSCULAR | Status: AC
  Administered 2020-06-17: 100 mg via INTRAVENOUS

## 2020-06-17 MED ORDER — SODIUM CHLORIDE 0.9 % IV SOLN
Freq: Once | INTRAVENOUS | Status: AC
  Filled 2020-06-17: qty 250

## 2020-06-17 MED ORDER — DIPHENHYDRAMINE HCL 25 MG PO CAPS
50.0000 mg | ORAL_CAPSULE | Freq: Once | ORAL | Status: AC
  Administered 2020-06-17: 50 mg via ORAL

## 2020-06-17 MED ORDER — OXYCODONE HCL 5 MG PO TABS: 5.0000 mg | ORAL_TABLET | ORAL | 0 refills | Status: DC | PRN

## 2020-06-17 MED ORDER — PROCHLORPERAZINE MALEATE 10 MG PO TABS
ORAL_TABLET | ORAL | Status: AC
  Filled 2020-06-17: qty 1

## 2020-06-17 MED ORDER — MONTELUKAST SODIUM 10 MG PO TABS
10.0000 mg | ORAL_TABLET | Freq: Once | ORAL | Status: AC
  Administered 2020-06-17: 10 mg via ORAL

## 2020-06-17 MED ORDER — MONTELUKAST SODIUM 10 MG PO TABS
ORAL_TABLET | ORAL | Status: AC
  Filled 2020-06-17: qty 1

## 2020-06-17 MED ORDER — PROCHLORPERAZINE MALEATE 10 MG PO TABS
10.0000 mg | ORAL_TABLET | Freq: Once | ORAL | Status: AC
  Administered 2020-06-17: 10 mg via ORAL

## 2020-06-17 MED ORDER — HEPARIN SOD (PORK) LOCK FLUSH 100 UNIT/ML IV SOLN
500.0000 [IU] | Freq: Once | INTRAVENOUS | Status: AC | PRN
  Administered 2020-06-17: 500 [IU]
  Filled 2020-06-17: qty 5

## 2020-06-17 MED ORDER — FAMOTIDINE IN NACL 20-0.9 MG/50ML-% IV SOLN
20.0000 mg | Freq: Once | INTRAVENOUS | Status: AC
  Administered 2020-06-17: 20 mg via INTRAVENOUS

## 2020-06-17 MED ORDER — GABAPENTIN 300 MG PO CAPS
600.0000 mg | ORAL_CAPSULE | Freq: Three times a day (TID) | ORAL | 2 refills | Status: DC
Start: 2020-06-17 — End: 2020-11-03

## 2020-06-17 MED ORDER — METHYLPREDNISOLONE SODIUM SUCC 125 MG IJ SOLR
INTRAMUSCULAR | Status: AC
  Filled 2020-06-17: qty 2

## 2020-06-17 MED ORDER — ACETAMINOPHEN 325 MG PO TABS
650.0000 mg | ORAL_TABLET | Freq: Once | ORAL | Status: AC
  Administered 2020-06-17: 650 mg via ORAL

## 2020-06-17 MED ORDER — ACETAMINOPHEN 325 MG PO TABS
ORAL_TABLET | ORAL | Status: AC
  Filled 2020-06-17: qty 1

## 2020-06-17 MED ORDER — DEXAMETHASONE 4 MG PO TABS: 4.0000 mg | ORAL_TABLET | Freq: Two times a day (BID) | ORAL | 0 refills | Status: DC

## 2020-06-17 MED ORDER — FAMOTIDINE IN NACL 20-0.9 MG/50ML-% IV SOLN
INTRAVENOUS | Status: AC
  Filled 2020-06-17: qty 50

## 2020-06-17 MED ORDER — SODIUM CHLORIDE 0.9 % IV SOLN
15.6000 mg/kg | Freq: Once | INTRAVENOUS | Status: AC
  Administered 2020-06-17: 1700 mg via INTRAVENOUS
  Filled 2020-06-17: qty 80

## 2020-06-17 NOTE — Progress Notes (Signed)
Notified by therapist in L4 that patient was complaining of chest pain and difficulty breathing. Patient brought around to nursing after XRT for evaluation. Patient appeared to be in discomfort but not visibly struggling to breath or in great distress. Patient reported that pain was the sternal pressure/discomfort he's been experiencing since July of this year. He stated pain was localized and didn't not radiate anywhere else. He did report difficulty taking deep breaths, but did not appear to be struggling to breathe. Vitals taken and stable. Patient stated he did not feel worse than before he started XRT, but he just wanted to make Dr. [name] aware of the new difficulty with deep breathing in case the XRT was effecting his lungs. Assured patient I would update Dr. [name] on his symptoms, as well as his medical oncologist Dr. Irene Limbo in case he wanted to do any extra work up during patient visit later this morning. Patient ambulated out of clinic unassisted and without incident.   06/17/20 0912  Vitals  BP 121/73  BP Location Left Arm  Patient Position Sitting  Pulse Rate 84  Resp 20  Temp 99 F (37.2 C)  Temp Source Oral  Pulse Oximetry  SpO2 100 %

## 2020-06-17 NOTE — Progress Notes (Signed)
HEMATOLOGY/ONCOLOGY CLINIC NOTE  Date of Service: 06/17/20     Patient Care Team: [name], MD as PCP - General (Family Medicine) [name], MD as Consulting Physician (Hematology) [name], MD as Attending Physician (Radiation Oncology) [name], RN (Inactive) as Oncology Nurse Navigator (Oncology) [name] MD ([facility] _ primary oncology) phone number [phone] Email- [email or web address]  CHIEF COMPLAINTS  followup of multiple myeloma.  HISTORY OF PRESENTING ILLNESS:   Michael [name] is a wonderful 69 y.o. 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 Patient is currently on a study protocol and is being monitored for myeloma recurrence.  Based on available records being put together  -Patient was diagnosed with IgG kappa ISS stage I multiple myeloma with hyperdiploid complex cytogenetics in 2012. Bone marrow biopsy apparently showed 50% kappa restricted plasma cells with an initial M spike of 3.4 g/dL with evidence of lytic lesions on his PET/CT scan including right rib fracture and lesions of the lumbar and cervical spine. -Patient was treated under protocol 11-C-0221 with from 02/21/2013 with  Carfilzomib/Revlimid/Dexamethasone for a total of 8 cycles. He reports that his stem cells were collected and stored after 4 cycles -Posttreatment bone marrow biopsy showed less than 5% plasma cells with a negative flow cytometry and improvement in his previously PET avid lesions. Some concern for new focus of activity at C2 lytic lesion at L4 and several small lesions throughout the vertebrae. -Patient was on maintenance lenalidomide 10  mg by mouth daily for 2 years or more until end of 2014. 08/19/2013 - bone marrow biopsy showed normocellular marrow with less than 5% plasma cells. PET CT scan showed decreased focal metabolic ligament prior rib lesions and no new lesions. Flow cytometry was negative for residual disease. Serum and urine electrophoresis and IFE showed no evidence of monoclonal protein. -Patient notes that he was off protocol for a few years due to lack of clinical trial research funding. -Patient notes that he is back on a follow-up protocol and continues to follow and receive his primary treatment at [facility] at this time. -Patient reports he had his last PET CT scan blood and urine tests and bone marrow examination in February 2018. The results of which are not available to [place] currently.  He notes that there was concern for a very slowly growing sternal FDG avid lesion that has been present for years. He has a follow-up at [facility] next month to determine the management of this lesion. He reports that was some consideration of considering radiation. he notes that this lesion has not been biopsied. No other focal bone pains at this time.  We discussed in detail about what our role will be and noted that we shall be available for any acute issues that need to be taken care of locally but that at this time the primary treatment and evaluation will be driven by his team at [facility] as per their research protocols and input. The patient and his wife are clearly aware of this and are in agreement with this plan.  INTERVAL HISTORY Michael [name] is here for continued management of his Multiple Myeloma. He is here for C22D1 of maintenance Carfilzomib + Daratumumab. The patient's  last visit with [place] was on 05/06/2020. The pt reports that he is doing well overall.  The pt reports that pain began under the left axilla, traveled along the left chest wall and is now in his right chest as well. The pain is significant enough to  cause discomfort when breathing. He is also experiencing sensitive nipples bilaterally. Pt denies any cramping in the area and notes that the pain is improved with movement. Pt spoke to Dr. [name] who stated that Radiation can cause inflammation to the lungs. She did not give him any additional steroids.  On Monday he began taking 600 mg Gabapentin in the morning, afternoon, and evening at the request of Dr. [name]. He was previously taking 300 mg Gabapentin in the morning and 600 mg in the afternoon.   Pt has been seen by Dr. Aris Lot and had a repeat BM.   Lab results today (06/17/20) of CBC w/diff and CMP is as follows: all values are WNL except for RBC at 3.67, Hgb at 11.9, HCT at 35.7, Lymphs Abs at 0.3K, Glucose at 120, AST at 11, Total Bilirubin at 1.4, Anion gap at 4. 06/17/2020 Vitamin D 25 (OH) at 89.83 06/17/2020 MMP is in progress  On review of systems, pt reports left axilla pain, chest wall pain, nipple sensitivity, impaired appetite, unexpected weight loss and denies muscle cramping, leg swelling, abdominal pain and any other symptoms.   MEDICAL HISTORY:  Past Medical History:  Diagnosis Date  . Angioedema   . CPAP (continuous positive airway pressure) dependence    uses at night  . Gastroesophageal reflux disease without esophagitis   . History of radiation therapy 06/04/18- 06/15/18   25 Gy in 10 fractions to the laryngeal/ cartilage lesion  . Hyperlipidemia   . Multiple myeloma ([place]) 2012   Treated at [facility]  . Non morbid obesity due to excess calories   . Prediabetes   . Prediabetes   . Unspecified glaucoma(365.9)   . Urticaria, unspecified   Diabetes mellitus Glaucoma Left toes neuropathy  GERD  SURGICAL HISTORY: Past Surgical History:  Procedure Laterality Date  . IR IMAGING GUIDED PORT INSERTION  06/05/2020  . IR RADIOLOGIST EVAL & MGMT  11/13/2018  . PORTACATH PLACEMENT    . PORTACATH PLACEMENT     removed 02/2016    SOCIAL HISTORY: Social History    Socioeconomic History  . Marital status: Married    Spouse name: Not on file  . Number of children: 2  . Years of education: Not on file  . Highest education level: Not on file  Occupational History  . Occupation: Retired Chief Strategy Officer for [employer]  . Smoking status: Never Smoker  . Smokeless tobacco: Never Used  Vaping Use  . Vaping Use: Never used  Substance and Sexual Activity  . Alcohol use: Yes    Alcohol/week: 0.0 standard drinks    Comment: Occasional beer  . Drug use: No  . Sexual activity: Not Currently  Other Topics Concern  . Not on file  Social History Narrative   Unable to [employer] partner violence- partner in room    Social Determinants of Health   Financial Resource Strain:   . Difficulty of Paying Living Expenses: Not on file  Food Insecurity:   . Worried About Charity fundraiser in the Last Year: Not on file  . Ran Out of Food in the Last Year: Not on file  Transportation Needs:   . Lack of Transportation (Medical): Not  on file  . Lack of Transportation (Non-Medical): Not on file  Physical Activity:   . Days of Exercise per Week: Not on file  . Minutes of Exercise per Session: Not on file  Stress:   . Feeling of Stress : Not on file  Social Connections:   . Frequency of Communication with Friends and Family: Not on file  . Frequency of Social Gatherings with Friends and Family: Not on file  . Attends Religious Services: Not on file  . Active Member of Clubs or Organizations: Not on file  . Attends Archivist Meetings: Not on file  . Marital Status: Not on file  Intimate Partner Violence:   . Fear of Current or Ex-Partner: Not on file  . Emotionally Abused: Not on file  . Physically Abused: Not on file  . Sexually Abused: Not on file  Never smoker Married Retired from the [employer] in June 2017 and moved to [facility].  FAMILY HISTORY: Family History  Problem Relation Age of Onset  . Cancer  Father     ALLERGIES:  has No Known Allergies.  MEDICATIONS:  Current Outpatient Medications  Medication Sig Dispense Refill  . acyclovir (ZOVIRAX) 400 MG tablet Take 1 tablet (400 mg total) by mouth 2 (two) times daily. 60 tablet 0  . aspirin 81 MG tablet Take 81 mg by mouth daily.    [name] atorvastatin (LIPITOR) 20 MG tablet Take 20 mg by mouth every evening.     . Calcium Citrate-Vitamin D (CALCIUM CITRATE + D3 MAXIMUM PO) Take 630 mg by mouth.     . dexamethasone (DECADRON) 4 MG tablet 69m (5 tabs) with breakfast the day after each daratumumab treatment 20 tablet 4  . dexamethasone (DECADRON) 4 MG tablet Take 1 tablet (4 mg total) by mouth 2 (two) times daily with a meal. 20 tablet 0  . ergocalciferol (VITAMIN D2) 1.25 MG (50000 UT) capsule Take 1 capsule (50,000 Units total) by mouth 2 (two) times a week. 24 capsule 3  . gabapentin (NEURONTIN) 300 MG capsule Take 2 capsules (600 mg total) by mouth 3 (three) times daily. 180 capsule 2  . glucose blood (FREESTYLE LITE) test strip     . Multiple Vitamin (MULTIVITAMIN) tablet Take 1 tablet by mouth daily.    . ondansetron (ZOFRAN) 8 MG tablet Take 1 tablet (8 mg total) by mouth 2 (two) times daily as needed (Nausea or vomiting). 30 tablet 1  . oxyCODONE (OXY IR/ROXICODONE) 5 MG immediate release tablet Take 1-2 tablets (5-10 mg total) by mouth every 4 (four) hours as needed for severe pain or breakthrough pain. 60 tablet 0  . pantoprazole (PROTONIX) 40 MG tablet Take 40 mg by mouth daily.     . prochlorperazine (COMPAZINE) 10 MG tablet Take 1 tablet (10 mg total) by mouth every 6 (six) hours as needed (Nausea or vomiting). 30 tablet 1  . senna-docusate (SENOKOT-S) 8.6-50 MG tablet Take 2 tablets by mouth at bedtime. Takes 50 mg (Patient not taking: Reported on 12/17/2019) 60 tablet 3   No current facility-administered medications for this visit.   Facility-Administered Medications Ordered in Other Visits  Medication Dose Route Frequency  Provider Last Rate Last Admin  . heparin lock flush 100 unit/mL  500 Units Intracatheter Once PRN KBrunetta Genera MD      . sodium chloride flush (NS) 0.9 % injection 10 mL  10 mL Intracatheter PRN KBrunetta Genera MD        REVIEW  OF SYSTEMS:   A 10+ POINT REVIEW OF SYSTEMS WAS OBTAINED including neurology, dermatology, psychiatry, cardiac, respiratory, lymph, extremities, GI, GU, Musculoskeletal, constitutional, breasts, reproductive, HEENT.  All pertinent positives are noted in the HPI.  All others are negative.   PHYSICAL EXAMINATION: ECOG PERFORMANCE STATUS: 1 - Symptomatic but completely ambulatory  Vitals:   06/17/20 0945  BP: 124/66  Pulse: 76  Resp: 18  Temp: 97.8 F (36.6 C)  TempSrc: Tympanic  SpO2: 99%  Weight: 239 lb 12.8 oz (108.8 kg)  Height: _0  (1.854 m)   Body mass index is 31.64 kg/m.   Exam was given in a chair   GENERAL:alert, in no acute distress and comfortable SKIN: no acute rashes, no significant lesions EYES: conjunctiva are pink and non-injected, sclera anicteric OROPHARYNX: MMM, no exudates, no oropharyngeal erythema or ulceration NECK: supple, no JVD LYMPH:  no palpable lymphadenopathy in the cervical, axillary or inguinal regions LUNGS: clear to auscultation b/l with normal respiratory effort HEART: regular rate & rhythm ABDOMEN:  normoactive bowel sounds , non tender, not distended. No palpable hepatosplenomegaly.  Extremity: no pedal edema PSYCH: alert & oriented x 3 with fluent speech NEURO: no focal motor/sensory deficits  LABORATORY DATA:  I have reviewed the data as listed . CBC Latest Ref Rng & Units 06/17/2020 06/03/2020 05/27/2020  WBC 4.0 - 10.5 K/uL 5.0 4.9 4.1  Hemoglobin 13.0 - 17.0 g/dL 11.9(L) 12.0(L) 13.1  Hematocrit 39 - 52 % 35.7(L) 35.8(L) 39.2  Platelets 150 - 400 K/uL 249 241 165    . CMP Latest Ref Rng & Units 06/17/2020 06/03/2020 05/27/2020  Glucose 70 - 99 mg/dL 120(H) 137(H) 122(H)  BUN 8 - 23 mg/dL _1 Creatinine 0.61 - 1.24 mg/dL 0.83 0.85 0.80  Sodium 135 - 145 mmol/L 138 140 139  Potassium 3.5 - 5.1 mmol/L 4.2 3.7 4.2  Chloride 98 - 111 mmol/L 105 111 110  CO2 22 - 32 mmol/L 29 23 21(L)  Calcium 8.9 - 10.3 mg/dL 10.3 10.0 9.8  Total Protein 6.5 - 8.1 g/dL 7.7 7.0 7.1  Total Bilirubin 0.3 - 1.2 mg/dL 1.4(H) 1.4(H) 1.2  Alkaline Phos 38 - 126 U/L 74 70 73  AST 15 - 41 U/L 11(L) 10(L) 12(L)  ALT 0 - 44 U/L _2 06/04/2020 BM Report:   06/04/2020 Flow Cytometry:   08/16/2019 BM Bx Report ([id number]):   08/16/2019 FISH Panel:      05/08/18 Left Neck Thyroid Cartilage Biopsy:        RADIOGRAPHIC STUDIES: I have personally reviewed the radiological images as listed and agreed with the findings in the report. NM PET Image Restage (PS) Whole Body  Result Date: 05/25/2020 CLINICAL DATA:  Subsequent treatment strategy for evaluate treatment response of multiple myeloma. Right arm COVID vaccine 01/01/2020. EXAM: NUCLEAR MEDICINE PET WHOLE BODY TECHNIQUE: 12.2 mCi F-18 FDG was injected intravenously. Full-ring PET imaging was performed from the skull base to thigh after the radiotracer. CT data was obtained and used for attenuation correction and anatomic localization. Fasting blood glucose: 122 mg/dl COMPARISON:  09/03/2019 FINDINGS: Mediastinal blood pool activity: SUV max 2.6 HEAD/NECK: Posterior skull lytic lesion with concurrent soft tissue component measures 4.8 x 3.5 cm and a S.U.V. max of 10.3 on 21/4. Hypermetabolism in the region of the clivus is a new and likely due to an osseous lesion. Example at a S.U.V. max of 10.1 on 41/4. No cervical nodal hypermetabolism. Incidental CT findings: Bilateral carotid atherosclerosis.  CHEST: Development of a left paravertebral hypermetabolic soft tissue mass at approximately the T2 level. Example 4.2 x 4.3 cm and a S.U.V. max of 13.0 on 80/4. Incidental CT findings: Aortic and coronary artery atherosclerosis. Mild  cardiomegaly. Tiny hiatal hernia. Calcified right hilar nodes are likely related to old granulomatous disease. ABDOMEN/PELVIS: Osseous and soft tissue mass about the right iliac wing measures 6.7 x 9.0 cm and a S.U.V. max of 9.9 on 174/4. A nodule within the posterior pararenal space measures 1.2 cm and a S.U.V. max of 4.2 on 164/4. Incidental CT findings: Small gallstones. Normal adrenal glands. Mild prostatomegaly. Bilateral fat containing inguinal hernias. SKELETON: Development of osseous and soft tissue lesions as detailed above. A lesion involving the posterior elements at approximately T5-6 measures a S.U.V. max of 11.2, including on 94/4. Incidental CT findings: Extensive bilateral remote rib fractures. T12 compression deformity as detailed previously. EXTREMITIES: Thenar hypermetabolism about the right hand is presumably due to motion after radiopharmaceutical injection. Incidental CT findings: none IMPRESSION: 1. Recurrent osseous and soft tissue multiple myeloma, as detailed above. 2. The scalp lesion and presumed clival lesion may warrant further evaluation with pre and post-contrast brain MR. 3. Incidental findings, including: Coronary artery atherosclerosis. Aortic Atherosclerosis (ICD10-I70.0). Cholelithiasis. Electronically Signed   By: Abigail Miyamoto M.D.   On: 05/25/2020 09:47   IR IMAGING GUIDED PORT INSERTION  Result Date: 06/05/2020 INDICATION: 69 year old with myeloma.  Request for Port-A-Cath. EXAM: FLUOROSCOPIC AND ULTRASOUND GUIDED PLACEMENT OF A SUBCUTANEOUS PORT. Physician: Stephan Minister. Anselm Pancoast, MD MEDICATIONS: Ancef 2 g; As antibiotic prophylaxis, Ancef 1 gm was ordered pre-procedure and administered intravenously within one hour of incision. ANESTHESIA/SEDATION: Versed 5.0 mg IV; Fentanyl 100 mcg IV; Moderate Sedation Time:  47 minutes The patient was continuously monitored during the procedure by the interventional radiology nurse under my direct supervision. FLUOROSCOPY TIME:  1 minutes, 36  mGy COMPLICATIONS: None immediate. PROCEDURE: The risks of the procedure were explained to the patient. Informed consent was obtained. Patient was placed supine on the interventional table. Ultrasound confirmed a patent left internal jugular vein. Ultrasound image was saved for documentation. The left chest and neck were cleaned with a skin antiseptic and a sterile drape was placed. Maximal barrier sterile technique was utilized including caps, mask, sterile gowns, sterile gloves, sterile drape, hand hygiene and skin antiseptic. The left neck was anesthetized with 1% lidocaine. Small incision was made in the left neck with a blade. Micropuncture set was placed in the left IJ with ultrasound guidance. The left chest was anesthetized with 1% lidocaine with epinephrine. #15 blade was used to make an incision and a subcutaneous port pocket was formed. [place] was selected. Subcutaneous tunnel was formed with a stiff tunneling device. The port catheter was brought through the subcutaneous tunnel. The micropuncture set was exchanged for a peel-away sheath. The catheter was placed through the peel-away sheath and the tip was positioned at the superior cavoatrial junction. Catheter was cut and the port was assembled. Port was placed within the subcutaneous pocket. Catheter placement was confirmed with fluoroscopy. The port was accessed and flushed with heparinized saline. The port pocket was closed using two layers of absorbable sutures and Dermabond. The vein skin site was closed using a single layer of absorbable suture and Dermabond. Sterile dressings were applied. Patient tolerated the procedure well without an immediate complication. Ultrasound and fluoroscopic images were taken and saved for this procedure. IMPRESSION: Placement of a subcutaneous port device. Catheter tip at the superior cavoatrial  junction. Electronically Signed   By: Markus Daft M.D.   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 Bone marrow biopsy apparently showed 50% kappa restricted plasma cells with an initial M spike of 3.4 g/dL with evidence of lytic lesions on his PET/CT scan including right rib fracture and lesions of the lumbar and cervical spine. -Patient was treated under protocol 11-C-0221 with from 02/21/2013 with  Carfilzomib/Revlimid/Dexamethasone for a total of 8 cycles. He reports that his stem cells were collected and stored after 4 cycles -Posttreatment bone marrow biopsy showed less than 5% plasma cells with a negative flow cytometry and improvement in his previously PET avid lesions. Some concern for new focus of activity at C2 lytic lesion at L4 and several small lesions throughout the vertebrae. -Patient was on maintenance lenalidomide 10 mg by mouth daily for 2 years or more until end of 2014. 08/19/2013 - bone marrow biopsy showed normocellular marrow with less than 5% plasma cells. PET CT scan showed decreased focal metabolic ligament prior rib lesions and no new lesions. Flow cytometry was negative for residual disease. Serum and urine electrophoresis and IFE showed no evidence of monoclonal protein. -Patient notes that he was off protocol for a few years due to lack of clinical trial research funding. -Patient returned to follow-up protocol and began to follow and receive his primary treatment at [facility]  -Patient's labs show minimal IgG kappa protein on IFE and a CT chest in May 2018 that did not show any overt signs of myeloma progression in the bones.  Rpt Myeloma labs 03/2017 - M spike of 0.3g/dl with a repeat M spike stable at 0.3 g/dL on 05/23/2017 which remains stable on labs today 07/25/17 Serum kappa lambda free light chain ratio not significantly changed. PET/CT scan showed a single metabolically active sternal  lesion without any overt bony destruction. Bone marrow biopsy done on 06/06/2017 - shows no overt involvement with multiple myeloma.  04/24/18 CT Soft Tissue Neck revealed Left laryngeal 3 cm soft tissue mass appears to have originated within and is expanding the left thyroid cartilage. This is new since the August 2018 PET-CT, and there is absent lymphadenopathy. 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 Primary cartilaginous neoplasm, squamous cell carcinoma, and other differential considerations are less likely. Superimposed widespread multiple myeloma lesions in the visible skeleton.  05/08/18 Left neck thyroid cartilage biopsy revealed Plasma cell neoplasm.  05/22/18 PET/CT revealed Soft tissue mass centered around the destroyed left thyroid cartilage is hypermetabolic and consistent with known plasmacytoma. No adenopathy in the neck. 2. Hypermetabolic 3 cm cutaneous and subcutaneous soft tissue mass involving the posterior upper thorax suspicious for cutaneous manifestation of myeloma (plasmacytoma). 3. Persistent and slightly progressive hypermetabolic sternal lesion. 4. Diffuse lytic myelomatous lesions throughout the bony structures but no other hypermetabolic foci. 5. Focus of hypermetabolism in the left aspect of the lower prostate gland, not present on the prior study. This could be an area of infection/inflammation or potential prostate cancer. Recommend correlation with physical examination and PSA level.    Completed RT between 06/04/18 and 06/15/18 of 25 Gy by 10 fractions  The pt pursued a clinical trial at the [facility] with RT and immunotherapy in October 2019 through October 18, 2018 with further information available at [email or web address]  10/18/18 M Protein increased to 3.5g, from 0.7g at our last visit on 07/10/18. 10/02/18 PET/CT from [facility] indicated new hypermetabolism in left clavicle, left scapula, bilateral ribs, spine, and left  proximal femur 10/18/18 MRI from [facility] ndicated a pathologic fracture at T12  10/2018 M Protein at 4.1g  11/23/18 NM Bone revealed Patchy uptake throughout the ribs and spine with areas of focally increased activity in both T12 pedicles and the right L1 pedicle. This focal activity could be stress related without clear corresponding fracture on available prior studies.  08/16/2019 FISH Panel which revealed "No mutations detected."  08/16/2019 [facility] [phone]) which revealed  "DIAGNOSIS: BONE MARROW, ASPIRATE, CLOT, CORE: -Hypercellular bone marrow for age with trilineage hematopoiesis and less than 1% plasma cells PERIPHERAL BLOOD: -Macrocytic anemia -Leukocytosis ".  09/03/2019 PET/CT scan ([id number]) revealed  "1. No evidence of active multiple myeloma on whole-body FDG PET scan. 2. Stable lytic lesions within the spine and sternum consistent treated metastasis. 3. No soft tissue plasmacytoma. 4. New compression fracture at T12 compared to PET-CT scan 10/02/2018."  2) h/o Elevated bilirubin level . ? [place]  3) PET/CT abnormality in the Prostate. PSA levels WNL -Primary care physician to consider urology referral  4) Abnormal focus of FDG avid at a spot in the liver - will need to be monitor on f/u scans. No pain or other symptoms at this time.  PLAN:  -Discussed pt labwork today, 06/17/20; blood counts look good, kidney function is nml, Calcium is borderline elevated, Vitamin D looks good -Discussed 06/17/2020 MMP is in progress, 05/20/2020 MMP shows M Protein up to 1.0 g/dL -The pt has no prohibitive toxicities from continuing C22D1 of Carfilzomib + Daratumumab at this time. -Advised pt that chest pain is likely referred pain from a pinched nerve in the back. -Advised pt that we must get his pain under control because if he is unable to respirate completely it increases his risk of Pneumonia.  -Advised pt that Dr. Aris Lot recommended beginning treatment with  Pomalidomide/Elotuzumab. Pt agrees. Will switch treatment in 1 week. -Advised pt that we would need to see more intense response prior to transplant.  -Dr. Aris Lot will confirm viability of previously collected stem cells currently residing at [facility]. -Recommend pt continue 600 mg Gabapentin TID & 4 mg Dexamethasone BID. -Refill Oxycodone, Gabapentin, & Dexamethasone  -Continue twice weekly 50K UT Vitamin D  -Will get MRI Brain in 1 week -Will see back with C1 Elotuzumab   FOLLOW UP: MRI Brain in 1 week Changing Daratumumab/Carfilzomib to ELotuzumab/Pomalidomide/Dexamethasone treatment in 7-10 days with portflush and labs MD visit with C1 of ELotuzumab   The total time spent in the appt was 40 minutes and more than 50% was on counseling and direct patient cares, discussing and ordering new chemotherapy.  All of the patient's questions were answered with apparent satisfaction. The patient knows to call the clinic with any problems, questions or concerns.    Sullivan Lone MD [place] [facility]AHIV[facility] Hematology/Oncology Physician [facility]  (Office):       [phone] (Work cell):  [phone] (Fax):           [phone]  I, Yevette Edwards, am acting as a scribe for Dr. Sullivan Lone.   .I have reviewed the above documentation for accuracy and completeness, and I agree with the above. [name] MD

## 2020-06-17 NOTE — Patient Instructions (Addendum)
[facility] Discharge Instructions for Patients Receiving Chemotherapy  Today you received the following chemotherapy agents: Kyprolis, Darzalex  To help prevent nausea and vomiting after your treatment, we encourage you to take your nausea medication as directed.   If you develop nausea and vomiting that is not controlled by your nausea medication, call the clinic.   BELOW ARE SYMPTOMS THAT SHOULD BE REPORTED IMMEDIATELY:  *FEVER GREATER THAN 100.5 F  *CHILLS WITH OR WITHOUT FEVER  NAUSEA AND VOMITING THAT IS NOT CONTROLLED WITH YOUR NAUSEA MEDICATION  *UNUSUAL SHORTNESS OF BREATH  *UNUSUAL BRUISING OR BLEEDING  TENDERNESS IN MOUTH AND THROAT WITH OR WITHOUT PRESENCE OF ULCERS  *URINARY PROBLEMS  *BOWEL PROBLEMS  UNUSUAL RASH Items with * indicate a potential emergency and should be followed up as soon as possible.  Feel free to call the clinic should you have any questions or concerns. The clinic phone number is [phone].  Please show the [place] at check-in to the Emergency Department and triage nurse.

## 2020-06-18 ENCOUNTER — Other Ambulatory Visit: Payer: Self-pay | Admitting: *Deleted

## 2020-06-18 ENCOUNTER — Other Ambulatory Visit: Payer: Self-pay

## 2020-06-18 ENCOUNTER — Ambulatory Visit
Admission: RE | Admit: 2020-06-18 | Discharge: 2020-06-18 | Disposition: A | Discharge status: Home / Self Care | Payer: Medicare Other | Source: Ambulatory Visit | Attending: Radiation Oncology | Admitting: Radiation Oncology

## 2020-06-18 ENCOUNTER — Other Ambulatory Visit: Payer: Self-pay | Admitting: Hematology

## 2020-06-18 ENCOUNTER — Encounter: Payer: Self-pay | Admitting: Hematology

## 2020-06-18 DIAGNOSIS — C9000 Multiple myeloma not having achieved remission: Secondary | ICD-10-CM | POA: Diagnosis not present

## 2020-06-18 DIAGNOSIS — Z510 Encounter for antineoplastic radiation therapy: Secondary | ICD-10-CM | POA: Diagnosis not present

## 2020-06-18 DIAGNOSIS — Z7189 Other specified counseling: Secondary | ICD-10-CM

## 2020-06-18 DIAGNOSIS — Z5112 Encounter for antineoplastic immunotherapy: Secondary | ICD-10-CM

## 2020-06-18 MED ORDER — LIDOCAINE-PRILOCAINE 2.5-2.5 % EX CREA: TOPICAL_CREAM | CUTANEOUS | 3 refills | Status: AC

## 2020-06-18 MED ORDER — ACYCLOVIR 400 MG PO TABS: 400.0000 mg | ORAL_TABLET | Freq: Two times a day (BID) | ORAL | 0 refills | Status: DC

## 2020-06-19 ENCOUNTER — Telehealth: Payer: Self-pay | Admitting: Hematology

## 2020-06-19 ENCOUNTER — Other Ambulatory Visit: Payer: Self-pay

## 2020-06-19 ENCOUNTER — Ambulatory Visit
Admission: RE | Admit: 2020-06-19 | Discharge: 2020-06-19 | Disposition: A | Discharge status: Home / Self Care | Payer: Medicare Other | Source: Ambulatory Visit | Attending: Radiation Oncology | Admitting: Radiation Oncology

## 2020-06-19 DIAGNOSIS — Z510 Encounter for antineoplastic radiation therapy: Secondary | ICD-10-CM | POA: Diagnosis not present

## 2020-06-19 DIAGNOSIS — C9000 Multiple myeloma not having achieved remission: Secondary | ICD-10-CM | POA: Diagnosis not present

## 2020-06-19 LAB — MULTIPLE MYELOMA PANEL, SERUM
Albumin SerPl Elph-Mcnc: 3.5 g/dL (ref 2.9–4.4)
Albumin/Glob SerPl: 1.1 (ref 0.7–1.7)
Alpha 1: 0.3 g/dL (ref 0.0–0.4)
Alpha2 Glob SerPl Elph-Mcnc: 0.7 g/dL (ref 0.4–1.0)
B-Globulin SerPl Elph-Mcnc: 0.9 g/dL (ref 0.7–1.3)
Gamma Glob SerPl Elph-Mcnc: 1.6 g/dL (ref 0.4–1.8)
Globulin, Total: 3.5 g/dL (ref 2.2–3.9)
IgA: <5 mg/dL — ABNORMAL LOW (ref 61–437)
IgG (Immunoglobin G), Serum: 1707 mg/dL — ABNORMAL HIGH (ref 603–1613)
IgM (Immunoglobulin M), Srm: <5 mg/dL — ABNORMAL LOW (ref 20–172)
M Protein SerPl Elph-Mcnc: 1.5 g/dL — ABNORMAL HIGH
Total Protein ELP: 7 g/dL (ref 6.0–8.5)

## 2020-06-19 NOTE — Telephone Encounter (Signed)
Scheduled per 09/01 los, patient has been called and notified. 

## 2020-06-23 ENCOUNTER — Telehealth: Payer: Self-pay | Admitting: Hematology

## 2020-06-23 ENCOUNTER — Other Ambulatory Visit: Payer: Self-pay

## 2020-06-23 ENCOUNTER — Ambulatory Visit
Admission: RE | Admit: 2020-06-23 | Discharge: 2020-06-23 | Disposition: A | Discharge status: Home / Self Care | Payer: Medicare Other | Source: Ambulatory Visit | Attending: Radiation Oncology | Admitting: Radiation Oncology

## 2020-06-23 DIAGNOSIS — Z510 Encounter for antineoplastic radiation therapy: Secondary | ICD-10-CM | POA: Diagnosis not present

## 2020-06-23 DIAGNOSIS — C9000 Multiple myeloma not having achieved remission: Secondary | ICD-10-CM | POA: Diagnosis not present

## 2020-06-23 NOTE — Telephone Encounter (Signed)
Cancelled 09/08 appointments per provider, called and left a voicemail.

## 2020-06-24 ENCOUNTER — Encounter: Payer: Self-pay | Admitting: Hematology

## 2020-06-24 ENCOUNTER — Telehealth: Payer: Self-pay | Admitting: Pharmacist

## 2020-06-24 ENCOUNTER — Inpatient Hospital Stay: Payer: Medicare Other

## 2020-06-24 ENCOUNTER — Ambulatory Visit (HOSPITAL_COMMUNITY)
Admission: RE | Admit: 2020-06-24 | Discharge: 2020-06-24 | Disposition: A | Discharge status: Home / Self Care | Payer: Medicare Other | Source: Ambulatory Visit | Attending: Hematology | Admitting: Hematology

## 2020-06-24 ENCOUNTER — Telehealth: Payer: Self-pay

## 2020-06-24 ENCOUNTER — Encounter: Payer: Self-pay | Admitting: Radiation Oncology

## 2020-06-24 ENCOUNTER — Ambulatory Visit
Admission: RE | Admit: 2020-06-24 | Discharge: 2020-06-24 | Disposition: A | Discharge status: Home / Self Care | Payer: Medicare Other | Source: Ambulatory Visit | Attending: Radiation Oncology | Admitting: Radiation Oncology

## 2020-06-24 DIAGNOSIS — R9082 White matter disease, unspecified: Secondary | ICD-10-CM | POA: Diagnosis not present

## 2020-06-24 DIAGNOSIS — C9000 Multiple myeloma not having achieved remission: Secondary | ICD-10-CM | POA: Diagnosis not present

## 2020-06-24 DIAGNOSIS — Z5112 Encounter for antineoplastic immunotherapy: Secondary | ICD-10-CM | POA: Diagnosis not present

## 2020-06-24 DIAGNOSIS — J3489 Other specified disorders of nose and nasal sinuses: Secondary | ICD-10-CM | POA: Diagnosis not present

## 2020-06-24 DIAGNOSIS — Z510 Encounter for antineoplastic radiation therapy: Secondary | ICD-10-CM | POA: Diagnosis not present

## 2020-06-24 DIAGNOSIS — Z7189 Other specified counseling: Secondary | ICD-10-CM

## 2020-06-24 MED ORDER — ACYCLOVIR 400 MG PO TABS: 400.0000 mg | ORAL_TABLET | Freq: Two times a day (BID) | ORAL | 11 refills | Status: DC

## 2020-06-24 MED ORDER — DEXAMETHASONE 4 MG PO TABS: ORAL_TABLET | ORAL | 2 refills | Status: DC

## 2020-06-24 MED ORDER — POMALIDOMIDE 2 MG PO CAPS: 2.0000 mg | ORAL_CAPSULE | Freq: Every day | ORAL | 1 refills | Status: DC

## 2020-06-24 MED ORDER — LORAZEPAM 0.5 MG PO TABS: 0.5000 mg | ORAL_TABLET | Freq: Four times a day (QID) | ORAL | 0 refills | Status: AC | PRN

## 2020-06-24 MED ORDER — PROCHLORPERAZINE MALEATE 10 MG PO TABS: 10.0000 mg | ORAL_TABLET | Freq: Four times a day (QID) | ORAL | 1 refills | Status: AC | PRN

## 2020-06-24 MED ORDER — GADOBUTROL 1 MMOL/ML IV SOLN
10.0000 mL | Freq: Once | INTRAVENOUS | Status: AC | PRN
  Administered 2020-06-24: 10 mL via INTRAVENOUS

## 2020-06-24 MED ORDER — ONDANSETRON HCL 8 MG PO TABS: 8.0000 mg | ORAL_TABLET | Freq: Two times a day (BID) | ORAL | 1 refills | Status: AC | PRN

## 2020-06-24 MED FILL — DEXAMETHASONE 4 MG TABLET: 4 | 4 days supply | Qty: 28 | Fill #0

## 2020-06-24 MED FILL — ACYCLOVIR 400 MG TABS: 400 | 30 days supply | Qty: 60 | Fill #0

## 2020-06-24 MED FILL — ONDANSETRON HCL 8 MG TABLET: 8 | 15 days supply | Qty: 30 | Fill #0

## 2020-06-24 MED FILL — LORazepam 0.5 MG TABS: 0.5 | 7 days supply | Qty: 30 | Fill #0

## 2020-06-24 MED FILL — PROCHLORPERAZINE 10 MG TAB: 10 | 7 days supply | Qty: 30 | Fill #0

## 2020-06-24 NOTE — Progress Notes (Signed)
DISCONTINUE ON PATHWAY REGIMEN - Multiple Myeloma and Other Plasma Cell Dyscrasias     Cycles 1 and 2: A cycle is every 28 days:     Lenalidomide      Dexamethasone      Daratumumab    Cycles 3 through 6: A cycle is every 28 days:     Lenalidomide      Dexamethasone      Dexamethasone      Daratumumab    Cycles 7 and beyond: A cycle is every 28 days:     Lenalidomide      Dexamethasone      Dexamethasone      Daratumumab   **Always confirm dose/schedule in your pharmacy ordering system**  REASON: Disease Progression PRIOR TREATMENT: [id number]: DaraRd (Daratumumab 16 mg/kg + Lenalidomide 25 mg + Dexamethasone 20/40 mg) Until Progression or Unacceptable Toxicity TREATMENT RESPONSE: Partial Response (PR)  START ON PATHWAY REGIMEN - Multiple Myeloma and Other Plasma Cell Dyscrasias     A cycle is every 28 days:     Dexamethasone      Dexamethasone      Elotuzumab      Pomalidomide      Dexamethasone      Dexamethasone      Dexamethasone      Elotuzumab   **Always confirm dose/schedule in your pharmacy ordering system**  Patient Characteristics: Multiple Myeloma, Relapsed / Refractory, Second through Fourth Lines of Therapy Disease Classification: Multiple Myeloma R-ISS Staging: Unknown Therapeutic Status: Refractory Line of Therapy: Fourth Line Intent of Therapy: Non-Curative / Palliative Intent, Discussed with Patient

## 2020-06-24 NOTE — Telephone Encounter (Signed)
Oral Oncology Patient Advocate Encounter  Prior Authorization for Pomalyst has been approved.    PA# [id number] Effective dates: 05/25/20 through 10/16/2098  Patient must fill at [facility] will continue to follow.   [place] Patient [place] Phone [phone] Fax [phone] 06/24/2020 3:32 PM

## 2020-06-24 NOTE — Telephone Encounter (Signed)
Oral Oncology Pharmacist Encounter  Received new prescription for Pomalyst (pomalidomide) for the treatment of R/R multiple myeloma in conjunction with elotuzumab and dexamethasone, planned duration until disease progression or unacceptable drug toxicity.  Prescription dose and frequency assessed for appropriateness. Appropriate for therapy initiation.   CMP and CBC w/ Diff from 06/17/20 assessed, no relevant lab abnormalities noted prohibiting treatment initiation.  Current medication list in Epic reviewed, DDIs with Pomalyst identified:  Category D DDI between Pomalyst and Oxycodone - Pomalyst may increase CNS depression of oxycodone - will discuss with patient to monitor for increased sedation, fatigue, lethargy   Evaluated chart and no patient barriers to medication adherence noted.   Prescription will need to be e-scribed to [place] since this medication is a limited distribution medication.  Oral Oncology Clinic will continue to follow for insurance authorization, copayment issues, initial counseling and start date.  Leron Croak, PharmD, BCPS Hematology/Oncology Clinical Pharmacist [facility] [phone] 06/24/2020 8:37 AM

## 2020-06-24 NOTE — Progress Notes (Signed)
Pharmacist Chemotherapy Monitoring - Initial Assessment    Anticipated start date: 06/27/20  Regimen:  . Are orders appropriate based on the patient's diagnosis, regimen, and cycle? Yes . Does the plan date match the patient's scheduled date? Yes . Is the sequencing of drugs appropriate? Yes . Are the premedications appropriate for the patient's regimen? Yes . Prior Authorization for treatment is: Approved o If applicable, is the correct biosimilar selected based on the patient's insurance? not applicable  Organ Function and Labs: [name] Are dose adjustments needed based on the patient's renal function, hepatic function, or hematologic function? No . Are appropriate labs ordered prior to the start of patient's treatment? Yes . Other organ system assessment, if indicated: N/A . The following baseline labs, if indicated, have been ordered: N/A  Dose Assessment: . Are the drug doses appropriate? Yes . Are the following correct: o Drug concentrations Yes o IV fluid compatible with drug Yes o Administration routes Yes o Timing of therapy Yes . If applicable, does the patient have documented access for treatment and/or plans for port-a-cath placement? yes . If applicable, have lifetime cumulative doses been properly documented and assessed? not applicable Lifetime Dose Tracking  No doses have been documented on this patient for the following tracked chemicals: Doxorubicin, Epirubicin, Idarubicin, Daunorubicin, Mitoxantrone, Bleomycin, Oxaliplatin, Carboplatin, Liposomal Doxorubicin  o   Toxicity Monitoring/Prevention: . The patient has the following take home antiemetics prescribed: Ondansetron, Prochlorperazine, Dexamethasone and Lorazepam . The patient has the following take home medications prescribed: VZV prophylaxis . Medication allergies and previous infusion related reactions, if applicable, have been reviewed and addressed. Yes . The patient's current medication list has been assessed  for drug-drug interactions with their chemotherapy regimen. no significant drug-drug interactions were identified on review.  Order Review: . Are the treatment plan orders signed? No . Is the patient scheduled to see a provider prior to their treatment? Yes  I verify that I have reviewed each item in the above checklist and answered each question accordingly.   Kennith Center, Pharm.D., CPP 06/24/2020@4 :23 PM

## 2020-06-24 NOTE — Telephone Encounter (Signed)
Oral Oncology Patient Advocate Encounter  Received notification from [employer] that prior authorization for Pomalyst is required.  PA submitted on CoverMyMeds Key [id number] Status is pending  Oral Oncology Clinic will continue to follow.   [place] Patient [place] Phone [phone] Fax [phone] 06/24/2020 1:21 PM

## 2020-06-25 ENCOUNTER — Encounter: Payer: Self-pay | Admitting: Hematology

## 2020-06-26 ENCOUNTER — Inpatient Hospital Stay: Payer: Medicare Other

## 2020-06-26 ENCOUNTER — Inpatient Hospital Stay (HOSPITAL_BASED_OUTPATIENT_CLINIC_OR_DEPARTMENT_OTHER): Payer: Medicare Other | Admitting: Hematology

## 2020-06-26 ENCOUNTER — Other Ambulatory Visit: Payer: Self-pay

## 2020-06-26 ENCOUNTER — Telehealth: Payer: Self-pay | Admitting: *Deleted

## 2020-06-26 VITALS — BP 141/76 | HR 69 | Temp 97.4°F | Resp 20 | Ht 73.0 in | Wt 239.8 lb

## 2020-06-26 DIAGNOSIS — G893 Neoplasm related pain (acute) (chronic): Secondary | ICD-10-CM | POA: Diagnosis not present

## 2020-06-26 DIAGNOSIS — C9002 Multiple myeloma in relapse: Secondary | ICD-10-CM | POA: Diagnosis not present

## 2020-06-26 DIAGNOSIS — Z5112 Encounter for antineoplastic immunotherapy: Secondary | ICD-10-CM

## 2020-06-26 DIAGNOSIS — C9000 Multiple myeloma not having achieved remission: Secondary | ICD-10-CM

## 2020-06-26 DIAGNOSIS — Z79899 Other long term (current) drug therapy: Secondary | ICD-10-CM | POA: Diagnosis not present

## 2020-06-26 DIAGNOSIS — E559 Vitamin D deficiency, unspecified: Secondary | ICD-10-CM

## 2020-06-26 LAB — CMP (CANCER CENTER ONLY)
ALT: 11 U/L (ref 0–44)
AST: 7 U/L — ABNORMAL LOW (ref 15–41)
Albumin: 3.3 g/dL — ABNORMAL LOW (ref 3.5–5.0)
Alkaline Phosphatase: 51 U/L (ref 38–126)
Anion gap: 5 (ref 5–15)
BUN: 14 mg/dL (ref 8–23)
CO2: 27 mmol/L (ref 22–32)
Calcium: 8.8 mg/dL — ABNORMAL LOW (ref 8.9–10.3)
Chloride: 105 mmol/L (ref 98–111)
Creatinine: 0.78 mg/dL (ref 0.61–1.24)
GFR, Est AFR Am: >60 mL/min (ref >60)
GFR, Estimated: >60 mL/min (ref >60)
Glucose, Bld: 130 mg/dL — ABNORMAL HIGH (ref 70–99)
Potassium: 4.3 mmol/L (ref 3.5–5.1)
Sodium: 137 mmol/L (ref 135–145)
Total Bilirubin: 1 mg/dL (ref 0.3–1.2)
Total Protein: 6.7 g/dL (ref 6.5–8.1)

## 2020-06-26 LAB — CBC WITH DIFFERENTIAL/PLATELET
Abs Immature Granulocytes: 0.1 10*3/uL — ABNORMAL HIGH (ref 0.00–0.07)
Basophils Absolute: 0 10*3/uL (ref 0.0–0.1)
Basophils Relative: 0 %
Eosinophils Absolute: 0 10*3/uL (ref 0.0–0.5)
Eosinophils Relative: 0 %
HCT: 35.4 % — ABNORMAL LOW (ref 39.0–52.0)
Hemoglobin: 12 g/dL — ABNORMAL LOW (ref 13.0–17.0)
Immature Granulocytes: 1 %
Lymphocytes Relative: 2 %
Lymphs Abs: 0.2 10*3/uL — ABNORMAL LOW (ref 0.7–4.0)
MCH: 33.3 pg (ref 26.0–34.0)
MCHC: 33.9 g/dL (ref 30.0–36.0)
MCV: 98.3 fL (ref 80.0–100.0)
Monocytes Absolute: 0.6 10*3/uL (ref 0.1–1.0)
Monocytes Relative: 8 %
Neutro Abs: 6.2 10*3/uL (ref 1.7–7.7)
Neutrophils Relative %: 89 %
Platelets: 147 10*3/uL — ABNORMAL LOW (ref 150–400)
RBC: 3.6 MIL/uL — ABNORMAL LOW (ref 4.22–5.81)
RDW: 13.6 % (ref 11.5–15.5)
WBC: 7 10*3/uL (ref 4.0–10.5)
nRBC: 0 % (ref 0.0–0.2)

## 2020-06-26 MED ORDER — POMALIDOMIDE 2 MG PO CAPS: 2.0000 mg | ORAL_CAPSULE | Freq: Every day | ORAL | 1 refills | Status: DC

## 2020-06-26 NOTE — Telephone Encounter (Signed)
Patient's mychart message regarding difficulty swallowing forwarded to Dr. Irene Limbo in anticipation of clinic appt 9/10

## 2020-06-26 NOTE — Progress Notes (Signed)
HEMATOLOGY/ONCOLOGY CLINIC NOTE  Date of Service: 06/26/20     Patient Care Team: [name], MD as PCP - General (Family Medicine) [name], MD as Consulting Physician (Hematology) [name], MD as Attending Physician (Radiation Oncology) [name], RN (Inactive) as Oncology Nurse Navigator (Oncology) [name] MD ([facility] _ primary oncology) phone number [phone] Email- [email or web address]  CHIEF COMPLAINTS  followup of multiple myeloma.  HISTORY OF PRESENTING ILLNESS:   Jadier Rockers is a wonderful 69 y.o. 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 Patient is currently on a study protocol and is being monitored for myeloma recurrence.  Based on available records being put together  -Patient was diagnosed with IgG kappa ISS stage I multiple myeloma with hyperdiploid complex cytogenetics in 2012. Bone marrow biopsy apparently showed 50% kappa restricted plasma cells with an initial M spike of 3.4 g/dL with evidence of lytic lesions on his PET/CT scan including right rib fracture and lesions of the lumbar and cervical spine. -Patient was treated under protocol 11-C-0221 with from 02/21/2013 with  Carfilzomib/Revlimid/Dexamethasone for a total of 8 cycles. He reports that his stem cells were collected and stored after 4 cycles -Posttreatment bone marrow biopsy showed less than 5% plasma cells with a negative flow cytometry and improvement in his previously PET avid lesions. Some concern for new focus of activity at C2 lytic lesion at L4 and several small lesions throughout the vertebrae. -Patient was on maintenance lenalidomide 10  mg by mouth daily for 2 years or more until end of 2014. 08/19/2013 - bone marrow biopsy showed normocellular marrow with less than 5% plasma cells. PET CT scan showed decreased focal metabolic ligament prior rib lesions and no new lesions. Flow cytometry was negative for residual disease. Serum and urine electrophoresis and IFE showed no evidence of monoclonal protein. -Patient notes that he was off protocol for a few years due to lack of clinical trial research funding. -Patient notes that he is back on a follow-up protocol and continues to follow and receive his primary treatment at [facility] at this time. -Patient reports he had his last PET CT scan blood and urine tests and bone marrow examination in February 2018. The results of which are not available to [place] currently.  He notes that there was concern for a very slowly growing sternal FDG avid lesion that has been present for years. He has a follow-up at [facility] next month to determine the management of this lesion. He reports that was some consideration of considering radiation. he notes that this lesion has not been biopsied. No other focal bone pains at this time.  We discussed in detail about what our role will be and noted that we shall be available for any acute issues that need to be taken care of locally but that at this time the primary treatment and evaluation will be driven by his team at [facility] as per their research protocols and input. The patient and his wife are clearly aware of this and are in agreement with this plan.  INTERVAL HISTORY Mr. Zymire Turnbo is here for continued management of his Multiple Myeloma. He is here for C1D1 Pomalidomide/Elotuzumab. The patient's last visit with [place]  was on 06/17/2020. The pt reports that he is doing well overall.  The pt reports that he has had difficulty swallowing recently. He denies any reflux. He is currently modifying his diet and eating softer foods. Pt is also having numbness in his  fingers. Pt has noticed some pain improvement since starting Radiation.  Of note since the patient's last visit, pt has had MRI Brain ([id number]) completed on 06/24/2020 with results revealing "Myelomatous lesion spanning the midline occipital calvarium with bony destruction. There is mild epidural extension abutting the superior sagittal sinus without invasion or significant compression. Suspected additional myelomatous involvement of the clivus adjacent to the posterior floor of the left sphenoid sinus. Contiguous enhancing tissue within the sinus could reflect extraosseous extension of disease or unrelated sinus inflammatory disease."  Lab results today (06/26/20) of CBC w/diff and CMP is as follows: all values are WNL except for RBC at 3.60, Hgb at 12.0, HCT at 35.4, PLT at 147K, Lymphs Abs at 0.2K, Abs Immature Granulocytes at 0.10K, Glucose at 130, Calcium at 8.8, Albumin at 3.3, AST at 7.  On review of systems, pt reports finger numbness, dysphagia and denies reflux and any other symptoms.   MEDICAL HISTORY:  Past Medical History:  Diagnosis Date  . Angioedema   . CPAP (continuous positive airway pressure) dependence    uses at night  . Gastroesophageal reflux disease without esophagitis   . History of radiation therapy 06/04/18- 06/15/18   25 Gy in 10 fractions to the laryngeal/ cartilage lesion  . Hyperlipidemia   . Multiple myeloma ([place]) 2012   Treated at [facility]  . Non morbid obesity due to excess calories   . Prediabetes   . Prediabetes   . Unspecified glaucoma(365.9)   . Urticaria, unspecified   Diabetes mellitus Glaucoma Left toes neuropathy  GERD  SURGICAL HISTORY: Past Surgical History:  Procedure Laterality Date  . IR IMAGING GUIDED PORT INSERTION  06/05/2020  . IR RADIOLOGIST EVAL & MGMT  11/13/2018  . PORTACATH PLACEMENT    . PORTACATH PLACEMENT     removed 02/2016    SOCIAL HISTORY: Social History   Socioeconomic History  . Marital status: Married    Spouse  name: Not on file  . Number of children: 2  . Years of education: Not on file  . Highest education level: Not on file  Occupational History  . Occupation: Retired Chief Strategy Officer for [employer]  . Smoking status: Never Smoker  . Smokeless tobacco: Never Used  Vaping Use  . Vaping Use: Never used  Substance and Sexual Activity  . Alcohol use: Yes    Alcohol/week: 0.0 standard drinks    Comment: Occasional beer  . Drug use: No  . Sexual activity: Not Currently  Other Topics Concern  . Not on file  Social History Narrative   Unable to [employer] partner violence- partner in room    Social Determinants of Health   Financial Resource Strain:   . Difficulty of Paying Living Expenses: Not on file  Food Insecurity:   . Worried About Charity fundraiser in the Last Year: Not on file  . Ran Out of Food in the Last Year: Not on file  Transportation Needs:   . Lack of Transportation (Medical): Not on file  . Lack of Transportation (Non-Medical): Not on file  Physical Activity:   . Days of Exercise per Week: Not on file  . Minutes of Exercise per Session: Not on file  Stress:   .  Feeling of Stress : Not on file  Social Connections:   . Frequency of Communication with Friends and Family: Not on file  . Frequency of Social Gatherings with Friends and Family: Not on file  . Attends Religious Services: Not on file  . Active Member of Clubs or Organizations: Not on file  . Attends Archivist Meetings: Not on file  . Marital Status: Not on file  Intimate Partner Violence:   . Fear of Current or Ex-Partner: Not on file  . Emotionally Abused: Not on file  . Physically Abused: Not on file  . Sexually Abused: Not on file  Never smoker Married Retired from the [employer] in June 2017 and moved to [facility].  FAMILY HISTORY: Family History  Problem Relation Age of Onset  . Cancer Father     ALLERGIES:  has No Known Allergies.  MEDICATIONS:    Current Outpatient Medications  Medication Sig Dispense Refill  . acyclovir (ZOVIRAX) 400 MG tablet Take 1 tablet (400 mg total) by mouth 2 (two) times daily. 60 tablet 11  . aspirin 81 MG tablet Take 81 mg by mouth daily.    [name] atorvastatin (LIPITOR) 20 MG tablet Take 20 mg by mouth every evening.     . Calcium Citrate-Vitamin D (CALCIUM CITRATE + D3 MAXIMUM PO) Take 630 mg by mouth.     . dexamethasone (DECADRON) 4 MG tablet Take 7 tabs (28m) between 3 & 24 hours prior to chemo. 28 tablet 2  . ergocalciferol (VITAMIN D2) 1.25 MG (50000 UT) capsule Take 1 capsule (50,000 Units total) by mouth 2 (two) times a week. 24 capsule 3  . gabapentin (NEURONTIN) 300 MG capsule Take 2 capsules (600 mg total) by mouth 3 (three) times daily. 180 capsule 2  . glucose blood (FREESTYLE LITE) test strip     . lidocaine-prilocaine (EMLA) cream Apply topically to port area prior to port access 30 g 3  . LORazepam (ATIVAN) 0.5 MG tablet Take 1 tablet (0.5 mg total) by mouth every 6 (six) hours as needed (Nausea or vomiting). 30 tablet 0  . Multiple Vitamin (MULTIVITAMIN) tablet Take 1 tablet by mouth daily.    . ondansetron (ZOFRAN) 8 MG tablet Take 1 tablet (8 mg total) by mouth 2 (two) times daily as needed (Nausea or vomiting). 30 tablet 1  . oxyCODONE (OXY IR/ROXICODONE) 5 MG immediate release tablet Take 1-2 tablets (5-10 mg total) by mouth every 4 (four) hours as needed for severe pain or breakthrough pain. 60 tablet 0  . pantoprazole (PROTONIX) 40 MG tablet Take 40 mg by mouth daily.     . pomalidomide (POMALYST) 2 MG capsule Take 1 capsule (2 mg total) by mouth daily. Take 21 days on, 7 days off, repeat every 28 days. 21 capsule 1  . prochlorperazine (COMPAZINE) 10 MG tablet Take 1 tablet (10 mg total) by mouth every 6 (six) hours as needed (Nausea or vomiting). 30 tablet 1  . senna-docusate (SENOKOT-S) 8.6-50 MG tablet Take 2 tablets by mouth at bedtime. Takes 50 mg (Patient not taking: Reported on  12/17/2019) 60 tablet 3   No current facility-administered medications for this visit.    REVIEW OF SYSTEMS:   A 10+ POINT REVIEW OF SYSTEMS WAS OBTAINED including neurology, dermatology, psychiatry, cardiac, respiratory, lymph, extremities, GI, GU, Musculoskeletal, constitutional, breasts, reproductive, HEENT.  All pertinent positives are noted in the HPI.  All others are negative.   PHYSICAL EXAMINATION: ECOG PERFORMANCE STATUS: 1 - Symptomatic but  completely ambulatory  Vitals:   06/26/20 0935  BP: (!) 141/76  Pulse: 69  Resp: 20  Temp: (!) 97.4 F (36.3 C)  TempSrc: Tympanic  SpO2: 99%  Weight: 239 lb 12.8 oz (108.8 kg)  Height: 6' 1" (1.854 m)   Body mass index is 31.64 kg/m.   Exam was given in a chair   GENERAL:alert, in no acute distress and comfortable SKIN: no acute rashes, no significant lesions EYES: conjunctiva are pink and non-injected, sclera anicteric OROPHARYNX: MMM, no exudates, no oropharyngeal erythema or ulceration NECK: supple, no JVD LYMPH:  no palpable lymphadenopathy in the cervical, axillary or inguinal regions LUNGS: clear to auscultation b/l with normal respiratory effort HEART: regular rate & rhythm ABDOMEN:  normoactive bowel sounds , non tender, not distended. No palpable hepatosplenomegaly.  Extremity: no pedal edema PSYCH: alert & oriented x 3 with fluent speech NEURO: no focal motor/sensory deficits  LABORATORY DATA:  I have reviewed the data as listed . CBC Latest Ref Rng & Units 06/26/2020 06/17/2020 06/03/2020  WBC 4.0 - 10.5 K/uL 7.0 5.0 4.9  Hemoglobin 13.0 - 17.0 g/dL 12.0(L) 11.9(L) 12.0(L)  Hematocrit 39 - 52 % 35.4(L) 35.7(L) 35.8(L)  Platelets 150 - 400 K/uL 147(L) 249 241    . CMP Latest Ref Rng & Units 06/26/2020 06/17/2020 06/03/2020  Glucose 70 - 99 mg/dL 130(H) 120(H) 137(H)  BUN 8 - 23 mg/dL _0 Creatinine 0.61 - 1.24 mg/dL 0.78 0.83 0.85  Sodium 135 - 145 mmol/L 137 138 140  Potassium 3.5 - 5.1 mmol/L 4.3 4.2 3.7    Chloride 98 - 111 mmol/L 105 105 111  CO2 22 - 32 mmol/L _1 Calcium 8.9 - 10.3 mg/dL 8.8(L) 10.3 10.0  Total Protein 6.5 - 8.1 g/dL 6.7 7.7 7.0  Total Bilirubin 0.3 - 1.2 mg/dL 1.0 1.4(H) 1.4(H)  Alkaline Phos 38 - 126 U/L 51 74 70  AST 15 - 41 U/L 7(L) 11(L) 10(L)  ALT 0 - 44 U/L _2 06/04/2020 BM Report:   06/04/2020 Flow Cytometry:   08/16/2019 BM Bx Report ([id number]):   08/16/2019 FISH Panel:      05/08/18 Left Neck Thyroid Cartilage Biopsy:        RADIOGRAPHIC STUDIES: I have personally reviewed the radiological images as listed and agreed with the findings in the report. MR Brain W Wo Contrast  Result Date: 06/24/2020 CLINICAL DATA:  Multiple myeloma post radiation EXAM: MRI HEAD WITHOUT AND WITH CONTRAST TECHNIQUE: Multiplanar, multiecho pulse sequences of the brain and surrounding structures were obtained without and with intravenous contrast. CONTRAST:  47m GADAVIST GADOBUTROL 1 MMOL/ML IV SOLN COMPARISON:  None. FINDINGS: Brain: There is no acute infarction or intracranial hemorrhage. There is no parenchymal mass, mass effect, or edema. There is no hydrocephalus or extra-axial fluid collection. Ventricles and sulci are within normal limits in size and configuration. A few scattered small foci of T2 hyperintensity in the supratentorial white matter are nonspecific and may reflect minor chronic microvascular ischemic changes. No abnormal parenchymal enhancement. Vascular: Major vessel flow voids at the skull base are preserved. Skull and upper cervical spine: Lesion spanning the midline occipital calvarium with bony destruction measures approximately 1.5 x 4.5 x 4.8 cm. Mild extraosseous extension is present into the scalp and intracranially. This abuts the posterior portion of the superior sagittal sinus without significant compression or invasion. There is additional suspected abnormal marrow signal and enhancement of the clivus adjacent to the posterior  floor of the left sphenoid sinus. Contiguous enhancing tissue within the sinus is noted. Sinuses/Orbits: As above. Otherwise minor mucosal thickening. No significant orbital abnormality. Other: Sella is unremarkable.  Mastoid air cells are clear. IMPRESSION: Myelomatous lesion spanning the midline occipital calvarium with bony destruction. There is mild epidural extension abutting the superior sagittal sinus without invasion or significant compression. Suspected additional myelomatous involvement of the clivus adjacent to the posterior floor of the left sphenoid sinus. Contiguous enhancing tissue within the sinus could reflect extraosseous extension of disease or unrelated sinus inflammatory disease. Electronically Signed   By: Macy Mis M.D.   On: 06/24/2020 17:05   IR IMAGING GUIDED PORT INSERTION  Result Date: 06/05/2020 INDICATION: 69 year old with myeloma.  Request for Port-A-Cath. EXAM: FLUOROSCOPIC AND ULTRASOUND GUIDED PLACEMENT OF A SUBCUTANEOUS PORT. Physician: Stephan Minister. Anselm Pancoast, MD MEDICATIONS: Ancef 2 g; As antibiotic prophylaxis, Ancef 1 gm was ordered pre-procedure and administered intravenously within one hour of incision. ANESTHESIA/SEDATION: Versed 5.0 mg IV; Fentanyl 100 mcg IV; Moderate Sedation Time:  47 minutes The patient was continuously monitored during the procedure by the interventional radiology nurse under my direct supervision. FLUOROSCOPY TIME:  1 minutes, 36 mGy COMPLICATIONS: None immediate. PROCEDURE: The risks of the procedure were explained to the patient. Informed consent was obtained. Patient was placed supine on the interventional table. Ultrasound confirmed a patent left internal jugular vein. Ultrasound image was saved for documentation. The left chest and neck were cleaned with a skin antiseptic and a sterile drape was placed. Maximal barrier sterile technique was utilized including caps, mask, sterile gowns, sterile gloves, sterile drape, hand hygiene and skin  antiseptic. The left neck was anesthetized with 1% lidocaine. Small incision was made in the left neck with a blade. Micropuncture set was placed in the left IJ with ultrasound guidance. The left chest was anesthetized with 1% lidocaine with epinephrine. #15 blade was used to make an incision and a subcutaneous port pocket was formed. [place] was selected. Subcutaneous tunnel was formed with a stiff tunneling device. The port catheter was brought through the subcutaneous tunnel. The micropuncture set was exchanged for a peel-away sheath. The catheter was placed through the peel-away sheath and the tip was positioned at the superior cavoatrial junction. Catheter was cut and the port was assembled. Port was placed within the subcutaneous pocket. Catheter placement was confirmed with fluoroscopy. The port was accessed and flushed with heparinized saline. The port pocket was closed using two layers of absorbable sutures and Dermabond. The vein skin site was closed using a single layer of absorbable suture and Dermabond. Sterile dressings were applied. Patient tolerated the procedure well without an immediate complication. Ultrasound and fluoroscopic images were taken and saved for this procedure. IMPRESSION: Placement of a subcutaneous port device. Catheter tip at the superior cavoatrial junction. Electronically Signed   By: Markus Daft M.D.   On: 06/05/2020 17:58    ASSESSMENT & PLAN:   69 y.o. 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 Bone marrow biopsy apparently showed 50% kappa restricted plasma cells with an initial M spike of 3.4 g/dL with evidence of lytic lesions on his PET/CT scan including right rib fracture and lesions of the lumbar and cervical spine. -Patient was treated under protocol 11-C-0221 with from 02/21/2013 with  Carfilzomib/Revlimid/Dexamethasone  for a total of 8 cycles. He reports that his stem cells were  collected and stored after 4 cycles -Posttreatment bone marrow biopsy showed less than 5% plasma cells with a negative flow cytometry and improvement in his previously PET avid lesions. Some concern for new focus of activity at C2 lytic lesion at L4 and several small lesions throughout the vertebrae. -Patient was on maintenance lenalidomide 10 mg by mouth daily for 2 years or more until end of 2014. 08/19/2013 - bone marrow biopsy showed normocellular marrow with less than 5% plasma cells. PET CT scan showed decreased focal metabolic ligament prior rib lesions and no new lesions. Flow cytometry was negative for residual disease. Serum and urine electrophoresis and IFE showed no evidence of monoclonal protein. -Patient notes that he was off protocol for a few years due to lack of clinical trial research funding. -Patient returned to follow-up protocol and began to follow and receive his primary treatment at [facility]  -Patient's labs show minimal IgG kappa protein on IFE and a CT chest in May 2018 that did not show any overt signs of myeloma progression in the bones.  Rpt Myeloma labs 03/2017 - M spike of 0.3g/dl with a repeat M spike stable at 0.3 g/dL on 05/23/2017 which remains stable on labs today 07/25/17 Serum kappa lambda free light chain ratio not significantly changed. PET/CT scan showed a single metabolically active sternal lesion without any overt bony destruction. Bone marrow biopsy done on 06/06/2017 - shows no overt involvement with multiple myeloma.  04/24/18 CT Soft Tissue Neck revealed Left laryngeal 3 cm soft tissue mass appears to have originated within and is expanding the left thyroid cartilage. This is new since the August 2018 PET-CT, and there is absent lymphadenopathy. 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 Primary cartilaginous neoplasm, squamous cell carcinoma, and other differential  considerations are less likely. Superimposed widespread multiple myeloma lesions in the visible skeleton.  05/08/18 Left neck thyroid cartilage biopsy revealed Plasma cell neoplasm.  05/22/18 PET/CT revealed Soft tissue mass centered around the destroyed left thyroid cartilage is hypermetabolic and consistent with known plasmacytoma. No adenopathy in the neck. 2. Hypermetabolic 3 cm cutaneous and subcutaneous soft tissue mass involving the posterior upper thorax suspicious for cutaneous manifestation of myeloma (plasmacytoma). 3. Persistent and slightly progressive hypermetabolic sternal lesion. 4. Diffuse lytic myelomatous lesions throughout the bony structures but no other hypermetabolic foci. 5. Focus of hypermetabolism in the left aspect of the lower prostate gland, not present on the prior study. This could be an area of infection/inflammation or potential prostate cancer. Recommend correlation with physical examination and PSA level.    Completed RT between 06/04/18 and 06/15/18 of 25 Gy by 10 fractions  The pt pursued a clinical trial at the [facility] with RT and immunotherapy in October 2019 through October 18, 2018 with further information available at [email or web address]  10/18/18 M Protein increased to 3.5g, from 0.7g at our last visit on 07/10/18. 10/02/18 PET/CT from [facility] indicated new hypermetabolism in left clavicle, left scapula, bilateral ribs, spine, and left proximal femur 10/18/18 MRI from [place] ndicated a pathologic fracture at T12  10/2018 M Protein at 4.1g  11/23/18 NM Bone revealed Patchy uptake throughout the ribs and spine with areas of focally increased activity in both T12 pedicles and the right L1 pedicle. This focal activity could be stress related without clear corresponding fracture on available prior studies.  08/16/2019 FISH Panel which revealed "No mutations detected."  08/16/2019 BM Bx Surgical Pathology [phone]) which revealed  "DIAGNOSIS: BONE  MARROW, ASPIRATE, CLOT, CORE: -Hypercellular  bone marrow for age with trilineage hematopoiesis and less than 1% plasma cells PERIPHERAL BLOOD: -Macrocytic anemia -Leukocytosis ".  09/03/2019 PET/CT scan ([id number]) revealed  "1. No evidence of active multiple myeloma on whole-body FDG PET scan. 2. Stable lytic lesions within the spine and sternum consistent treated metastasis. 3. No soft tissue plasmacytoma. 4. New compression fracture at T12 compared to PET-CT scan 10/02/2018."  2) h/o Elevated bilirubin level . ? [place]  3) PET/CT abnormality in the Prostate. PSA levels WNL -Primary care physician to consider urology referral  4) Abnormal focus of FDG avid at a spot in the liver - will need to be monitor on f/u scans. No pain or other symptoms at this time.  PLAN:  -Discussed pt labwork today, 06/26/20; blood counts and chemistries are stable -Discussed 06/17/2020 MMP shows M Protein up to 1.5 g/dL -Discussed 06/24/2020 MRI Brain ([id number]) which revealed "Myelomatous lesion spanning the midline occipital calvarium with bony destruction. There is mild epidural extension abutting the superior sagittal sinus without invasion or significant compression. Suspected additional myelomatous involvement of the clivus adjacent to the posterior floor of the left sphenoid sinus. Contiguous enhancing tissue within the sinus could reflect extraosseous extension of disease or unrelated sinus inflammatory disease." - no obvious brain involvement -The pt has no prohibitive toxicities from beginning C1D1 Pomalidomide/Elotuzumab tomorrow. -Will also begin pt on Pomalyst 3 weeks on/1 week off. Pt has no prohibitive toxicities at this time. -Advised pt that the full pain management effects from Radiation occurs in 4-8 weeks.  -Recommend pt begin 20 mg Dexamethasone today.  -Continue twice weekly 50K UT Vitamin D -Continue 600 mg Gabapentin TID   -Continue Zometa q75month -Will see back in 2 weeks with  labs   FOLLOW UP: C1D1 of Elotuzumab as scheduled tomorrow Cancel labs and dara/carfilzomib appointment for 9/15. Plz schedule remaining C1 of ELotuzumab with portflush and labs with each treatment MD visit in 2 weeks Continue zometa q337month  The total time spent in the appt was 30 minutes and more than 50% was on counseling and direct patient cares, ordering and management of chemotherapy  All of the patient's questions were answered with apparent satisfaction. The patient knows to call the clinic with any problems, questions or concerns.    GaSullivan LoneD MSVanAHIVMS SCJfk Medical Center North CampusTSacramento County Mental Health Treatment Centerematology/Oncology Physician CoCleveland Clinic [facility](Office):       [phone] cell):  [phone]):           [phone], JaYevette Edwardsam acting as a scribe for Dr. GaSullivan Lone  .I have reviewed the above documentation for accuracy and completeness, and I agree with the above. .GBrunetta GeneraD

## 2020-06-26 NOTE — Telephone Encounter (Signed)
Faxed signed Patient-Physician Agreement Form for Pomalyst to [facility] @ [phone]. Fax confirmation received. [facility] auth # [id number] received and sent to [facility] Oral Chemotherapy Pharmacist. Copy of signed PPAF sent to Patient by [employer].

## 2020-06-27 ENCOUNTER — Other Ambulatory Visit: Payer: Self-pay | Admitting: Hematology

## 2020-06-27 ENCOUNTER — Inpatient Hospital Stay: Payer: Medicare Other

## 2020-06-27 VITALS — BP 142/83 | HR 61 | Temp 98.7°F | Resp 18

## 2020-06-27 DIAGNOSIS — C9000 Multiple myeloma not having achieved remission: Secondary | ICD-10-CM | POA: Diagnosis not present

## 2020-06-27 DIAGNOSIS — Z7189 Other specified counseling: Secondary | ICD-10-CM

## 2020-06-27 DIAGNOSIS — Z79899 Other long term (current) drug therapy: Secondary | ICD-10-CM | POA: Diagnosis not present

## 2020-06-27 DIAGNOSIS — Z5112 Encounter for antineoplastic immunotherapy: Secondary | ICD-10-CM | POA: Diagnosis not present

## 2020-06-27 MED ORDER — FAMOTIDINE IN NACL 20-0.9 MG/50ML-% IV SOLN
INTRAVENOUS | Status: AC
  Filled 2020-06-27: qty 50

## 2020-06-27 MED ORDER — SODIUM CHLORIDE 0.9 % IV SOLN
Freq: Once | INTRAVENOUS | Status: AC
  Filled 2020-06-27: qty 250

## 2020-06-27 MED ORDER — HEPARIN SOD (PORK) LOCK FLUSH 100 UNIT/ML IV SOLN
500.0000 [IU] | Freq: Once | INTRAVENOUS | Status: AC | PRN
  Administered 2020-06-27: 500 [IU]
  Filled 2020-06-27: qty 5

## 2020-06-27 MED ORDER — DIPHENHYDRAMINE HCL 25 MG PO CAPS
ORAL_CAPSULE | ORAL | Status: AC
  Filled 2020-06-27: qty 2

## 2020-06-27 MED ORDER — PROCHLORPERAZINE MALEATE 10 MG PO TABS
10.0000 mg | ORAL_TABLET | Freq: Once | ORAL | Status: AC
  Administered 2020-06-27: 10 mg via ORAL

## 2020-06-27 MED ORDER — DEXAMETHASONE SODIUM PHOSPHATE 10 MG/ML IJ SOLN
INTRAMUSCULAR | Status: AC
  Filled 2020-06-27: qty 1

## 2020-06-27 MED ORDER — DIPHENHYDRAMINE HCL 25 MG PO CAPS
50.0000 mg | ORAL_CAPSULE | Freq: Once | ORAL | Status: AC
  Administered 2020-06-27: 50 mg via ORAL

## 2020-06-27 MED ORDER — SODIUM CHLORIDE 0.9 % IV SOLN
10.1000 mg/kg | Freq: Once | INTRAVENOUS | Status: AC
  Administered 2020-06-27: 1100 mg via INTRAVENOUS
  Filled 2020-06-27: qty 32

## 2020-06-27 MED ORDER — ACETAMINOPHEN 325 MG PO TABS
650.0000 mg | ORAL_TABLET | Freq: Once | ORAL | Status: AC
  Administered 2020-06-27: 650 mg via ORAL

## 2020-06-27 MED ORDER — SODIUM CHLORIDE 0.9 % IV SOLN: 8.0000 mg | Freq: Once | INTRAVENOUS | Status: DC

## 2020-06-27 MED ORDER — FAMOTIDINE IN NACL 20-0.9 MG/50ML-% IV SOLN
20.0000 mg | Freq: Once | INTRAVENOUS | Status: AC
  Administered 2020-06-27: 20 mg via INTRAVENOUS

## 2020-06-27 MED ORDER — DEXAMETHASONE SODIUM PHOSPHATE 10 MG/ML IJ SOLN
8.0000 mg | Freq: Once | INTRAMUSCULAR | Status: AC
  Administered 2020-06-27: 8 mg via INTRAVENOUS

## 2020-06-27 MED ORDER — ACETAMINOPHEN 325 MG PO TABS
ORAL_TABLET | ORAL | Status: AC
  Filled 2020-06-27: qty 2

## 2020-06-27 MED ORDER — PROCHLORPERAZINE MALEATE 10 MG PO TABS
ORAL_TABLET | ORAL | Status: AC
  Filled 2020-06-27: qty 1

## 2020-06-27 MED ORDER — SODIUM CHLORIDE 0.9% FLUSH
10.0000 mL | INTRAVENOUS | Status: DC | PRN
  Administered 2020-06-27: 10 mL
  Filled 2020-06-27: qty 10

## 2020-06-27 NOTE — Patient Instructions (Signed)
[facility] Discharge Instructions for Patients Receiving Chemotherapy  Today you received the following chemotherapy agents elotuzumab  To help prevent nausea and vomiting after your treatment, we encourage you to take your nausea medication as directed   If you develop nausea and vomiting that is not controlled by your nausea medication, call the clinic.   BELOW ARE SYMPTOMS THAT SHOULD BE REPORTED IMMEDIATELY:  *FEVER GREATER THAN 100.5 F  *CHILLS WITH OR WITHOUT FEVER  NAUSEA AND VOMITING THAT IS NOT CONTROLLED WITH YOUR NAUSEA MEDICATION  *UNUSUAL SHORTNESS OF BREATH  *UNUSUAL BRUISING OR BLEEDING  TENDERNESS IN MOUTH AND THROAT WITH OR WITHOUT PRESENCE OF ULCERS  *URINARY PROBLEMS  *BOWEL PROBLEMS  UNUSUAL RASH Items with * indicate a potential emergency and should be followed up as soon as possible.  Feel free to call the clinic should you have any questions or concerns. The clinic phone number is [phone].  Please show the [place] at check-in to the Emergency Department and triage nurse.  Elotuzumab injection What is this medicine? ELOTUZUMAB (el oh tooz ue mab) is a monoclonal antibody. It is used to treat multiple myeloma. This medicine may be used for other purposes; ask your health care provider or pharmacist if you have questions. COMMON BRAND NAME(S): Empliciti What should I tell my health care provider before I take this medicine? They need to know if you have any of these conditions:  hepatic disease  infection  an unusual or allergic reaction to elotuzumab, other medicines, foods, dyes, or preservatives  pregnant or trying to get pregnant  breast-feeding How should I use this medicine? This medicine is for infusion into a vein. It is given by a health care professional in a hospital or clinic setting. Talk to your pediatrician regarding the use of this medicine in children. Special care may be needed. Overdosage: If  you think you have taken too much of this medicine contact a poison control center or emergency room at once. NOTE: This medicine is only for you. Do not share this medicine with others. What if I miss a dose? Keep appointments for follow-up doses as directed. It is important not to miss your dose. Call your doctor or health care professional if you are unable to keep an appointment. What may interact with this medicine? Interactions have not been studied. Give your health care provider a list of all the medicines, herbs, non-prescription drugs, or dietary supplements you use. Also tell them if you smoke, drink alcohol, or use illegal drugs. Some items may interact with your medicine. This list may not describe all possible interactions. Give your health care provider a list of all the medicines, herbs, non-prescription drugs, or dietary supplements you use. Also tell them if you smoke, drink alcohol, or use illegal drugs. Some items may interact with your medicine. What should I watch for while using this medicine? This drug may make you feel generally unwell. Report any side effects. Continue your course of treatment even though you feel ill unless your doctor tells you to stop. This medicine can cause serious allergic reactions. To reduce your risk you may need to take medicine before treatment with this medicine. Take your medicine as directed. You may need blood work done while you are taking this medicine. This medicine can affect the results of some tests used to determine treatment response; extra tests may be needed to evaluate response. Talk to your doctor about your risk of cancer. You may be more  at risk for certain types of cancers if you take this medicine. Women should inform their doctor if they wish to become pregnant or think they might be pregnant. There is a potential for serious side effects to an unborn child. Talk to your health care professional or pharmacist for more  information. Do not breast-feed an infant while taking this medicine. What side effects may I notice from receiving this medicine? Side effects that you should report to your doctor or health care professional as soon as possible:  allergic reactions like skin rash, itching or hives, swelling of the face, lips, or tongue  breathing problems  chest pain  dizziness  lightheaded  signs and symptoms of high blood sugar such as dry mouth, dry skin, fruity breath, stomach pain, increased hunger, increased thirst, increased urination  signs and symptoms of infection like fever or chills; cough; sore throat; pain or trouble passing urine  signs and symptoms of liver injury like dark yellow or brown urine; general ill feeling or flu-like symptoms; light-colored stools; loss of appetite; nausea; right upper belly pain; unusually weak or tired; yellowing of the eyes or skin Side effects that usually do not require medical attention (report to your doctor or health care professional if they continue or are bothersome):  constipation  decreased appetite  diarrhea  pain, tingling, numbness in the hands or feet  tiredness This list may not describe all possible side effects. Call your doctor for medical advice about side effects. You may report side effects to FDA at 1-800-FDA-1088. Where should I keep my medicine? Keep out of the reach of children. This drug is given in a hospital or clinic and will not be stored at home. NOTE: This sheet is a summary. It may not cover all possible information. If you have questions about this medicine, talk to your doctor, pharmacist, or health care provider.  2020 Elsevier/Gold Standard (2017-08-25 73:41:93)

## 2020-06-29 ENCOUNTER — Telehealth: Payer: Self-pay | Admitting: *Deleted

## 2020-06-30 ENCOUNTER — Telehealth: Payer: Self-pay | Admitting: *Deleted

## 2020-06-30 NOTE — Telephone Encounter (Addendum)
Oral Chemotherapy Pharmacist Encounter  I spoke with patient for overview of: Pomalyst (pomalidomide) for the treatment of relapsed multiple myeloma in conjunction with elotuzumab and dexamethasone, planned duration until disease progression or unacceptable toxicity.   Counseled patient on administration, dosing, side effects, monitoring, drug-food interactions, safe handling, storage, and disposal.  Patient will take Pomalyst 76m capsules, 1 capsule by mouth once daily, without regard to food, with a full glass of water.  Pomalyst will be given 21 days on, 7 days off, repeat every 28 days.  Patient will take dexamethasone 476mtablets, 7 tablets (2850mby mouth once weekly with breakfast prior to chemotherapy.  Pomalyst start date: 07/03/2020  Adverse effects of Pomalyst include but are not limited to: nausea, constipation, diarrhea, abdominal pain, rash, fatigue, drug fever, peripheral edema, and decreased blood counts.    Reviewed with patient importance of keeping a medication schedule and plan for any missed doses. No barriers to medication adherence identified.  Medication reconciliation performed and medication/allergy list updated.   Patient has picked up acyclovir and stated he is currently taking it daily. He also stated he takes dexamethasone prior to treatment.  Patient counseled on importance of daily aspirin 18m62mr VTE prophylaxis.  Medication education handout placed in mail for patient.  Insurance authorization for [employer] been obtained.  Pomalyst prescription is being dispensed from [facility] is a limited distribution medication.  All questions answered.  Mr. ClinFoutsced understanding and appreciation.   Patient knows to call the office with questions or concerns.  RebeLeron CroakarmD, BCPS Hematology/Oncology Clinical Pharmacist [facility]-[phone]/2021 1:51 PM

## 2020-07-01 ENCOUNTER — Ambulatory Visit: Payer: Medicare Other

## 2020-07-01 ENCOUNTER — Other Ambulatory Visit: Payer: Medicare Other

## 2020-07-06 ENCOUNTER — Other Ambulatory Visit: Payer: Self-pay | Admitting: Radiation Oncology

## 2020-07-06 ENCOUNTER — Ambulatory Visit
Admission: RE | Admit: 2020-07-06 | Discharge: 2020-07-06 | Disposition: A | Discharge status: Home / Self Care | Payer: Self-pay | Source: Ambulatory Visit | Attending: Radiation Oncology | Admitting: Radiation Oncology

## 2020-07-06 DIAGNOSIS — C9000 Multiple myeloma not having achieved remission: Secondary | ICD-10-CM

## 2020-07-09 ENCOUNTER — Inpatient Hospital Stay: Payer: Medicare Other

## 2020-07-09 ENCOUNTER — Inpatient Hospital Stay (HOSPITAL_BASED_OUTPATIENT_CLINIC_OR_DEPARTMENT_OTHER): Payer: Medicare Other | Admitting: Hematology

## 2020-07-09 ENCOUNTER — Other Ambulatory Visit: Payer: Self-pay

## 2020-07-09 ENCOUNTER — Ambulatory Visit: Payer: Medicare Other | Admitting: Hematology

## 2020-07-09 VITALS — BP 139/68 | HR 79 | Temp 99.0°F | Resp 18 | Ht 72.0 in | Wt 245.5 lb

## 2020-07-09 DIAGNOSIS — C9000 Multiple myeloma not having achieved remission: Secondary | ICD-10-CM

## 2020-07-09 DIAGNOSIS — Z7189 Other specified counseling: Secondary | ICD-10-CM

## 2020-07-09 DIAGNOSIS — Z5112 Encounter for antineoplastic immunotherapy: Secondary | ICD-10-CM

## 2020-07-09 DIAGNOSIS — E559 Vitamin D deficiency, unspecified: Secondary | ICD-10-CM

## 2020-07-09 DIAGNOSIS — Z79899 Other long term (current) drug therapy: Secondary | ICD-10-CM | POA: Diagnosis not present

## 2020-07-09 DIAGNOSIS — C9002 Multiple myeloma in relapse: Secondary | ICD-10-CM

## 2020-07-09 LAB — CBC WITH DIFFERENTIAL/PLATELET
Abs Immature Granulocytes: 0.07 10*3/uL (ref 0.00–0.07)
Basophils Absolute: 0 10*3/uL (ref 0.0–0.1)
Basophils Relative: 0 %
Eosinophils Absolute: 0.1 10*3/uL (ref 0.0–0.5)
Eosinophils Relative: 2 %
HCT: 34.4 % — ABNORMAL LOW (ref 39.0–52.0)
Hemoglobin: 11.5 g/dL — ABNORMAL LOW (ref 13.0–17.0)
Immature Granulocytes: 1 %
Lymphocytes Relative: 2 %
Lymphs Abs: 0.1 10*3/uL — ABNORMAL LOW (ref 0.7–4.0)
MCH: 32.9 pg (ref 26.0–34.0)
MCHC: 33.4 g/dL (ref 30.0–36.0)
MCV: 98.3 fL (ref 80.0–100.0)
Monocytes Absolute: 0.2 10*3/uL (ref 0.1–1.0)
Monocytes Relative: 3 %
Neutro Abs: 5.1 10*3/uL (ref 1.7–7.7)
Neutrophils Relative %: 92 %
Platelets: 113 10*3/uL — ABNORMAL LOW (ref 150–400)
RBC: 3.5 MIL/uL — ABNORMAL LOW (ref 4.22–5.81)
RDW: 13.6 % (ref 11.5–15.5)
WBC: 5.6 10*3/uL (ref 4.0–10.5)
nRBC: 0 % (ref 0.0–0.2)

## 2020-07-09 LAB — CMP (CANCER CENTER ONLY)
ALT: 15 U/L (ref 0–44)
AST: 11 U/L — ABNORMAL LOW (ref 15–41)
Albumin: 3.2 g/dL — ABNORMAL LOW (ref 3.5–5.0)
Alkaline Phosphatase: 83 U/L (ref 38–126)
Anion gap: 5 (ref 5–15)
BUN: 8 mg/dL (ref 8–23)
CO2: 27 mmol/L (ref 22–32)
Calcium: 9.4 mg/dL (ref 8.9–10.3)
Chloride: 104 mmol/L (ref 98–111)
Creatinine: 0.79 mg/dL (ref 0.61–1.24)
GFR, Est AFR Am: >60 mL/min (ref >60)
GFR, Estimated: >60 mL/min (ref >60)
Glucose, Bld: 206 mg/dL — ABNORMAL HIGH (ref 70–99)
Potassium: 4 mmol/L (ref 3.5–5.1)
Sodium: 136 mmol/L (ref 135–145)
Total Bilirubin: 0.9 mg/dL (ref 0.3–1.2)
Total Protein: 6.6 g/dL (ref 6.5–8.1)

## 2020-07-09 LAB — VITAMIN D 25 HYDROXY (VIT D DEFICIENCY, FRACTURES): Vit D, 25-Hydroxy: 73.88 ng/mL (ref 30–100)

## 2020-07-09 MED ORDER — SODIUM CHLORIDE 0.9 % IV SOLN: 8.0000 mg | Freq: Once | INTRAVENOUS | Status: DC

## 2020-07-09 MED ORDER — PROCHLORPERAZINE MALEATE 10 MG PO TABS
ORAL_TABLET | ORAL | Status: AC
  Filled 2020-07-09: qty 1

## 2020-07-09 MED ORDER — DIPHENHYDRAMINE HCL 25 MG PO CAPS
ORAL_CAPSULE | ORAL | Status: AC
  Filled 2020-07-09: qty 2

## 2020-07-09 MED ORDER — ZOLEDRONIC ACID 4 MG/100ML IV SOLN
INTRAVENOUS | Status: AC
  Filled 2020-07-09: qty 100

## 2020-07-09 MED ORDER — PROCHLORPERAZINE MALEATE 10 MG PO TABS
10.0000 mg | ORAL_TABLET | Freq: Once | ORAL | Status: AC
  Administered 2020-07-09: 10 mg via ORAL

## 2020-07-09 MED ORDER — DEXAMETHASONE SODIUM PHOSPHATE 10 MG/ML IJ SOLN
INTRAMUSCULAR | Status: AC
  Filled 2020-07-09: qty 1

## 2020-07-09 MED ORDER — DIPHENHYDRAMINE HCL 25 MG PO CAPS
50.0000 mg | ORAL_CAPSULE | Freq: Once | ORAL | Status: AC
  Administered 2020-07-09: 50 mg via ORAL

## 2020-07-09 MED ORDER — SODIUM CHLORIDE 0.9 % IV SOLN
Freq: Once | INTRAVENOUS | Status: AC
  Filled 2020-07-09: qty 250

## 2020-07-09 MED ORDER — SODIUM CHLORIDE 0.9% FLUSH
10.0000 mL | INTRAVENOUS | Status: DC | PRN
  Administered 2020-07-09: 10 mL
  Filled 2020-07-09: qty 10

## 2020-07-09 MED ORDER — ACETAMINOPHEN 325 MG PO TABS
650.0000 mg | ORAL_TABLET | Freq: Once | ORAL | Status: AC
  Administered 2020-07-09: 650 mg via ORAL

## 2020-07-09 MED ORDER — DEXAMETHASONE SODIUM PHOSPHATE 10 MG/ML IJ SOLN
8.0000 mg | Freq: Once | INTRAMUSCULAR | Status: AC
  Administered 2020-07-09: 8 mg via INTRAVENOUS

## 2020-07-09 MED ORDER — FAMOTIDINE IN NACL 20-0.9 MG/50ML-% IV SOLN
INTRAVENOUS | Status: AC
  Filled 2020-07-09: qty 50

## 2020-07-09 MED ORDER — ACETAMINOPHEN 325 MG PO TABS
ORAL_TABLET | ORAL | Status: AC
  Filled 2020-07-09: qty 2

## 2020-07-09 MED ORDER — FAMOTIDINE IN NACL 20-0.9 MG/50ML-% IV SOLN
20.0000 mg | Freq: Once | INTRAVENOUS | Status: AC
  Administered 2020-07-09: 20 mg via INTRAVENOUS

## 2020-07-09 MED ORDER — SODIUM CHLORIDE 0.9 % IV SOLN
10.1000 mg/kg | Freq: Once | INTRAVENOUS | Status: AC
  Administered 2020-07-09: 1100 mg via INTRAVENOUS
  Filled 2020-07-09: qty 32

## 2020-07-09 MED ORDER — HEPARIN SOD (PORK) LOCK FLUSH 100 UNIT/ML IV SOLN
500.0000 [IU] | Freq: Once | INTRAVENOUS | Status: AC | PRN
  Administered 2020-07-09: 500 [IU]
  Filled 2020-07-09: qty 5

## 2020-07-09 NOTE — Patient Instructions (Signed)
[facility] °Discharge Instructions for Patients Receiving Chemotherapy ° °Today you received the following chemotherapy agents: elotuzumab. ° °To help prevent nausea and vomiting after your treatment, we encourage you to take your nausea medication as directed. °  °If you develop nausea and vomiting that is not controlled by your nausea medication, call the clinic.  ° °BELOW ARE SYMPTOMS THAT SHOULD BE REPORTED IMMEDIATELY: °· *FEVER GREATER THAN 100.5 F °· *CHILLS WITH OR WITHOUT FEVER °· NAUSEA AND VOMITING THAT IS NOT CONTROLLED WITH YOUR NAUSEA MEDICATION °· *UNUSUAL SHORTNESS OF BREATH °· *UNUSUAL BRUISING OR BLEEDING °· TENDERNESS IN MOUTH AND THROAT WITH OR WITHOUT PRESENCE OF ULCERS °· *URINARY PROBLEMS °· *BOWEL PROBLEMS °· UNUSUAL RASH °Items with * indicate a potential emergency and should be followed up as soon as possible. ° °Feel free to call the clinic should you have any questions or concerns. The clinic phone number is [phone]. ° °Please show the CHEMO ALERT CARD at check-in to the Emergency Department and triage nurse. ° ° °

## 2020-07-09 NOTE — Progress Notes (Signed)
Per Dr. Irene Limbo, ok to proceed w/ tx before being seeing pt. Dr. Irene Limbo clarified that today should be considered C1D8 (earlier he had said to cancel C1D8)  Kennith Center, Pharm.D., CPP 07/09/2020@2 :30 PM

## 2020-07-09 NOTE — Progress Notes (Signed)
HEMATOLOGY/ONCOLOGY CLINIC NOTE  Date of Service: 07/09/20     Patient Care Team: [name], MD as PCP - General (Family Medicine) [name], MD as Consulting Physician (Hematology) [name], MD as Attending Physician (Radiation Oncology) [name], RN (Inactive) as Oncology Nurse Navigator (Oncology) [name] MD ([facility] _ primary oncology) phone number [phone] Email- [email or web address]  CHIEF COMPLAINTS  followup of multiple myeloma.  HISTORY OF PRESENTING ILLNESS:   Michael [name] is a wonderful 69 y.o. 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 Patient is currently on a study protocol and is being monitored for myeloma recurrence.  Based on available records being put together  -Patient was diagnosed with IgG kappa ISS stage I multiple myeloma with hyperdiploid complex cytogenetics in 2012. Bone marrow biopsy apparently showed 50% kappa restricted plasma cells with an initial M spike of 3.4 g/dL with evidence of lytic lesions on his PET/CT scan including right rib fracture and lesions of the lumbar and cervical spine. -Patient was treated under protocol 11-C-0221 with from 02/21/2013 with  Carfilzomib/Revlimid/Dexamethasone for a total of 8 cycles. He reports that his stem cells were collected and stored after 4 cycles -Posttreatment bone marrow biopsy showed less than 5% plasma cells with a negative flow cytometry and improvement in his previously PET avid lesions. Some concern for new focus of activity at C2 lytic lesion at L4 and several small lesions throughout the vertebrae. -Patient was on maintenance lenalidomide 10  mg by mouth daily for 2 years or more until end of 2014. 08/19/2013 - bone marrow biopsy showed normocellular marrow with less than 5% plasma cells. PET CT scan showed decreased focal metabolic ligament prior rib lesions and no new lesions. Flow cytometry was negative for residual disease. Serum and urine electrophoresis and IFE showed no evidence of monoclonal protein. -Patient notes that he was off protocol for a few years due to lack of clinical trial research funding. -Patient notes that he is back on a follow-up protocol and continues to follow and receive his primary treatment at [facility] at this time. -Patient reports he had his last PET CT scan blood and urine tests and bone marrow examination in February 2018. The results of which are not available to [place] currently.  He notes that there was concern for a very slowly growing sternal FDG avid lesion that has been present for years. He has a follow-up at [facility] next month to determine the management of this lesion. He reports that was some consideration of considering radiation. he notes that this lesion has not been biopsied. No other focal bone pains at this time.  We discussed in detail about what our role will be and noted that we shall be available for any acute issues that need to be taken care of locally but that at this time the primary treatment and evaluation will be driven by his team at [facility] as per their research protocols and input. The patient and his wife are clearly aware of this and are in agreement with this plan.  INTERVAL HISTORY [name] is here for continued management of his Multiple Myeloma. He is here for C1D8 Elotuzumab. The patient's last visit with [place]  was on 06/26/2020. The pt reports that he is doing well overall.  The pt reports he continues having pain in the back of his shoulders and hips. Pt has several painful cysts and a burning sensation along his back. He is experiencing underarm and chest  discomfort.  Pt began taking Pomalidomide last Friday and denies any issues with the medication. He continues to take a daily Aspirin.    He is not taking Cymbalta and has not required any Oxycodone yet. He is working on improving his water intake.   Lab results today (07/09/20) of CBC w/diff and CMP is as follows: all values are WNL except for RBC at 3.50, Hgb at 11.5, HCT at 34.4, PLT at 113K, Lymphs Abs at 0.1K, Glucose at 206, Albumin at 3.2, AST at 11.  On review of systems, pt reports shoulder pain, hip pain, underarm pain, chest pain and denies headaches, N/V/D, leg swelling, rash, palpitations, angina, sleeplessness, constipation and any other symptoms.   MEDICAL HISTORY:  Past Medical History:  Diagnosis Date  . Angioedema   . CPAP (continuous positive airway pressure) dependence    uses at night  . Gastroesophageal reflux disease without esophagitis   . History of radiation therapy 06/04/18- 06/15/18   25 Gy in 10 fractions to the laryngeal/ cartilage lesion  . Hyperlipidemia   . Multiple myeloma ([place]) 2012   Treated at [facility]  . Non morbid obesity due to excess calories   . Prediabetes   . Prediabetes   . Unspecified glaucoma(365.9)   . Urticaria, unspecified   Diabetes mellitus Glaucoma Left toes neuropathy  GERD  SURGICAL HISTORY: Past Surgical History:  Procedure Laterality Date  . IR IMAGING GUIDED PORT INSERTION  06/05/2020  . IR RADIOLOGIST EVAL & MGMT  11/13/2018  . PORTACATH PLACEMENT    . PORTACATH PLACEMENT     removed 02/2016    SOCIAL HISTORY: Social History   Socioeconomic History  . Marital status: Married    Spouse name: Not on file  . Number of children: 2  . Years of education: Not on file  . Highest education level: Not on file  Occupational History  . Occupation: Retired Chief Strategy Officer for [employer]  . Smoking status: Never Smoker  . Smokeless tobacco: Never Used  Vaping Use  . Vaping Use: Never used  Substance and Sexual  Activity  . Alcohol use: Yes    Alcohol/week: 0.0 standard drinks    Comment: Occasional beer  . Drug use: No  . Sexual activity: Not Currently  Other Topics Concern  . Not on file  Social History Narrative   Unable to [employer] partner violence- partner in room    Social Determinants of Health   Financial Resource Strain:   . Difficulty of Paying Living Expenses: Not on file  Food Insecurity:   . Worried About Charity fundraiser in the Last Year: Not on file  . Ran Out of Food in the Last Year: Not on file  Transportation Needs:   . Lack of Transportation (Medical): Not on file  . Lack of Transportation (Non-Medical): Not on file  Physical Activity:   . Days of Exercise per Week: Not on file  . Minutes of Exercise per Session: Not on file  Stress:   . Feeling of Stress : Not on file  Social Connections:   . Frequency of Communication with Friends and Family: Not on file  . Frequency of Social Gatherings with Friends and Family: Not  on file  . Attends Religious Services: Not on file  . Active Member of Clubs or Organizations: Not on file  . Attends Archivist Meetings: Not on file  . Marital Status: Not on file  Intimate Partner Violence:   . Fear of Current or Ex-Partner: Not on file  . Emotionally Abused: Not on file  . Physically Abused: Not on file  . Sexually Abused: Not on file  Never smoker Married Retired from the [employer] in June 2017 and moved to [facility].  FAMILY HISTORY: Family History  Problem Relation Age of Onset  . Cancer Father     ALLERGIES:  has No Known Allergies.  MEDICATIONS:  Current Outpatient Medications  Medication Sig Dispense Refill  . acyclovir (ZOVIRAX) 400 MG tablet Take 1 tablet (400 mg total) by mouth 2 (two) times daily. 60 tablet 11  . aspirin 81 MG tablet Take 81 mg by mouth daily.    [name] atorvastatin (LIPITOR) 20 MG tablet Take 20 mg by mouth every evening.     . Calcium Citrate-Vitamin D  (CALCIUM CITRATE + D3 MAXIMUM PO) Take 630 mg by mouth.     . dexamethasone (DECADRON) 4 MG tablet Take 7 tabs (29m) between 3 & 24 hours prior to chemo. 28 tablet 2  . ergocalciferol (VITAMIN D2) 1.25 MG (50000 UT) capsule Take 1 capsule (50,000 Units total) by mouth 2 (two) times a week. 24 capsule 3  . gabapentin (NEURONTIN) 300 MG capsule Take 2 capsules (600 mg total) by mouth 3 (three) times daily. 180 capsule 2  . glucose blood (FREESTYLE LITE) test strip     . lidocaine-prilocaine (EMLA) cream Apply topically to port area prior to port access 30 g 3  . LORazepam (ATIVAN) 0.5 MG tablet Take 1 tablet (0.5 mg total) by mouth every 6 (six) hours as needed (Nausea or vomiting). 30 tablet 0  . Multiple Vitamin (MULTIVITAMIN) tablet Take 1 tablet by mouth daily.    . ondansetron (ZOFRAN) 8 MG tablet Take 1 tablet (8 mg total) by mouth 2 (two) times daily as needed (Nausea or vomiting). 30 tablet 1  . oxyCODONE (OXY IR/ROXICODONE) 5 MG immediate release tablet Take 1-2 tablets (5-10 mg total) by mouth every 4 (four) hours as needed for severe pain or breakthrough pain. 60 tablet 0  . pantoprazole (PROTONIX) 40 MG tablet Take 40 mg by mouth daily.     . pomalidomide (POMALYST) 2 MG capsule Take 1 capsule (2 mg total) by mouth daily. Take 21 days on, 7 days off, repeat every 28 days. 21 capsule 1  . prochlorperazine (COMPAZINE) 10 MG tablet Take 1 tablet (10 mg total) by mouth every 6 (six) hours as needed (Nausea or vomiting). 30 tablet 1  . senna-docusate (SENOKOT-S) 8.6-50 MG tablet Take 2 tablets by mouth at bedtime. Takes 50 mg (Patient not taking: Reported on 12/17/2019) 60 tablet 3   No current facility-administered medications for this visit.   Facility-Administered Medications Ordered in Other Visits  Medication Dose Route Frequency Provider Last Rate Last Admin  . heparin lock flush 100 unit/mL  500 Units Intracatheter Once PRN FTruitt Merle MD      . sodium chloride flush (NS) 0.9 %  injection 10 mL  10 mL Intracatheter PRN FTruitt Merle MD        REVIEW OF SYSTEMS:   A 10+ POINT REVIEW OF SYSTEMS WAS OBTAINED including neurology, dermatology, psychiatry, cardiac, respiratory, lymph, extremities, GI, GU, Musculoskeletal, constitutional, breasts, reproductive,  HEENT.  All pertinent positives are noted in the HPI.  All others are negative.   PHYSICAL EXAMINATION: ECOG PERFORMANCE STATUS: 1 - Symptomatic but completely ambulatory  There were no vitals filed for this visit. There is no height or weight on file to calculate BMI.   GENERAL:alert, in no acute distress and comfortable SKIN: no acute rashes, no significant lesions EYES: conjunctiva are pink and non-injected, sclera anicteric OROPHARYNX: MMM, no exudates, no oropharyngeal erythema or ulceration NECK: supple, no JVD LYMPH:  no palpable lymphadenopathy in the cervical, axillary or inguinal regions LUNGS: clear to auscultation b/l with normal respiratory effort HEART: regular rate & rhythm ABDOMEN:  normoactive bowel sounds , non tender, not distended. No palpable hepatosplenomegaly.  Extremity: no pedal edema PSYCH: alert & oriented x 3 with fluent speech NEURO: no focal motor/sensory deficits  LABORATORY DATA:  I have reviewed the data as listed . CBC Latest Ref Rng & Units 07/09/2020 06/26/2020 06/17/2020  WBC 4.0 - 10.5 K/uL 5.6 7.0 5.0  Hemoglobin 13.0 - 17.0 g/dL 11.5(L) 12.0(L) 11.9(L)  Hematocrit 39 - 52 % 34.4(L) 35.4(L) 35.7(L)  Platelets 150 - 400 K/uL 113(L) 147(L) 249    . CMP Latest Ref Rng & Units 07/09/2020 06/26/2020 06/17/2020  Glucose 70 - 99 mg/dL 206(H) 130(H) 120(H)  BUN 8 - 23 mg/dL 8 14 12   Creatinine 0.61 - 1.24 mg/dL 0.79 0.78 0.83  Sodium 135 - 145 mmol/L 136 137 138  Potassium 3.5 - 5.1 mmol/L 4.0 4.3 4.2  Chloride 98 - 111 mmol/L 104 105 105  CO2 22 - 32 mmol/L 27 27 29   Calcium 8.9 - 10.3 mg/dL 9.4 8.8(L) 10.3  Total Protein 6.5 - 8.1 g/dL 6.6 6.7 7.7  Total Bilirubin 0.3 - 1.2  mg/dL 0.9 1.0 1.4(H)  Alkaline Phos 38 - 126 U/L 83 51 74  AST 15 - 41 U/L 11(L) 7(L) 11(L)  ALT 0 - 44 U/L 15 11 7    06/04/2020 BM Report:   06/04/2020 Flow Cytometry:   08/16/2019 BM Bx Report ([id number]):   08/16/2019 FISH Panel:      05/08/18 Left Neck Thyroid Cartilage Biopsy:        RADIOGRAPHIC STUDIES: I have personally reviewed the radiological images as listed and agreed with the findings in the report. MR Brain W Wo Contrast  Result Date: 06/24/2020 CLINICAL DATA:  Multiple myeloma post radiation EXAM: MRI HEAD WITHOUT AND WITH CONTRAST TECHNIQUE: Multiplanar, multiecho pulse sequences of the brain and surrounding structures were obtained without and with intravenous contrast. CONTRAST:  105m GADAVIST GADOBUTROL 1 MMOL/ML IV SOLN COMPARISON:  None. FINDINGS: Brain: There is no acute infarction or intracranial hemorrhage. There is no parenchymal mass, mass effect, or edema. There is no hydrocephalus or extra-axial fluid collection. Ventricles and sulci are within normal limits in size and configuration. A few scattered small foci of T2 hyperintensity in the supratentorial white matter are nonspecific and may reflect minor chronic microvascular ischemic changes. No abnormal parenchymal enhancement. Vascular: Major vessel flow voids at the skull base are preserved. Skull and upper cervical spine: Lesion spanning the midline occipital calvarium with bony destruction measures approximately 1.5 x 4.5 x 4.8 cm. Mild extraosseous extension is present into the scalp and intracranially. This abuts the posterior portion of the superior sagittal sinus without significant compression or invasion. There is additional suspected abnormal marrow signal and enhancement of the clivus adjacent to the posterior floor of the left sphenoid sinus. Contiguous enhancing tissue within the sinus is noted. Sinuses/Orbits:  As above. Otherwise minor mucosal thickening. No significant orbital  abnormality. Other: Sella is unremarkable.  Mastoid air cells are clear. IMPRESSION: Myelomatous lesion spanning the midline occipital calvarium with bony destruction. There is mild epidural extension abutting the superior sagittal sinus without invasion or significant compression. Suspected additional myelomatous involvement of the clivus adjacent to the posterior floor of the left sphenoid sinus. Contiguous enhancing tissue within the sinus could reflect extraosseous extension of disease or unrelated sinus inflammatory disease. Electronically Signed   By: Macy Mis M.D.   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 Bone marrow biopsy apparently showed 50% kappa restricted plasma cells with an initial M spike of 3.4 g/dL with evidence of lytic lesions on his PET/CT scan including right rib fracture and lesions of the lumbar and cervical spine. -Patient was treated under protocol 11-C-0221 with from 02/21/2013 with  Carfilzomib/Revlimid/Dexamethasone for a total of 8 cycles. He reports that his stem cells were collected and stored after 4 cycles -Posttreatment bone marrow biopsy showed less than 5% plasma cells with a negative flow cytometry and improvement in his previously PET avid lesions. Some concern for new focus of activity at C2 lytic lesion at L4 and several small lesions throughout the vertebrae. -Patient was on maintenance lenalidomide 10 mg by mouth daily for 2 years or more until end of 2014. 08/19/2013 - bone marrow biopsy showed normocellular marrow with less than 5% plasma cells. PET CT scan showed decreased focal metabolic ligament prior rib lesions and no new lesions. Flow cytometry was negative for residual disease. Serum and urine electrophoresis and IFE showed no evidence of monoclonal  protein. -Patient notes that he was off protocol for a few years due to lack of clinical trial research funding. -Patient returned to follow-up protocol and began to follow and receive his primary treatment at [facility]  -Patient's labs show minimal IgG kappa protein on IFE and a CT chest in May 2018 that did not show any overt signs of myeloma progression in the bones.  Rpt Myeloma labs 03/2017 - M spike of 0.3g/dl with a repeat M spike stable at 0.3 g/dL on 05/23/2017 which remains stable on labs today 07/25/17 Serum kappa lambda free light chain ratio not significantly changed. PET/CT scan showed a single metabolically active sternal lesion without any overt bony destruction. Bone marrow biopsy done on 06/06/2017 - shows no overt involvement with multiple myeloma.  04/24/18 CT Soft Tissue Neck revealed Left laryngeal 3 cm soft tissue mass appears to have originated within and is expanding the left thyroid cartilage. This is new since the August 2018 PET-CT, and there is absent lymphadenopathy. 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 Primary cartilaginous neoplasm, squamous cell carcinoma, and other differential considerations are less likely. Superimposed widespread multiple myeloma lesions in the visible skeleton.  05/08/18 Left neck thyroid cartilage biopsy revealed Plasma cell neoplasm.  05/22/18 PET/CT revealed Soft tissue mass centered around the destroyed left thyroid cartilage is hypermetabolic and consistent with known plasmacytoma. No adenopathy in the neck. 2. Hypermetabolic 3 cm cutaneous and subcutaneous soft tissue mass involving the posterior upper thorax suspicious for cutaneous manifestation of myeloma (plasmacytoma). 3. Persistent and slightly progressive hypermetabolic sternal lesion. 4. Diffuse lytic myelomatous lesions throughout the bony structures but no other hypermetabolic foci. 5. Focus  of hypermetabolism in the left aspect of the lower prostate  gland, not present on the prior study. This could be an area of infection/inflammation or potential prostate cancer. Recommend correlation with physical examination and PSA level.    Completed RT between 06/04/18 and 06/15/18 of 25 Gy by 10 fractions  The pt pursued a clinical trial at the [facility] with RT and immunotherapy in October 2019 through October 18, 2018 with further information available at [email or web address]  10/18/18 M Protein increased to 3.5g, from 0.7g at our last visit on 07/10/18. 10/02/18 PET/CT from [facility] indicated new hypermetabolism in left clavicle, left scapula, bilateral ribs, spine, and left proximal femur 10/18/18 MRI from [facility] ndicated a pathologic fracture at T12  10/2018 M Protein at 4.1g  11/23/18 NM Bone revealed Patchy uptake throughout the ribs and spine with areas of focally increased activity in both T12 pedicles and the right L1 pedicle. This focal activity could be stress related without clear corresponding fracture on available prior studies.  08/16/2019 FISH Panel which revealed "No mutations detected."  08/16/2019 [facility] Pathology [phone]) which revealed  "DIAGNOSIS: BONE MARROW, ASPIRATE, CLOT, CORE: -Hypercellular bone marrow for age with trilineage hematopoiesis and less than 1% plasma cells PERIPHERAL BLOOD: -Macrocytic anemia -Leukocytosis ".  09/03/2019 PET/CT scan ([id number]) revealed  "1. No evidence of active multiple myeloma on whole-body FDG PET scan. 2. Stable lytic lesions within the spine and sternum consistent treated metastasis. 3. No soft tissue plasmacytoma. 4. New compression fracture at T12 compared to PET-CT scan 10/02/2018."  06/24/2020 MRI Brain ([id number]) revealed "Myelomatous lesion spanning the midline occipital calvarium with bony destruction. There is mild epidural extension abutting the superior sagittal sinus without invasion or significant compression. Suspected additional myelomatous involvement of  the clivus adjacent to the posterior floor of the left sphenoid sinus. Contiguous enhancing tissue within the sinus could reflect extraosseous extension of disease or unrelated sinus inflammatory disease."  2) h/o Elevated bilirubin level . ? [place]  3) PET/CT abnormality in the Prostate. PSA levels WNL -Primary care physician to consider urology referral  4) Abnormal focus of FDG avid at a spot in the liver - will need to be monitor on f/u scans. No pain or other symptoms at this time.  PLAN:  -Discussed pt labwork today, 07/09/20; all values are WNL except for RBC at 3.50, Hgb at 11.5, HCT at 34.4, PLT at 113K, Lymphs Abs at 0.1K, Glucose at 206, Albumin at 3.2, AST at 11. -The pt has no prohibitive toxicities from continuing C1D8 Elotuzumab at this time.  -Continue Pomalidomide 3 weeks on/1 week off. Pt has no prohibitive toxicities at this time. -Advised pt that the side effects from radiation should continue to improve with time.  -Recommended that the pt continue to eat well, drink at least 48-64 oz of water each day, and walk 20-30 minutes each day.   -Continue twice weekly 50K UT Vitamin D -Continue 600 mg Gabapentin TID   -Continue Zometa q23month -Will see back in 2 weeks with labs   FOLLOW UP: F/u as scheduled for C1D15 and C1D22 with portflush and labs plz schedule C2 of Elotuzumab as ordered weekly x 4 with portflush and labs MD visit  with C2D1    The total time spent in the appt was 20 minutes and more than 50% was on counseling and direct patient cares.  All of the patient's questions were answered with apparent satisfaction. The patient knows to call the clinic with any problems, questions or concerns.  Sullivan Lone MD [place] AAHIVMS [facility] Hematology/Oncology Physician [facility]  (Office):       [phone] (Work cell):  [phone] (Fax):           [phone]  I, Yevette Edwards, am acting as a scribe for Dr. Sullivan Lone.   .I have  reviewed the above documentation for accuracy and completeness, and I agree with the above. [name] MD

## 2020-07-17 ENCOUNTER — Inpatient Hospital Stay: Payer: Medicare Other

## 2020-07-17 ENCOUNTER — Other Ambulatory Visit: Payer: Self-pay

## 2020-07-17 ENCOUNTER — Inpatient Hospital Stay: Payer: Medicare Other | Attending: Hematology

## 2020-07-17 VITALS — BP 136/79 | HR 84 | Temp 98.6°F | Resp 18

## 2020-07-17 DIAGNOSIS — Z79899 Other long term (current) drug therapy: Secondary | ICD-10-CM | POA: Diagnosis not present

## 2020-07-17 DIAGNOSIS — Z5112 Encounter for antineoplastic immunotherapy: Secondary | ICD-10-CM | POA: Diagnosis not present

## 2020-07-17 DIAGNOSIS — C9000 Multiple myeloma not having achieved remission: Secondary | ICD-10-CM | POA: Insufficient documentation

## 2020-07-17 DIAGNOSIS — E559 Vitamin D deficiency, unspecified: Secondary | ICD-10-CM

## 2020-07-17 DIAGNOSIS — Z7189 Other specified counseling: Secondary | ICD-10-CM

## 2020-07-17 LAB — CBC WITH DIFFERENTIAL/PLATELET
Abs Immature Granulocytes: 0.14 10*3/uL — ABNORMAL HIGH (ref 0.00–0.07)
Basophils Absolute: 0 10*3/uL (ref 0.0–0.1)
Basophils Relative: 1 %
Eosinophils Absolute: 0 10*3/uL (ref 0.0–0.5)
Eosinophils Relative: 0 %
HCT: 37.3 % — ABNORMAL LOW (ref 39.0–52.0)
Hemoglobin: 12.6 g/dL — ABNORMAL LOW (ref 13.0–17.0)
Immature Granulocytes: 4 %
Lymphocytes Relative: 4 %
Lymphs Abs: 0.2 10*3/uL — ABNORMAL LOW (ref 0.7–4.0)
MCH: 32.4 pg (ref 26.0–34.0)
MCHC: 33.8 g/dL (ref 30.0–36.0)
MCV: 95.9 fL (ref 80.0–100.0)
Monocytes Absolute: 0.1 10*3/uL (ref 0.1–1.0)
Monocytes Relative: 3 %
Neutro Abs: 3.1 10*3/uL (ref 1.7–7.7)
Neutrophils Relative %: 88 %
Platelets: 203 10*3/uL (ref 150–400)
RBC: 3.89 MIL/uL — ABNORMAL LOW (ref 4.22–5.81)
RDW: 13.4 % (ref 11.5–15.5)
WBC: 3.5 10*3/uL — ABNORMAL LOW (ref 4.0–10.5)
nRBC: 0 % (ref 0.0–0.2)

## 2020-07-17 LAB — CMP (CANCER CENTER ONLY)
ALT: 18 U/L (ref 0–44)
AST: 14 U/L — ABNORMAL LOW (ref 15–41)
Albumin: 3.4 g/dL — ABNORMAL LOW (ref 3.5–5.0)
Alkaline Phosphatase: 92 U/L (ref 38–126)
Anion gap: 7 (ref 5–15)
BUN: 14 mg/dL (ref 8–23)
CO2: 26 mmol/L (ref 22–32)
Calcium: 9.6 mg/dL (ref 8.9–10.3)
Chloride: 103 mmol/L (ref 98–111)
Creatinine: 0.94 mg/dL (ref 0.61–1.24)
GFR, Est AFR Am: >60 mL/min (ref >60)
GFR, Estimated: >60 mL/min (ref >60)
Glucose, Bld: 181 mg/dL — ABNORMAL HIGH (ref 70–99)
Potassium: 4.5 mmol/L (ref 3.5–5.1)
Sodium: 136 mmol/L (ref 135–145)
Total Bilirubin: 1.3 mg/dL — ABNORMAL HIGH (ref 0.3–1.2)
Total Protein: 7.4 g/dL (ref 6.5–8.1)

## 2020-07-17 LAB — VITAMIN D 25 HYDROXY (VIT D DEFICIENCY, FRACTURES): Vit D, 25-Hydroxy: 90.18 ng/mL (ref 30–100)

## 2020-07-17 MED ORDER — ACETAMINOPHEN 325 MG PO TABS
ORAL_TABLET | ORAL | Status: AC
  Filled 2020-07-17: qty 2

## 2020-07-17 MED ORDER — DEXAMETHASONE SODIUM PHOSPHATE 10 MG/ML IJ SOLN
8.0000 mg | Freq: Once | INTRAMUSCULAR | Status: AC
  Administered 2020-07-17: 8 mg via INTRAVENOUS

## 2020-07-17 MED ORDER — SODIUM CHLORIDE 0.9 % IV SOLN
10.1000 mg/kg | Freq: Once | INTRAVENOUS | Status: AC
  Administered 2020-07-17: 1100 mg via INTRAVENOUS
  Filled 2020-07-17: qty 32

## 2020-07-17 MED ORDER — DIPHENHYDRAMINE HCL 25 MG PO CAPS
ORAL_CAPSULE | ORAL | Status: AC
  Filled 2020-07-17: qty 2

## 2020-07-17 MED ORDER — ACETAMINOPHEN 325 MG PO TABS
650.0000 mg | ORAL_TABLET | Freq: Once | ORAL | Status: AC
  Administered 2020-07-17: 650 mg via ORAL

## 2020-07-17 MED ORDER — FAMOTIDINE IN NACL 20-0.9 MG/50ML-% IV SOLN
INTRAVENOUS | Status: AC
  Filled 2020-07-17: qty 50

## 2020-07-17 MED ORDER — DIPHENHYDRAMINE HCL 25 MG PO CAPS
50.0000 mg | ORAL_CAPSULE | Freq: Once | ORAL | Status: AC
  Administered 2020-07-17: 50 mg via ORAL

## 2020-07-17 MED ORDER — FAMOTIDINE IN NACL 20-0.9 MG/50ML-% IV SOLN
20.0000 mg | Freq: Once | INTRAVENOUS | Status: AC
  Administered 2020-07-17: 20 mg via INTRAVENOUS

## 2020-07-17 MED ORDER — PROCHLORPERAZINE MALEATE 10 MG PO TABS
ORAL_TABLET | ORAL | Status: AC
  Filled 2020-07-17: qty 1

## 2020-07-17 MED ORDER — PROCHLORPERAZINE MALEATE 10 MG PO TABS
10.0000 mg | ORAL_TABLET | Freq: Once | ORAL | Status: AC
  Administered 2020-07-17: 10 mg via ORAL

## 2020-07-17 MED ORDER — HEPARIN SOD (PORK) LOCK FLUSH 100 UNIT/ML IV SOLN
500.0000 [IU] | Freq: Once | INTRAVENOUS | Status: AC | PRN
  Administered 2020-07-17: 500 [IU]
  Filled 2020-07-17: qty 5

## 2020-07-17 MED ORDER — SODIUM CHLORIDE 0.9% FLUSH
10.0000 mL | INTRAVENOUS | Status: DC | PRN
  Administered 2020-07-17: 10 mL
  Filled 2020-07-17: qty 10

## 2020-07-17 MED ORDER — DEXAMETHASONE SODIUM PHOSPHATE 10 MG/ML IJ SOLN
INTRAMUSCULAR | Status: AC
  Filled 2020-07-17: qty 1

## 2020-07-20 LAB — MULTIPLE MYELOMA PANEL, SERUM
Albumin SerPl Elph-Mcnc: 3.9 g/dL (ref 2.9–4.4)
Albumin/Glob SerPl: 1.5 (ref 0.7–1.7)
Alpha 1: 0.3 g/dL (ref 0.0–0.4)
Alpha2 Glob SerPl Elph-Mcnc: 0.7 g/dL (ref 0.4–1.0)
B-Globulin SerPl Elph-Mcnc: 1 g/dL (ref 0.7–1.3)
Gamma Glob SerPl Elph-Mcnc: 0.8 g/dL (ref 0.4–1.8)
Globulin, Total: 2.7 g/dL (ref 2.2–3.9)
IgA: 9 mg/dL — ABNORMAL LOW (ref 61–437)
IgG (Immunoglobin G), Serum: 917 mg/dL (ref 603–1613)
IgM (Immunoglobulin M), Srm: <5 mg/dL — ABNORMAL LOW (ref 20–172)
M Protein SerPl Elph-Mcnc: 0.6 g/dL — ABNORMAL HIGH
Total Protein ELP: 6.6 g/dL (ref 6.0–8.5)

## 2020-07-23 ENCOUNTER — Encounter: Payer: Self-pay | Admitting: Hematology

## 2020-07-23 ENCOUNTER — Other Ambulatory Visit: Payer: Self-pay | Admitting: Hematology

## 2020-07-23 DIAGNOSIS — C9000 Multiple myeloma not having achieved remission: Secondary | ICD-10-CM

## 2020-07-23 DIAGNOSIS — Z7189 Other specified counseling: Secondary | ICD-10-CM

## 2020-07-23 NOTE — Progress Notes (Signed)
Mr. Ryle returns today for follow-up of radiation to his scalp and posterior skull, upper and mid thoracic spine, and to the right iliac wing completed on 06/24/2020  Pain: Occasional right hip pain. Place behind right ear improved. Continued pain from axilla, across chest (along nipple line). Posterior skull area improved but reports more prominent indentation. Also reports a random pain/spasm in sternal area that radiates outwards (usually precipitated by movement). States it is very jarring (causes a "clenching" response), but it often resolves quickly Neuro: Denies any headaches, blurry vision, auditory changes, slurred speech, or memory issues. Appetite/Swallowing issues:  Wt Readings from Last 3 Encounters:  07/24/20 241 lb 4 oz (109.4 kg)  07/09/20 245 lb 8 oz (111.4 kg)  06/26/20 239 lb 12.8 oz (108.8 kg)   Mobility: Reports more difficulty getting into/out of the car. Able to ambulate without assistance, but does have a slow gait. Reports neuropathy in right hand and bilateral feet.   Other notable issues:  Dr. Sullivan Lone  07/09/2020 PLAN:  -Discussed pt labwork today, 07/09/20; all values are WNL except for RBC at 3.50, Hgb at 11.5, HCT at 34.4, PLT at 113K, Lymphs Abs at 0.1K, Glucose at 206, Albumin at 3.2, AST at 11. -The pt has no prohibitive toxicities from continuing C1D8 Elotuzumab at this time.  -Continue Pomalidomide 3 weeks on/1 week off. Pt has no prohibitive toxicities at this time. -Advised pt that the side effects from radiation should continue to improve with time.  -Recommended that the pt continue to eat well, drink at least 48-64 oz of water each day, and walk 20-30 minutes each day.   -Continue twice weekly 50K UT Vitamin D -Continue 600 mg Gabapentin TID   -Continue Zometa q30months -Will see back in 2 weeks with labs  Vitals:   07/24/20 1445  BP: (!) 145/77  Pulse: 78  Resp: 20  Temp: 97.9 F (36.6 C)  SpO2: 98%

## 2020-07-24 ENCOUNTER — Inpatient Hospital Stay: Payer: Medicare Other

## 2020-07-24 ENCOUNTER — Other Ambulatory Visit: Payer: Self-pay | Admitting: Medical

## 2020-07-24 ENCOUNTER — Inpatient Hospital Stay (HOSPITAL_BASED_OUTPATIENT_CLINIC_OR_DEPARTMENT_OTHER): Payer: Medicare Other | Admitting: Medical

## 2020-07-24 ENCOUNTER — Other Ambulatory Visit: Payer: Self-pay

## 2020-07-24 ENCOUNTER — Ambulatory Visit
Admission: RE | Admit: 2020-07-24 | Discharge: 2020-07-24 | Disposition: A | Discharge status: Home / Self Care | Source: Ambulatory Visit | Attending: Radiation Oncology | Admitting: Radiation Oncology

## 2020-07-24 ENCOUNTER — Encounter: Payer: Self-pay | Admitting: Radiation Oncology

## 2020-07-24 VITALS — BP 129/98 | HR 77 | Temp 98.3°F | Resp 20

## 2020-07-24 VITALS — BP 145/77 | HR 78 | Temp 97.9°F | Resp 20 | Wt 241.2 lb

## 2020-07-24 DIAGNOSIS — M25551 Pain in right hip: Secondary | ICD-10-CM | POA: Insufficient documentation

## 2020-07-24 DIAGNOSIS — Z7189 Other specified counseling: Secondary | ICD-10-CM

## 2020-07-24 DIAGNOSIS — Z79899 Other long term (current) drug therapy: Secondary | ICD-10-CM | POA: Diagnosis not present

## 2020-07-24 DIAGNOSIS — C9000 Multiple myeloma not having achieved remission: Secondary | ICD-10-CM

## 2020-07-24 DIAGNOSIS — R072 Precordial pain: Secondary | ICD-10-CM

## 2020-07-24 DIAGNOSIS — R0789 Other chest pain: Secondary | ICD-10-CM

## 2020-07-24 DIAGNOSIS — Z5112 Encounter for antineoplastic immunotherapy: Secondary | ICD-10-CM | POA: Diagnosis not present

## 2020-07-24 DIAGNOSIS — C9002 Multiple myeloma in relapse: Secondary | ICD-10-CM

## 2020-07-24 DIAGNOSIS — Z7982 Long term (current) use of aspirin: Secondary | ICD-10-CM | POA: Insufficient documentation

## 2020-07-24 LAB — CBC WITH DIFFERENTIAL/PLATELET
Abs Immature Granulocytes: 0.21 10*3/uL — ABNORMAL HIGH (ref 0.00–0.07)
Basophils Absolute: 0.1 10*3/uL (ref 0.0–0.1)
Basophils Relative: 2 %
Eosinophils Absolute: 0.1 10*3/uL (ref 0.0–0.5)
Eosinophils Relative: 1 %
HCT: 37.1 % — ABNORMAL LOW (ref 39.0–52.0)
Hemoglobin: 12.3 g/dL — ABNORMAL LOW (ref 13.0–17.0)
Immature Granulocytes: 5 %
Lymphocytes Relative: 3 %
Lymphs Abs: 0.1 10*3/uL — ABNORMAL LOW (ref 0.7–4.0)
MCH: 32.5 pg (ref 26.0–34.0)
MCHC: 33.2 g/dL (ref 30.0–36.0)
MCV: 98.1 fL (ref 80.0–100.0)
Monocytes Absolute: 0.1 10*3/uL (ref 0.1–1.0)
Monocytes Relative: 3 %
Neutro Abs: 3.3 10*3/uL (ref 1.7–7.7)
Neutrophils Relative %: 86 %
Platelets: 206 10*3/uL (ref 150–400)
RBC: 3.78 MIL/uL — ABNORMAL LOW (ref 4.22–5.81)
RDW: 13.7 % (ref 11.5–15.5)
WBC: 3.9 10*3/uL — ABNORMAL LOW (ref 4.0–10.5)
nRBC: 0 % (ref 0.0–0.2)

## 2020-07-24 LAB — CMP (CANCER CENTER ONLY)
ALT: 15 U/L (ref 0–44)
AST: 9 U/L — ABNORMAL LOW (ref 15–41)
Albumin: 3.4 g/dL — ABNORMAL LOW (ref 3.5–5.0)
Alkaline Phosphatase: 79 U/L (ref 38–126)
Anion gap: 8 (ref 5–15)
BUN: 13 mg/dL (ref 8–23)
CO2: 27 mmol/L (ref 22–32)
Calcium: 9.4 mg/dL (ref 8.9–10.3)
Chloride: 104 mmol/L (ref 98–111)
Creatinine: 0.86 mg/dL (ref 0.61–1.24)
GFR, Estimated: >60 mL/min (ref >60)
Glucose, Bld: 292 mg/dL — ABNORMAL HIGH (ref 70–99)
Potassium: 3.8 mmol/L (ref 3.5–5.1)
Sodium: 139 mmol/L (ref 135–145)
Total Bilirubin: 1 mg/dL (ref 0.3–1.2)
Total Protein: 6.6 g/dL (ref 6.5–8.1)

## 2020-07-24 MED ORDER — DEXAMETHASONE SODIUM PHOSPHATE 10 MG/ML IJ SOLN
8.0000 mg | Freq: Once | INTRAMUSCULAR | Status: AC
  Administered 2020-07-24: 8 mg via INTRAVENOUS

## 2020-07-24 MED ORDER — FAMOTIDINE IN NACL 20-0.9 MG/50ML-% IV SOLN
20.0000 mg | Freq: Once | INTRAVENOUS | Status: AC
  Administered 2020-07-24: 20 mg via INTRAVENOUS

## 2020-07-24 MED ORDER — SODIUM CHLORIDE 0.9% FLUSH
10.0000 mL | INTRAVENOUS | Status: DC | PRN
  Administered 2020-07-24: 10 mL
  Filled 2020-07-24: qty 10

## 2020-07-24 MED ORDER — DIPHENHYDRAMINE HCL 25 MG PO CAPS
50.0000 mg | ORAL_CAPSULE | Freq: Once | ORAL | Status: AC
  Administered 2020-07-24: 50 mg via ORAL

## 2020-07-24 MED ORDER — DEXAMETHASONE SODIUM PHOSPHATE 10 MG/ML IJ SOLN
INTRAMUSCULAR | Status: AC
  Filled 2020-07-24: qty 1

## 2020-07-24 MED ORDER — PROCHLORPERAZINE MALEATE 10 MG PO TABS
10.0000 mg | ORAL_TABLET | Freq: Once | ORAL | Status: AC
  Administered 2020-07-24: 10 mg via ORAL

## 2020-07-24 MED ORDER — ACETAMINOPHEN 325 MG PO TABS
ORAL_TABLET | ORAL | Status: AC
  Filled 2020-07-24: qty 2

## 2020-07-24 MED ORDER — FAMOTIDINE IN NACL 20-0.9 MG/50ML-% IV SOLN
INTRAVENOUS | Status: AC
  Filled 2020-07-24: qty 50

## 2020-07-24 MED ORDER — SODIUM CHLORIDE 0.9 % IV SOLN
Freq: Once | INTRAVENOUS | Status: AC
  Filled 2020-07-24: qty 250

## 2020-07-24 MED ORDER — PROCHLORPERAZINE MALEATE 10 MG PO TABS
ORAL_TABLET | ORAL | Status: AC
  Filled 2020-07-24: qty 1

## 2020-07-24 MED ORDER — DIPHENHYDRAMINE HCL 25 MG PO CAPS
ORAL_CAPSULE | ORAL | Status: AC
  Filled 2020-07-24: qty 2

## 2020-07-24 MED ORDER — HEPARIN SOD (PORK) LOCK FLUSH 100 UNIT/ML IV SOLN
500.0000 [IU] | Freq: Once | INTRAVENOUS | Status: AC | PRN
  Administered 2020-07-24: 500 [IU]
  Filled 2020-07-24: qty 5

## 2020-07-24 MED ORDER — ACETAMINOPHEN 325 MG PO TABS
650.0000 mg | ORAL_TABLET | Freq: Once | ORAL | Status: AC
  Administered 2020-07-24: 650 mg via ORAL

## 2020-07-24 MED ORDER — SODIUM CHLORIDE 0.9 % IV SOLN
10.1000 mg/kg | Freq: Once | INTRAVENOUS | Status: AC
  Administered 2020-07-24: 1100 mg via INTRAVENOUS
  Filled 2020-07-24: qty 32

## 2020-07-24 NOTE — Patient Instructions (Signed)
[facility] °Discharge Instructions for Patients Receiving Chemotherapy ° °Today you received the following chemotherapy agents: elotuzumab. ° °To help prevent nausea and vomiting after your treatment, we encourage you to take your nausea medication as directed. °  °If you develop nausea and vomiting that is not controlled by your nausea medication, call the clinic.  ° °BELOW ARE SYMPTOMS THAT SHOULD BE REPORTED IMMEDIATELY: °· *FEVER GREATER THAN 100.5 F °· *CHILLS WITH OR WITHOUT FEVER °· NAUSEA AND VOMITING THAT IS NOT CONTROLLED WITH YOUR NAUSEA MEDICATION °· *UNUSUAL SHORTNESS OF BREATH °· *UNUSUAL BRUISING OR BLEEDING °· TENDERNESS IN MOUTH AND THROAT WITH OR WITHOUT PRESENCE OF ULCERS °· *URINARY PROBLEMS °· *BOWEL PROBLEMS °· UNUSUAL RASH °Items with * indicate a potential emergency and should be followed up as soon as possible. ° °Feel free to call the clinic should you have any questions or concerns. The clinic phone number is [phone]. ° °Please show the CHEMO ALERT CARD at check-in to the Emergency Department and triage nurse. ° ° °

## 2020-07-24 NOTE — Progress Notes (Signed)
  Patient Name: Michael [name] MRN: [id number] DOB: 10-18-1950 Referring Physician: [name] (Profile Not Attached) Date of Service: 06/24/2020 [facility], [place]                                                        End Of Treatment Note  Diagnoses: C90.00-Multiple myeloma not having achieved remission  Intent: Palliative  Radiation Treatment Dates: 06/10/2020 through 06/24/2020 Site Technique Total Dose (Gy) Dose per Fx (Gy) Completed Fx Beam Energies  Ilium, Right: Pelvis_Rt_Ilium 3D 25/25 2.5 10/10 15X  Chest: Chest 3D 25/25 2.5 10/10 15X  Cranium: Brain_skull 3D 25/25 2.5 10/10 6X, 10X   Narrative: The patient tolerated radiation therapy relatively well.   Plan: The patient will follow-up with radiation oncology in 29mo . -----------------------------------  [name], MD

## 2020-07-24 NOTE — Progress Notes (Signed)
Radiation Oncology         [phone] ________________________________  Name: Michael [name] MRN: [id number]  Date: 07/24/2020  DOB: August 07, 1951  Follow-Up Visit Note  Outpatient  CC: [name], MD  [name], MD  Diagnosis and Prior Radiotherapy:    ICD-10-CM   1. Metastatic multiple myeloma to bone (HCC)  C90.00   2. Multiple myeloma not having achieved remission (HCC)  C90.00    Radiation treatment dates:   06/04/2018-06/15/2018 Site/dose:   Larynx, 2.5 Gy x 10 fractions for a total dose of 25 Gy   Radiation Treatment Dates: 06/10/2020 through 06/24/2020 Site Technique Total Dose (Gy) Dose per Fx (Gy) Completed Fx Beam Energies  Ilium, Right: Pelvis_Rt_Ilium 3D 25/25 2.5 10/10 15X  Chest: Chest 3D 25/25 2.5 10/10 15X  Cranium: Brain_skull 3D 25/25 2.5 10/10 6X, 10X   CHIEF COMPLAINT: Here for follow-up and surveillance of multiple myeloma  Narrative:  The patient returns today for routine follow-up.   Michael [name] returns today for follow-up of radiation to his scalp and posterior skull, upper and mid thoracic spine, and to the right iliac wing completed on 06/24/2020  Pain: Occasional right hip pain. Place behind right ear improved. Continued pain from axilla, across chest (along nipple line). Posterior skull area improved but reports more prominent indentation. Also reports a random pain/spasm in sternal area that radiates outwards (usually precipitated by movement). States it is very jarring (causes a "clenching" response), but it often resolves quickly Neuro: Denies any headaches, blurry vision, auditory changes, slurred speech, or memory issues. Appetite/Swallowing issues:  Wt Readings from Last 3 Encounters:  07/24/20 241 lb 4 oz (109.4 kg)  07/09/20 245 lb 8 oz (111.4 kg)  06/26/20 239 lb 12.8 oz (108.8 kg)   Mobility: Reports more difficulty getting into/out of the car. Able to ambulate without assistance, but does have a slow gait. Reports neuropathy in right hand  and bilateral feet.   Other notable issues:  Dr. Sullivan Lone  07/09/2020 PLAN:  -Discussed pt labwork today, 07/09/20; all values are WNL except for RBC at 3.50, Hgb at 11.5, HCT at 34.4, PLT at 113K, Lymphs Abs at 0.1K, Glucose at 206, Albumin at 3.2, AST at 11. -The pt has no prohibitive toxicities from continuing C1D8 Elotuzumab at this time.  -Continue Pomalidomide 3 weeks on/1 week off. Pt has no prohibitive toxicities at this time. -Advised pt that the side effects from radiation should continue to improve with time.  -Recommended that the pt continue to eat well, drink at least 48-64 oz of water each day, and walk 20-30 minutes each day.   -Continue twice weekly 50K UT Vitamin D -Continue 600 mg Gabapentin TID   -Continue Zometa q37month -Will see back in 2 weeks with labs  Vitals:   07/24/20 1445  BP: (!) 145/77  Pulse: 78  Resp: 20  Temp: 97.9 F (36.6 C)  SpO2: 98%                                ALLERGIES:  has No Known Allergies.  Meds: Current Outpatient Medications  Medication Sig Dispense Refill  . acyclovir (ZOVIRAX) 400 MG tablet Take 1 tablet by mouth twice daily 60 tablet 0  . aspirin 81 MG tablet Take 81 mg by mouth daily.    .[name] Kitchenatorvastatin (LIPITOR) 10 MG tablet Take 10 mg by mouth daily.    . Calcium Citrate-Vitamin D (CALCIUM CITRATE +  D3 MAXIMUM PO) Take 630 mg by mouth.     . dexamethasone (DECADRON) 4 MG tablet Take 7 tabs (61m) between 3 & 24 hours prior to chemo. 28 tablet 2  . ergocalciferol (VITAMIN D2) 1.25 MG (50000 UT) capsule Take 1 capsule (50,000 Units total) by mouth 2 (two) times a week. 24 capsule 3  . gabapentin (NEURONTIN) 300 MG capsule Take 2 capsules (600 mg total) by mouth 3 (three) times daily. 180 capsule 2  . glucose blood (FREESTYLE LITE) test strip     . lidocaine-prilocaine (EMLA) cream Apply topically to port area prior to port access 30 g 3  . LORazepam (ATIVAN) 0.5 MG tablet Take 1 tablet (0.5 mg total) by mouth every 6  (six) hours as needed (Nausea or vomiting). 30 tablet 0  . Multiple Vitamin (MULTIVITAMIN) tablet Take 1 tablet by mouth daily.    . ondansetron (ZOFRAN) 8 MG tablet Take 1 tablet (8 mg total) by mouth 2 (two) times daily as needed (Nausea or vomiting). 30 tablet 1  . oxyCODONE (OXY IR/ROXICODONE) 5 MG immediate release tablet Take 1-2 tablets (5-10 mg total) by mouth every 4 (four) hours as needed for severe pain or breakthrough pain. 60 tablet 0  . pantoprazole (PROTONIX) 40 MG tablet Take 40 mg by mouth daily.     .[name] KitchenPOMALYST 2 MG capsule TAKE 1 CAPSULE DAILY FOR 21 DAYS ON AND 7 DAYS OFF, REPEAT EVERY 28 DAYS. 21 capsule 1  . prochlorperazine (COMPAZINE) 10 MG tablet Take 1 tablet (10 mg total) by mouth every 6 (six) hours as needed (Nausea or vomiting). 30 tablet 1  . senna-docusate (SENOKOT-S) 8.6-50 MG tablet Take 2 tablets by mouth at bedtime. Takes 50 mg (Patient not taking: Reported on 12/17/2019) 60 tablet 3   No current facility-administered medications for this encounter.    Physical Findings: The patient is in no acute distress. Patient is alert and oriented.  weight is 241 lb 4 oz (109.4 kg). His oral temperature is 97.9 F (36.6 C). His blood pressure is 145/77 (abnormal) and his pulse is 78. His respiration is 20 and oxygen saturation is 98%. .    He has some dryness of his skin over his thoracic back at midline in the radiation field.  He has a subcutaneous cyst-like mass over the right mid back.  He is ambulatory.  He has a concavity of the soft tissue in his posterior scalp where the cranial lesion was treated.   Lab Findings: Lab Results  Component Value Date   WBC 3.9 (L) 07/24/2020   HGB 12.3 (L) 07/24/2020   HCT 37.1 (L) 07/24/2020   MCV 98.1 07/24/2020   PLT 206 07/24/2020    Radiographic Findings: No results found.  Impression/Plan: He is healing well from radiotherapy.  He has had an anatomic change in his scalp where it was previously convex and now is concave,  indicating a  response to radiation therapy.  He also reports improvement in his right hip pain.  He reports that the sensation that radiates from his thoracic spine through his under arms and across the front of his chest has not resolved.  This pain/neuropathy/discomfort is slightly better.  I explained to him that I am optimistic that the radiation was likely effective at causing the myeloma to regress in the sites where we targeted his disease, and it is possible that he may have some persistent symptoms from where the lymphoma affected his spine.  I anticipate he will probably  get a PET scan for restaging after he has pursued some more systemic therapy for a durable period of time.  This will give a better understanding of how his myeloma has responded to the treatment.  I will see him back on an as-needed basis.  He is pleased with this plan and will continue to follow closely with medical oncology.  On date of service, in total, I spent 30 minutes on this encounter. Patient was seen in person.  _____________________________________   [name], MD

## 2020-07-24 NOTE — Progress Notes (Signed)
The patient was seen in the infusion room today as he was receiving treatment.  He reports having episodic pain in his left chest.  It is always associated with activity and last momentarily.  He reports that it is a tight gripping pain that is almost like a cramp.  An EKG was completed today which showed normal sinus rhythm with an automated report saying and inferior infarct of undetermined age however there was no clear gross changes on the patient's EKG, when compared to prior studies.  A referral will be made to cardiology for consideration of a stress test.  A referral has been entered today.  Sandi Mealy, MHS, PA-C Physician Assistant

## 2020-07-24 NOTE — Patient Instructions (Signed)

## 2020-07-30 ENCOUNTER — Encounter: Payer: Self-pay | Admitting: Hematology

## 2020-07-30 ENCOUNTER — Other Ambulatory Visit: Payer: Self-pay | Admitting: *Deleted

## 2020-07-30 DIAGNOSIS — C9000 Multiple myeloma not having achieved remission: Secondary | ICD-10-CM

## 2020-07-30 DIAGNOSIS — Z7189 Other specified counseling: Secondary | ICD-10-CM

## 2020-07-30 MED ORDER — DEXAMETHASONE 4 MG PO TABS: ORAL_TABLET | ORAL | 2 refills | Status: AC

## 2020-07-30 NOTE — Telephone Encounter (Signed)
Patient requested refill of desamethasone

## 2020-07-31 ENCOUNTER — Inpatient Hospital Stay (HOSPITAL_BASED_OUTPATIENT_CLINIC_OR_DEPARTMENT_OTHER): Payer: Medicare Other | Admitting: Hematology

## 2020-07-31 ENCOUNTER — Inpatient Hospital Stay: Payer: Medicare Other

## 2020-07-31 ENCOUNTER — Encounter: Payer: Self-pay | Admitting: Hematology

## 2020-07-31 ENCOUNTER — Other Ambulatory Visit: Payer: Self-pay

## 2020-07-31 VITALS — BP 131/74 | HR 80 | Temp 97.6°F | Resp 18 | Ht 72.0 in | Wt 242.1 lb

## 2020-07-31 DIAGNOSIS — C9000 Multiple myeloma not having achieved remission: Secondary | ICD-10-CM

## 2020-07-31 DIAGNOSIS — Z5112 Encounter for antineoplastic immunotherapy: Secondary | ICD-10-CM

## 2020-07-31 DIAGNOSIS — Z79899 Other long term (current) drug therapy: Secondary | ICD-10-CM | POA: Diagnosis not present

## 2020-07-31 DIAGNOSIS — Z7189 Other specified counseling: Secondary | ICD-10-CM

## 2020-07-31 DIAGNOSIS — E559 Vitamin D deficiency, unspecified: Secondary | ICD-10-CM

## 2020-07-31 LAB — CBC WITH DIFFERENTIAL/PLATELET
Abs Immature Granulocytes: 0.08 10*3/uL — ABNORMAL HIGH (ref 0.00–0.07)
Basophils Absolute: 0.1 10*3/uL (ref 0.0–0.1)
Basophils Relative: 2 %
Eosinophils Absolute: 0.1 10*3/uL (ref 0.0–0.5)
Eosinophils Relative: 2 %
HCT: 36.2 % — ABNORMAL LOW (ref 39.0–52.0)
Hemoglobin: 12.1 g/dL — ABNORMAL LOW (ref 13.0–17.0)
Immature Granulocytes: 2 %
Lymphocytes Relative: 3 %
Lymphs Abs: 0.2 10*3/uL — ABNORMAL LOW (ref 0.7–4.0)
MCH: 33 pg (ref 26.0–34.0)
MCHC: 33.4 g/dL (ref 30.0–36.0)
MCV: 98.6 fL (ref 80.0–100.0)
Monocytes Absolute: 0.3 10*3/uL (ref 0.1–1.0)
Monocytes Relative: 6 %
Neutro Abs: 4.7 10*3/uL (ref 1.7–7.7)
Neutrophils Relative %: 85 %
Platelets: 164 10*3/uL (ref 150–400)
RBC: 3.67 MIL/uL — ABNORMAL LOW (ref 4.22–5.81)
RDW: 13.9 % (ref 11.5–15.5)
WBC: 5.5 10*3/uL (ref 4.0–10.5)
nRBC: 0 % (ref 0.0–0.2)

## 2020-07-31 LAB — CMP (CANCER CENTER ONLY)
ALT: 15 U/L (ref 0–44)
AST: 12 U/L — ABNORMAL LOW (ref 15–41)
Albumin: 3.6 g/dL (ref 3.5–5.0)
Alkaline Phosphatase: 81 U/L (ref 38–126)
Anion gap: 4 — ABNORMAL LOW (ref 5–15)
BUN: 13 mg/dL (ref 8–23)
CO2: 27 mmol/L (ref 22–32)
Calcium: 9.2 mg/dL (ref 8.9–10.3)
Chloride: 107 mmol/L (ref 98–111)
Creatinine: 0.8 mg/dL (ref 0.61–1.24)
GFR, Estimated: >60 mL/min (ref >60)
Glucose, Bld: 142 mg/dL — ABNORMAL HIGH (ref 70–99)
Potassium: 3.9 mmol/L (ref 3.5–5.1)
Sodium: 138 mmol/L (ref 135–145)
Total Bilirubin: 1.3 mg/dL — ABNORMAL HIGH (ref 0.3–1.2)
Total Protein: 6.5 g/dL (ref 6.5–8.1)

## 2020-07-31 LAB — VITAMIN D 25 HYDROXY (VIT D DEFICIENCY, FRACTURES): Vit D, 25-Hydroxy: 65.49 ng/mL (ref 30–100)

## 2020-07-31 MED ORDER — DIPHENHYDRAMINE HCL 25 MG PO CAPS
ORAL_CAPSULE | ORAL | Status: AC
  Filled 2020-07-31: qty 2

## 2020-07-31 MED ORDER — FAMOTIDINE IN NACL 20-0.9 MG/50ML-% IV SOLN
INTRAVENOUS | Status: AC
  Filled 2020-07-31: qty 50

## 2020-07-31 MED ORDER — SODIUM CHLORIDE 0.9% FLUSH
10.0000 mL | INTRAVENOUS | Status: DC | PRN
  Administered 2020-07-31: 10 mL
  Filled 2020-07-31: qty 10

## 2020-07-31 MED ORDER — DEXAMETHASONE SODIUM PHOSPHATE 10 MG/ML IJ SOLN
INTRAMUSCULAR | Status: AC
  Filled 2020-07-31: qty 1

## 2020-07-31 MED ORDER — SODIUM CHLORIDE 0.9 % IV SOLN
1100.0000 mg | Freq: Once | INTRAVENOUS | Status: AC
  Administered 2020-07-31: 1100 mg via INTRAVENOUS
  Filled 2020-07-31: qty 32

## 2020-07-31 MED ORDER — ACETAMINOPHEN 325 MG PO TABS
650.0000 mg | ORAL_TABLET | Freq: Once | ORAL | Status: AC
  Administered 2020-07-31: 650 mg via ORAL

## 2020-07-31 MED ORDER — DIPHENHYDRAMINE HCL 25 MG PO CAPS
50.0000 mg | ORAL_CAPSULE | Freq: Once | ORAL | Status: AC
  Administered 2020-07-31: 50 mg via ORAL

## 2020-07-31 MED ORDER — ACETAMINOPHEN 325 MG PO TABS
ORAL_TABLET | ORAL | Status: AC
  Filled 2020-07-31: qty 2

## 2020-07-31 MED ORDER — PROCHLORPERAZINE MALEATE 10 MG PO TABS
10.0000 mg | ORAL_TABLET | Freq: Once | ORAL | Status: AC
  Administered 2020-07-31: 10 mg via ORAL

## 2020-07-31 MED ORDER — HEPARIN SOD (PORK) LOCK FLUSH 100 UNIT/ML IV SOLN
500.0000 [IU] | Freq: Once | INTRAVENOUS | Status: AC | PRN
  Administered 2020-07-31: 500 [IU]
  Filled 2020-07-31: qty 5

## 2020-07-31 MED ORDER — SODIUM CHLORIDE 0.9 % IV SOLN
Freq: Once | INTRAVENOUS | Status: AC
  Filled 2020-07-31: qty 250

## 2020-07-31 MED ORDER — SODIUM CHLORIDE 0.9 % IV SOLN
1100.0000 mg | Freq: Once | INTRAVENOUS | Status: DC
  Filled 2020-07-31: qty 44

## 2020-07-31 MED ORDER — PROCHLORPERAZINE MALEATE 10 MG PO TABS
ORAL_TABLET | ORAL | Status: AC
  Filled 2020-07-31: qty 1

## 2020-07-31 MED ORDER — FAMOTIDINE IN NACL 20-0.9 MG/50ML-% IV SOLN
20.0000 mg | Freq: Once | INTRAVENOUS | Status: AC
  Administered 2020-07-31: 20 mg via INTRAVENOUS

## 2020-07-31 MED ORDER — DEXAMETHASONE SODIUM PHOSPHATE 10 MG/ML IJ SOLN
8.0000 mg | Freq: Once | INTRAMUSCULAR | Status: AC
  Administered 2020-07-31: 8 mg via INTRAVENOUS

## 2020-07-31 NOTE — Progress Notes (Signed)
HEMATOLOGY/ONCOLOGY CLINIC NOTE  Date of Service: 07/31/20     Patient Care Team: [name], MD as PCP - General (Family Medicine) [name], MD as Consulting Physician (Hematology) [name], MD as Attending Physician (Radiation Oncology) [name], RN (Inactive) as Oncology Nurse Navigator (Oncology) [name] MD ([facility] _ primary oncology) phone number [phone] Email- [email or web address]  CHIEF COMPLAINTS  followup of multiple myeloma.  HISTORY OF PRESENTING ILLNESS:   Michael [name] is a wonderful 69 y.o. 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 Patient is currently on a study protocol and is being monitored for myeloma recurrence.  Based on available records being put together  -Patient was diagnosed with IgG kappa ISS stage I multiple myeloma with hyperdiploid complex cytogenetics in 2012. Bone marrow biopsy apparently showed 50% kappa restricted plasma cells with an initial M spike of 3.4 g/dL with evidence of lytic lesions on his PET/CT scan including right rib fracture and lesions of the lumbar and cervical spine. -Patient was treated under protocol 11-C-0221 with from 02/21/2013 with  Carfilzomib/Revlimid/Dexamethasone for a total of 8 cycles. He reports that his stem cells were collected and stored after 4 cycles -Posttreatment bone marrow biopsy showed less than 5% plasma cells with a negative flow cytometry and improvement in his previously PET avid lesions. Some concern for new focus of activity at C2 lytic lesion at L4 and several small lesions throughout the vertebrae. -Patient was on maintenance lenalidomide 10  mg by mouth daily for 2 years or more until end of 2014. 08/19/2013 - bone marrow biopsy showed normocellular marrow with less than 5% plasma cells. PET CT scan showed decreased focal metabolic ligament prior rib lesions and no new lesions. Flow cytometry was negative for residual disease. Serum and urine electrophoresis and IFE showed no evidence of monoclonal protein. -Patient notes that he was off protocol for a few years due to lack of clinical trial research funding. -Patient notes that he is back on a follow-up protocol and continues to follow and receive his primary treatment at [facility] at this time. -Patient reports he had his last PET CT scan blood and urine tests and bone marrow examination in February 2018. The results of which are not available to [place] currently.  He notes that there was concern for a very slowly growing sternal FDG avid lesion that has been present for years. He has a follow-up at [facility] next month to determine the management of this lesion. He reports that was some consideration of considering radiation. he notes that this lesion has not been biopsied. No other focal bone pains at this time.  We discussed in detail about what our role will be and noted that we shall be available for any acute issues that need to be taken care of locally but that at this time the primary treatment and evaluation will be driven by his team at [facility] as per their research protocols and input. The patient and his wife are clearly aware of this and are in agreement with this plan.  INTERVAL HISTORY Michael [name] is here for continued management of his Multiple Myeloma. He is here for C2D1 Elotuzumab. The patient's last visit with [place]  was on 07/09/2020. The pt reports that he is doing well overall.  The pt reports that he continues having chest wall and back pain.This discomfort lasts for a short time and is positional. He denies chest pressure or a squeezing sensation in his chest. He was  referred to a Cardiologist by Sandi Mealy, PA-C, but pt is unsure if the visit is necessary. Pt has an appointment with Dr. Aris Lot at [facility] in November.  Lab results today (07/31/20) of CBC w/diff and CMP is as follows: all values are WNL except for RBC at 3.67, Hgb at 12.1, HCT at 36.2, Lymphs Abs at 0,2K, Abs Immature Granulocytes at 0.08K, Glucose at 142, AST at 12, Total Bilirubin at 1.3, Anion gap at 4. 07/31/2020 Vitamin D 25 (OH) at 65.49  On review of systems, pt reports back pain, chest wall pain and denies chest pain and any other symptoms.   MEDICAL HISTORY:  Past Medical History:  Diagnosis Date  . Angioedema   . CPAP (continuous positive airway pressure) dependence    uses at night  . Gastroesophageal reflux disease without esophagitis   . History of radiation therapy 06/04/18- 06/15/18   25 Gy in 10 fractions to the laryngeal/ cartilage lesion  . Hyperlipidemia   . Multiple myeloma ([place]) 2012   Treated at [facility]  . Non morbid obesity due to excess calories   . Prediabetes   . Prediabetes   . Unspecified glaucoma(365.9)   . Urticaria, unspecified   Diabetes mellitus Glaucoma Left toes neuropathy  GERD  SURGICAL HISTORY: Past Surgical History:  Procedure Laterality Date  . IR IMAGING GUIDED PORT INSERTION  06/05/2020  . IR RADIOLOGIST EVAL & MGMT  11/13/2018  . PORTACATH PLACEMENT    . PORTACATH PLACEMENT     removed 02/2016    SOCIAL HISTORY: Social History   Socioeconomic History  . Marital status: Married    Spouse name: Not on file  . Number of children: 2  . Years of education: Not on file  . Highest education level: Not on file  Occupational History  . Occupation: Retired Chief Strategy Officer for [employer]  . Smoking status: Never Smoker  . Smokeless tobacco: Never Used  Vaping Use  . Vaping Use: Never used  Substance and Sexual Activity  . Alcohol use: Yes    Alcohol/week: 0.0 standard drinks    Comment: Occasional beer  . Drug use: No    . Sexual activity: Not Currently  Other Topics Concern  . Not on file  Social History Narrative   Unable to [employer] partner violence- partner in room    Social Determinants of Health   Financial Resource Strain:   . Difficulty of Paying Living Expenses: Not on file  Food Insecurity:   . Worried About Charity fundraiser in the Last Year: Not on file  . Ran Out of Food in the Last Year: Not on file  Transportation Needs:   . Lack of Transportation (Medical): Not on file  . Lack of Transportation (Non-Medical): Not on file  Physical Activity:   . Days of Exercise per Week: Not on file  . Minutes of Exercise per Session: Not on file  Stress:   . Feeling of Stress : Not on file  Social Connections:   . Frequency of Communication with Friends and Family: Not on file  . Frequency of Social Gatherings with Friends and Family: Not on file  . Attends Religious Services: Not on file  .  Active Member of Clubs or Organizations: Not on file  . Attends Archivist Meetings: Not on file  . Marital Status: Not on file  Intimate Partner Violence:   . Fear of Current or Ex-Partner: Not on file  . Emotionally Abused: Not on file  . Physically Abused: Not on file  . Sexually Abused: Not on file  Never smoker Married Retired from the [employer] in June 2017 and moved to [facility].  FAMILY HISTORY: Family History  Problem Relation Age of Onset  . Cancer Father     ALLERGIES:  has No Known Allergies.  MEDICATIONS:  Current Outpatient Medications  Medication Sig Dispense Refill  . acyclovir (ZOVIRAX) 400 MG tablet Take 1 tablet by mouth twice daily 60 tablet 0  . aspirin 81 MG tablet Take 81 mg by mouth daily.    [name] atorvastatin (LIPITOR) 10 MG tablet Take 10 mg by mouth daily.    . Calcium Citrate-Vitamin D (CALCIUM CITRATE + D3 MAXIMUM PO) Take 630 mg by mouth.     . dexamethasone (DECADRON) 4 MG tablet Take 7 tabs (70m) between 3 & 24 hours prior to  chemo. 28 tablet 2  . ergocalciferol (VITAMIN D2) 1.25 MG (50000 UT) capsule Take 1 capsule (50,000 Units total) by mouth 2 (two) times a week. 24 capsule 3  . gabapentin (NEURONTIN) 300 MG capsule Take 2 capsules (600 mg total) by mouth 3 (three) times daily. 180 capsule 2  . glucose blood (FREESTYLE LITE) test strip     . lidocaine-prilocaine (EMLA) cream Apply topically to port area prior to port access 30 g 3  . LORazepam (ATIVAN) 0.5 MG tablet Take 1 tablet (0.5 mg total) by mouth every 6 (six) hours as needed (Nausea or vomiting). 30 tablet 0  . Multiple Vitamin (MULTIVITAMIN) tablet Take 1 tablet by mouth daily.    . ondansetron (ZOFRAN) 8 MG tablet Take 1 tablet (8 mg total) by mouth 2 (two) times daily as needed (Nausea or vomiting). 30 tablet 1  . oxyCODONE (OXY IR/ROXICODONE) 5 MG immediate release tablet Take 1-2 tablets (5-10 mg total) by mouth every 4 (four) hours as needed for severe pain or breakthrough pain. 60 tablet 0  . pantoprazole (PROTONIX) 40 MG tablet Take 40 mg by mouth daily.     .[name] KitchenPOMALYST 2 MG capsule TAKE 1 CAPSULE DAILY FOR 21 DAYS ON AND 7 DAYS OFF, REPEAT EVERY 28 DAYS. 21 capsule 1  . prochlorperazine (COMPAZINE) 10 MG tablet Take 1 tablet (10 mg total) by mouth every 6 (six) hours as needed (Nausea or vomiting). 30 tablet 1  . senna-docusate (SENOKOT-S) 8.6-50 MG tablet Take 2 tablets by mouth at bedtime. Takes 50 mg (Patient not taking: Reported on 12/17/2019) 60 tablet 3   No current facility-administered medications for this visit.    REVIEW OF SYSTEMS:   A 10+ POINT REVIEW OF SYSTEMS WAS OBTAINED including neurology, dermatology, psychiatry, cardiac, respiratory, lymph, extremities, GI, GU, Musculoskeletal, constitutional, breasts, reproductive, HEENT.  All pertinent positives are noted in the HPI.  All others are negative.   PHYSICAL EXAMINATION: ECOG PERFORMANCE STATUS: 1 - Symptomatic but completely ambulatory  Vitals:   07/31/20 1227  BP: 131/74    Pulse: 80  Resp: 18  Temp: 97.6 F (36.4 C)  TempSrc: Tympanic  SpO2: 100%  Weight: 242 lb 1.6 oz (109.8 kg)  Height: 6' (1.829 m)   Body mass index is 32.83 kg/m.   GENERAL:alert, in no acute distress and  comfortable SKIN: no acute rashes, no significant lesions EYES: conjunctiva are pink and non-injected, sclera anicteric OROPHARYNX: MMM, no exudates, no oropharyngeal erythema or ulceration NECK: supple, no JVD LYMPH:  no palpable lymphadenopathy in the cervical, axillary or inguinal regions LUNGS: clear to auscultation b/l with normal respiratory effort HEART: regular rate & rhythm ABDOMEN:  normoactive bowel sounds , non tender, not distended. No palpable hepatosplenomegaly.  Extremity: no pedal edema PSYCH: alert & oriented x 3 with fluent speech NEURO: no focal motor/sensory deficits  LABORATORY DATA:  I have reviewed the data as listed . CBC Latest Ref Rng & Units 07/24/2020 07/17/2020 07/09/2020  WBC 4.0 - 10.5 K/uL 3.9(L) 3.5(L) 5.6  Hemoglobin 13.0 - 17.0 g/dL 12.3(L) 12.6(L) 11.5(L)  Hematocrit 39 - 52 % 37.1(L) 37.3(L) 34.4(L)  Platelets 150 - 400 K/uL 206 203 113(L)    . CMP Latest Ref Rng & Units 07/24/2020 07/17/2020 07/09/2020  Glucose 70 - 99 mg/dL 292(H) 181(H) 206(H)  BUN 8 - 23 mg/dL 13 14 8   Creatinine 0.61 - 1.24 mg/dL 0.86 0.94 0.79  Sodium 135 - 145 mmol/L 139 136 136  Potassium 3.5 - 5.1 mmol/L 3.8 4.5 4.0  Chloride 98 - 111 mmol/L 104 103 104  CO2 22 - 32 mmol/L 27 26 27   Calcium 8.9 - 10.3 mg/dL 9.4 9.6 9.4  Total Protein 6.5 - 8.1 g/dL 6.6 7.4 6.6  Total Bilirubin 0.3 - 1.2 mg/dL 1.0 1.3(H) 0.9  Alkaline Phos 38 - 126 U/L 79 92 83  AST 15 - 41 U/L 9(L) 14(L) 11(L)  ALT 0 - 44 U/L 15 18 15    06/04/2020 [facility] Bone Marrow Report ([id number])::   06/04/2020 [facility] Flow Cytometry [phone]):   08/16/2019 BM Bx Report ([id number]):   08/16/2019 FISH Panel:      05/08/18 Left Neck Thyroid Cartilage Biopsy:        RADIOGRAPHIC  STUDIES: I have personally reviewed the radiological images as listed and agreed with the findings in the report. No results found.  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 Bone marrow biopsy apparently showed 50% kappa restricted plasma cells with an initial M spike of 3.4 g/dL with evidence of lytic lesions on his PET/CT scan including right rib fracture and lesions of the lumbar and cervical spine. -Patient was treated under protocol 11-C-0221 with from 02/21/2013 with  Carfilzomib/Revlimid/Dexamethasone for a total of 8 cycles. He reports that his stem cells were collected and stored after 4 cycles -Posttreatment bone marrow biopsy showed less than 5% plasma cells with a negative flow cytometry and improvement in his previously PET avid lesions. Some concern for new focus of activity at C2 lytic lesion at L4 and several small lesions throughout the vertebrae. -Patient was on maintenance lenalidomide 10 mg by mouth daily for 2 years or more until end of 2014. 08/19/2013 - bone marrow biopsy showed normocellular marrow with less than 5% plasma cells. PET CT scan showed decreased focal metabolic ligament prior rib lesions and no new lesions. Flow cytometry was negative for residual disease. Serum and urine electrophoresis and IFE showed no evidence of monoclonal protein. -Patient notes that he was off protocol for a few years due to lack of clinical trial research funding. -Patient returned to follow-up protocol and began to follow and receive his primary treatment at [facility] labs show  minimal IgG kappa protein on IFE and a CT chest in May 2018 that did not show any overt signs of myeloma progression in the bones.  Rpt Myeloma labs 03/2017 - M spike of 0.3g/dl with a repeat M spike stable at 0.3 g/dL on 05/23/2017 which  remains stable on labs today 07/25/17 Serum kappa lambda free light chain ratio not significantly changed. PET/CT scan showed a single metabolically active sternal lesion without any overt bony destruction. Bone marrow biopsy done on 06/06/2017 - shows no overt involvement with multiple myeloma.  04/24/18 CT Soft Tissue Neck revealed Left laryngeal 3 cm soft tissue mass appears to have originated within and is expanding the left thyroid cartilage. This is new since the August 2018 PET-CT, and there is absent lymphadenopathy. 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 Primary cartilaginous neoplasm, squamous cell carcinoma, and other differential considerations are less likely. Superimposed widespread multiple myeloma lesions in the visible skeleton.  05/08/18 Left neck thyroid cartilage biopsy revealed Plasma cell neoplasm.  05/22/18 PET/CT revealed Soft tissue mass centered around the destroyed left thyroid cartilage is hypermetabolic and consistent with known plasmacytoma. No adenopathy in the neck. 2. Hypermetabolic 3 cm cutaneous and subcutaneous soft tissue mass involving the posterior upper thorax suspicious for cutaneous manifestation of myeloma (plasmacytoma). 3. Persistent and slightly progressive hypermetabolic sternal lesion. 4. Diffuse lytic myelomatous lesions throughout the bony structures but no other hypermetabolic foci. 5. Focus of hypermetabolism in the left aspect of the lower prostate gland, not present on the prior study. This could be an area of infection/inflammation or potential prostate cancer. Recommend correlation with physical examination and PSA level.    Completed RT between 06/04/18 and 06/15/18 of 25 Gy by 10 fractions  The pt pursued a clinical trial at the [facility] with RT and immunotherapy in October 2019 through October 18, 2018 with further information available at [email or web address]  10/18/18 M Protein increased to  3.5g, from 0.7g at our last visit on 07/10/18. 10/02/18 PET/CT from [facility] indicated new hypermetabolism in left clavicle, left scapula, bilateral ribs, spine, and left proximal femur 10/18/18 MRI from [facility] ndicated a pathologic fracture at T12  10/2018 M Protein at 4.1g  11/23/18 NM Bone revealed Patchy uptake throughout the ribs and spine with areas of focally increased activity in both T12 pedicles and the right L1 pedicle. This focal activity could be stress related without clear corresponding fracture on available prior studies.  08/16/2019 FISH Panel which revealed "No mutations detected."  08/16/2019 [facility] Surgical Pathology [phone]) which revealed  "DIAGNOSIS: BONE MARROW, ASPIRATE, CLOT, CORE: -Hypercellular bone marrow for age with trilineage hematopoiesis and less than 1% plasma cells PERIPHERAL BLOOD: -Macrocytic anemia -Leukocytosis ".  09/03/2019 PET/CT scan ([id number]) revealed  "1. No evidence of active multiple myeloma on whole-body FDG PET scan. 2. Stable lytic lesions within the spine and sternum consistent treated metastasis. 3. No soft tissue plasmacytoma. 4. New compression fracture at T12 compared to PET-CT scan 10/02/2018."  06/24/2020 MRI Brain ([id number]) revealed "Myelomatous lesion spanning the midline occipital calvarium with bony destruction. There is mild epidural extension abutting the superior sagittal sinus without invasion or significant compression. Suspected additional myelomatous involvement of the clivus adjacent to the posterior floor of the left sphenoid sinus. Contiguous enhancing tissue within the sinus could reflect extraosseous extension of disease or unrelated sinus inflammatory disease."  2) h/o Elevated bilirubin level . ? [place]  3) PET/CT abnormality in the Prostate. PSA levels WNL -Primary care physician to consider  urology referral  4) Abnormal focus of FDG avid at a spot in the liver - will need to be monitor on f/u scans. No pain or other  symptoms at this time.  PLAN:  -Discussed pt labwork today, 07/31/20; all values are WNL except for RBC at 3.67, Hgb at 12.1, HCT at 36.2, Lymphs Abs at 0,2K, Abs Immature Granulocytes at 0.08K, Glucose at 142, AST at 12, Total Bilirubin at 1.3, Anion gap at 4,  -Discussed 07/31/2020 Vitamin D 25 (OH) >60  -Discussed 07/17/2020 MMP shows M Protein at 0.6 g/dL - already seeing a significant response. -The pt has no prohibitive toxicities from continuing C2D1 Elotuzumab at this time. -Continue Pomalidomide 3 weeks on/1 week off. Pt has no prohibitive toxicities.   -Recommend pt f/u with Dr. Aris Lot as scheduled. -Continue twice weekly 50K UT Vitamin D -Continue 600 mg Gabapentin TID   -Continue Zometa q66month -Will see back with C3   FOLLOW UP: Plz schedule C2 weekly ELotuzumab with portflush and labs with each treatment Plz schedule C3D1 of Elotuzumab with portflush, labs and MD visit   The total time spent in the appt was 30 minutes and more than 50% was on counseling and direct patient cares.  All of the patient's questions were answered with apparent satisfaction. The patient knows to call the clinic with any problems, questions or concerns.    GSullivan LoneMD MPeaceful ValleyAAHIVMS SHickory Ridge Surgery CtrCCharleston Ent Associates LLC Dba Surgery Center Of CharlestonHematology/Oncology Physician CLifecare Hospitals Of Wisconsin (Office):       [phone](Work cell):  [phone](Fax):           [phone] I, JYevette Edwards am acting as a scribe for Dr. GSullivan Lone   .I have reviewed the above documentation for accuracy and completeness, and I agree with the above. .Brunetta GeneraMD

## 2020-07-31 NOTE — Patient Instructions (Signed)
[facility] °Discharge Instructions for Patients Receiving Chemotherapy ° °Today you received the following chemotherapy agents: elotuzumab. ° °To help prevent nausea and vomiting after your treatment, we encourage you to take your nausea medication as directed. °  °If you develop nausea and vomiting that is not controlled by your nausea medication, call the clinic.  ° °BELOW ARE SYMPTOMS THAT SHOULD BE REPORTED IMMEDIATELY: °· *FEVER GREATER THAN 100.5 F °· *CHILLS WITH OR WITHOUT FEVER °· NAUSEA AND VOMITING THAT IS NOT CONTROLLED WITH YOUR NAUSEA MEDICATION °· *UNUSUAL SHORTNESS OF BREATH °· *UNUSUAL BRUISING OR BLEEDING °· TENDERNESS IN MOUTH AND THROAT WITH OR WITHOUT PRESENCE OF ULCERS °· *URINARY PROBLEMS °· *BOWEL PROBLEMS °· UNUSUAL RASH °Items with * indicate a potential emergency and should be followed up as soon as possible. ° °Feel free to call the clinic should you have any questions or concerns. The clinic phone number is [phone]. ° °Please show the CHEMO ALERT CARD at check-in to the Emergency Department and triage nurse. ° ° °

## 2020-08-03 ENCOUNTER — Encounter: Payer: Self-pay | Admitting: Hematology

## 2020-08-05 ENCOUNTER — Encounter: Payer: Self-pay | Admitting: General Practice

## 2020-08-07 ENCOUNTER — Inpatient Hospital Stay: Payer: Medicare Other

## 2020-08-07 ENCOUNTER — Other Ambulatory Visit: Payer: Self-pay

## 2020-08-07 VITALS — BP 139/74 | HR 81 | Temp 98.5°F | Resp 18 | Wt 246.2 lb

## 2020-08-07 DIAGNOSIS — C9000 Multiple myeloma not having achieved remission: Secondary | ICD-10-CM | POA: Diagnosis not present

## 2020-08-07 DIAGNOSIS — Z5112 Encounter for antineoplastic immunotherapy: Secondary | ICD-10-CM

## 2020-08-07 DIAGNOSIS — C9002 Multiple myeloma in relapse: Secondary | ICD-10-CM

## 2020-08-07 DIAGNOSIS — Z79899 Other long term (current) drug therapy: Secondary | ICD-10-CM | POA: Diagnosis not present

## 2020-08-07 DIAGNOSIS — Z7189 Other specified counseling: Secondary | ICD-10-CM

## 2020-08-07 LAB — CBC WITH DIFFERENTIAL/PLATELET
Abs Immature Granulocytes: 0.21 10*3/uL — ABNORMAL HIGH (ref 0.00–0.07)
Basophils Absolute: 0.1 10*3/uL (ref 0.0–0.1)
Basophils Relative: 1 %
Eosinophils Absolute: 0.1 10*3/uL (ref 0.0–0.5)
Eosinophils Relative: 1 %
HCT: 34.9 % — ABNORMAL LOW (ref 39.0–52.0)
Hemoglobin: 11.7 g/dL — ABNORMAL LOW (ref 13.0–17.0)
Immature Granulocytes: 4 %
Lymphocytes Relative: 3 %
Lymphs Abs: 0.2 10*3/uL — ABNORMAL LOW (ref 0.7–4.0)
MCH: 33.4 pg (ref 26.0–34.0)
MCHC: 33.5 g/dL (ref 30.0–36.0)
MCV: 99.7 fL (ref 80.0–100.0)
Monocytes Absolute: 0.1 10*3/uL (ref 0.1–1.0)
Monocytes Relative: 2 %
Neutro Abs: 5.4 10*3/uL (ref 1.7–7.7)
Neutrophils Relative %: 89 %
Platelets: 192 10*3/uL (ref 150–400)
RBC: 3.5 MIL/uL — ABNORMAL LOW (ref 4.22–5.81)
RDW: 14.1 % (ref 11.5–15.5)
WBC: 5.9 10*3/uL (ref 4.0–10.5)
nRBC: 0 % (ref 0.0–0.2)

## 2020-08-07 LAB — CMP (CANCER CENTER ONLY)
ALT: 13 U/L (ref 0–44)
AST: 13 U/L — ABNORMAL LOW (ref 15–41)
Albumin: 3.6 g/dL (ref 3.5–5.0)
Alkaline Phosphatase: 74 U/L (ref 38–126)
Anion gap: 7 (ref 5–15)
BUN: 13 mg/dL (ref 8–23)
CO2: 25 mmol/L (ref 22–32)
Calcium: 9.2 mg/dL (ref 8.9–10.3)
Chloride: 105 mmol/L (ref 98–111)
Creatinine: 0.92 mg/dL (ref 0.61–1.24)
GFR, Estimated: >60 mL/min (ref >60)
Glucose, Bld: 231 mg/dL — ABNORMAL HIGH (ref 70–99)
Potassium: 4.2 mmol/L (ref 3.5–5.1)
Sodium: 137 mmol/L (ref 135–145)
Total Bilirubin: 1 mg/dL (ref 0.3–1.2)
Total Protein: 6.2 g/dL — ABNORMAL LOW (ref 6.5–8.1)

## 2020-08-07 MED ORDER — SODIUM CHLORIDE 0.9% FLUSH
10.0000 mL | INTRAVENOUS | Status: DC | PRN
  Administered 2020-08-07: 10 mL
  Filled 2020-08-07: qty 10

## 2020-08-07 MED ORDER — HEPARIN SOD (PORK) LOCK FLUSH 100 UNIT/ML IV SOLN
500.0000 [IU] | Freq: Once | INTRAVENOUS | Status: AC | PRN
  Administered 2020-08-07: 500 [IU]
  Filled 2020-08-07: qty 5

## 2020-08-07 MED ORDER — DEXAMETHASONE SODIUM PHOSPHATE 10 MG/ML IJ SOLN
INTRAMUSCULAR | Status: AC
  Filled 2020-08-07: qty 1

## 2020-08-07 MED ORDER — PROCHLORPERAZINE MALEATE 10 MG PO TABS
ORAL_TABLET | ORAL | Status: AC
  Filled 2020-08-07: qty 1

## 2020-08-07 MED ORDER — PROCHLORPERAZINE MALEATE 10 MG PO TABS
10.0000 mg | ORAL_TABLET | Freq: Once | ORAL | Status: AC
  Administered 2020-08-07: 10 mg via ORAL

## 2020-08-07 MED ORDER — SODIUM CHLORIDE 0.9 % IV SOLN
Freq: Once | INTRAVENOUS | Status: AC
  Filled 2020-08-07: qty 250

## 2020-08-07 MED ORDER — SODIUM CHLORIDE 0.9 % IV SOLN
1100.0000 mg | Freq: Once | INTRAVENOUS | Status: AC
  Administered 2020-08-07: 1100 mg via INTRAVENOUS
  Filled 2020-08-07: qty 12

## 2020-08-07 MED ORDER — FAMOTIDINE IN NACL 20-0.9 MG/50ML-% IV SOLN
INTRAVENOUS | Status: AC
  Filled 2020-08-07: qty 50

## 2020-08-07 MED ORDER — ACETAMINOPHEN 325 MG PO TABS
ORAL_TABLET | ORAL | Status: AC
  Filled 2020-08-07: qty 2

## 2020-08-07 MED ORDER — DIPHENHYDRAMINE HCL 25 MG PO CAPS
ORAL_CAPSULE | ORAL | Status: AC
  Filled 2020-08-07: qty 2

## 2020-08-07 MED ORDER — DIPHENHYDRAMINE HCL 25 MG PO CAPS
50.0000 mg | ORAL_CAPSULE | Freq: Once | ORAL | Status: AC
  Administered 2020-08-07: 50 mg via ORAL

## 2020-08-07 MED ORDER — ACETAMINOPHEN 325 MG PO TABS
650.0000 mg | ORAL_TABLET | Freq: Once | ORAL | Status: AC
  Administered 2020-08-07: 650 mg via ORAL

## 2020-08-07 MED ORDER — DEXAMETHASONE SODIUM PHOSPHATE 10 MG/ML IJ SOLN
8.0000 mg | Freq: Once | INTRAMUSCULAR | Status: AC
  Administered 2020-08-07: 8 mg via INTRAVENOUS

## 2020-08-07 MED ORDER — FAMOTIDINE IN NACL 20-0.9 MG/50ML-% IV SOLN
20.0000 mg | Freq: Once | INTRAVENOUS | Status: AC
  Administered 2020-08-07: 20 mg via INTRAVENOUS

## 2020-08-07 NOTE — Patient Instructions (Signed)
[facility] Discharge Instructions for Patients Receiving Chemotherapy  Today you received the following chemotherapy agents: elotuzumab.  To help prevent nausea and vomiting after your treatment, we encourage you to take your nausea medication as directed.   If you develop nausea and vomiting that is not controlled by your nausea medication, call the clinic.   BELOW ARE SYMPTOMS THAT SHOULD BE REPORTED IMMEDIATELY:  *FEVER GREATER THAN 100.5 F  *CHILLS WITH OR WITHOUT FEVER  NAUSEA AND VOMITING THAT IS NOT CONTROLLED WITH YOUR NAUSEA MEDICATION  *UNUSUAL SHORTNESS OF BREATH  *UNUSUAL BRUISING OR BLEEDING  TENDERNESS IN MOUTH AND THROAT WITH OR WITHOUT PRESENCE OF ULCERS  *URINARY PROBLEMS  *BOWEL PROBLEMS  UNUSUAL RASH Items with * indicate a potential emergency and should be followed up as soon as possible.  Feel free to call the clinic should you have any questions or concerns. The clinic phone number is [phone].  Please show the [facility] at check-in to the Emergency Department and triage nurse.

## 2020-08-07 NOTE — Patient Instructions (Signed)

## 2020-08-13 DIAGNOSIS — H401131 Primary open-angle glaucoma, bilateral, mild stage: Secondary | ICD-10-CM | POA: Diagnosis not present

## 2020-08-14 ENCOUNTER — Other Ambulatory Visit: Payer: Self-pay

## 2020-08-14 ENCOUNTER — Inpatient Hospital Stay: Payer: Medicare Other

## 2020-08-14 VITALS — BP 132/65 | HR 70 | Temp 98.9°F | Resp 18

## 2020-08-14 DIAGNOSIS — Z7189 Other specified counseling: Secondary | ICD-10-CM

## 2020-08-14 DIAGNOSIS — E559 Vitamin D deficiency, unspecified: Secondary | ICD-10-CM

## 2020-08-14 DIAGNOSIS — C9002 Multiple myeloma in relapse: Secondary | ICD-10-CM

## 2020-08-14 DIAGNOSIS — Z5112 Encounter for antineoplastic immunotherapy: Secondary | ICD-10-CM | POA: Diagnosis not present

## 2020-08-14 DIAGNOSIS — C9000 Multiple myeloma not having achieved remission: Secondary | ICD-10-CM

## 2020-08-14 DIAGNOSIS — Z79899 Other long term (current) drug therapy: Secondary | ICD-10-CM | POA: Diagnosis not present

## 2020-08-14 LAB — CMP (CANCER CENTER ONLY)
ALT: 19 U/L (ref 0–44)
AST: 12 U/L — ABNORMAL LOW (ref 15–41)
Albumin: 3.3 g/dL — ABNORMAL LOW (ref 3.5–5.0)
Alkaline Phosphatase: 65 U/L (ref 38–126)
Anion gap: 6 (ref 5–15)
BUN: 13 mg/dL (ref 8–23)
CO2: 26 mmol/L (ref 22–32)
Calcium: 9 mg/dL (ref 8.9–10.3)
Chloride: 107 mmol/L (ref 98–111)
Creatinine: 0.76 mg/dL (ref 0.61–1.24)
GFR, Estimated: >60 mL/min (ref >60)
Glucose, Bld: 150 mg/dL — ABNORMAL HIGH (ref 70–99)
Potassium: 4 mmol/L (ref 3.5–5.1)
Sodium: 139 mmol/L (ref 135–145)
Total Bilirubin: 0.8 mg/dL (ref 0.3–1.2)
Total Protein: 5.9 g/dL — ABNORMAL LOW (ref 6.5–8.1)

## 2020-08-14 LAB — CBC WITH DIFFERENTIAL/PLATELET
Abs Immature Granulocytes: 0.11 10*3/uL — ABNORMAL HIGH (ref 0.00–0.07)
Basophils Absolute: 0.1 10*3/uL (ref 0.0–0.1)
Basophils Relative: 1 %
Eosinophils Absolute: 0.5 10*3/uL (ref 0.0–0.5)
Eosinophils Relative: 9 %
HCT: 33.4 % — ABNORMAL LOW (ref 39.0–52.0)
Hemoglobin: 11.1 g/dL — ABNORMAL LOW (ref 13.0–17.0)
Immature Granulocytes: 2 %
Lymphocytes Relative: 7 %
Lymphs Abs: 0.4 10*3/uL — ABNORMAL LOW (ref 0.7–4.0)
MCH: 32.9 pg (ref 26.0–34.0)
MCHC: 33.2 g/dL (ref 30.0–36.0)
MCV: 99.1 fL (ref 80.0–100.0)
Monocytes Absolute: 0.8 10*3/uL (ref 0.1–1.0)
Monocytes Relative: 14 %
Neutro Abs: 3.6 10*3/uL (ref 1.7–7.7)
Neutrophils Relative %: 67 %
Platelets: 184 10*3/uL (ref 150–400)
RBC: 3.37 MIL/uL — ABNORMAL LOW (ref 4.22–5.81)
RDW: 14.5 % (ref 11.5–15.5)
WBC: 5.4 10*3/uL (ref 4.0–10.5)
nRBC: 0 % (ref 0.0–0.2)

## 2020-08-14 LAB — VITAMIN D 25 HYDROXY (VIT D DEFICIENCY, FRACTURES): Vit D, 25-Hydroxy: 54.54 ng/mL (ref 30–100)

## 2020-08-14 MED ORDER — PROCHLORPERAZINE MALEATE 10 MG PO TABS
ORAL_TABLET | ORAL | Status: AC
  Filled 2020-08-14: qty 1

## 2020-08-14 MED ORDER — FAMOTIDINE IN NACL 20-0.9 MG/50ML-% IV SOLN
INTRAVENOUS | Status: AC
  Filled 2020-08-14: qty 50

## 2020-08-14 MED ORDER — ACETAMINOPHEN 325 MG PO TABS
ORAL_TABLET | ORAL | Status: AC
  Filled 2020-08-14: qty 2

## 2020-08-14 MED ORDER — SODIUM CHLORIDE 0.9% FLUSH
10.0000 mL | INTRAVENOUS | Status: DC | PRN
  Administered 2020-08-14: 10 mL
  Filled 2020-08-14: qty 10

## 2020-08-14 MED ORDER — DEXAMETHASONE SODIUM PHOSPHATE 10 MG/ML IJ SOLN
INTRAMUSCULAR | Status: AC
  Filled 2020-08-14: qty 1

## 2020-08-14 MED ORDER — SODIUM CHLORIDE 0.9 % IV SOLN
Freq: Once | INTRAVENOUS | Status: AC
  Filled 2020-08-14: qty 250

## 2020-08-14 MED ORDER — DEXAMETHASONE SODIUM PHOSPHATE 10 MG/ML IJ SOLN
8.0000 mg | Freq: Once | INTRAMUSCULAR | Status: AC
  Administered 2020-08-14: 8 mg via INTRAVENOUS

## 2020-08-14 MED ORDER — FAMOTIDINE IN NACL 20-0.9 MG/50ML-% IV SOLN
20.0000 mg | Freq: Once | INTRAVENOUS | Status: AC
  Administered 2020-08-14: 20 mg via INTRAVENOUS

## 2020-08-14 MED ORDER — DIPHENHYDRAMINE HCL 25 MG PO CAPS
50.0000 mg | ORAL_CAPSULE | Freq: Once | ORAL | Status: AC
  Administered 2020-08-14: 50 mg via ORAL

## 2020-08-14 MED ORDER — DIPHENHYDRAMINE HCL 25 MG PO CAPS
ORAL_CAPSULE | ORAL | Status: AC
  Filled 2020-08-14: qty 2

## 2020-08-14 MED ORDER — SODIUM CHLORIDE 0.9 % IV SOLN
1100.0000 mg | Freq: Once | INTRAVENOUS | Status: AC
  Administered 2020-08-14: 1100 mg via INTRAVENOUS
  Filled 2020-08-14: qty 32

## 2020-08-14 MED ORDER — HEPARIN SOD (PORK) LOCK FLUSH 100 UNIT/ML IV SOLN
500.0000 [IU] | Freq: Once | INTRAVENOUS | Status: AC | PRN
  Administered 2020-08-14: 500 [IU]
  Filled 2020-08-14: qty 5

## 2020-08-14 MED ORDER — ACETAMINOPHEN 325 MG PO TABS
650.0000 mg | ORAL_TABLET | Freq: Once | ORAL | Status: AC
  Administered 2020-08-14: 650 mg via ORAL

## 2020-08-14 MED ORDER — PROCHLORPERAZINE MALEATE 10 MG PO TABS
10.0000 mg | ORAL_TABLET | Freq: Once | ORAL | Status: AC
  Administered 2020-08-14: 10 mg via ORAL

## 2020-08-14 NOTE — Patient Instructions (Signed)
[facility] Discharge Instructions for Patients Receiving Chemotherapy  Today you received the following chemotherapy agents: elotuzumab.   To help prevent nausea and vomiting after your treatment, we encourage you to take your nausea medication as directed.   If you develop nausea and vomiting that is not controlled by your nausea medication, call the clinic.   BELOW ARE SYMPTOMS THAT SHOULD BE REPORTED IMMEDIATELY:  *FEVER GREATER THAN 100.5 F  *CHILLS WITH OR WITHOUT FEVER  NAUSEA AND VOMITING THAT IS NOT CONTROLLED WITH YOUR NAUSEA MEDICATION  *UNUSUAL SHORTNESS OF BREATH  *UNUSUAL BRUISING OR BLEEDING  TENDERNESS IN MOUTH AND THROAT WITH OR WITHOUT PRESENCE OF ULCERS  *URINARY PROBLEMS  *BOWEL PROBLEMS  UNUSUAL RASH Items with * indicate a potential emergency and should be followed up as soon as possible.  Feel free to call the clinic should you have any questions or concerns. The clinic phone number is [phone].  Please show the [facility] at check-in to the Emergency Department and triage nurse.

## 2020-08-14 NOTE — Patient Instructions (Signed)

## 2020-08-20 ENCOUNTER — Telehealth: Payer: Self-pay | Admitting: Hematology

## 2020-08-20 NOTE — Telephone Encounter (Signed)
Scheduled per los, patient has been called and notifiedd

## 2020-08-21 ENCOUNTER — Inpatient Hospital Stay: Payer: Medicare Other

## 2020-08-21 ENCOUNTER — Other Ambulatory Visit: Payer: Self-pay

## 2020-08-21 ENCOUNTER — Inpatient Hospital Stay (HOSPITAL_BASED_OUTPATIENT_CLINIC_OR_DEPARTMENT_OTHER): Payer: Medicare Other | Admitting: Hematology

## 2020-08-21 ENCOUNTER — Inpatient Hospital Stay: Payer: Medicare Other | Attending: Hematology

## 2020-08-21 ENCOUNTER — Other Ambulatory Visit: Payer: Self-pay | Admitting: Hematology

## 2020-08-21 VITALS — BP 145/65 | HR 72 | Temp 97.2°F | Resp 18 | Ht 72.0 in | Wt 251.1 lb

## 2020-08-21 DIAGNOSIS — Z79899 Other long term (current) drug therapy: Secondary | ICD-10-CM | POA: Diagnosis not present

## 2020-08-21 DIAGNOSIS — Z7189 Other specified counseling: Secondary | ICD-10-CM | POA: Diagnosis not present

## 2020-08-21 DIAGNOSIS — C9000 Multiple myeloma not having achieved remission: Secondary | ICD-10-CM | POA: Diagnosis not present

## 2020-08-21 DIAGNOSIS — Z5112 Encounter for antineoplastic immunotherapy: Secondary | ICD-10-CM | POA: Insufficient documentation

## 2020-08-21 DIAGNOSIS — Z23 Encounter for immunization: Secondary | ICD-10-CM

## 2020-08-21 DIAGNOSIS — C9002 Multiple myeloma in relapse: Secondary | ICD-10-CM

## 2020-08-21 DIAGNOSIS — Z95828 Presence of other vascular implants and grafts: Secondary | ICD-10-CM

## 2020-08-21 LAB — CBC WITH DIFFERENTIAL/PLATELET
Abs Immature Granulocytes: 0.13 10*3/uL — ABNORMAL HIGH (ref 0.00–0.07)
Basophils Absolute: 0.1 10*3/uL (ref 0.0–0.1)
Basophils Relative: 2 %
Eosinophils Absolute: 0.2 10*3/uL (ref 0.0–0.5)
Eosinophils Relative: 5 %
HCT: 37.1 % — ABNORMAL LOW (ref 39.0–52.0)
Hemoglobin: 12.4 g/dL — ABNORMAL LOW (ref 13.0–17.0)
Immature Granulocytes: 3 %
Lymphocytes Relative: 4 %
Lymphs Abs: 0.2 10*3/uL — ABNORMAL LOW (ref 0.7–4.0)
MCH: 32.7 pg (ref 26.0–34.0)
MCHC: 33.4 g/dL (ref 30.0–36.0)
MCV: 97.9 fL (ref 80.0–100.0)
Monocytes Absolute: 0.4 10*3/uL (ref 0.1–1.0)
Monocytes Relative: 8 %
Neutro Abs: 3.5 10*3/uL (ref 1.7–7.7)
Neutrophils Relative %: 78 %
Platelets: 163 10*3/uL (ref 150–400)
RBC: 3.79 MIL/uL — ABNORMAL LOW (ref 4.22–5.81)
RDW: 14.6 % (ref 11.5–15.5)
WBC: 4.4 10*3/uL (ref 4.0–10.5)
nRBC: 0 % (ref 0.0–0.2)

## 2020-08-21 LAB — CMP (CANCER CENTER ONLY)
ALT: 12 U/L (ref 0–44)
AST: 9 U/L — ABNORMAL LOW (ref 15–41)
Albumin: 3.6 g/dL (ref 3.5–5.0)
Alkaline Phosphatase: 65 U/L (ref 38–126)
Anion gap: 7 (ref 5–15)
BUN: 10 mg/dL (ref 8–23)
CO2: 25 mmol/L (ref 22–32)
Calcium: 9.1 mg/dL (ref 8.9–10.3)
Chloride: 107 mmol/L (ref 98–111)
Creatinine: 0.79 mg/dL (ref 0.61–1.24)
GFR, Estimated: >60 mL/min (ref >60)
Glucose, Bld: 126 mg/dL — ABNORMAL HIGH (ref 70–99)
Potassium: 3.9 mmol/L (ref 3.5–5.1)
Sodium: 139 mmol/L (ref 135–145)
Total Bilirubin: 0.7 mg/dL (ref 0.3–1.2)
Total Protein: 6.5 g/dL (ref 6.5–8.1)

## 2020-08-21 MED ORDER — INFLUENZA VAC A&B SA ADJ QUAD 0.5 ML IM PRSY
PREFILLED_SYRINGE | INTRAMUSCULAR | Status: AC
  Filled 2020-08-21: qty 0.5

## 2020-08-21 MED ORDER — SODIUM CHLORIDE 0.9% FLUSH
10.0000 mL | INTRAVENOUS | Status: DC | PRN
  Filled 2020-08-21: qty 10

## 2020-08-21 MED ORDER — PROCHLORPERAZINE MALEATE 10 MG PO TABS
10.0000 mg | ORAL_TABLET | Freq: Once | ORAL | Status: AC
  Administered 2020-08-21: 10 mg via ORAL

## 2020-08-21 MED ORDER — FAMOTIDINE IN NACL 20-0.9 MG/50ML-% IV SOLN
20.0000 mg | Freq: Once | INTRAVENOUS | Status: AC
  Administered 2020-08-21: 20 mg via INTRAVENOUS

## 2020-08-21 MED ORDER — ZOLEDRONIC ACID 4 MG/100ML IV SOLN
INTRAVENOUS | Status: AC
  Filled 2020-08-21: qty 100

## 2020-08-21 MED ORDER — ACETAMINOPHEN 325 MG PO TABS
ORAL_TABLET | ORAL | Status: AC
  Filled 2020-08-21: qty 2

## 2020-08-21 MED ORDER — ZOLEDRONIC ACID 4 MG/100ML IV SOLN
4.0000 mg | Freq: Once | INTRAVENOUS | Status: AC
  Administered 2020-08-21: 4 mg via INTRAVENOUS

## 2020-08-21 MED ORDER — PROCHLORPERAZINE MALEATE 10 MG PO TABS
ORAL_TABLET | ORAL | Status: AC
  Filled 2020-08-21: qty 1

## 2020-08-21 MED ORDER — DIPHENHYDRAMINE HCL 25 MG PO CAPS
ORAL_CAPSULE | ORAL | Status: AC
  Filled 2020-08-21: qty 2

## 2020-08-21 MED ORDER — HEPARIN SOD (PORK) LOCK FLUSH 100 UNIT/ML IV SOLN
500.0000 [IU] | Freq: Once | INTRAVENOUS | Status: DC | PRN
  Filled 2020-08-21: qty 5

## 2020-08-21 MED ORDER — SODIUM CHLORIDE 0.9 % IV SOLN
10.1000 mg/kg | Freq: Once | INTRAVENOUS | Status: AC
  Administered 2020-08-21: 1100 mg via INTRAVENOUS
  Filled 2020-08-21: qty 32

## 2020-08-21 MED ORDER — ACETAMINOPHEN 325 MG PO TABS
650.0000 mg | ORAL_TABLET | Freq: Once | ORAL | Status: AC
  Administered 2020-08-21: 650 mg via ORAL

## 2020-08-21 MED ORDER — INFLUENZA VAC A&B SA ADJ QUAD 0.5 ML IM PRSY
0.5000 mL | PREFILLED_SYRINGE | Freq: Once | INTRAMUSCULAR | Status: AC
  Administered 2020-08-21: 0.5 mL via INTRAMUSCULAR

## 2020-08-21 MED ORDER — FAMOTIDINE IN NACL 20-0.9 MG/50ML-% IV SOLN
INTRAVENOUS | Status: AC
  Filled 2020-08-21: qty 50

## 2020-08-21 MED ORDER — DEXAMETHASONE SODIUM PHOSPHATE 10 MG/ML IJ SOLN
INTRAMUSCULAR | Status: AC
  Filled 2020-08-21: qty 1

## 2020-08-21 MED ORDER — DEXAMETHASONE SODIUM PHOSPHATE 10 MG/ML IJ SOLN
8.0000 mg | Freq: Once | INTRAMUSCULAR | Status: AC
  Administered 2020-08-21: 8 mg via INTRAVENOUS

## 2020-08-21 MED ORDER — SODIUM CHLORIDE 0.9 % IV SOLN
Freq: Once | INTRAVENOUS | Status: AC
  Filled 2020-08-21: qty 250

## 2020-08-21 MED ORDER — DIPHENHYDRAMINE HCL 25 MG PO CAPS
50.0000 mg | ORAL_CAPSULE | Freq: Once | ORAL | Status: AC
  Administered 2020-08-21: 50 mg via ORAL

## 2020-08-21 MED ORDER — SODIUM CHLORIDE 0.9% FLUSH
10.0000 mL | INTRAVENOUS | Status: DC | PRN
  Administered 2020-08-21: 10 mL via INTRAVENOUS
  Filled 2020-08-21: qty 10

## 2020-08-21 NOTE — Patient Instructions (Signed)
[facility] Discharge Instructions for Patients Receiving Chemotherapy  Today you received the following chemotherapy agents: empliciti  To help prevent nausea and vomiting after your treatment, we encourage you to take your nausea medication as directed.   If you develop nausea and vomiting that is not controlled by your nausea medication, call the clinic.   BELOW ARE SYMPTOMS THAT SHOULD BE REPORTED IMMEDIATELY:  *FEVER GREATER THAN 100.5 F  *CHILLS WITH OR WITHOUT FEVER  NAUSEA AND VOMITING THAT IS NOT CONTROLLED WITH YOUR NAUSEA MEDICATION  *UNUSUAL SHORTNESS OF BREATH  *UNUSUAL BRUISING OR BLEEDING  TENDERNESS IN MOUTH AND THROAT WITH OR WITHOUT PRESENCE OF ULCERS  *URINARY PROBLEMS  *BOWEL PROBLEMS  UNUSUAL RASH Items with * indicate a potential emergency and should be followed up as soon as possible.  Feel free to call the clinic should you have any questions or concerns. The clinic phone number is [phone].  Please show the [facility] at check-in to the Emergency Department and triage nurse.

## 2020-08-21 NOTE — Progress Notes (Signed)
HEMATOLOGY/ONCOLOGY CLINIC NOTE  Date of Service: 08/21/20     Patient Care Team: [name], MD as PCP - General (Family Medicine) [name], MD as Consulting Physician (Hematology) [name], MD as Attending Physician (Radiation Oncology) [name], RN (Inactive) as Oncology Nurse Navigator (Oncology) [name] MD ([facility] _ primary oncology) phone number [phone] Email- [email or web address]  CHIEF COMPLAINTS  followup of multiple myeloma.  HISTORY OF PRESENTING ILLNESS:   Michael [name] is a wonderful 69 y.o. 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 Patient is currently on a study protocol and is being monitored for myeloma recurrence.  Based on available records being put together  -Patient was diagnosed with IgG kappa ISS stage I multiple myeloma with hyperdiploid complex cytogenetics in 2012. Bone marrow biopsy apparently showed 50% kappa restricted plasma cells with an initial M spike of 3.4 g/dL with evidence of lytic lesions on his PET/CT scan including right rib fracture and lesions of the lumbar and cervical spine. -Patient was treated under protocol 11-C-0221 with from 02/21/2013 with  Carfilzomib/Revlimid/Dexamethasone for a total of 8 cycles. He reports that his stem cells were collected and stored after 4 cycles -Posttreatment bone marrow biopsy showed less than 5% plasma cells with a negative flow cytometry and improvement in his previously PET avid lesions. Some concern for new focus of activity at C2 lytic lesion at L4 and several small lesions throughout the vertebrae. -Patient was on maintenance lenalidomide 10  mg by mouth daily for 2 years or more until end of 2014. 08/19/2013 - bone marrow biopsy showed normocellular marrow with less than 5% plasma cells. PET CT scan showed decreased focal metabolic ligament prior rib lesions and no new lesions. Flow cytometry was negative for residual disease. Serum and urine electrophoresis and IFE showed no evidence of monoclonal protein. -Patient notes that he was off protocol for a few years due to lack of clinical trial research funding. -Patient notes that he is back on a follow-up protocol and continues to follow and receive his primary treatment at [facility] at this time. -Patient reports he had his last PET CT scan blood and urine tests and bone marrow examination in February 2018. The results of which are not available to [place] currently.  He notes that there was concern for a very slowly growing sternal FDG avid lesion that has been present for years. He has a follow-up at [facility] next month to determine the management of this lesion. He reports that was some consideration of considering radiation. he notes that this lesion has not been biopsied. No other focal bone pains at this time.  We discussed in detail about what our role will be and noted that we shall be available for any acute issues that need to be taken care of locally but that at this time the primary treatment and evaluation will be driven by his team at [facility] as per their research protocols and input. The patient and his wife are clearly aware of this and are in agreement with this plan.  INTERVAL HISTORY [name] is here for continued management of his Multiple Myeloma. He is here for C2D22 Elotuzumab. The patient's last visit with [place]  was on 07/31/2020. The pt reports that he is doing well overall.  The pt reports armpit tinging and mild discomfort in his chest and back especially when he moves. He also notes that his abdomen feels stiff, but he has been moving his bowels regularly. Pt  denies any issues taking Pomalyst and is currently on his week off.   Lab results today (08/21/20) of CBC w/diff and CMP is as follows: all values are WNL except for RBC at 3.79, Hgb at 12.4, HCT at 37.1, Lymphs Abs at 0.2K, Abs Immature Granulocytes at 0.13K, Glucose at 126, AST at 9. 08/21/2020 MMP - M spike 0.5g/dl  On review of systems, pt reports increased urination, leg swelling, back/chest discomfort, tinging in armpits and denies dysuria, fevers, chills, night sweats, constipation and any other symptoms.   MEDICAL HISTORY:  Past Medical History:  Diagnosis Date  . Angioedema   . CPAP (continuous positive airway pressure) dependence    uses at night  . Gastroesophageal reflux disease without esophagitis   . History of radiation therapy 06/04/18- 06/15/18   25 Gy in 10 fractions to the laryngeal/ cartilage lesion  . Hyperlipidemia   . Multiple myeloma ([facility]) 2012   Treated at [facility]  . Non morbid obesity due to excess calories   . Prediabetes   . Prediabetes   . Unspecified glaucoma(365.9)   . Urticaria, unspecified   Diabetes mellitus Glaucoma Left toes neuropathy  GERD  SURGICAL HISTORY: Past Surgical History:  Procedure Laterality Date  . IR IMAGING GUIDED PORT INSERTION  06/05/2020  . IR RADIOLOGIST EVAL & MGMT  11/13/2018  . PORTACATH PLACEMENT    . PORTACATH PLACEMENT     removed 02/2016    SOCIAL HISTORY: Social History   Socioeconomic History  . Marital status: Married    Spouse name: Not on file  . Number of children: 2  . Years of education: Not on file  . Highest education level: Not on file  Occupational History  . Occupation: Retired Chief Strategy Officer for [employer]  . Smoking status: Never Smoker  . Smokeless tobacco: Never Used  Vaping Use  . Vaping Use: Never used  Substance and Sexual Activity  . Alcohol use: Yes    Alcohol/week: 0.0 standard drinks    Comment: Occasional beer  . Drug use: No  . Sexual activity: Not Currently  Other  Topics Concern  . Not on file  Social History Narrative   Unable to [employer] partner violence- partner in room    Social Determinants of Health   Financial Resource Strain:   . Difficulty of Paying Living Expenses: Not on file  Food Insecurity:   . Worried About Charity fundraiser in the Last Year: Not on file  . Ran Out of Food in the Last Year: Not on file  Transportation Needs:   . Lack of Transportation (Medical): Not on file  . Lack of Transportation (Non-Medical): Not on file  Physical Activity:   . Days of Exercise per Week: Not on file  . Minutes of Exercise per Session: Not on file  Stress:   . Feeling of Stress : Not on file  Social Connections:   . Frequency of Communication with Friends and Family: Not on file  . Frequency of Social Gatherings with Friends and Family: Not on file  . Attends Religious Services: Not on file  . Active Member of Clubs or Organizations: Not on file  . Attends Archivist Meetings: Not  on file  . Marital Status: Not on file  Intimate Partner Violence:   . Fear of Current or Ex-Partner: Not on file  . Emotionally Abused: Not on file  . Physically Abused: Not on file  . Sexually Abused: Not on file  Never smoker Married Retired from the [employer] in June 2017 and moved to [facility].  FAMILY HISTORY: Family History  Problem Relation Age of Onset  . Cancer Father     ALLERGIES:  has No Known Allergies.  MEDICATIONS:  Current Outpatient Medications  Medication Sig Dispense Refill  . acyclovir (ZOVIRAX) 400 MG tablet Take 1 tablet by mouth twice daily 60 tablet 0  . aspirin 81 MG tablet Take 81 mg by mouth daily.    [name] atorvastatin (LIPITOR) 10 MG tablet Take 10 mg by mouth daily.    . Calcium Citrate-Vitamin D (CALCIUM CITRATE + D3 MAXIMUM PO) Take 630 mg by mouth.     . dexamethasone (DECADRON) 4 MG tablet Take 7 tabs (62m) between 3 & 24 hours prior to chemo. 28 tablet 2  . ergocalciferol  (VITAMIN D2) 1.25 MG (50000 UT) capsule Take 1 capsule (50,000 Units total) by mouth 2 (two) times a week. 24 capsule 3  . gabapentin (NEURONTIN) 300 MG capsule Take 2 capsules (600 mg total) by mouth 3 (three) times daily. 180 capsule 2  . glucose blood (FREESTYLE LITE) test strip     . lidocaine-prilocaine (EMLA) cream Apply topically to port area prior to port access 30 g 3  . LORazepam (ATIVAN) 0.5 MG tablet Take 1 tablet (0.5 mg total) by mouth every 6 (six) hours as needed (Nausea or vomiting). 30 tablet 0  . Multiple Vitamin (MULTIVITAMIN) tablet Take 1 tablet by mouth daily.    . ondansetron (ZOFRAN) 8 MG tablet Take 1 tablet (8 mg total) by mouth 2 (two) times daily as needed (Nausea or vomiting). 30 tablet 1  . oxyCODONE (OXY IR/ROXICODONE) 5 MG immediate release tablet Take 1-2 tablets (5-10 mg total) by mouth every 4 (four) hours as needed for severe pain or breakthrough pain. 60 tablet 0  . pantoprazole (PROTONIX) 40 MG tablet Take 40 mg by mouth daily.     .[name] KitchenPOMALYST 2 MG capsule TAKE 1 CAPSULE DAILY FOR 21 DAYS ON AND 7 DAYS OFF, REPEAT EVERY 28 DAYS. 21 capsule 1  . prochlorperazine (COMPAZINE) 10 MG tablet Take 1 tablet (10 mg total) by mouth every 6 (six) hours as needed (Nausea or vomiting). 30 tablet 1  . senna-docusate (SENOKOT-S) 8.6-50 MG tablet Take 2 tablets by mouth at bedtime. Takes 50 mg (Patient not taking: Reported on 07/31/2020) 60 tablet 3   No current facility-administered medications for this visit.   Facility-Administered Medications Ordered in Other Visits  Medication Dose Route Frequency Provider Last Rate Last Admin  . 0.9 %  sodium chloride infusion   Intravenous Once FTruitt Merle MD      . acetaminophen (TYLENOL) tablet 650 mg  650 mg Oral Once FTruitt Merle MD      . dexamethasone (DECADRON) injection 8 mg  8 mg Intravenous Once KBrunetta Genera MD      . diphenhydrAMINE (BENADRYL) capsule 50 mg  50 mg Oral Once FTruitt Merle MD      . elotuzumab (EMPLICITI)  16,073.7mg in sodium chloride 0.9 % 230 mL chemo infusion  10 mg/kg (Treatment Plan Recorded) Intravenous Once FTruitt Merle MD      . famotidine (PEPCID) IVPB 20 mg  premix  20 mg Intravenous Once Truitt Merle, MD      . heparin lock flush 100 unit/mL  500 Units Intracatheter Once PRN Truitt Merle, MD      . prochlorperazine (COMPAZINE) tablet 10 mg  10 mg Oral Once Truitt Merle, MD      . sodium chloride flush (NS) 0.9 % injection 10 mL  10 mL Intracatheter PRN Truitt Merle, MD        REVIEW OF SYSTEMS:   A 10+ POINT REVIEW OF SYSTEMS WAS OBTAINED including neurology, dermatology, psychiatry, cardiac, respiratory, lymph, extremities, GI, GU, Musculoskeletal, constitutional, breasts, reproductive, HEENT.  All pertinent positives are noted in the HPI.  All others are negative.   PHYSICAL EXAMINATION: ECOG PERFORMANCE STATUS: 1 - Symptomatic but completely ambulatory  Vitals:   08/21/20 1236  BP: (!) 145/65  Pulse: 72  Resp: 18  Temp: (!) 97.2 F (36.2 C)  TempSrc: Tympanic  SpO2: 99%  Weight: 251 lb 1.6 oz (113.9 kg)  Height: 6' (1.829 m)   Body mass index is 34.06 kg/m.   Exam was given in a chair   GENERAL:alert, in no acute distress and comfortable SKIN: no acute rashes, no significant lesions EYES: conjunctiva are pink and non-injected, sclera anicteric OROPHARYNX: MMM, no exudates, no oropharyngeal erythema or ulceration NECK: supple, no JVD LYMPH:  no palpable lymphadenopathy in the cervical, axillary or inguinal regions LUNGS: clear to auscultation b/l with normal respiratory effort HEART: regular rate & rhythm ABDOMEN:  normoactive bowel sounds , non tender, not distended. No palpable hepatosplenomegaly.  Extremity: trace pedal edema b/l PSYCH: alert & oriented x 3 with fluent speech NEURO: no focal motor/sensory deficits  LABORATORY DATA:  I have reviewed the data as listed . CBC Latest Ref Rng & Units 08/21/2020 08/14/2020 08/07/2020  WBC 4.0 - 10.5 K/uL 4.4 5.4 5.9   Hemoglobin 13.0 - 17.0 g/dL 12.4(L) 11.1(L) 11.7(L)  Hematocrit 39 - 52 % 37.1(L) 33.4(L) 34.9(L)  Platelets 150 - 400 K/uL 163 184 192    . CMP Latest Ref Rng & Units 08/21/2020 08/14/2020 08/07/2020  Glucose 70 - 99 mg/dL 126(H) 150(H) 231(H)  BUN 8 - 23 mg/dL 10 13 13   Creatinine 0.61 - 1.24 mg/dL 0.79 0.76 0.92  Sodium 135 - 145 mmol/L 139 139 137  Potassium 3.5 - 5.1 mmol/L 3.9 4.0 4.2  Chloride 98 - 111 mmol/L 107 107 105  CO2 22 - 32 mmol/L 25 26 25   Calcium 8.9 - 10.3 mg/dL 9.1 9.0 9.2  Total Protein 6.5 - 8.1 g/dL 6.5 5.9(L) 6.2(L)  Total Bilirubin 0.3 - 1.2 mg/dL 0.7 0.8 1.0  Alkaline Phos 38 - 126 U/L 65 65 74  AST 15 - 41 U/L 9(L) 12(L) 13(L)  ALT 0 - 44 U/L 12 19 13    06/04/2020 [facility] Bone Marrow Report ([id number])::   06/04/2020 [facility] Flow Cytometry [phone]):   08/16/2019 BM Bx Report ([id number]):   08/16/2019 FISH Panel:      05/08/18 Left Neck Thyroid Cartilage Biopsy:        RADIOGRAPHIC STUDIES: I have personally reviewed the radiological images as listed and agreed with the findings in the report. No results found.  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 Bone marrow biopsy apparently showed 50% kappa restricted plasma cells with an initial M spike of 3.4 g/dL with  evidence of lytic lesions on his PET/CT scan including right rib fracture and lesions of the lumbar and cervical spine. -Patient was treated under protocol 11-C-0221 with from 02/21/2013 with  Carfilzomib/Revlimid/Dexamethasone for a total of 8 cycles. He reports that his stem cells were collected and stored after 4 cycles -Posttreatment bone marrow biopsy showed less than 5% plasma cells with a negative flow cytometry and improvement in his previously PET avid lesions. Some concern for new focus of activity at C2  lytic lesion at L4 and several small lesions throughout the vertebrae. -Patient was on maintenance lenalidomide 10 mg by mouth daily for 2 years or more until end of 2014. 08/19/2013 - bone marrow biopsy showed normocellular marrow with less than 5% plasma cells. PET CT scan showed decreased focal metabolic ligament prior rib lesions and no new lesions. Flow cytometry was negative for residual disease. Serum and urine electrophoresis and IFE showed no evidence of monoclonal protein. -Patient notes that he was off protocol for a few years due to lack of clinical trial research funding. -Patient returned to follow-up protocol and began to follow and receive his primary treatment at [facility]  -Patient's labs show minimal IgG kappa protein on IFE and a CT chest in May 2018 that did not show any overt signs of myeloma progression in the bones.  Rpt Myeloma labs 03/2017 - M spike of 0.3g/dl with a repeat M spike stable at 0.3 g/dL on 05/23/2017 which remains stable on labs today 07/25/17 Serum kappa lambda free light chain ratio not significantly changed. PET/CT scan showed a single metabolically active sternal lesion without any overt bony destruction. Bone marrow biopsy done on 06/06/2017 - shows no overt involvement with multiple myeloma.  04/24/18 CT Soft Tissue Neck revealed Left laryngeal 3 cm soft tissue mass appears to have originated within and is expanding the left thyroid cartilage. This is new since the August 2018 PET-CT, and there is absent lymphadenopathy. 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 Primary cartilaginous neoplasm, squamous cell carcinoma, and other differential considerations are less likely. Superimposed widespread multiple myeloma lesions in the visible skeleton.  05/08/18 Left neck thyroid cartilage biopsy revealed Plasma cell neoplasm.  05/22/18 PET/CT revealed Soft tissue mass centered around the destroyed left thyroid cartilage is hypermetabolic  and consistent with known plasmacytoma. No adenopathy in the neck. 2. Hypermetabolic 3 cm cutaneous and subcutaneous soft tissue mass involving the posterior upper thorax suspicious for cutaneous manifestation of myeloma (plasmacytoma). 3. Persistent and slightly progressive hypermetabolic sternal lesion. 4. Diffuse lytic myelomatous lesions throughout the bony structures but no other hypermetabolic foci. 5. Focus of hypermetabolism in the left aspect of the lower prostate gland, not present on the prior study. This could be an area of infection/inflammation or potential prostate cancer. Recommend correlation with physical examination and PSA level.    Completed RT between 06/04/18 and 06/15/18 of 25 Gy by 10 fractions  The pt pursued a clinical trial at the [facility] with RT and immunotherapy in October 2019 through October 18, 2018 with further information available at [email or web address]  10/18/18 M Protein increased to 3.5g, from 0.7g at our last visit on 07/10/18. 10/02/18 PET/CT from [facility] indicated new hypermetabolism in left clavicle, left scapula, bilateral ribs, spine, and left proximal femur 10/18/18 MRI from [facility] ndicated a pathologic fracture at T12  10/2018 M Protein at 4.1g  11/23/18 NM Bone revealed Patchy uptake throughout the ribs and spine with areas of focally increased activity in both T12 pedicles and  the right L1 pedicle. This focal activity could be stress related without clear corresponding fracture on available prior studies.  08/16/2019 FISH Panel which revealed "No mutations detected."  08/16/2019 [facility] Surgical Pathology [phone]) which revealed  "DIAGNOSIS: BONE MARROW, ASPIRATE, CLOT, CORE: -Hypercellular bone marrow for age with trilineage hematopoiesis and less than 1% plasma cells PERIPHERAL BLOOD: -Macrocytic anemia -Leukocytosis ".  09/03/2019 PET/CT scan ([id number]) revealed  "1. No evidence of active multiple myeloma on whole-body FDG PET scan. 2.  Stable lytic lesions within the spine and sternum consistent treated metastasis. 3. No soft tissue plasmacytoma. 4. New compression fracture at T12 compared to PET-CT scan 10/02/2018."  06/24/2020 MRI Brain ([id number]) revealed "Myelomatous lesion spanning the midline occipital calvarium with bony destruction. There is mild epidural extension abutting the superior sagittal sinus without invasion or significant compression. Suspected additional myelomatous involvement of the clivus adjacent to the posterior floor of the left sphenoid sinus. Contiguous enhancing tissue within the sinus could reflect extraosseous extension of disease or unrelated sinus inflammatory disease."  2) h/o Elevated bilirubin level . ? [place]  3) PET/CT abnormality in the Prostate. PSA levels WNL -Primary care physician to consider urology referral  4) Abnormal focus of FDG avid at a spot in the liver - will need to be monitor on f/u scans. No pain or other symptoms at this time.  PLAN:  -Discussed pt labwork today, 08/21/20; Hgb has improved, WBC & PLT are nml.  -Discussed 08/21/2020 MMP is in progress, last M Protein down to 0.6 g/dL -Continue Pomalidomide 3 weeks on/1 week off. Pt has no prohibitive toxicities.   -The pt has no prohibitive toxicities from continuing C2D22 Elotuzumab at this time.  -Advised pt that beginning with C3 we will continue infusions monthly.  -Recommend pt receive the annual flu vaccine. Will give in clinic today.  -Advised pt that his increased urination could be due to the change in weather & proper hydration.  -Advised pt that his underarm tingling is likely from tumors pressing on nerves. This should continue to improve. -Advised pt that we will continue treatment, unless Dr. Veva Holes determines that enough has been done and is ready to go for transplant. -Pt is following with Dr. [facility] at [facility] on 11/11.  -Continue twice weekly 50K UT Vitamin D -Continue 600 mg Gabapentin TID    -Continue Zometa q68month -Refill Pomalidomide  -Will see back in 5 weeks with labs   FOLLOW UP: Plz schedule C3 and C4 of Elotuzumab treatment as ordered Labs with each treatment MD visit in 5 weeks with C4D1 of treatment   The total time spent in the appt was 30 minutes and more than 50% was on counseling and direct patient cares, ordering and mx of myeloma  All of the patient's questions were answered with apparent satisfaction. The patient knows to call the clinic with any problems, questions or concerns.    GSullivan LoneMD MSouth BethanyAAHIVMS SLakewood Regional Medical CenterCAthol Memorial HospitalHematology/Oncology Physician CMississippi Coast Endoscopy And [facility] (Office):       [phone](Work cell):  [phone](Fax):           [phone] I, JYevette Edwards am acting as a scribe for Dr. GSullivan Lone   .I have reviewed the above documentation for accuracy and completeness, and I agree with the above. .Brunetta GeneraMD

## 2020-08-24 DIAGNOSIS — D1801 Hemangioma of skin and subcutaneous tissue: Secondary | ICD-10-CM | POA: Diagnosis not present

## 2020-08-24 DIAGNOSIS — L570 Actinic keratosis: Secondary | ICD-10-CM | POA: Diagnosis not present

## 2020-08-24 DIAGNOSIS — L821 Other seborrheic keratosis: Secondary | ICD-10-CM | POA: Diagnosis not present

## 2020-08-24 DIAGNOSIS — L820 Inflamed seborrheic keratosis: Secondary | ICD-10-CM | POA: Diagnosis not present

## 2020-08-24 LAB — MULTIPLE MYELOMA PANEL, SERUM
Albumin SerPl Elph-Mcnc: 3.4 g/dL (ref 2.9–4.4)
Albumin/Glob SerPl: 1.3 (ref 0.7–1.7)
Alpha 1: 0.3 g/dL (ref 0.0–0.4)
Alpha2 Glob SerPl Elph-Mcnc: 0.7 g/dL (ref 0.4–1.0)
B-Globulin SerPl Elph-Mcnc: 0.9 g/dL (ref 0.7–1.3)
Gamma Glob SerPl Elph-Mcnc: 0.7 g/dL (ref 0.4–1.8)
Globulin, Total: 2.7 g/dL (ref 2.2–3.9)
IgA: 6 mg/dL — ABNORMAL LOW (ref 61–437)
IgG (Immunoglobin G), Serum: 731 mg/dL (ref 603–1613)
IgM (Immunoglobulin M), Srm: <5 mg/dL — ABNORMAL LOW (ref 20–172)
M Protein SerPl Elph-Mcnc: 0.5 g/dL — ABNORMAL HIGH
Total Protein ELP: 6.1 g/dL (ref 6.0–8.5)

## 2020-08-26 ENCOUNTER — Other Ambulatory Visit: Payer: Self-pay | Admitting: Hematology

## 2020-08-27 DIAGNOSIS — M25519 Pain in unspecified shoulder: Secondary | ICD-10-CM | POA: Diagnosis not present

## 2020-08-27 DIAGNOSIS — Z79899 Other long term (current) drug therapy: Secondary | ICD-10-CM | POA: Diagnosis not present

## 2020-08-27 DIAGNOSIS — C9000 Multiple myeloma not having achieved remission: Secondary | ICD-10-CM | POA: Diagnosis not present

## 2020-08-27 DIAGNOSIS — R7303 Prediabetes: Secondary | ICD-10-CM | POA: Diagnosis not present

## 2020-08-27 DIAGNOSIS — M549 Dorsalgia, unspecified: Secondary | ICD-10-CM | POA: Diagnosis not present

## 2020-08-27 DIAGNOSIS — G629 Polyneuropathy, unspecified: Secondary | ICD-10-CM | POA: Diagnosis not present

## 2020-08-28 ENCOUNTER — Inpatient Hospital Stay: Payer: Medicare Other

## 2020-08-28 ENCOUNTER — Other Ambulatory Visit: Payer: Self-pay

## 2020-08-28 ENCOUNTER — Inpatient Hospital Stay: Payer: Medicare Other | Admitting: Hematology

## 2020-08-28 VITALS — BP 139/80 | HR 86 | Temp 98.1°F | Resp 16 | Wt 244.0 lb

## 2020-08-28 DIAGNOSIS — C9000 Multiple myeloma not having achieved remission: Secondary | ICD-10-CM

## 2020-08-28 DIAGNOSIS — Z5112 Encounter for antineoplastic immunotherapy: Secondary | ICD-10-CM

## 2020-08-28 DIAGNOSIS — Z79899 Other long term (current) drug therapy: Secondary | ICD-10-CM | POA: Diagnosis not present

## 2020-08-28 DIAGNOSIS — Z23 Encounter for immunization: Secondary | ICD-10-CM | POA: Diagnosis not present

## 2020-08-28 DIAGNOSIS — Z7189 Other specified counseling: Secondary | ICD-10-CM

## 2020-08-28 DIAGNOSIS — E559 Vitamin D deficiency, unspecified: Secondary | ICD-10-CM

## 2020-08-28 LAB — CMP (CANCER CENTER ONLY)
ALT: 14 U/L (ref 0–44)
AST: 11 U/L — ABNORMAL LOW (ref 15–41)
Albumin: 3.6 g/dL (ref 3.5–5.0)
Alkaline Phosphatase: 74 U/L (ref 38–126)
Anion gap: 10 (ref 5–15)
BUN: 10 mg/dL (ref 8–23)
CO2: 22 mmol/L (ref 22–32)
Calcium: 9.1 mg/dL (ref 8.9–10.3)
Chloride: 106 mmol/L (ref 98–111)
Creatinine: 0.78 mg/dL (ref 0.61–1.24)
GFR, Estimated: >60 mL/min (ref >60)
Glucose, Bld: 135 mg/dL — ABNORMAL HIGH (ref 70–99)
Potassium: 3.9 mmol/L (ref 3.5–5.1)
Sodium: 138 mmol/L (ref 135–145)
Total Bilirubin: 0.8 mg/dL (ref 0.3–1.2)
Total Protein: 6.6 g/dL (ref 6.5–8.1)

## 2020-08-28 LAB — CBC WITH DIFFERENTIAL/PLATELET
Abs Immature Granulocytes: 0.06 10*3/uL (ref 0.00–0.07)
Basophils Absolute: 0.1 10*3/uL (ref 0.0–0.1)
Basophils Relative: 2 %
Eosinophils Absolute: 0.1 10*3/uL (ref 0.0–0.5)
Eosinophils Relative: 2 %
HCT: 37.9 % — ABNORMAL LOW (ref 39.0–52.0)
Hemoglobin: 12.5 g/dL — ABNORMAL LOW (ref 13.0–17.0)
Immature Granulocytes: 1 %
Lymphocytes Relative: 4 %
Lymphs Abs: 0.2 10*3/uL — ABNORMAL LOW (ref 0.7–4.0)
MCH: 32.6 pg (ref 26.0–34.0)
MCHC: 33 g/dL (ref 30.0–36.0)
MCV: 98.7 fL (ref 80.0–100.0)
Monocytes Absolute: 0.4 10*3/uL (ref 0.1–1.0)
Monocytes Relative: 7 %
Neutro Abs: 4.3 10*3/uL (ref 1.7–7.7)
Neutrophils Relative %: 84 %
Platelets: 207 10*3/uL (ref 150–400)
RBC: 3.84 MIL/uL — ABNORMAL LOW (ref 4.22–5.81)
RDW: 14.2 % (ref 11.5–15.5)
WBC: 5.1 10*3/uL (ref 4.0–10.5)
nRBC: 0 % (ref 0.0–0.2)

## 2020-08-28 LAB — VITAMIN D 25 HYDROXY (VIT D DEFICIENCY, FRACTURES): Vit D, 25-Hydroxy: 47.26 ng/mL (ref 30–100)

## 2020-08-28 MED ORDER — SODIUM CHLORIDE 0.9 % IV SOLN
Freq: Once | INTRAVENOUS | Status: AC
  Filled 2020-08-28: qty 250

## 2020-08-28 MED ORDER — HEPARIN SOD (PORK) LOCK FLUSH 100 UNIT/ML IV SOLN
500.0000 [IU] | Freq: Once | INTRAVENOUS | Status: AC | PRN
  Administered 2020-08-28: 500 [IU]
  Filled 2020-08-28: qty 5

## 2020-08-28 MED ORDER — SODIUM CHLORIDE 0.9 % IV SOLN
20.2300 mg/kg | Freq: Once | INTRAVENOUS | Status: AC
  Administered 2020-08-28: 2200 mg via INTRAVENOUS
  Filled 2020-08-28: qty 80

## 2020-08-28 MED ORDER — PROCHLORPERAZINE MALEATE 10 MG PO TABS
ORAL_TABLET | ORAL | Status: AC
  Filled 2020-08-28: qty 1

## 2020-08-28 MED ORDER — ACETAMINOPHEN 325 MG PO TABS
650.0000 mg | ORAL_TABLET | Freq: Once | ORAL | Status: AC
  Administered 2020-08-28: 650 mg via ORAL

## 2020-08-28 MED ORDER — SODIUM CHLORIDE 0.9% FLUSH
10.0000 mL | INTRAVENOUS | Status: DC | PRN
  Administered 2020-08-28: 10 mL
  Filled 2020-08-28: qty 10

## 2020-08-28 MED ORDER — ACETAMINOPHEN 325 MG PO TABS
ORAL_TABLET | ORAL | Status: AC
  Filled 2020-08-28: qty 2

## 2020-08-28 MED ORDER — FAMOTIDINE IN NACL 20-0.9 MG/50ML-% IV SOLN
INTRAVENOUS | Status: AC
  Filled 2020-08-28: qty 50

## 2020-08-28 MED ORDER — FAMOTIDINE IN NACL 20-0.9 MG/50ML-% IV SOLN
20.0000 mg | Freq: Once | INTRAVENOUS | Status: AC
  Administered 2020-08-28: 20 mg via INTRAVENOUS

## 2020-08-28 MED ORDER — DEXAMETHASONE SODIUM PHOSPHATE 10 MG/ML IJ SOLN
8.0000 mg | Freq: Once | INTRAMUSCULAR | Status: AC
  Administered 2020-08-28: 8 mg via INTRAVENOUS

## 2020-08-28 MED ORDER — DIPHENHYDRAMINE HCL 25 MG PO CAPS
50.0000 mg | ORAL_CAPSULE | Freq: Once | ORAL | Status: AC
  Administered 2020-08-28: 50 mg via ORAL

## 2020-08-28 MED ORDER — PROCHLORPERAZINE MALEATE 10 MG PO TABS
10.0000 mg | ORAL_TABLET | Freq: Once | ORAL | Status: AC
  Administered 2020-08-28: 10 mg via ORAL

## 2020-08-28 MED ORDER — DEXAMETHASONE SODIUM PHOSPHATE 10 MG/ML IJ SOLN
INTRAMUSCULAR | Status: AC
  Filled 2020-08-28: qty 1

## 2020-08-28 MED ORDER — DIPHENHYDRAMINE HCL 25 MG PO CAPS
ORAL_CAPSULE | ORAL | Status: AC
  Filled 2020-08-28: qty 2

## 2020-08-28 NOTE — Patient Instructions (Signed)
[facility] Discharge Instructions for Patients Receiving Chemotherapy  Today you received the following chemotherapy agents: elotuzumab.  To help prevent nausea and vomiting after your treatment, we encourage you to take your nausea medication as directed.   If you develop nausea and vomiting that is not controlled by your nausea medication, call the clinic.   BELOW ARE SYMPTOMS THAT SHOULD BE REPORTED IMMEDIATELY:  *FEVER GREATER THAN 100.5 F  *CHILLS WITH OR WITHOUT FEVER  NAUSEA AND VOMITING THAT IS NOT CONTROLLED WITH YOUR NAUSEA MEDICATION  *UNUSUAL SHORTNESS OF BREATH  *UNUSUAL BRUISING OR BLEEDING  TENDERNESS IN MOUTH AND THROAT WITH OR WITHOUT PRESENCE OF ULCERS  *URINARY PROBLEMS  *BOWEL PROBLEMS  UNUSUAL RASH Items with * indicate a potential emergency and should be followed up as soon as possible.  Feel free to call the clinic should you have any questions or concerns. The clinic phone number is [phone].  Please show the [facility] at check-in to the Emergency Department and triage nurse.

## 2020-08-28 NOTE — Patient Instructions (Signed)

## 2020-08-30 ENCOUNTER — Other Ambulatory Visit: Payer: Self-pay | Admitting: Hematology

## 2020-08-30 ENCOUNTER — Encounter: Payer: Self-pay | Admitting: Hematology

## 2020-08-30 DIAGNOSIS — Z7189 Other specified counseling: Secondary | ICD-10-CM

## 2020-08-30 DIAGNOSIS — C9000 Multiple myeloma not having achieved remission: Secondary | ICD-10-CM

## 2020-08-31 LAB — MULTIPLE MYELOMA PANEL, SERUM
Albumin SerPl Elph-Mcnc: 3.3 g/dL (ref 2.9–4.4)
Albumin/Glob SerPl: 1.2 (ref 0.7–1.7)
Alpha 1: 0.2 g/dL (ref 0.0–0.4)
Alpha2 Glob SerPl Elph-Mcnc: 0.9 g/dL (ref 0.4–1.0)
B-Globulin SerPl Elph-Mcnc: 0.9 g/dL (ref 0.7–1.3)
Gamma Glob SerPl Elph-Mcnc: 0.8 g/dL (ref 0.4–1.8)
Globulin, Total: 2.8 g/dL (ref 2.2–3.9)
IgA: 5 mg/dL — ABNORMAL LOW (ref 61–437)
IgG (Immunoglobin G), Serum: 813 mg/dL (ref 603–1613)
IgM (Immunoglobulin M), Srm: <5 mg/dL — ABNORMAL LOW (ref 20–172)
M Protein SerPl Elph-Mcnc: 0.6 g/dL — ABNORMAL HIGH
Total Protein ELP: 6.1 g/dL (ref 6.0–8.5)

## 2020-08-31 MED ORDER — ACYCLOVIR 400 MG PO TABS: 400.0000 mg | ORAL_TABLET | Freq: Two times a day (BID) | ORAL | 1 refills | Status: DC

## 2020-09-18 ENCOUNTER — Other Ambulatory Visit: Payer: Self-pay | Admitting: Hematology

## 2020-09-18 DIAGNOSIS — Z7189 Other specified counseling: Secondary | ICD-10-CM

## 2020-09-18 DIAGNOSIS — C9000 Multiple myeloma not having achieved remission: Secondary | ICD-10-CM

## 2020-09-18 NOTE — Telephone Encounter (Signed)
Is this ok to refill?  

## 2020-09-21 ENCOUNTER — Encounter: Payer: Self-pay | Admitting: Hematology

## 2020-09-21 ENCOUNTER — Other Ambulatory Visit: Payer: Self-pay | Admitting: *Deleted

## 2020-09-21 DIAGNOSIS — C9000 Multiple myeloma not having achieved remission: Secondary | ICD-10-CM

## 2020-09-21 DIAGNOSIS — Z7189 Other specified counseling: Secondary | ICD-10-CM

## 2020-09-21 MED ORDER — POMALIDOMIDE 2 MG PO CAPS: 2.0000 mg | ORAL_CAPSULE | Freq: Every day | ORAL | 1 refills | Status: DC

## 2020-09-21 NOTE — Telephone Encounter (Signed)
Patient requested refill of Pomalyst Refilled per Dr. Irene Limbo OV note 08/21/20  Escribed to [facility] auth# [id number], 09/21/20
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
## 2020-09-25 ENCOUNTER — Inpatient Hospital Stay: Payer: Medicare Other

## 2020-09-25 ENCOUNTER — Inpatient Hospital Stay: Payer: Medicare Other | Attending: Hematology

## 2020-09-25 ENCOUNTER — Other Ambulatory Visit: Payer: Self-pay

## 2020-09-25 ENCOUNTER — Inpatient Hospital Stay (HOSPITAL_BASED_OUTPATIENT_CLINIC_OR_DEPARTMENT_OTHER): Payer: Medicare Other | Admitting: Hematology

## 2020-09-25 VITALS — BP 129/63 | HR 71 | Temp 97.5°F | Resp 18 | Ht 72.0 in | Wt 252.4 lb

## 2020-09-25 DIAGNOSIS — C9002 Multiple myeloma in relapse: Secondary | ICD-10-CM

## 2020-09-25 DIAGNOSIS — Z79899 Other long term (current) drug therapy: Secondary | ICD-10-CM | POA: Insufficient documentation

## 2020-09-25 DIAGNOSIS — C9000 Multiple myeloma not having achieved remission: Secondary | ICD-10-CM | POA: Insufficient documentation

## 2020-09-25 DIAGNOSIS — Z7189 Other specified counseling: Secondary | ICD-10-CM

## 2020-09-25 DIAGNOSIS — Z5112 Encounter for antineoplastic immunotherapy: Secondary | ICD-10-CM | POA: Diagnosis not present

## 2020-09-25 DIAGNOSIS — Z95828 Presence of other vascular implants and grafts: Secondary | ICD-10-CM | POA: Insufficient documentation

## 2020-09-25 LAB — CMP (CANCER CENTER ONLY)
ALT: 15 U/L (ref 0–44)
AST: 14 U/L — ABNORMAL LOW (ref 15–41)
Albumin: 3.4 g/dL — ABNORMAL LOW (ref 3.5–5.0)
Alkaline Phosphatase: 70 U/L (ref 38–126)
Anion gap: 7 (ref 5–15)
BUN: 11 mg/dL (ref 8–23)
CO2: 23 mmol/L (ref 22–32)
Calcium: 8.6 mg/dL — ABNORMAL LOW (ref 8.9–10.3)
Chloride: 111 mmol/L (ref 98–111)
Creatinine: 0.8 mg/dL (ref 0.61–1.24)
GFR, Estimated: >60 mL/min (ref >60)
Glucose, Bld: 140 mg/dL — ABNORMAL HIGH (ref 70–99)
Potassium: 3.5 mmol/L (ref 3.5–5.1)
Sodium: 141 mmol/L (ref 135–145)
Total Bilirubin: 0.7 mg/dL (ref 0.3–1.2)
Total Protein: 6.7 g/dL (ref 6.5–8.1)

## 2020-09-25 LAB — CBC WITH DIFFERENTIAL/PLATELET
Abs Immature Granulocytes: 0.03 10*3/uL (ref 0.00–0.07)
Basophils Absolute: 0.1 10*3/uL (ref 0.0–0.1)
Basophils Relative: 3 %
Eosinophils Absolute: 0.2 10*3/uL (ref 0.0–0.5)
Eosinophils Relative: 7 %
HCT: 35.6 % — ABNORMAL LOW (ref 39.0–52.0)
Hemoglobin: 12 g/dL — ABNORMAL LOW (ref 13.0–17.0)
Immature Granulocytes: 1 %
Lymphocytes Relative: 17 %
Lymphs Abs: 0.5 10*3/uL — ABNORMAL LOW (ref 0.7–4.0)
MCH: 32.5 pg (ref 26.0–34.0)
MCHC: 33.7 g/dL (ref 30.0–36.0)
MCV: 96.5 fL (ref 80.0–100.0)
Monocytes Absolute: 0.6 10*3/uL (ref 0.1–1.0)
Monocytes Relative: 17 %
Neutro Abs: 1.7 10*3/uL (ref 1.7–7.7)
Neutrophils Relative %: 55 %
Platelets: 220 10*3/uL (ref 150–400)
RBC: 3.69 MIL/uL — ABNORMAL LOW (ref 4.22–5.81)
RDW: 14 % (ref 11.5–15.5)
WBC: 3.2 10*3/uL — ABNORMAL LOW (ref 4.0–10.5)
nRBC: 0 % (ref 0.0–0.2)

## 2020-09-25 MED ORDER — SODIUM CHLORIDE 0.9 % IV SOLN
Freq: Once | INTRAVENOUS | Status: AC
  Filled 2020-09-25: qty 250

## 2020-09-25 MED ORDER — DEXAMETHASONE SODIUM PHOSPHATE 10 MG/ML IJ SOLN
8.0000 mg | Freq: Once | INTRAMUSCULAR | Status: AC
  Administered 2020-09-25: 8 mg via INTRAVENOUS

## 2020-09-25 MED ORDER — HEPARIN SOD (PORK) LOCK FLUSH 100 UNIT/ML IV SOLN
500.0000 [IU] | Freq: Once | INTRAVENOUS | Status: DC | PRN
  Filled 2020-09-25: qty 5

## 2020-09-25 MED ORDER — ACETAMINOPHEN 325 MG PO TABS
650.0000 mg | ORAL_TABLET | Freq: Once | ORAL | Status: AC
  Administered 2020-09-25: 650 mg via ORAL

## 2020-09-25 MED ORDER — PROCHLORPERAZINE MALEATE 10 MG PO TABS
ORAL_TABLET | ORAL | Status: AC
  Filled 2020-09-25: qty 1

## 2020-09-25 MED ORDER — FAMOTIDINE IN NACL 20-0.9 MG/50ML-% IV SOLN
INTRAVENOUS | Status: AC
  Filled 2020-09-25: qty 50

## 2020-09-25 MED ORDER — DIPHENHYDRAMINE HCL 25 MG PO CAPS
ORAL_CAPSULE | ORAL | Status: AC
  Filled 2020-09-25: qty 2

## 2020-09-25 MED ORDER — SODIUM CHLORIDE 0.9 % IV SOLN
2200.0000 mg | Freq: Once | INTRAVENOUS | Status: AC
  Administered 2020-09-25: 2200 mg via INTRAVENOUS
  Filled 2020-09-25: qty 64

## 2020-09-25 MED ORDER — PROCHLORPERAZINE MALEATE 10 MG PO TABS
10.0000 mg | ORAL_TABLET | Freq: Once | ORAL | Status: AC
  Administered 2020-09-25: 10 mg via ORAL

## 2020-09-25 MED ORDER — SODIUM CHLORIDE 0.9% FLUSH
10.0000 mL | INTRAVENOUS | Status: DC | PRN
  Filled 2020-09-25: qty 10

## 2020-09-25 MED ORDER — DEXAMETHASONE SODIUM PHOSPHATE 10 MG/ML IJ SOLN
INTRAMUSCULAR | Status: AC
  Filled 2020-09-25: qty 1

## 2020-09-25 MED ORDER — ACETAMINOPHEN 325 MG PO TABS
ORAL_TABLET | ORAL | Status: AC
  Filled 2020-09-25: qty 2

## 2020-09-25 MED ORDER — SODIUM CHLORIDE 0.9% FLUSH
10.0000 mL | Freq: Once | INTRAVENOUS | Status: AC
  Administered 2020-09-25: 10 mL
  Filled 2020-09-25: qty 10

## 2020-09-25 MED ORDER — FAMOTIDINE IN NACL 20-0.9 MG/50ML-% IV SOLN
20.0000 mg | Freq: Once | INTRAVENOUS | Status: AC
  Administered 2020-09-25: 20 mg via INTRAVENOUS

## 2020-09-25 MED ORDER — DIPHENHYDRAMINE HCL 25 MG PO CAPS
50.0000 mg | ORAL_CAPSULE | Freq: Once | ORAL | Status: AC
  Administered 2020-09-25: 50 mg via ORAL

## 2020-09-25 NOTE — Progress Notes (Signed)
Pt discharged in no apparent distress. Pt left ambulatory without assistance. Pt aware of discharge instructions and verbalized understanding and had no further questions.  

## 2020-09-25 NOTE — Patient Instructions (Signed)
[facility] Discharge Instructions for Patients Receiving Chemotherapy  Today you received the following chemotherapy agents: elotuzumab.  To help prevent nausea and vomiting after your treatment, we encourage you to take your nausea medication as directed.   If you develop nausea and vomiting that is not controlled by your nausea medication, call the clinic.   BELOW ARE SYMPTOMS THAT SHOULD BE REPORTED IMMEDIATELY:  *FEVER GREATER THAN 100.5 F  *CHILLS WITH OR WITHOUT FEVER  NAUSEA AND VOMITING THAT IS NOT CONTROLLED WITH YOUR NAUSEA MEDICATION  *UNUSUAL SHORTNESS OF BREATH  *UNUSUAL BRUISING OR BLEEDING  TENDERNESS IN MOUTH AND THROAT WITH OR WITHOUT PRESENCE OF ULCERS  *URINARY PROBLEMS  *BOWEL PROBLEMS  UNUSUAL RASH Items with * indicate a potential emergency and should be followed up as soon as possible.  Feel free to call the clinic should you have any questions or concerns. The clinic phone number is [phone].  Please show the [facility] at check-in to the Emergency Department and triage nurse.

## 2020-09-29 LAB — MULTIPLE MYELOMA PANEL, SERUM
Albumin SerPl Elph-Mcnc: 3.7 g/dL (ref 2.9–4.4)
Albumin/Glob SerPl: 1.2 (ref 0.7–1.7)
Alpha 1: 0.2 g/dL (ref 0.0–0.4)
Alpha2 Glob SerPl Elph-Mcnc: 0.8 g/dL (ref 0.4–1.0)
B-Globulin SerPl Elph-Mcnc: 0.9 g/dL (ref 0.7–1.3)
Gamma Glob SerPl Elph-Mcnc: 1.4 g/dL (ref 0.4–1.8)
Globulin, Total: 3.3 g/dL (ref 2.2–3.9)
IgA: 5 mg/dL — ABNORMAL LOW (ref 61–437)
IgG (Immunoglobin G), Serum: 1367 mg/dL (ref 603–1613)
IgM (Immunoglobulin M), Srm: <5 mg/dL — ABNORMAL LOW (ref 20–172)
M Protein SerPl Elph-Mcnc: 1.1 g/dL — ABNORMAL HIGH
Total Protein ELP: 7 g/dL (ref 6.0–8.5)

## 2020-10-06 ENCOUNTER — Encounter: Payer: Self-pay | Admitting: Hematology

## 2020-10-14 ENCOUNTER — Other Ambulatory Visit: Payer: Self-pay | Admitting: *Deleted

## 2020-10-14 DIAGNOSIS — C9000 Multiple myeloma not having achieved remission: Secondary | ICD-10-CM

## 2020-10-14 DIAGNOSIS — Z7189 Other specified counseling: Secondary | ICD-10-CM

## 2020-10-14 DIAGNOSIS — C9002 Multiple myeloma in relapse: Secondary | ICD-10-CM

## 2020-10-14 MED ORDER — ACYCLOVIR 400 MG PO TABS: 400.0000 mg | ORAL_TABLET | Freq: Two times a day (BID) | ORAL | 0 refills | Status: DC

## 2020-10-14 MED ORDER — ACYCLOVIR 400 MG PO TABS: 400.0000 mg | ORAL_TABLET | Freq: Two times a day (BID) | ORAL | 1 refills | Status: AC

## 2020-10-14 MED ORDER — ACYCLOVIR 400 MG PO TABS: 400.0000 mg | ORAL_TABLET | Freq: Two times a day (BID) | ORAL | 0 refills | Status: AC

## 2020-10-14 NOTE — Telephone Encounter (Signed)
Patient requests refill of Acyclovir sent to [facility]. Prescription sent. He also asked for a 1 week supply sent to [facility] on [address] to allow time for delivery from [facility] [facility]. Prescription sent

## 2020-10-14 NOTE — Telephone Encounter (Signed)
Patient requests refill of Acyclovir sent to [facility]. He asked for a 1 week supply [facility] to [facility] on [address] to allow time for delivery from [employer].

## 2020-10-15 ENCOUNTER — Encounter: Payer: Self-pay | Admitting: Hematology

## 2020-10-17 ENCOUNTER — Other Ambulatory Visit: Payer: Self-pay | Admitting: Hematology

## 2020-10-17 DIAGNOSIS — C9000 Multiple myeloma not having achieved remission: Secondary | ICD-10-CM

## 2020-10-17 DIAGNOSIS — Z7189 Other specified counseling: Secondary | ICD-10-CM

## 2020-10-19 ENCOUNTER — Other Ambulatory Visit: Payer: Self-pay | Admitting: *Deleted

## 2020-10-19 DIAGNOSIS — C9000 Multiple myeloma not having achieved remission: Secondary | ICD-10-CM

## 2020-10-19 DIAGNOSIS — Z7189 Other specified counseling: Secondary | ICD-10-CM

## 2020-10-19 MED ORDER — POMALIDOMIDE 2 MG PO CAPS: 2.0000 mg | ORAL_CAPSULE | Freq: Every day | ORAL | 0 refills | Status: AC

## 2020-10-21 ENCOUNTER — Encounter: Payer: Self-pay | Admitting: Hematology

## 2020-10-21 DIAGNOSIS — R6510 Systemic inflammatory response syndrome (SIRS) of non-infectious origin without acute organ dysfunction: Secondary | ICD-10-CM | POA: Diagnosis not present

## 2020-10-21 DIAGNOSIS — G4733 Obstructive sleep apnea (adult) (pediatric): Secondary | ICD-10-CM | POA: Diagnosis not present

## 2020-10-21 DIAGNOSIS — C9000 Multiple myeloma not having achieved remission: Secondary | ICD-10-CM | POA: Diagnosis not present

## 2020-10-21 DIAGNOSIS — Z1159 Encounter for screening for other viral diseases: Secondary | ICD-10-CM | POA: Diagnosis not present

## 2020-10-22 ENCOUNTER — Telehealth: Payer: Self-pay | Admitting: *Deleted

## 2020-10-22 NOTE — Telephone Encounter (Addendum)
Responding to patient's MyChart message - spoke with wife. She had symptoms of runny nose/congestion and watery eyes this week. The visiting nurse (comes monthly) arranged for both of them to have Covid test done Wednesday (1/5) morning. Ms. Rocchi was started on an antibiotic.  Should have results some time on Friday. Patient has lab/Appt with Dr. Sallyanne Havers on Friday. Should he come to [facility] or wait on test results before he comes? Dr. Candise Che informed. Per Dr. Candise Che - hold off on coming to [facility] until test results are in. Can be rescheduled for appts the first of next week. Gave patient and wife this information. He will call office with results on Friday if available.
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
## 2020-10-23 ENCOUNTER — Inpatient Hospital Stay: Payer: Medicare Other

## 2020-10-23 ENCOUNTER — Inpatient Hospital Stay (HOSPITAL_BASED_OUTPATIENT_CLINIC_OR_DEPARTMENT_OTHER): Payer: Medicare Other | Admitting: Hematology

## 2020-10-23 ENCOUNTER — Inpatient Hospital Stay: Payer: Medicare Other | Admitting: Hematology

## 2020-10-23 ENCOUNTER — Other Ambulatory Visit: Payer: Self-pay | Admitting: Hematology

## 2020-10-23 ENCOUNTER — Inpatient Hospital Stay: Payer: Medicare Other | Attending: Hematology

## 2020-10-23 ENCOUNTER — Other Ambulatory Visit: Payer: Self-pay

## 2020-10-23 VITALS — BP 127/66 | HR 73 | Temp 98.4°F | Resp 18

## 2020-10-23 DIAGNOSIS — Z79899 Other long term (current) drug therapy: Secondary | ICD-10-CM | POA: Insufficient documentation

## 2020-10-23 DIAGNOSIS — Z5112 Encounter for antineoplastic immunotherapy: Secondary | ICD-10-CM | POA: Insufficient documentation

## 2020-10-23 DIAGNOSIS — Z7189 Other specified counseling: Secondary | ICD-10-CM

## 2020-10-23 DIAGNOSIS — C9002 Multiple myeloma in relapse: Secondary | ICD-10-CM

## 2020-10-23 DIAGNOSIS — C9000 Multiple myeloma not having achieved remission: Secondary | ICD-10-CM | POA: Insufficient documentation

## 2020-10-23 LAB — CBC WITH DIFFERENTIAL/PLATELET
Abs Immature Granulocytes: 0.05 10*3/uL (ref 0.00–0.07)
Basophils Absolute: 0.1 10*3/uL (ref 0.0–0.1)
Basophils Relative: 3 %
Eosinophils Absolute: 0.2 10*3/uL (ref 0.0–0.5)
Eosinophils Relative: 9 %
HCT: 35.3 % — ABNORMAL LOW (ref 39.0–52.0)
Hemoglobin: 12 g/dL — ABNORMAL LOW (ref 13.0–17.0)
Immature Granulocytes: 2 %
Lymphocytes Relative: 19 %
Lymphs Abs: 0.5 10*3/uL — ABNORMAL LOW (ref 0.7–4.0)
MCH: 31.6 pg (ref 26.0–34.0)
MCHC: 34 g/dL (ref 30.0–36.0)
MCV: 92.9 fL (ref 80.0–100.0)
Monocytes Absolute: 0.4 10*3/uL (ref 0.1–1.0)
Monocytes Relative: 13 %
Neutro Abs: 1.6 10*3/uL — ABNORMAL LOW (ref 1.7–7.7)
Neutrophils Relative %: 54 %
Platelets: 161 10*3/uL (ref 150–400)
RBC: 3.8 MIL/uL — ABNORMAL LOW (ref 4.22–5.81)
RDW: 13.8 % (ref 11.5–15.5)
WBC: 2.8 10*3/uL — ABNORMAL LOW (ref 4.0–10.5)
nRBC: 0.7 % — ABNORMAL HIGH (ref 0.0–0.2)

## 2020-10-23 LAB — COMPREHENSIVE METABOLIC PANEL
ALT: 21 U/L (ref 0–44)
AST: 19 U/L (ref 15–41)
Albumin: 3.4 g/dL — ABNORMAL LOW (ref 3.5–5.0)
Alkaline Phosphatase: 74 U/L (ref 38–126)
Anion gap: 6 (ref 5–15)
BUN: 13 mg/dL (ref 8–23)
CO2: 23 mmol/L (ref 22–32)
Calcium: 10.1 mg/dL (ref 8.9–10.3)
Chloride: 107 mmol/L (ref 98–111)
Creatinine, Ser: 0.84 mg/dL (ref 0.61–1.24)
GFR, Estimated: >60 mL/min (ref >60)
Glucose, Bld: 168 mg/dL — ABNORMAL HIGH (ref 70–99)
Potassium: 3.9 mmol/L (ref 3.5–5.1)
Sodium: 136 mmol/L (ref 135–145)
Total Bilirubin: 0.9 mg/dL (ref 0.3–1.2)
Total Protein: 8.5 g/dL — ABNORMAL HIGH (ref 6.5–8.1)

## 2020-10-23 MED ORDER — DIPHENHYDRAMINE HCL 25 MG PO CAPS
50.0000 mg | ORAL_CAPSULE | Freq: Once | ORAL | Status: AC
  Administered 2020-10-23: 50 mg via ORAL

## 2020-10-23 MED ORDER — FAMOTIDINE IN NACL 20-0.9 MG/50ML-% IV SOLN
20.0000 mg | Freq: Once | INTRAVENOUS | Status: AC
  Administered 2020-10-23: 20 mg via INTRAVENOUS

## 2020-10-23 MED ORDER — PROCHLORPERAZINE MALEATE 10 MG PO TABS
10.0000 mg | ORAL_TABLET | Freq: Once | ORAL | Status: AC
Start: 2020-10-23 — End: 2020-10-23
  Administered 2020-10-23: 10 mg via ORAL

## 2020-10-23 MED ORDER — SODIUM CHLORIDE 0.9 % IV SOLN
Freq: Once | INTRAVENOUS | Status: AC
  Filled 2020-10-23: qty 250

## 2020-10-23 MED ORDER — DEXAMETHASONE SODIUM PHOSPHATE 10 MG/ML IJ SOLN
INTRAMUSCULAR | Status: AC
  Filled 2020-10-23: qty 1

## 2020-10-23 MED ORDER — ACETAMINOPHEN 325 MG PO TABS
ORAL_TABLET | ORAL | Status: AC
  Filled 2020-10-23: qty 2

## 2020-10-23 MED ORDER — SODIUM CHLORIDE 0.9 % IV SOLN
20.2300 mg/kg | Freq: Once | INTRAVENOUS | Status: AC
  Administered 2020-10-23: 2200 mg via INTRAVENOUS
  Filled 2020-10-23: qty 64

## 2020-10-23 MED ORDER — ACETAMINOPHEN 325 MG PO TABS
650.0000 mg | ORAL_TABLET | Freq: Once | ORAL | Status: AC
  Administered 2020-10-23: 650 mg via ORAL

## 2020-10-23 MED ORDER — DEXAMETHASONE SODIUM PHOSPHATE 10 MG/ML IJ SOLN
8.0000 mg | Freq: Once | INTRAMUSCULAR | Status: AC
  Administered 2020-10-23: 8 mg via INTRAVENOUS

## 2020-10-23 MED ORDER — PROCHLORPERAZINE MALEATE 10 MG PO TABS
ORAL_TABLET | ORAL | Status: AC
  Filled 2020-10-23: qty 1

## 2020-10-23 MED ORDER — HEPARIN SOD (PORK) LOCK FLUSH 100 UNIT/ML IV SOLN
500.0000 [IU] | Freq: Once | INTRAVENOUS | Status: AC | PRN
  Administered 2020-10-23: 500 [IU]
  Filled 2020-10-23: qty 5

## 2020-10-23 MED ORDER — FAMOTIDINE IN NACL 20-0.9 MG/50ML-% IV SOLN
INTRAVENOUS | Status: AC
  Filled 2020-10-23: qty 50

## 2020-10-23 MED ORDER — SODIUM CHLORIDE 0.9% FLUSH
10.0000 mL | INTRAVENOUS | Status: DC | PRN
  Administered 2020-10-23: 10 mL
  Filled 2020-10-23: qty 10

## 2020-10-23 MED ORDER — DIPHENHYDRAMINE HCL 25 MG PO CAPS
ORAL_CAPSULE | ORAL | Status: AC
  Filled 2020-10-23: qty 2

## 2020-10-23 NOTE — Telephone Encounter (Signed)
Talked with Ms. Ferrin [phone]. She and Mr. Senft received test results this AM - negative Flu and Covid tests. Mr. Beaulieu on his way to [place] for AM appts.
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
## 2020-10-23 NOTE — Patient Instructions (Signed)
[facility] °Discharge Instructions for Patients Receiving Chemotherapy ° °Today you received the following chemotherapy agents: elotuzumab. ° °To help prevent nausea and vomiting after your treatment, we encourage you to take your nausea medication as directed. °  °If you develop nausea and vomiting that is not controlled by your nausea medication, call the clinic.  ° °BELOW ARE SYMPTOMS THAT SHOULD BE REPORTED IMMEDIATELY: °· *FEVER GREATER THAN 100.5 F °· *CHILLS WITH OR WITHOUT FEVER °· NAUSEA AND VOMITING THAT IS NOT CONTROLLED WITH YOUR NAUSEA MEDICATION °· *UNUSUAL SHORTNESS OF BREATH °· *UNUSUAL BRUISING OR BLEEDING °· TENDERNESS IN MOUTH AND THROAT WITH OR WITHOUT PRESENCE OF ULCERS °· *URINARY PROBLEMS °· *BOWEL PROBLEMS °· UNUSUAL RASH °Items with * indicate a potential emergency and should be followed up as soon as possible. ° °Feel free to call the clinic should you have any questions or concerns. The clinic phone number is [phone]. ° °Please show the CHEMO ALERT CARD at check-in to the Emergency Department and triage nurse. ° ° °

## 2020-10-23 NOTE — Progress Notes (Signed)
Per Dr. Irene Limbo, ok to proceed with premeds. Dr. Irene Limbo to see patient in infusion.

## 2020-10-23 NOTE — Patient Instructions (Signed)

## 2020-10-26 LAB — MULTIPLE MYELOMA PANEL, SERUM
Albumin SerPl Elph-Mcnc: 3.6 g/dL (ref 2.9–4.4)
Albumin/Glob SerPl: 0.9 (ref 0.7–1.7)
Alpha 1: 0.2 g/dL (ref 0.0–0.4)
Alpha2 Glob SerPl Elph-Mcnc: 0.9 g/dL (ref 0.4–1.0)
B-Globulin SerPl Elph-Mcnc: 0.9 g/dL (ref 0.7–1.3)
Gamma Glob SerPl Elph-Mcnc: 2.5 g/dL — ABNORMAL HIGH (ref 0.4–1.8)
Globulin, Total: 4.5 g/dL — ABNORMAL HIGH (ref 2.2–3.9)
IgA: <5 mg/dL — ABNORMAL LOW (ref 61–437)
IgG (Immunoglobin G), Serum: 3037 mg/dL — ABNORMAL HIGH (ref 603–1613)
IgM (Immunoglobulin M), Srm: <5 mg/dL — ABNORMAL LOW (ref 20–172)
M Protein SerPl Elph-Mcnc: 2.2 g/dL — ABNORMAL HIGH
Total Protein ELP: 8.1 g/dL (ref 6.0–8.5)

## 2020-10-28 ENCOUNTER — Ambulatory Visit: Payer: Medicare Other | Admitting: Hematology

## 2020-10-28 ENCOUNTER — Other Ambulatory Visit: Payer: Self-pay

## 2020-10-28 ENCOUNTER — Inpatient Hospital Stay: Payer: Medicare Other

## 2020-10-28 ENCOUNTER — Ambulatory Visit: Payer: Medicare Other

## 2020-10-28 ENCOUNTER — Other Ambulatory Visit: Payer: Medicare Other

## 2020-10-28 DIAGNOSIS — C9000 Multiple myeloma not having achieved remission: Secondary | ICD-10-CM | POA: Diagnosis not present

## 2020-10-28 DIAGNOSIS — Z5112 Encounter for antineoplastic immunotherapy: Secondary | ICD-10-CM

## 2020-10-28 DIAGNOSIS — Z95828 Presence of other vascular implants and grafts: Secondary | ICD-10-CM

## 2020-10-28 DIAGNOSIS — C9002 Multiple myeloma in relapse: Secondary | ICD-10-CM

## 2020-10-28 DIAGNOSIS — Z79899 Other long term (current) drug therapy: Secondary | ICD-10-CM | POA: Diagnosis not present

## 2020-10-28 DIAGNOSIS — Z7189 Other specified counseling: Secondary | ICD-10-CM

## 2020-10-28 LAB — CMP (CANCER CENTER ONLY)
ALT: 14 U/L (ref 0–44)
AST: 16 U/L (ref 15–41)
Albumin: 3.2 g/dL — ABNORMAL LOW (ref 3.5–5.0)
Alkaline Phosphatase: 66 U/L (ref 38–126)
Anion gap: 4 — ABNORMAL LOW (ref 5–15)
BUN: 9 mg/dL (ref 8–23)
CO2: 24 mmol/L (ref 22–32)
Calcium: 10.1 mg/dL (ref 8.9–10.3)
Chloride: 108 mmol/L (ref 98–111)
Creatinine: 0.91 mg/dL (ref 0.61–1.24)
GFR, Estimated: >60 mL/min (ref >60)
Glucose, Bld: 145 mg/dL — ABNORMAL HIGH (ref 70–99)
Potassium: 3.8 mmol/L (ref 3.5–5.1)
Sodium: 136 mmol/L (ref 135–145)
Total Bilirubin: 1.1 mg/dL (ref 0.3–1.2)
Total Protein: 8.2 g/dL — ABNORMAL HIGH (ref 6.5–8.1)

## 2020-10-28 LAB — CBC WITH DIFFERENTIAL/PLATELET
Abs Immature Granulocytes: 0.24 10*3/uL — ABNORMAL HIGH (ref 0.00–0.07)
Basophils Absolute: 0.1 10*3/uL (ref 0.0–0.1)
Basophils Relative: 3 %
Eosinophils Absolute: 0.2 10*3/uL (ref 0.0–0.5)
Eosinophils Relative: 9 %
HCT: 32.1 % — ABNORMAL LOW (ref 39.0–52.0)
Hemoglobin: 10.8 g/dL — ABNORMAL LOW (ref 13.0–17.0)
Immature Granulocytes: 9 %
Lymphocytes Relative: 17 %
Lymphs Abs: 0.5 10*3/uL — ABNORMAL LOW (ref 0.7–4.0)
MCH: 31.3 pg (ref 26.0–34.0)
MCHC: 33.6 g/dL (ref 30.0–36.0)
MCV: 93 fL (ref 80.0–100.0)
Monocytes Absolute: 0.4 10*3/uL (ref 0.1–1.0)
Monocytes Relative: 14 %
Neutro Abs: 1.3 10*3/uL — ABNORMAL LOW (ref 1.7–7.7)
Neutrophils Relative %: 48 %
Platelets: 168 10*3/uL (ref 150–400)
RBC: 3.45 MIL/uL — ABNORMAL LOW (ref 4.22–5.81)
RDW: 14.3 % (ref 11.5–15.5)
WBC: 2.7 10*3/uL — ABNORMAL LOW (ref 4.0–10.5)
nRBC: 0 % (ref 0.0–0.2)

## 2020-10-28 MED ORDER — HEPARIN SOD (PORK) LOCK FLUSH 100 UNIT/ML IV SOLN
500.0000 [IU] | Freq: Once | INTRAVENOUS | Status: AC
  Administered 2020-10-28: 500 [IU]
  Filled 2020-10-28: qty 5

## 2020-10-28 MED ORDER — SODIUM CHLORIDE 0.9% FLUSH
10.0000 mL | Freq: Once | INTRAVENOUS | Status: AC
  Administered 2020-10-28: 10 mL
  Filled 2020-10-28: qty 10

## 2020-10-28 NOTE — Patient Instructions (Signed)

## 2020-11-02 ENCOUNTER — Telehealth: Payer: Self-pay | Admitting: *Deleted

## 2020-11-02 ENCOUNTER — Other Ambulatory Visit: Payer: Self-pay | Admitting: *Deleted

## 2020-11-02 NOTE — Telephone Encounter (Signed)
error 

## 2020-11-02 NOTE — Telephone Encounter (Signed)
Received Telephone Advice fax from after hours [facility]. Fax dated 11/01/20 7:14PM. Call 11/01/20 658 PM: Patient called with severe pain in ribs.Said he could feel them cracking. Was advised to go to ED or call 911. Contacted patient - spoke with spouse. She stated they called 911 and EMS came to house. EMS checked patient - O2 100% on room air; lungs clear; pulse 85; b/p 126/80; no fever. They did not see need to transport patient to ED. He was able to reduce pain with oxycodone as prescribed. Has appt with Dr. Aris Lot at [facility] on Thursday.  Requested refill of oxycodone - refill sent to MD  Call routed to Dr. Irene Limbo

## 2020-11-03 ENCOUNTER — Other Ambulatory Visit: Payer: Self-pay | Admitting: *Deleted

## 2020-11-03 DIAGNOSIS — C9002 Multiple myeloma in relapse: Secondary | ICD-10-CM

## 2020-11-03 DIAGNOSIS — C9000 Multiple myeloma not having achieved remission: Secondary | ICD-10-CM

## 2020-11-03 MED ORDER — OXYCODONE HCL 5 MG PO TABS: 5.0000 mg | ORAL_TABLET | ORAL | 0 refills | Status: AC | PRN

## 2020-11-03 MED ORDER — GABAPENTIN 300 MG PO CAPS: 600.0000 mg | ORAL_CAPSULE | Freq: Three times a day (TID) | ORAL | 2 refills | Status: AC

## 2020-11-03 NOTE — Telephone Encounter (Signed)
Received faxed refill request for 90 day supply of Gabapentin from [facility] Prescription refill request routed to Dr Irene Limbo

## 2020-11-05 ENCOUNTER — Telehealth: Payer: Self-pay

## 2020-11-05 DIAGNOSIS — C9000 Multiple myeloma not having achieved remission: Secondary | ICD-10-CM | POA: Diagnosis not present

## 2020-11-05 DIAGNOSIS — E1140 Type 2 diabetes mellitus with diabetic neuropathy, unspecified: Secondary | ICD-10-CM | POA: Diagnosis not present

## 2020-11-05 DIAGNOSIS — Z7952 Long term (current) use of systemic steroids: Secondary | ICD-10-CM | POA: Diagnosis not present

## 2020-11-05 DIAGNOSIS — Z7982 Long term (current) use of aspirin: Secondary | ICD-10-CM | POA: Diagnosis not present

## 2020-11-05 DIAGNOSIS — Z79899 Other long term (current) drug therapy: Secondary | ICD-10-CM | POA: Diagnosis not present

## 2020-11-05 DIAGNOSIS — M549 Dorsalgia, unspecified: Secondary | ICD-10-CM | POA: Diagnosis not present

## 2020-11-05 DIAGNOSIS — M79602 Pain in left arm: Secondary | ICD-10-CM | POA: Diagnosis not present

## 2020-11-05 NOTE — Telephone Encounter (Signed)
Received message below from Dr. Irene Limbo.  Hi Brodrick Curran-- talked with Dr Aris Lot --- could we plz schedule Mr Michael [name] for labs, IVF (1NS) and Zometa in 6-7 days for hypercalcemia. thanks High priority scheduling message sent to get patient scheduled for labs, IVF, and Zometa next week. Also called patient and made him aware to expect a call from the scheduling department.

## 2020-11-06 ENCOUNTER — Telehealth: Payer: Self-pay

## 2020-11-06 DIAGNOSIS — M25511 Pain in right shoulder: Secondary | ICD-10-CM | POA: Diagnosis not present

## 2020-11-06 NOTE — Telephone Encounter (Signed)
Received message that pt. needed a call back to clarify his infusion appt. because he believed he was no longer getting treatments. [name] pt. and explained that his upcoming appt. on Tuesday 1/25 was for Lab draw and the infusion appt. was to get IVF/Zometa and not chemo. Pt. verbalized agreement with this.

## 2020-11-10 ENCOUNTER — Inpatient Hospital Stay: Payer: Medicare Other

## 2020-11-10 ENCOUNTER — Other Ambulatory Visit: Payer: Self-pay

## 2020-11-10 ENCOUNTER — Other Ambulatory Visit: Payer: Self-pay | Admitting: Hematology

## 2020-11-10 VITALS — BP 126/64 | HR 71 | Temp 98.7°F | Resp 18

## 2020-11-10 DIAGNOSIS — Z5112 Encounter for antineoplastic immunotherapy: Secondary | ICD-10-CM | POA: Diagnosis not present

## 2020-11-10 DIAGNOSIS — C9002 Multiple myeloma in relapse: Secondary | ICD-10-CM

## 2020-11-10 DIAGNOSIS — Z95828 Presence of other vascular implants and grafts: Secondary | ICD-10-CM

## 2020-11-10 DIAGNOSIS — Z7189 Other specified counseling: Secondary | ICD-10-CM

## 2020-11-10 DIAGNOSIS — Z79899 Other long term (current) drug therapy: Secondary | ICD-10-CM | POA: Diagnosis not present

## 2020-11-10 DIAGNOSIS — C9000 Multiple myeloma not having achieved remission: Secondary | ICD-10-CM

## 2020-11-10 DIAGNOSIS — E860 Dehydration: Secondary | ICD-10-CM

## 2020-11-10 LAB — CBC WITH DIFFERENTIAL/PLATELET
Abs Immature Granulocytes: 0.17 10*3/uL — ABNORMAL HIGH (ref 0.00–0.07)
Basophils Absolute: 0.1 10*3/uL (ref 0.0–0.1)
Basophils Relative: 2 %
Eosinophils Absolute: 0.2 10*3/uL (ref 0.0–0.5)
Eosinophils Relative: 6 %
HCT: 35.3 % — ABNORMAL LOW (ref 39.0–52.0)
Hemoglobin: 11.7 g/dL — ABNORMAL LOW (ref 13.0–17.0)
Immature Granulocytes: 4 %
Lymphocytes Relative: 15 %
Lymphs Abs: 0.6 10*3/uL — ABNORMAL LOW (ref 0.7–4.0)
MCH: 30.4 pg (ref 26.0–34.0)
MCHC: 33.1 g/dL (ref 30.0–36.0)
MCV: 91.7 fL (ref 80.0–100.0)
Monocytes Absolute: 0.7 10*3/uL (ref 0.1–1.0)
Monocytes Relative: 19 %
Neutro Abs: 2.2 10*3/uL (ref 1.7–7.7)
Neutrophils Relative %: 54 %
Platelets: 163 10*3/uL (ref 150–400)
RBC: 3.85 MIL/uL — ABNORMAL LOW (ref 4.22–5.81)
RDW: 14.3 % (ref 11.5–15.5)
WBC: 3.9 10*3/uL — ABNORMAL LOW (ref 4.0–10.5)
nRBC: 0.8 % — ABNORMAL HIGH (ref 0.0–0.2)

## 2020-11-10 LAB — CMP (CANCER CENTER ONLY)
ALT: 15 U/L (ref 0–44)
AST: 19 U/L (ref 15–41)
Albumin: 3.4 g/dL — ABNORMAL LOW (ref 3.5–5.0)
Alkaline Phosphatase: 90 U/L (ref 38–126)
Anion gap: 7 (ref 5–15)
BUN: 10 mg/dL (ref 8–23)
CO2: 21 mmol/L — ABNORMAL LOW (ref 22–32)
Calcium: 11.1 mg/dL — ABNORMAL HIGH (ref 8.9–10.3)
Chloride: 108 mmol/L (ref 98–111)
Creatinine: 0.96 mg/dL (ref 0.61–1.24)
GFR, Estimated: >60 mL/min (ref >60)
Glucose, Bld: 162 mg/dL — ABNORMAL HIGH (ref 70–99)
Potassium: 3.8 mmol/L (ref 3.5–5.1)
Sodium: 136 mmol/L (ref 135–145)
Total Bilirubin: 1.2 mg/dL (ref 0.3–1.2)
Total Protein: 9.9 g/dL — ABNORMAL HIGH (ref 6.5–8.1)

## 2020-11-10 MED ORDER — HEPARIN SOD (PORK) LOCK FLUSH 100 UNIT/ML IV SOLN
500.0000 [IU] | Freq: Once | INTRAVENOUS | Status: AC
  Administered 2020-11-10: 500 [IU]
  Filled 2020-11-10: qty 5

## 2020-11-10 MED ORDER — ZOLEDRONIC ACID 4 MG/100ML IV SOLN: 4.0000 mg | Freq: Once | INTRAVENOUS | Status: AC

## 2020-11-10 MED ORDER — SODIUM CHLORIDE 0.9 % IV SOLN
INTRAVENOUS | Status: AC
  Filled 2020-11-10: qty 250

## 2020-11-10 MED ORDER — ZOLEDRONIC ACID 4 MG/100ML IV SOLN
INTRAVENOUS | Status: AC
  Filled 2020-11-10: qty 100

## 2020-11-10 MED ORDER — ZOLEDRONIC ACID 4 MG/100ML IV SOLN
4.0000 mg | Freq: Once | INTRAVENOUS | Status: AC
  Administered 2020-11-10: 4 mg via INTRAVENOUS

## 2020-11-10 MED ORDER — SODIUM CHLORIDE 0.9 % IV SOLN
Freq: Once | INTRAVENOUS | Status: AC
  Filled 2020-11-10: qty 250

## 2020-11-10 MED ORDER — SODIUM CHLORIDE 0.9% FLUSH
10.0000 mL | Freq: Once | INTRAVENOUS | Status: AC
Start: 2020-11-10 — End: 2020-11-10
  Administered 2020-11-10: 10 mL
  Filled 2020-11-10: qty 10

## 2020-11-10 MED ORDER — SODIUM CHLORIDE 0.9% FLUSH
20.0000 mL | Freq: Once | INTRAVENOUS | Status: AC
  Administered 2020-11-10: 20 mL via INTRAVENOUS
  Filled 2020-11-10: qty 20

## 2020-11-10 NOTE — Patient Instructions (Signed)
Zoledronic Acid Injection (Hypercalcemia, Oncology) What is this medicine? ZOLEDRONIC ACID (ZOE le dron ik AS id) slows calcium loss from bones. It high calcium levels in the blood from some kinds of cancer. It may be used in other people at risk for bone loss. This medicine may be used for other purposes; ask your health care provider or pharmacist if you have questions. COMMON BRAND NAME(S): Zometa What should I tell my health care provider before I take this medicine? They need to know if you have any of these conditions:  cancer  dehydration  dental disease  kidney disease  liver disease  low levels of calcium in the blood  lung or breathing disease (asthma)  receiving steroids like dexamethasone or prednisone  an unusual or allergic reaction to zoledronic acid, other medicines, foods, dyes, or preservatives  pregnant or trying to get pregnant  breast-feeding How should I use this medicine? This drug is injected into a vein. It is given by a health care provider in a hospital or clinic setting. Talk to your health care provider about the use of this drug in children. Special care may be needed. Overdosage: If you think you have taken too much of this medicine contact a poison control center or emergency room at once. NOTE: This medicine is only for you. Do not share this medicine with others. What if I miss a dose? Keep appointments for follow-up doses. It is important not to miss your dose. Call your health care provider if you are unable to keep an appointment. What may interact with this medicine?  certain antibiotics given by injection  NSAIDs, medicines for pain and inflammation, like ibuprofen or naproxen  some diuretics like bumetanide, furosemide  teriparatide  thalidomide This list may not describe all possible interactions. Give your health care provider a list of all the medicines, herbs, non-prescription drugs, or dietary supplements you use. Also tell  them if you smoke, drink alcohol, or use illegal drugs. Some items may interact with your medicine. What should I watch for while using this medicine? Visit your health care provider for regular checks on your progress. It may be some time before you see the benefit from this drug. Some people who take this drug have severe bone, joint, or muscle pain. This drug may also increase your risk for jaw problems or a broken thigh bone. Tell your health care provider right away if you have severe pain in your jaw, bones, joints, or muscles. Tell you health care provider if you have any pain that does not go away or that gets worse. Tell your dentist and dental surgeon that you are taking this drug. You should not have major dental surgery while on this drug. See your dentist to have a dental exam and fix any dental problems before starting this drug. Take good care of your teeth while on this drug. Make sure you see your dentist for regular follow-up appointments. You should make sure you get enough calcium and vitamin D while you are taking this drug. Discuss the foods you eat and the vitamins you take with your health care provider. Check with your health care provider if you have severe diarrhea, nausea, and vomiting, or if you sweat a lot. The loss of too much body fluid may make it dangerous for you to take this drug. You may need blood work done while you are taking this drug. Do not become pregnant while taking this drug. Women should inform their health care provider   if they wish to become pregnant or think they might be pregnant. There is potential for serious harm to an unborn child. Talk to your health care provider for more information. What side effects may I notice from receiving this medicine? Side effects that you should report to your doctor or health care provider as soon as possible:  allergic reactions (skin rash, itching or hives; swelling of the face, lips, or tongue)  bone  pain  infection (fever, chills, cough, sore throat, pain or trouble passing urine)  jaw pain, especially after dental work  joint pain  kidney injury (trouble passing urine or change in the amount of urine)  low blood pressure (dizziness; feeling faint or lightheaded, falls; unusually weak or tired)  low calcium levels (fast heartbeat; muscle cramps or pain; pain, tingling, or numbness in the hands or feet; seizures)  low magnesium levels (fast, irregular heartbeat; muscle cramp or pain; muscle weakness; tremors; seizures)  low red blood cell counts (trouble breathing; feeling faint; lightheaded, falls; unusually weak or tired)  muscle pain  redness, blistering, peeling, or loosening of the skin, including inside the mouth  severe diarrhea  swelling of the ankles, feet, hands  trouble breathing Side effects that usually do not require medical attention (report to your doctor or health care provider if they continue or are bothersome):  anxious  constipation  coughing  depressed mood  eye irritation, itching, or pain  fever  general ill feeling or flu-like symptoms  nausea  pain, redness, or irritation at site where injected  trouble sleeping This list may not describe all possible side effects. Call your doctor for medical advice about side effects. You may report side effects to FDA at 1-800-FDA-1088. Where should I keep my medicine? This drug is given in a hospital or clinic. It will not be stored at home. NOTE: This sheet is a summary. It may not cover all possible information. If you have questions about this medicine, talk to your doctor, pharmacist, or health care provider.  2021 Elsevier/Gold Standard (2019-07-18 09:13:00)   Rehydration, Adult Rehydration is the replacement of body fluids, salts, and minerals (electrolytes) that are lost during dehydration. Dehydration is when there is not enough water or other fluids in the body. This happens when you  lose more fluids than you take in. Common causes of dehydration include:  Not drinking enough fluids. This can occur when you are ill or doing activities that require a lot of energy, especially in hot weather.  Conditions that cause loss of water or other fluids, such as diarrhea, vomiting, sweating, or urinating a lot.  Other illnesses, such as fever or infection.  Certain medicines, such as those that remove excess fluid from the body (diuretics). Symptoms of mild or moderate dehydration may include thirst, dry lips and mouth, and dizziness. Symptoms of severe dehydration may include increased heart rate, confusion, fainting, and not urinating. For severe dehydration, you may need to get fluids through an IV at the hospital. For mild or moderate dehydration, you can usually rehydrate at home by drinking certain fluids as told by your health care provider. What are the risks? Generally, rehydration is safe. However, taking in too much fluid (overhydration) can be a problem. This is rare. Overhydration can cause an electrolyte imbalance, kidney failure, or a decrease in salt (sodium) levels in the body. Supplies needed You will need an oral rehydration solution (ORS) if your health care provider tells you to use one. This is a drink to treat   dehydration. It can be found in pharmacies and retail stores. How to rehydrate Fluids Follow instructions from your health care provider for rehydration. The kind of fluid and the amount you should drink depend on your condition. In general, you should choose drinks that you prefer.  If told by your health care provider, drink an ORS. ? Make an ORS by following instructions on the package. ? Start by drinking small amounts, about  cup (120 mL) every 5-10 minutes. ? Slowly increase how much you drink until you have taken the amount recommended by your health care provider.  Drink enough clear fluids to keep your urine pale yellow. If you were told to  drink an ORS, finish it first, then start slowly drinking other clear fluids. Drink fluids such as: ? Water. This includes sparkling water and flavored water. Drinking only water can lead to having too little sodium in your body (hyponatremia). Follow the advice of your health care provider. ? Water from ice chips you suck on. ? Fruit juice with water you add to it (diluted). ? Sports drinks. ? Hot or cold herbal teas. ? Broth-based soups. ? Milk or milk products. Food Follow instructions from your health care provider about what to eat while you rehydrate. Your health care provider may recommend that you slowly begin eating regular foods in small amounts.  Eat foods that contain a healthy balance of electrolytes, such as bananas, oranges, potatoes, tomatoes, and spinach.  Avoid foods that are greasy or contain a lot of sugar. In some cases, you may get nutrition through a feeding tube that is passed through your nose and into your stomach (nasogastric tube, or NG tube). This may be done if you have uncontrolled vomiting or diarrhea.   Beverages to avoid Certain beverages may make dehydration worse. While you rehydrate, avoid drinking alcohol.   How to tell if you are recovering from dehydration You may be recovering from dehydration if:  You are urinating more often than before you started rehydrating.  Your urine is pale yellow.  Your energy level improves.  You vomit less frequently.  You have diarrhea less frequently.  Your appetite improves or returns to normal.  You feel less dizzy or less light-headed.  Your skin tone and color start to look more normal. Follow these instructions at home:  Take over-the-counter and prescription medicines only as told by your health care provider.  Do not take sodium tablets. Doing this can lead to having too much sodium in your body (hypernatremia). Contact a health care provider if:  You continue to have symptoms of mild or moderate  dehydration, such as: ? Thirst. ? Dry lips. ? Slightly dry mouth. ? Dizziness. ? Dark urine or less urine than normal. ? Muscle cramps.  You continue to vomit or have diarrhea. Get help right away if you:  Have symptoms of dehydration that get worse.  Have a fever.  Have a severe headache.  Have been vomiting and the following happens: ? Your vomiting gets worse or does not go away. ? Your vomit includes blood or green matter (bile). ? You cannot eat or drink without vomiting.  Have problems with urination or bowel movements, such as: ? Diarrhea that gets worse or does not go away. ? Blood in your stool (feces). This may cause stool to look black and tarry. ? Not urinating, or urinating only a small amount of very dark urine, within 6-8 hours.  Have trouble breathing.  Have symptoms that get worse   with treatment. These symptoms may represent a serious problem that is an emergency. Do not wait to see if the symptoms will go away. Get medical help right away. Call your local emergency services (911 in the [place] Do not drive yourself to the hospital. Summary  Rehydration is the replacement of body fluids and minerals (electrolytes) that are lost during dehydration.  Follow instructions from your health care provider for rehydration. The kind of fluid and amount you should drink depend on your condition.  Slowly increase how much you drink until you have taken the amount recommended by your health care provider.  Contact your health care provider if you continue to show signs of mild or moderate dehydration. This information is not intended to replace advice given to you by your health care provider. Make sure you discuss any questions you have with your health care provider. Document Revised: 12/04/2019 Document Reviewed: 10/14/2019 Elsevier Patient Education  2021 [employer].  

## 2020-11-11 ENCOUNTER — Inpatient Hospital Stay: Payer: Medicare Other

## 2020-11-13 ENCOUNTER — Other Ambulatory Visit: Payer: Self-pay | Admitting: Hematology

## 2020-11-13 DIAGNOSIS — C9000 Multiple myeloma not having achieved remission: Secondary | ICD-10-CM

## 2020-11-13 DIAGNOSIS — Z7189 Other specified counseling: Secondary | ICD-10-CM

## 2020-11-17 DIAGNOSIS — Z7952 Long term (current) use of systemic steroids: Secondary | ICD-10-CM | POA: Diagnosis not present

## 2020-11-17 DIAGNOSIS — E1142 Type 2 diabetes mellitus with diabetic polyneuropathy: Secondary | ICD-10-CM | POA: Diagnosis not present

## 2020-11-17 DIAGNOSIS — K8020 Calculus of gallbladder without cholecystitis without obstruction: Secondary | ICD-10-CM | POA: Diagnosis not present

## 2020-11-17 DIAGNOSIS — C9002 Multiple myeloma in relapse: Secondary | ICD-10-CM | POA: Diagnosis not present

## 2020-11-17 DIAGNOSIS — C9000 Multiple myeloma not having achieved remission: Secondary | ICD-10-CM | POA: Diagnosis not present

## 2020-11-17 DIAGNOSIS — I700 Atherosclerosis of aorta: Secondary | ICD-10-CM | POA: Diagnosis not present

## 2020-11-18 DIAGNOSIS — M25511 Pain in right shoulder: Secondary | ICD-10-CM | POA: Diagnosis not present

## 2020-11-26 DIAGNOSIS — C9002 Multiple myeloma in relapse: Secondary | ICD-10-CM | POA: Diagnosis not present

## 2020-11-26 DIAGNOSIS — G629 Polyneuropathy, unspecified: Secondary | ICD-10-CM | POA: Diagnosis not present

## 2020-11-26 DIAGNOSIS — M79603 Pain in arm, unspecified: Secondary | ICD-10-CM | POA: Diagnosis not present

## 2020-11-26 DIAGNOSIS — M549 Dorsalgia, unspecified: Secondary | ICD-10-CM | POA: Diagnosis not present

## 2020-11-26 DIAGNOSIS — M545 Low back pain, unspecified: Secondary | ICD-10-CM | POA: Diagnosis not present

## 2020-11-26 DIAGNOSIS — C9000 Multiple myeloma not having achieved remission: Secondary | ICD-10-CM | POA: Diagnosis not present

## 2020-11-26 DIAGNOSIS — G9009 Other idiopathic peripheral autonomic neuropathy: Secondary | ICD-10-CM | POA: Diagnosis not present

## 2020-11-26 DIAGNOSIS — R0782 Intercostal pain: Secondary | ICD-10-CM | POA: Diagnosis not present

## 2020-12-01 DIAGNOSIS — Z79899 Other long term (current) drug therapy: Secondary | ICD-10-CM | POA: Diagnosis not present

## 2020-12-01 DIAGNOSIS — Z923 Personal history of irradiation: Secondary | ICD-10-CM | POA: Diagnosis not present

## 2020-12-01 DIAGNOSIS — Z7952 Long term (current) use of systemic steroids: Secondary | ICD-10-CM | POA: Diagnosis not present

## 2020-12-01 DIAGNOSIS — R0781 Pleurodynia: Secondary | ICD-10-CM | POA: Diagnosis not present

## 2020-12-01 DIAGNOSIS — Z7982 Long term (current) use of aspirin: Secondary | ICD-10-CM | POA: Diagnosis not present

## 2020-12-01 DIAGNOSIS — M549 Dorsalgia, unspecified: Secondary | ICD-10-CM | POA: Diagnosis not present

## 2020-12-01 DIAGNOSIS — G629 Polyneuropathy, unspecified: Secondary | ICD-10-CM | POA: Diagnosis not present

## 2020-12-01 DIAGNOSIS — M25511 Pain in right shoulder: Secondary | ICD-10-CM | POA: Diagnosis not present

## 2020-12-01 DIAGNOSIS — C9002 Multiple myeloma in relapse: Secondary | ICD-10-CM | POA: Diagnosis not present

## 2020-12-03 ENCOUNTER — Other Ambulatory Visit: Payer: Self-pay | Admitting: *Deleted

## 2020-12-03 DIAGNOSIS — C9002 Multiple myeloma in relapse: Secondary | ICD-10-CM

## 2020-12-03 MED ORDER — ERGOCALCIFEROL 1.25 MG (50000 UT) PO CAPS: 50000.0000 [IU] | ORAL_CAPSULE | ORAL | 3 refills | Status: AC

## 2020-12-03 NOTE — Telephone Encounter (Signed)
Received faxed refill request for 90 day supply of  Vit D2 1.25 mg, 50,000 Units from [facility]  Medication refilled per Dr. Irene Limbo office visit note 10/23/20 to continue twice weekly dose Vit D2 50,000

## 2020-12-07 DIAGNOSIS — C9002 Multiple myeloma in relapse: Secondary | ICD-10-CM | POA: Diagnosis not present

## 2020-12-07 DIAGNOSIS — Z006 Encounter for examination for normal comparison and control in clinical research program: Secondary | ICD-10-CM | POA: Diagnosis not present

## 2020-12-07 DIAGNOSIS — G9009 Other idiopathic peripheral autonomic neuropathy: Secondary | ICD-10-CM | POA: Diagnosis not present

## 2020-12-09 DIAGNOSIS — M7918 Myalgia, other site: Secondary | ICD-10-CM | POA: Diagnosis not present

## 2020-12-09 DIAGNOSIS — G893 Neoplasm related pain (acute) (chronic): Secondary | ICD-10-CM | POA: Diagnosis not present

## 2020-12-09 DIAGNOSIS — I4891 Unspecified atrial fibrillation: Secondary | ICD-10-CM | POA: Diagnosis not present

## 2020-12-09 DIAGNOSIS — C9002 Multiple myeloma in relapse: Secondary | ICD-10-CM | POA: Diagnosis not present

## 2020-12-09 DIAGNOSIS — Z515 Encounter for palliative care: Secondary | ICD-10-CM | POA: Diagnosis not present

## 2020-12-10 DIAGNOSIS — E785 Hyperlipidemia, unspecified: Secondary | ICD-10-CM | POA: Diagnosis not present

## 2020-12-10 DIAGNOSIS — C9002 Multiple myeloma in relapse: Secondary | ICD-10-CM | POA: Diagnosis not present

## 2020-12-10 DIAGNOSIS — I459 Conduction disorder, unspecified: Secondary | ICD-10-CM | POA: Diagnosis not present

## 2020-12-10 DIAGNOSIS — I4891 Unspecified atrial fibrillation: Secondary | ICD-10-CM | POA: Diagnosis not present

## 2020-12-10 DIAGNOSIS — I517 Cardiomegaly: Secondary | ICD-10-CM | POA: Diagnosis not present

## 2020-12-10 DIAGNOSIS — I482 Chronic atrial fibrillation, unspecified: Secondary | ICD-10-CM | POA: Diagnosis not present

## 2020-12-10 DIAGNOSIS — K219 Gastro-esophageal reflux disease without esophagitis: Secondary | ICD-10-CM | POA: Diagnosis not present

## 2020-12-11 DIAGNOSIS — G893 Neoplasm related pain (acute) (chronic): Secondary | ICD-10-CM | POA: Diagnosis not present

## 2020-12-11 DIAGNOSIS — C9002 Multiple myeloma in relapse: Secondary | ICD-10-CM | POA: Diagnosis not present

## 2020-12-11 DIAGNOSIS — R1313 Dysphagia, pharyngeal phase: Secondary | ICD-10-CM | POA: Diagnosis not present

## 2020-12-11 DIAGNOSIS — Z515 Encounter for palliative care: Secondary | ICD-10-CM | POA: Diagnosis not present

## 2020-12-15 DIAGNOSIS — Z515 Encounter for palliative care: Secondary | ICD-10-CM | POA: Diagnosis not present

## 2020-12-15 DIAGNOSIS — G893 Neoplasm related pain (acute) (chronic): Secondary | ICD-10-CM | POA: Diagnosis not present

## 2020-12-15 DIAGNOSIS — C9002 Multiple myeloma in relapse: Secondary | ICD-10-CM | POA: Diagnosis not present

## 2020-12-15 DIAGNOSIS — R1313 Dysphagia, pharyngeal phase: Secondary | ICD-10-CM | POA: Diagnosis not present

## 2020-12-17 DIAGNOSIS — M6281 Muscle weakness (generalized): Secondary | ICD-10-CM | POA: Diagnosis not present

## 2020-12-17 DIAGNOSIS — D61810 Antineoplastic chemotherapy induced pancytopenia: Secondary | ICD-10-CM | POA: Diagnosis not present

## 2020-12-17 DIAGNOSIS — Z7901 Long term (current) use of anticoagulants: Secondary | ICD-10-CM | POA: Diagnosis not present

## 2020-12-17 DIAGNOSIS — C9002 Multiple myeloma in relapse: Secondary | ICD-10-CM | POA: Diagnosis not present

## 2020-12-17 DIAGNOSIS — I4891 Unspecified atrial fibrillation: Secondary | ICD-10-CM | POA: Diagnosis not present

## 2020-12-17 DIAGNOSIS — G609 Hereditary and idiopathic neuropathy, unspecified: Secondary | ICD-10-CM | POA: Diagnosis not present

## 2020-12-17 DIAGNOSIS — E785 Hyperlipidemia, unspecified: Secondary | ICD-10-CM | POA: Diagnosis not present

## 2020-12-23 DIAGNOSIS — I459 Conduction disorder, unspecified: Secondary | ICD-10-CM | POA: Diagnosis not present

## 2020-12-23 DIAGNOSIS — I517 Cardiomegaly: Secondary | ICD-10-CM | POA: Diagnosis not present

## 2020-12-24 DIAGNOSIS — M6281 Muscle weakness (generalized): Secondary | ICD-10-CM | POA: Diagnosis not present

## 2020-12-24 DIAGNOSIS — I4891 Unspecified atrial fibrillation: Secondary | ICD-10-CM | POA: Diagnosis not present

## 2020-12-24 DIAGNOSIS — G609 Hereditary and idiopathic neuropathy, unspecified: Secondary | ICD-10-CM | POA: Diagnosis not present

## 2020-12-24 DIAGNOSIS — C9002 Multiple myeloma in relapse: Secondary | ICD-10-CM | POA: Diagnosis not present

## 2020-12-24 DIAGNOSIS — E785 Hyperlipidemia, unspecified: Secondary | ICD-10-CM | POA: Diagnosis not present

## 2020-12-24 DIAGNOSIS — D61810 Antineoplastic chemotherapy induced pancytopenia: Secondary | ICD-10-CM | POA: Diagnosis not present

## 2020-12-27 DIAGNOSIS — R059 Cough, unspecified: Secondary | ICD-10-CM | POA: Diagnosis not present

## 2020-12-27 DIAGNOSIS — H409 Unspecified glaucoma: Secondary | ICD-10-CM | POA: Diagnosis not present

## 2020-12-27 DIAGNOSIS — D61811 Other drug-induced pancytopenia: Secondary | ICD-10-CM | POA: Diagnosis not present

## 2020-12-27 DIAGNOSIS — K5669 Other partial intestinal obstruction: Secondary | ICD-10-CM | POA: Diagnosis not present

## 2020-12-27 DIAGNOSIS — Z006 Encounter for examination for normal comparison and control in clinical research program: Secondary | ICD-10-CM | POA: Diagnosis not present

## 2020-12-27 DIAGNOSIS — Z86718 Personal history of other venous thrombosis and embolism: Secondary | ICD-10-CM | POA: Diagnosis not present

## 2020-12-27 DIAGNOSIS — E860 Dehydration: Secondary | ICD-10-CM | POA: Diagnosis present

## 2020-12-27 DIAGNOSIS — R0781 Pleurodynia: Secondary | ICD-10-CM | POA: Diagnosis not present

## 2020-12-27 DIAGNOSIS — E639 Nutritional deficiency, unspecified: Secondary | ICD-10-CM | POA: Diagnosis not present

## 2020-12-27 DIAGNOSIS — D649 Anemia, unspecified: Secondary | ICD-10-CM | POA: Diagnosis not present

## 2020-12-27 DIAGNOSIS — G893 Neoplasm related pain (acute) (chronic): Secondary | ICD-10-CM | POA: Diagnosis not present

## 2020-12-27 DIAGNOSIS — B370 Candidal stomatitis: Secondary | ICD-10-CM | POA: Diagnosis not present

## 2020-12-27 DIAGNOSIS — M549 Dorsalgia, unspecified: Secondary | ICD-10-CM | POA: Diagnosis not present

## 2020-12-27 DIAGNOSIS — E8770 Fluid overload, unspecified: Secondary | ICD-10-CM | POA: Diagnosis present

## 2020-12-27 DIAGNOSIS — R531 Weakness: Secondary | ICD-10-CM | POA: Diagnosis not present

## 2020-12-27 DIAGNOSIS — K219 Gastro-esophageal reflux disease without esophagitis: Secondary | ICD-10-CM | POA: Diagnosis present

## 2020-12-27 DIAGNOSIS — R279 Unspecified lack of coordination: Secondary | ICD-10-CM | POA: Diagnosis not present

## 2020-12-27 DIAGNOSIS — I10 Essential (primary) hypertension: Secondary | ICD-10-CM | POA: Diagnosis not present

## 2020-12-27 DIAGNOSIS — Z4659 Encounter for fitting and adjustment of other gastrointestinal appliance and device: Secondary | ICD-10-CM | POA: Diagnosis not present

## 2020-12-27 DIAGNOSIS — G9341 Metabolic encephalopathy: Secondary | ICD-10-CM | POA: Diagnosis present

## 2020-12-27 DIAGNOSIS — G9589 Other specified diseases of spinal cord: Secondary | ICD-10-CM | POA: Diagnosis not present

## 2020-12-27 DIAGNOSIS — C9000 Multiple myeloma not having achieved remission: Secondary | ICD-10-CM | POA: Diagnosis not present

## 2020-12-27 DIAGNOSIS — R0902 Hypoxemia: Secondary | ICD-10-CM | POA: Diagnosis not present

## 2020-12-27 DIAGNOSIS — I459 Conduction disorder, unspecified: Secondary | ICD-10-CM | POA: Diagnosis not present

## 2020-12-27 DIAGNOSIS — E7220 Disorder of urea cycle metabolism, unspecified: Secondary | ICD-10-CM | POA: Diagnosis not present

## 2020-12-27 DIAGNOSIS — R5381 Other malaise: Secondary | ICD-10-CM | POA: Diagnosis not present

## 2020-12-27 DIAGNOSIS — C9002 Multiple myeloma in relapse: Secondary | ICD-10-CM | POA: Diagnosis present

## 2020-12-27 DIAGNOSIS — Z743 Need for continuous supervision: Secondary | ICD-10-CM | POA: Diagnosis not present

## 2020-12-27 DIAGNOSIS — G9340 Encephalopathy, unspecified: Secondary | ICD-10-CM | POA: Diagnosis not present

## 2020-12-27 DIAGNOSIS — G629 Polyneuropathy, unspecified: Secondary | ICD-10-CM | POA: Diagnosis not present

## 2020-12-27 DIAGNOSIS — Z79899 Other long term (current) drug therapy: Secondary | ICD-10-CM | POA: Diagnosis not present

## 2020-12-27 DIAGNOSIS — R4789 Other speech disturbances: Secondary | ICD-10-CM | POA: Diagnosis not present

## 2020-12-27 DIAGNOSIS — R4182 Altered mental status, unspecified: Secondary | ICD-10-CM | POA: Diagnosis not present

## 2020-12-27 DIAGNOSIS — K8020 Calculus of gallbladder without cholecystitis without obstruction: Secondary | ICD-10-CM | POA: Diagnosis not present

## 2020-12-27 DIAGNOSIS — K5900 Constipation, unspecified: Secondary | ICD-10-CM | POA: Diagnosis not present

## 2020-12-27 DIAGNOSIS — R000 Tachycardia, unspecified: Secondary | ICD-10-CM | POA: Diagnosis not present

## 2020-12-27 DIAGNOSIS — K56609 Unspecified intestinal obstruction, unspecified as to partial versus complete obstruction: Secondary | ICD-10-CM | POA: Diagnosis not present

## 2020-12-27 DIAGNOSIS — Z515 Encounter for palliative care: Secondary | ICD-10-CM | POA: Diagnosis not present

## 2020-12-27 DIAGNOSIS — E785 Hyperlipidemia, unspecified: Secondary | ICD-10-CM | POA: Diagnosis present

## 2020-12-27 DIAGNOSIS — R1313 Dysphagia, pharyngeal phase: Secondary | ICD-10-CM | POA: Diagnosis not present

## 2020-12-27 DIAGNOSIS — Z7901 Long term (current) use of anticoagulants: Secondary | ICD-10-CM | POA: Diagnosis not present

## 2020-12-27 DIAGNOSIS — B379 Candidiasis, unspecified: Secondary | ICD-10-CM | POA: Diagnosis present

## 2020-12-27 DIAGNOSIS — M8448XA Pathological fracture, other site, initial encounter for fracture: Secondary | ICD-10-CM | POA: Diagnosis not present

## 2020-12-27 DIAGNOSIS — G4733 Obstructive sleep apnea (adult) (pediatric): Secondary | ICD-10-CM | POA: Diagnosis not present

## 2020-12-27 DIAGNOSIS — D61818 Other pancytopenia: Secondary | ICD-10-CM | POA: Diagnosis present

## 2020-12-27 DIAGNOSIS — M8458XA Pathological fracture in neoplastic disease, other specified site, initial encounter for fracture: Secondary | ICD-10-CM | POA: Diagnosis not present

## 2020-12-27 DIAGNOSIS — Z6832 Body mass index (BMI) 32.0-32.9, adult: Secondary | ICD-10-CM | POA: Diagnosis not present

## 2020-12-27 DIAGNOSIS — R1110 Vomiting, unspecified: Secondary | ICD-10-CM | POA: Diagnosis not present

## 2020-12-27 DIAGNOSIS — M898X8 Other specified disorders of bone, other site: Secondary | ICD-10-CM | POA: Diagnosis not present

## 2020-12-27 DIAGNOSIS — E222 Syndrome of inappropriate secretion of antidiuretic hormone: Secondary | ICD-10-CM | POA: Diagnosis present

## 2020-12-27 DIAGNOSIS — T451X5A Adverse effect of antineoplastic and immunosuppressive drugs, initial encounter: Secondary | ICD-10-CM | POA: Diagnosis not present

## 2020-12-27 DIAGNOSIS — D61810 Antineoplastic chemotherapy induced pancytopenia: Secondary | ICD-10-CM | POA: Diagnosis not present

## 2020-12-27 DIAGNOSIS — T8082XA Complication of immune effector cellular therapy, initial encounter: Secondary | ICD-10-CM | POA: Diagnosis present

## 2020-12-27 DIAGNOSIS — Z66 Do not resuscitate: Secondary | ICD-10-CM | POA: Diagnosis not present

## 2020-12-27 DIAGNOSIS — K922 Gastrointestinal hemorrhage, unspecified: Secondary | ICD-10-CM | POA: Diagnosis not present

## 2020-12-27 DIAGNOSIS — R143 Flatulence: Secondary | ICD-10-CM | POA: Diagnosis not present

## 2020-12-27 DIAGNOSIS — K565 Intestinal adhesions [bands], unspecified as to partial versus complete obstruction: Secondary | ICD-10-CM | POA: Diagnosis not present

## 2020-12-27 DIAGNOSIS — E669 Obesity, unspecified: Secondary | ICD-10-CM | POA: Diagnosis not present

## 2020-12-27 DIAGNOSIS — R140 Abdominal distension (gaseous): Secondary | ICD-10-CM | POA: Diagnosis not present

## 2020-12-27 DIAGNOSIS — I4891 Unspecified atrial fibrillation: Secondary | ICD-10-CM | POA: Diagnosis not present

## 2020-12-27 DIAGNOSIS — E871 Hypo-osmolality and hyponatremia: Secondary | ICD-10-CM | POA: Diagnosis not present

## 2020-12-27 DIAGNOSIS — G9201 Immune effector cell-associated neurotoxicity syndrome, grade 1: Secondary | ICD-10-CM | POA: Diagnosis present

## 2020-12-27 DIAGNOSIS — K921 Melena: Secondary | ICD-10-CM | POA: Diagnosis not present

## 2020-12-27 DIAGNOSIS — R109 Unspecified abdominal pain: Secondary | ICD-10-CM | POA: Diagnosis not present

## 2020-12-27 DIAGNOSIS — Z20822 Contact with and (suspected) exposure to covid-19: Secondary | ICD-10-CM | POA: Diagnosis present

## 2020-12-27 DIAGNOSIS — R0789 Other chest pain: Secondary | ICD-10-CM | POA: Diagnosis not present

## 2020-12-27 DIAGNOSIS — R633 Feeding difficulties, unspecified: Secondary | ICD-10-CM | POA: Diagnosis not present

## 2020-12-27 DIAGNOSIS — K56600 Partial intestinal obstruction, unspecified as to cause: Secondary | ICD-10-CM | POA: Diagnosis not present

## 2020-12-27 DIAGNOSIS — Z792 Long term (current) use of antibiotics: Secondary | ICD-10-CM | POA: Diagnosis not present

## 2020-12-27 DIAGNOSIS — K6389 Other specified diseases of intestine: Secondary | ICD-10-CM | POA: Diagnosis not present

## 2020-12-27 DIAGNOSIS — M79603 Pain in arm, unspecified: Secondary | ICD-10-CM | POA: Diagnosis not present

## 2020-12-27 DIAGNOSIS — I517 Cardiomegaly: Secondary | ICD-10-CM | POA: Diagnosis not present

## 2020-12-27 DIAGNOSIS — R627 Adult failure to thrive: Secondary | ICD-10-CM | POA: Diagnosis present

## 2020-12-27 DIAGNOSIS — R630 Anorexia: Secondary | ICD-10-CM | POA: Diagnosis not present

## 2020-12-27 DIAGNOSIS — I482 Chronic atrial fibrillation, unspecified: Secondary | ICD-10-CM | POA: Diagnosis present

## 2020-12-31 ENCOUNTER — Telehealth: Payer: Self-pay | Admitting: Radiation Oncology

## 2020-12-31 NOTE — Telephone Encounter (Signed)
Michael [name] called from [facility] requesting radiation records from [place] (Dr. Lurline Hare @ [facility]). She states that the patient is admitted there and does not have a signed release from them. She is faxing the request to [place]. I did let her know that we would need a signed release.

## 2021-01-01 ENCOUNTER — Telehealth: Payer: Self-pay | Admitting: Radiation Oncology

## 2021-01-01 NOTE — Telephone Encounter (Signed)
I clarified with HIM and was told that if it is for treatment, then we do not need a signed release. Called Hassan Rowan back at [facility] and was told that the records req. Was cancelled yesterday.

## 2021-02-12 NOTE — Progress Notes (Signed)
Received a call from pt's wife. Wife reported pt expired on 03/11/21.

## 2021-02-14 DEATH — deceased

## 2023-09-06 NOTE — Telephone Encounter (Signed)
Telephone call  

## 2023-09-07 NOTE — Telephone Encounter (Signed)
Telephone call
